# Patient Record
Sex: Male | Born: 1945
Health system: Southern US, Community
[De-identification: ages and names within clinical notes are randomized; demographics above are authoritative.]

## PROBLEM LIST (undated history)

## (undated) DIAGNOSIS — J84112 Idiopathic pulmonary fibrosis: Secondary | ICD-10-CM

## (undated) DIAGNOSIS — E785 Hyperlipidemia, unspecified: Secondary | ICD-10-CM

## (undated) DIAGNOSIS — Z8619 Personal history of other infectious and parasitic diseases: Secondary | ICD-10-CM

## (undated) DIAGNOSIS — I10 Essential (primary) hypertension: Secondary | ICD-10-CM

## (undated) DIAGNOSIS — M1990 Unspecified osteoarthritis, unspecified site: Secondary | ICD-10-CM

## (undated) DIAGNOSIS — M810 Age-related osteoporosis without current pathological fracture: Secondary | ICD-10-CM

## (undated) DIAGNOSIS — T7840XA Allergy, unspecified, initial encounter: Secondary | ICD-10-CM

## (undated) DIAGNOSIS — K219 Gastro-esophageal reflux disease without esophagitis: Secondary | ICD-10-CM

## (undated) DIAGNOSIS — E1165 Type 2 diabetes mellitus with hyperglycemia: Secondary | ICD-10-CM

## (undated) DIAGNOSIS — Z794 Long term (current) use of insulin: Principal | ICD-10-CM

## (undated) DIAGNOSIS — I219 Acute myocardial infarction, unspecified: Secondary | ICD-10-CM

## (undated) DIAGNOSIS — E079 Disorder of thyroid, unspecified: Secondary | ICD-10-CM

## (undated) HISTORY — DX: Type 2 diabetes mellitus with hyperglycemia: E11.65

## (undated) HISTORY — DX: Allergy, unspecified, initial encounter: T78.40XA

## (undated) HISTORY — DX: Personal history of other infectious and parasitic diseases: Z86.19

## (undated) HISTORY — DX: Unspecified osteoarthritis, unspecified site: M19.90

## (undated) HISTORY — DX: Essential (primary) hypertension: I10

## (undated) HISTORY — DX: Hyperlipidemia, unspecified: E78.5

## (undated) HISTORY — DX: Age-related osteoporosis without current pathological fracture: M81.0

## (undated) HISTORY — DX: Idiopathic pulmonary fibrosis: J84.112

## (undated) HISTORY — DX: Gastro-esophageal reflux disease without esophagitis: K21.9

## (undated) HISTORY — DX: Acute myocardial infarction, unspecified: I21.9

## (undated) HISTORY — DX: Long term (current) use of insulin: Z79.4

---

## 1988-08-09 HISTORY — PX: SHOULDER SURGERY: SHX246

## 1990-08-09 LAB — HM COLONOSCOPY: HM Colonoscopy: NORMAL

## 1996-08-09 HISTORY — PX: EYE SURGERY: SHX253

## 2003-12-03 ENCOUNTER — Encounter: Admission: RE | Admit: 2003-12-03 | Discharge: 2004-03-02 | Payer: Self-pay | Admitting: Endocrinology

## 2004-06-08 ENCOUNTER — Ambulatory Visit: Payer: Self-pay | Admitting: Endocrinology

## 2004-09-04 ENCOUNTER — Ambulatory Visit: Payer: Self-pay | Admitting: Endocrinology

## 2004-09-08 ENCOUNTER — Ambulatory Visit: Payer: Self-pay | Admitting: Endocrinology

## 2010-04-03 ENCOUNTER — Encounter: Payer: Self-pay | Admitting: Internal Medicine

## 2010-09-08 NOTE — Letter (Signed)
Summary: Colonoscopy Letter  [facility]  [address] [place], [place] [address]   Phone: [phone]  Fax: [phone]      April 03, 2010 MRN: [id number]   KAYEN GRABEL [address] [place], [place]  [address]   Dear Mr. SWAMY,   According to your medical record, it is time for you to schedule a Colonoscopy. The American Cancer Society recommends this procedure as a method to detect early colon cancer. Patients with a family history of colon cancer, or a personal history of colon polyps or inflammatory bowel disease are at increased risk.  This letter has been generated based on the recommendations made at the time of your procedure. If you feel that in your particular situation this may no longer apply, please contact our office.  Please call our office at [phone] to schedule this appointment or to update your records at your earliest convenience.  Thank you for cooperating with [place] to provide you with the very best care possible.   Sincerely,  Wilhemina Bonito. Marina Goodell, M.D.  [facility] [phone]

## 2011-01-27 ENCOUNTER — Emergency Department (HOSPITAL_BASED_OUTPATIENT_CLINIC_OR_DEPARTMENT_OTHER)
Admission: EM | Admit: 2011-01-27 | Discharge: 2011-01-27 | Disposition: A | Discharge status: Home / Self Care | Payer: Self-pay | Attending: Emergency Medicine | Admitting: Emergency Medicine

## 2011-01-27 DIAGNOSIS — Z79899 Other long term (current) drug therapy: Secondary | ICD-10-CM | POA: Insufficient documentation

## 2011-01-27 DIAGNOSIS — E7800 Pure hypercholesterolemia, unspecified: Secondary | ICD-10-CM | POA: Insufficient documentation

## 2011-01-27 DIAGNOSIS — I10 Essential (primary) hypertension: Secondary | ICD-10-CM | POA: Insufficient documentation

## 2011-01-27 DIAGNOSIS — E119 Type 2 diabetes mellitus without complications: Secondary | ICD-10-CM | POA: Insufficient documentation

## 2011-01-27 DIAGNOSIS — Z760 Encounter for issue of repeat prescription: Secondary | ICD-10-CM | POA: Insufficient documentation

## 2011-02-16 ENCOUNTER — Ambulatory Visit: Payer: Self-pay | Admitting: Family

## 2011-02-22 ENCOUNTER — Ambulatory Visit (INDEPENDENT_AMBULATORY_CARE_PROVIDER_SITE_OTHER): Payer: Self-pay | Admitting: Family

## 2011-02-22 ENCOUNTER — Encounter: Payer: Self-pay | Admitting: Family

## 2011-02-22 VITALS — BP 140/90 | HR 60 | Temp 98.1°F | Resp 16 | Ht 69.0 in | Wt 176.0 lb

## 2011-02-22 DIAGNOSIS — J309 Allergic rhinitis, unspecified: Secondary | ICD-10-CM | POA: Insufficient documentation

## 2011-02-22 DIAGNOSIS — E119 Type 2 diabetes mellitus without complications: Secondary | ICD-10-CM

## 2011-02-22 DIAGNOSIS — I10 Essential (primary) hypertension: Secondary | ICD-10-CM | POA: Insufficient documentation

## 2011-02-22 DIAGNOSIS — E785 Hyperlipidemia, unspecified: Secondary | ICD-10-CM | POA: Insufficient documentation

## 2011-02-22 DIAGNOSIS — K219 Gastro-esophageal reflux disease without esophagitis: Secondary | ICD-10-CM | POA: Insufficient documentation

## 2011-02-22 NOTE — Assessment & Plan Note (Signed)
Overall this has improved for pt.  I suspect that it is due to his weight loss.

## 2011-02-22 NOTE — Patient Instructions (Addendum)
Complete your lab work on the first floor.  Follow up in 3 months for your welcome to Medicare Physical. Come fasting to this appointment.

## 2011-02-22 NOTE — Progress Notes (Signed)
Subjective:    Patient ID: Ryan [name], male    DOB: 10-Aug-1945, 65 y.o.   MRN: [id number]  HPI  Ryan [name] is a 65 yr old male who presents today to establish care.    1. DM2- Was following at the [place]. Was diagnosed about 12 yrs ago.  Reports that he has a family history of DM.  He reports that he checks his sugars frequently.  Fasting sugars often in the 200's.  Often 80-120 during the day.  He reports that his A1C was in the 7's in December.  Lantus 12 units AM and 12 units PM, he counts carbs 1 unit for 15 carbs. He reports that he had an episode of symptomatic hypoglycemia-  While mowing the lawn.  Last eye exam was 9/11, reports that this was negative for retinopathy.  He reports that his pneumovax is up to date.  Gets flu shot. Exercises regularly  2. Dizziness- notes that this happens when he tries to stand up quickly.  3. GERD-Reports that he was on a med for a while.  Improved since he lost weight. Reports + hx of hiatal hernia. Asymptomic.  4.  Seasonal allergies- mild, not bothering him.  Notes that this is worse at night.  Worse in October.  5. Hyperlipidemia- on simvastatin- report that his LDL was not at goal.      Review of Systems  Constitutional: Negative for fever.  HENT: Positive for rhinorrhea.   Eyes: Negative for visual disturbance.  Respiratory: Negative for shortness of breath.   Cardiovascular: Negative for chest pain, palpitations and leg swelling.  Gastrointestinal: Negative for nausea, vomiting and diarrhea.  Genitourinary: Positive for frequency. Negative for dysuria.  Musculoskeletal: Negative for myalgias and arthralgias.       Reports + arthritis.  Skin: Negative for rash.  Neurological: Negative for weakness and numbness.  Hematological: Negative for adenopathy.  Psychiatric/Behavioral:       Denies hx of depression, anxiety   Past Medical History  Diagnosis Date  . Diabetes mellitus   . History of chicken pox   . GERD (gastroesophageal reflux  disease)   . Allergy   . Hyperlipidemia   . Hypertension     History   Social History  . Marital Status: Married    Spouse Name: N/A    Number of Children: 1  . Years of Education: N/A   Occupational History  . Not on file.   Social History Main Topics  . Smoking status: Never Smoker   . Smokeless tobacco: Never Used  . Alcohol Use: No  . Drug Use: No  . Sexually Active: Not on file   Other Topics Concern  . Not on file   Social History Narrative   Regular exercise:  5 x weeklyCaffeine Use: 1 cups coffee daily.Retired from Librarian, academic.Married- One son and grandson- both live with themWife has ovarian cancer    Past Surgical History  Procedure Date  . Shoulder surgery 1990    right shoulder, torn rotator cuff.  . Eye surgery 1998    bilateral    Family History  Problem Relation Age of Onset  . Cancer Mother     cancer  . Heart disease Mother   . Hyperlipidemia Mother   . Hypertension Mother   . Hyperlipidemia Father   . Stroke Father   . Hypertension Father   . Diabetes Paternal Grandfather     No Known Allergies  No current outpatient prescriptions on file prior to visit.  BP 140/90  Pulse 60  Temp(Src) 98.1 F (36.7 C) (Oral)  Resp 16  Ht 5' 9"$  (1.753 m)  Wt 176 lb (79.833 kg)  BMI 25.99 kg/m2       Objective:   Physical Exam  Constitutional: He appears well-developed and well-nourished.  HENT:  Head: Normocephalic and atraumatic.  Eyes: Pupils are equal, round, and reactive to light.  Neck: Normal range of motion. Neck supple. No thyromegaly present.  Cardiovascular: Normal rate and regular rhythm.   Pulmonary/Chest: Effort normal and breath sounds normal.  Abdominal: Soft. Bowel sounds are normal. He exhibits no distension and no mass. There is no tenderness. There is no rebound and no guarding.  Musculoskeletal: He exhibits no edema.  Skin: Skin is warm and dry.  Psychiatric: He has a normal mood and affect. His behavior is  normal. Judgment and thought content normal.          Assessment & Plan:

## 2011-02-22 NOTE — Assessment & Plan Note (Signed)
He reports that his symptoms are the worst in October.  Recommended to the patient that he start Claritin once daily in the end of September to see if this helps.

## 2011-02-22 NOTE — Assessment & Plan Note (Signed)
On ACE. Not at goal.  He reports that his BP at home is generally 120 or less systolic.  Will continue current meds and repeat next visit.

## 2011-02-22 NOTE — Assessment & Plan Note (Signed)
Seems well controlled at this point.  I have ordered a BMET and A1C and given slips to the patient. He will check cost and try to have these drawn soon. He is currently uninsured, but plans to be on Medicare in October.

## 2011-03-01 ENCOUNTER — Telehealth: Payer: Self-pay | Admitting: Family

## 2011-03-01 NOTE — Telephone Encounter (Signed)
Notified pt's wife and she states she will bring pt to the lab this week.

## 2011-03-01 NOTE — Telephone Encounter (Signed)
Pls call pt and remind him to complete lab work on the first floor at his earliest convenience.

## 2011-03-03 LAB — BASIC METABOLIC PANEL
BUN: 19 mg/dL (ref 6–23)
CO2: 25 mEq/L (ref 19–32)
Calcium: 9.7 mg/dL (ref 8.4–10.5)
Chloride: 101 mEq/L (ref 96–112)
Creat: 1.04 mg/dL (ref 0.50–1.35)
Glucose, Bld: 261 mg/dL — ABNORMAL HIGH (ref 70–99)
Potassium: 4.3 mEq/L (ref 3.5–5.3)
Sodium: 138 mEq/L (ref 135–145)

## 2011-03-03 LAB — HEMOGLOBIN A1C
Hgb A1c MFr Bld: 8.9 % — ABNORMAL HIGH (ref <5.7)
Mean Plasma Glucose: 209 mg/dL — ABNORMAL HIGH (ref <117)

## 2011-03-07 ENCOUNTER — Telehealth: Payer: Self-pay | Admitting: Family

## 2011-03-07 NOTE — Telephone Encounter (Signed)
Pls call pt.  Let him know that his A1C is high (8.9).  I would like him to increase his lantus to 15 units BID. Work hard on diabetic diet and exercise. Call if he develops sugars <80 or >300.

## 2011-03-08 NOTE — Telephone Encounter (Signed)
Pt notified and voices understanding. 

## 2011-03-10 ENCOUNTER — Telehealth: Payer: Self-pay | Admitting: *Deleted

## 2011-03-10 NOTE — Telephone Encounter (Signed)
Records received from the [place] in [place] and forwarded to Provider for review.

## 2011-05-10 ENCOUNTER — Other Ambulatory Visit: Payer: Self-pay | Admitting: *Deleted

## 2011-05-10 NOTE — Telephone Encounter (Signed)
Pt returned my call and states that he is currently using the [employer]tra [employer] glucometer and checks his glucose 6 times daily (before each meal and 2 hours after). He reports that he has always been told to check it this way since he was diagnosed. Form has been forwarded to Provider for completion.

## 2011-05-10 NOTE — Telephone Encounter (Signed)
Received fax from [facility] requesting order for diabetic testing supplies. Left message for pt to return my call. How many times a day is pt testing? Does pt currently have a glucometer?

## 2011-05-11 NOTE — Telephone Encounter (Signed)
Form has been completed.

## 2011-05-25 ENCOUNTER — Encounter: Payer: Self-pay | Admitting: Family

## 2011-05-25 ENCOUNTER — Ambulatory Visit (INDEPENDENT_AMBULATORY_CARE_PROVIDER_SITE_OTHER): Payer: Medicare Other | Admitting: Family

## 2011-05-25 ENCOUNTER — Telehealth: Payer: Self-pay | Admitting: Family

## 2011-05-25 DIAGNOSIS — Z0000 Encounter for general adult medical examination without abnormal findings: Secondary | ICD-10-CM

## 2011-05-25 DIAGNOSIS — Z1211 Encounter for screening for malignant neoplasm of colon: Secondary | ICD-10-CM

## 2011-05-25 DIAGNOSIS — E785 Hyperlipidemia, unspecified: Secondary | ICD-10-CM

## 2011-05-25 DIAGNOSIS — E119 Type 2 diabetes mellitus without complications: Secondary | ICD-10-CM

## 2011-05-25 DIAGNOSIS — Z13820 Encounter for screening for osteoporosis: Secondary | ICD-10-CM

## 2011-05-25 DIAGNOSIS — Z23 Encounter for immunization: Secondary | ICD-10-CM

## 2011-05-25 DIAGNOSIS — R351 Nocturia: Secondary | ICD-10-CM

## 2011-05-25 DIAGNOSIS — I10 Essential (primary) hypertension: Secondary | ICD-10-CM

## 2011-05-25 LAB — LIPID PANEL
Cholesterol: 140 mg/dL (ref 0–200)
HDL: 42 mg/dL (ref >39)
LDL Cholesterol: 86 mg/dL (ref 0–99)
Total CHOL/HDL Ratio: 3.3 Ratio
Triglycerides: 59 mg/dL (ref <150)
VLDL: 12 mg/dL (ref 0–40)

## 2011-05-25 LAB — BASIC METABOLIC PANEL
BUN: 14 mg/dL (ref 6–23)
CO2: 28 mEq/L (ref 19–32)
Calcium: 9.7 mg/dL (ref 8.4–10.5)
Chloride: 103 mEq/L (ref 96–112)
Creat: 0.99 mg/dL (ref 0.50–1.35)
Glucose, Bld: 50 mg/dL — ABNORMAL LOW (ref 70–99)
Potassium: 4.3 mEq/L (ref 3.5–5.3)
Sodium: 140 mEq/L (ref 135–145)

## 2011-05-25 LAB — HEPATIC FUNCTION PANEL
ALT: 17 U/L (ref 0–53)
AST: 22 U/L (ref 0–37)
Albumin: 4.3 g/dL (ref 3.5–5.2)
Alkaline Phosphatase: 61 U/L (ref 39–117)
Bilirubin, Direct: 0.3 mg/dL (ref 0.0–0.3)
Indirect Bilirubin: 1.3 mg/dL — ABNORMAL HIGH (ref 0.0–0.9)
Total Bilirubin: 1.6 mg/dL — ABNORMAL HIGH (ref 0.3–1.2)
Total Protein: 7 g/dL (ref 6.0–8.3)

## 2011-05-25 LAB — HEMOGLOBIN A1C
Hgb A1c MFr Bld: 8.2 % — ABNORMAL HIGH (ref <5.7)
Mean Plasma Glucose: 189 mg/dL — ABNORMAL HIGH (ref <117)

## 2011-05-25 LAB — PSA: PSA: 0.16 ng/mL (ref <=4.00)

## 2011-05-25 MED ORDER — FLUTICASONE PROPIONATE 50 MCG/ACT NA SUSP: 2.0000 | Freq: Every day | NASAL | Status: DC

## 2011-05-25 NOTE — Progress Notes (Signed)
Subjective:    Patient ID: Ryan [name], male    DOB: Jan 15, 1946, 65 y.o.   MRN: [id number]  HPI  Subjective:   Patient here for Medicare annual wellness visit and management of other chronic and acute problems.  DM2- Lantus/novolog- he increased the lantus from 12-15 units last visit.  He reports that he often sees his sugar around 200 in the AM.  He is using novolog- 1 unit/15grams of carbs.  Walking regularly. He had his eye exam yesterday.    HTN- he is on lisinopril, motoprolol.  Tolerating without difficulty.    Left Foot pain-  Had soreness/swelling- now resolved.   Allergic rhinitis-Notes that he has a lot of congestion in October/november.  Tried zyrtec- no improvement.   Hyperlipidemia-  Simvastatin- tolerating without problems.    Risk factors:  At risk for CAD  Roster of Physicians Providing Medical Care to Patient: none Activities of Daily Living  In your present state of health, do you have any difficulty performing the following activities? Preparing food and eating?: No  Bathing yourself: No  Getting dressed: No  Using the toilet:No  Moving around from place to place: No  In the past year have you fallen or had a near fall?: He had a fall about 6-7 months ago.   Home Safety: Has smoke detector and wears seat belts. No firearms. No excess sun exposure- wears sunscreen.  Diet and Exercise  Current exercise habits: Walks several times a day. Dietary issues discussed: healthy diet   Depression Screen  (Note: if answer to either of the following is "Yes", then a more complete depression screening is indicated)  Q1: Over the past two weeks, have you felt down, depressed or hopeless?no  Q2: Over the past two weeks, have you felt little interest or pleasure in doing things? no   The following portions of the patient's history were reviewed and updated as appropriate: allergies, current medications, past family history, past medical history, past social history,  past surgical history and problem list.    Objective:   Vision: declines had vision testing yesterday Hearing: Able to hear forced whisper at 6 feet. Body mass index:see nursing.  Cognitive Impairment Assessment: cognition, memory and judgment appear normal.   Assessment:    1.  Diabetes- check A1C, adjust lantus.  If not close to goal will plan to refer to Endo.  2.  Hyperlipidemia- check FLP, LFT today, continue statin.  3.  HTN- will increase his lisinopril to 20mg  to try to get his bp to goal <130/80.  4.  Allergic rhinitis- not relieved by zyrtec.  Will give a trial of fluticasone.    BP Readings from Last 3 Encounters:  05/25/11 142/88  02/22/11 140/90     Plan:   During the course of the visit the patient was educated and counseled about appropriate screening and preventive services including:        Fall prevention   Bone densitometry screening- Ordered todayhas never had.   Colonoscopy- about 10 yrs ago.  Will refer. Nutrition counseling   Vaccines / LABS Pnemonccoal Vaccine  Today, flu shot today.   PSA EKG today- personally reviewed and notes sinus bradycardia without ST changes.  Patient Instructions (the written plan) was given to the patient.   Review of Systems  Constitutional: Negative for fever and unexpected weight change.  HENT: Negative for hearing loss.   Eyes: Negative for visual disturbance.  Respiratory: Negative for shortness of breath.   Cardiovascular: Negative for  chest pain.  Gastrointestinal: Negative for nausea, vomiting, diarrhea and blood in stool.  Genitourinary: Negative for dysuria and hematuria.  Musculoskeletal: Positive for arthralgias. Negative for myalgias.  Skin: Negative for rash.  Neurological: Negative for weakness, numbness and headaches.  Hematological: Negative for adenopathy.  Psychiatric/Behavioral:       Denies depression, notes increased family stress       Objective:   Physical Exam  Constitutional: He is  oriented to person, place, and time. He appears well-developed and well-nourished.  HENT:  Head: Normocephalic and atraumatic.  Right Ear: Tympanic membrane and ear canal normal.  Left Ear: Tympanic membrane and ear canal normal.  Mouth/Throat: Uvula is midline, oropharynx is clear and moist and mucous membranes are normal.  Eyes: Conjunctivae are normal. Pupils are equal, round, and reactive to light. No scleral icterus.  Neck: Normal range of motion. Neck supple. No thyromegaly present.  Cardiovascular: Normal rate and regular rhythm.   No murmur heard. Abdominal: Soft. Bowel sounds are normal. He exhibits no distension and no mass. There is no tenderness. There is no rebound and no guarding.  Genitourinary: Guaiac negative stool.       Prostate is smooth and slightly enlarged without palpable nodules/irregularity  Musculoskeletal: Normal range of motion. He exhibits no edema.  Lymphadenopathy:    He has no cervical adenopathy.  Neurological: He is alert and oriented to person, place, and time. He displays normal reflexes. He exhibits normal muscle tone. Coordination normal.  Skin: Skin is warm and dry. No erythema.  Psychiatric: He has a normal mood and affect. His behavior is normal. Judgment and thought content normal.          Assessment & Plan:

## 2011-05-25 NOTE — Telephone Encounter (Signed)
Pls call patient and let him know that I have reviewed his medications and would like for him to increase his lisinopril from a 1/2 tablet once daily to a full tablet once daily.

## 2011-05-25 NOTE — Patient Instructions (Addendum)
Check with your insurance to see if they will cover the Zostavax (shingles vaccine) and call to schedule a nurse visit. Schedule your bone density at the front desk. You will be contacted about your referral for colonoscopy. Please complete your blood work on the first floor. Follow up in 3 months.

## 2011-05-26 ENCOUNTER — Telehealth: Payer: Self-pay | Admitting: Family

## 2011-05-26 LAB — MICROALBUMIN / CREATININE URINE RATIO
Creatinine, Urine: 283.8 mg/dL
Microalb Creat Ratio: 5.3 mg/g (ref 0.0–30.0)
Microalb, Ur: 1.49 mg/dL (ref 0.00–1.89)

## 2011-05-26 NOTE — Telephone Encounter (Signed)
Pt notified. Lab order entered for LFTs and forwarded to the lab for the week of 06/21/11. Pt will complete additional lab workup by Friday. Order has been forwarded to the lab.  Pt requests results of hgb a1c. Please advise.

## 2011-05-26 NOTE — Telephone Encounter (Signed)
Left message on machine to return my call. 

## 2011-05-26 NOTE — Telephone Encounter (Signed)
Pls call patient and let him know that one of his liver tests is elevated.  I would like for him to complete some additional lab work and an ultrasound of his liver.  Also, he should hold his simvastatin and plan to repeat his LFT's in 1 month.

## 2011-05-27 ENCOUNTER — Encounter: Payer: Self-pay | Admitting: Internal Medicine

## 2011-05-27 ENCOUNTER — Ambulatory Visit (INDEPENDENT_AMBULATORY_CARE_PROVIDER_SITE_OTHER)
Admission: RE | Admit: 2011-05-27 | Discharge: 2011-05-27 | Disposition: A | Discharge status: Home / Self Care | Payer: Medicare Other | Source: Ambulatory Visit

## 2011-05-27 DIAGNOSIS — Z13820 Encounter for screening for osteoporosis: Secondary | ICD-10-CM

## 2011-05-27 NOTE — Telephone Encounter (Signed)
Pt.notified

## 2011-05-28 ENCOUNTER — Other Ambulatory Visit (HOSPITAL_BASED_OUTPATIENT_CLINIC_OR_DEPARTMENT_OTHER): Payer: Medicare Other

## 2011-05-31 ENCOUNTER — Ambulatory Visit (HOSPITAL_BASED_OUTPATIENT_CLINIC_OR_DEPARTMENT_OTHER)
Admission: RE | Admit: 2011-05-31 | Discharge: 2011-05-31 | Disposition: A | Discharge status: Home / Self Care | Payer: Medicare Other | Source: Ambulatory Visit | Attending: Family | Admitting: Family

## 2011-05-31 DIAGNOSIS — E119 Type 2 diabetes mellitus without complications: Secondary | ICD-10-CM

## 2011-05-31 DIAGNOSIS — R17 Unspecified jaundice: Secondary | ICD-10-CM | POA: Insufficient documentation

## 2011-05-31 DIAGNOSIS — D739 Disease of spleen, unspecified: Secondary | ICD-10-CM

## 2011-05-31 LAB — IRON AND TIBC
%SAT: 22 % (ref 20–55)
Iron: 71 ug/dL (ref 42–165)
TIBC: 330 ug/dL (ref 215–435)
UIBC: 259 ug/dL (ref 125–400)

## 2011-05-31 LAB — HEPATITIS C ANTIBODY: HCV Ab: NEGATIVE

## 2011-05-31 LAB — HEPATITIS B SURFACE ANTIGEN: Hepatitis B Surface Ag: NEGATIVE

## 2011-05-31 LAB — HEPATITIS B SURFACE ANTIBODY,QUALITATIVE: Hep B S Ab: NEGATIVE

## 2011-05-31 LAB — FERRITIN: Ferritin: 166 ng/mL (ref 22–322)

## 2011-06-01 ENCOUNTER — Telehealth: Payer: Self-pay | Admitting: *Deleted

## 2011-06-01 LAB — CERULOPLASMIN: Ceruloplasmin: 26 mg/dL (ref 20–60)

## 2011-06-01 NOTE — Telephone Encounter (Signed)
Received message from Adena at [facility] to call re: hepatic function panel order for 05/26/11.  Called and spoke to Buell, she states test has been cancelled as test had resulted on 05/25/11. Advised her that the 05/26/11 order was a future requisition for 06/23/11 repeat hepatic function panel. She advised me order will need to be replaced. Order placed and forwarded to the lab.

## 2011-06-01 NOTE — Telephone Encounter (Signed)
Please advise re: hgb a1c result.

## 2011-06-02 ENCOUNTER — Encounter: Payer: Self-pay | Admitting: Internal Medicine

## 2011-06-02 ENCOUNTER — Ambulatory Visit (AMBULATORY_SURGERY_CENTER): Payer: Medicare Other | Admitting: *Deleted

## 2011-06-02 VITALS — Ht 70.0 in | Wt 180.4 lb

## 2011-06-02 DIAGNOSIS — Z1211 Encounter for screening for malignant neoplasm of colon: Secondary | ICD-10-CM

## 2011-06-02 MED ORDER — MOVIPREP 100 G PO SOLR: ORAL | Status: DC

## 2011-06-08 ENCOUNTER — Telehealth: Payer: Self-pay

## 2011-06-08 ENCOUNTER — Encounter: Payer: Self-pay | Admitting: Family

## 2011-06-08 ENCOUNTER — Telehealth: Payer: Self-pay | Admitting: Internal Medicine

## 2011-06-08 ENCOUNTER — Other Ambulatory Visit: Payer: Self-pay

## 2011-06-08 NOTE — Telephone Encounter (Signed)
Called Moviprep RX #1/ no refills into [facility] in [place] [phone]) per patients request. Pt notified. Ulis Rias RN

## 2011-06-08 NOTE — Telephone Encounter (Signed)
See phone note from J Breece to pt.  Pt needed prep resent to pharmacy and Ulis Rias sent moviprep to pharmacy.  Pt notified.

## 2011-06-08 NOTE — Telephone Encounter (Signed)
Called Moviprep Rx #1/ no refills, take as directed, into the [facility] in [place] [phone])  per patients request. Pt notified .Ulis Rias RN

## 2011-06-09 ENCOUNTER — Telehealth: Payer: Self-pay | Admitting: Family

## 2011-06-09 ENCOUNTER — Telehealth: Payer: Self-pay | Admitting: Internal Medicine

## 2011-06-09 DIAGNOSIS — M8580 Other specified disorders of bone density and structure, unspecified site: Secondary | ICD-10-CM | POA: Insufficient documentation

## 2011-06-09 NOTE — Telephone Encounter (Signed)
Would like results of recent sonogram

## 2011-06-09 NOTE — Telephone Encounter (Signed)
Called patient. Reviewed results of ultrasound and dexa.  Recommended that he add a calcium supplement bid and get regular weight bearing exercise.

## 2011-06-11 ENCOUNTER — Other Ambulatory Visit: Payer: Medicare Other | Admitting: Internal Medicine

## 2011-06-15 NOTE — Telephone Encounter (Signed)
done

## 2011-06-22 ENCOUNTER — Telehealth: Payer: Self-pay | Admitting: Family

## 2011-06-22 ENCOUNTER — Encounter: Payer: Self-pay | Admitting: Family

## 2011-06-22 MED ORDER — METOPROLOL TARTRATE 50 MG PO TABS: 25.0000 mg | ORAL_TABLET | Freq: Two times a day (BID) | ORAL | Status: DC

## 2011-06-22 MED ORDER — LISINOPRIL 10 MG PO TABS: 5.0000 mg | ORAL_TABLET | Freq: Every day | ORAL | Status: DC

## 2011-06-22 NOTE — Telephone Encounter (Signed)
Per last office note, we were going to increase pt's lisinopril to 20mg  daily but I do not see that Rx was sent to pharmacy with new directions. Pt is requesting refill on 10mg  ( take 1/2 tablet daily). Please advise what pt should be taking.

## 2011-06-23 ENCOUNTER — Telehealth: Payer: Self-pay | Admitting: Family

## 2011-06-23 DIAGNOSIS — R7989 Other specified abnormal findings of blood chemistry: Secondary | ICD-10-CM

## 2011-06-23 MED ORDER — LISINOPRIL 10 MG PO TABS: 10.0000 mg | ORAL_TABLET | Freq: Every day | ORAL | Status: DC

## 2011-06-23 NOTE — Telephone Encounter (Signed)
Spoke with pt, clarified that dose of lisinopril should be 10mg  once daily.  He continues to have his statin on hold. Trish could you pls send LFT order to lab (elevated LFT's).  He will come in the next 1-2 weeks to complete. Thanks.

## 2011-06-23 NOTE — Telephone Encounter (Signed)
Order was placed on 06/01/11 for blood draw this week (still open). Order is in the lab.

## 2011-06-23 NOTE — Telephone Encounter (Signed)
I reviewed his chart.  He was taking 5mg  of lisinopril once daily. He should be increased to 10mg  (a full tablet once daily).

## 2011-06-29 NOTE — Telephone Encounter (Signed)
Pt returned my call and states he will complete labs tomorrow.

## 2011-06-29 NOTE — Telephone Encounter (Signed)
Left message on machine to return my call. 

## 2011-07-01 LAB — HEPATIC FUNCTION PANEL
ALT: 15 U/L (ref 0–53)
AST: 18 U/L (ref 0–37)
Albumin: 4.3 g/dL (ref 3.5–5.2)
Alkaline Phosphatase: 57 U/L (ref 39–117)
Bilirubin, Direct: 0.2 mg/dL (ref 0.0–0.3)
Indirect Bilirubin: 0.9 mg/dL (ref 0.0–0.9)
Total Bilirubin: 1.1 mg/dL (ref 0.3–1.2)
Total Protein: 6.8 g/dL (ref 6.0–8.3)

## 2011-07-02 ENCOUNTER — Encounter: Payer: Self-pay | Admitting: Family

## 2011-07-05 ENCOUNTER — Telehealth: Payer: Self-pay | Admitting: Family

## 2011-07-05 DIAGNOSIS — R7989 Other specified abnormal findings of blood chemistry: Secondary | ICD-10-CM

## 2011-07-05 DIAGNOSIS — R7401 Elevation of levels of liver transaminase levels: Secondary | ICD-10-CM

## 2011-07-05 NOTE — Telephone Encounter (Signed)
Future lab order placed for the week of 07/26/11 and preliminary order forwarded to the lab.

## 2011-07-05 NOTE — Telephone Encounter (Signed)
Called pt.  Reviewed normal LFT's.  Recommended that he restart simvastatin 80mg  at 1/2 tab once daily and follow up in 1 month for LFT's in the lab.  Diagnosis elevated LFTs. Trish could you pls send order to lab? thanks

## 2011-07-13 ENCOUNTER — Encounter: Payer: Self-pay | Admitting: Family

## 2011-07-13 ENCOUNTER — Ambulatory Visit (HOSPITAL_BASED_OUTPATIENT_CLINIC_OR_DEPARTMENT_OTHER)
Admission: RE | Admit: 2011-07-13 | Discharge: 2011-07-13 | Disposition: A | Discharge status: Home / Self Care | Payer: Medicare Other | Source: Ambulatory Visit | Attending: Family | Admitting: Family

## 2011-07-13 ENCOUNTER — Telehealth: Payer: Self-pay | Admitting: Family

## 2011-07-13 ENCOUNTER — Ambulatory Visit (INDEPENDENT_AMBULATORY_CARE_PROVIDER_SITE_OTHER): Payer: Medicare Other | Admitting: Family

## 2011-07-13 VITALS — BP 120/70 | HR 78 | Temp 98.0°F | Ht 69.0 in | Wt 173.0 lb

## 2011-07-13 DIAGNOSIS — R059 Cough, unspecified: Secondary | ICD-10-CM

## 2011-07-13 DIAGNOSIS — E119 Type 2 diabetes mellitus without complications: Secondary | ICD-10-CM | POA: Insufficient documentation

## 2011-07-13 DIAGNOSIS — J209 Acute bronchitis, unspecified: Secondary | ICD-10-CM

## 2011-07-13 DIAGNOSIS — I10 Essential (primary) hypertension: Secondary | ICD-10-CM | POA: Insufficient documentation

## 2011-07-13 DIAGNOSIS — R05 Cough: Secondary | ICD-10-CM

## 2011-07-13 DIAGNOSIS — R6883 Chills (without fever): Secondary | ICD-10-CM

## 2011-07-13 DIAGNOSIS — R0989 Other specified symptoms and signs involving the circulatory and respiratory systems: Secondary | ICD-10-CM | POA: Insufficient documentation

## 2011-07-13 MED ORDER — ALBUTEROL SULFATE HFA 108 (90 BASE) MCG/ACT IN AERS: 2.0000 | INHALATION_SPRAY | Freq: Four times a day (QID) | RESPIRATORY_TRACT | Status: DC | PRN

## 2011-07-13 MED ORDER — FLUTICASONE PROPIONATE (INHAL) 250 MCG/BLIST IN AEPB: 1.0000 | INHALATION_SPRAY | Freq: Two times a day (BID) | RESPIRATORY_TRACT | Status: DC

## 2011-07-13 MED ORDER — AZITHROMYCIN 250 MG PO TABS: ORAL_TABLET | ORAL | Status: AC

## 2011-07-13 NOTE — Telephone Encounter (Signed)
Pls call pt and let him know that his chest x-ray is negative for pneumonia. I would like for him to start zithromax in addition to the flovent and albuterol. This has been sent down to the pharmacy.

## 2011-07-13 NOTE — Progress Notes (Signed)
Subjective:    Patient ID: Ryan [name], male    DOB: [birth year], 65 y.o.   MRN: [id number]  HPI  [name] is a 65 yr old male who presents today with chief complaint of cough.  Cough started on Thursday evening 11/29.  Has been in bed.  He denies associated fever but did not take his temperature. Also denies significant nasal congestion or shortness of breath.   Cough is decribes as dry and hacking.  Reports + couging fits. He reports associated poor energy and anorexia.   Overall symptoms are slightly better as he is "up" today.       Review of Systems    see HPI  Past Medical History  Diagnosis Date  . Diabetes mellitus   . History of chicken pox   . GERD (gastroesophageal reflux disease)   . Allergy   . Hyperlipidemia   . Hypertension     History   Social History  . Marital Status: Married    Spouse Name: N/A    Number of Children: 1  . Years of Education: N/A   Occupational History  . Not on file.   Social History Main Topics  . Smoking status: Former Smoker    Types: Cigarettes    Quit date: 09/01/1965  . Smokeless tobacco: Never Used  . Alcohol Use: Not on file  . Drug Use: No  . Sexually Active: Not on file   Other Topics Concern  . Not on file   Social History Narrative   Regular exercise:  5 x weeklyCaffeine Use: 1 cups coffee daily.Retired from Libr[employer], academic.Married- One son and grandson- both live with themWife has ovarian cancer    Past Surgical History  Procedure Date  . Shoulder surgery 1990    right shoulder, torn rotator cuff.  . Eye surgery 1998    bilateral    Family History  Problem Relation Age of Onset  . Cancer Mother     cancer  . Heart disease Mother   . Hyperlipidemia Mother   . Hypertension Mother   . Hyperlipidemia Father   . Stroke Father   . Hypertension Father   . Diabetes Paternal Grandfather     No Known Allergies  Current Outpatient Prescriptions on File Prior to Visit  Medication Sig Dispense Refill    . aspirin 81 MG tablet Take 81 mg by mouth daily.        . Calcium Carbonate-Vitamin D (CALTRATE 600+D) 600-400 MG-UNIT per tablet Take 1 tablet by mouth 2 (two) times daily.        . fluticasone (FLONASE) 50 MCG/ACT nasal spray Place 2 sprays into the nose daily.  16 g  2  . insulin aspart (NOVOLOG) 100 UNIT/ML injection Inject 8 Units into the skin 3 (three) times daily before meals. On a sliding scale.       . insulin glargine (LANTUS) 100 UNIT/ML injection Inject 18 Units into the skin 2 (two) times daily.       Marland Kitchen lisinopril (PRINIVIL,ZESTRIL) 10 MG tablet Take 1 tablet (10 mg total) by mouth daily.  90 tablet  0  . metoprolol (LOPRESSOR) 50 MG tablet Take 0.5 tablets (25 mg total) by mouth 2 (two) times daily. .  90 tablet  0  . simvastatin (ZOCOR) 80 MG tablet Take 80 mg by mouth daily.        Marland Kitchen MOVIPREP 100 G SOLR movi prep as directed   1 each  0    BP  120/70  Pulse 78  Temp(Src) 98 F (36.7 C) (Oral)  Ht 5' 9"$  (1.753 m)  Wt 173 lb 0.6 oz (78.49 kg)  BMI 25.55 kg/m2  SpO2 96%    Objective:   Physical Exam  Constitutional: He is oriented to person, place, and time. He appears well-developed and well-nourished.  HENT:  Head: Normocephalic and atraumatic.  Right Ear: Tympanic membrane and ear canal normal.  Left Ear: Tympanic membrane and ear canal normal.  Mouth/Throat: Posterior oropharyngeal erythema present. No oropharyngeal exudate, posterior oropharyngeal edema or tonsillar abscesses.  Eyes: Conjunctivae are normal. No scleral icterus.  Cardiovascular: Normal rate, regular rhythm, S1 normal and S2 normal.   Pulmonary/Chest: No respiratory distress. He has no decreased breath sounds. He has wheezes in the right upper field. He has no rhonchi. He has no rales.  Neurological: He is alert and oriented to person, place, and time.          Assessment & Plan:

## 2011-07-13 NOTE — Assessment & Plan Note (Signed)
CXR performed today is negative for pneumonia. + mild wheezing on exam today. Will add flovent and albuterol MDI, start zithromax, follow up in 1 week. See phone note.

## 2011-07-13 NOTE — Patient Instructions (Signed)
Complete your chest x-ray on the first floor today- we will call you with the results.  Follow up in 1 week, call sooner if symptoms worsen, if fever >101, or if you are not feeling better in 2-3 days.

## 2011-07-13 NOTE — Telephone Encounter (Signed)
Patient informed. 

## 2011-07-22 ENCOUNTER — Encounter: Payer: Self-pay | Admitting: Family

## 2011-07-22 MED ORDER — INSULIN ASPART 100 UNIT/ML ~~LOC~~ SOLN: 8.0000 [IU] | Freq: Three times a day (TID) | SUBCUTANEOUS | Status: DC

## 2011-07-22 MED ORDER — INSULIN GLARGINE 100 UNIT/ML ~~LOC~~ SOLN: 18.0000 [IU] | Freq: Two times a day (BID) | SUBCUTANEOUS | Status: DC

## 2011-07-26 ENCOUNTER — Telehealth: Payer: Self-pay | Admitting: Internal Medicine

## 2011-07-26 NOTE — Telephone Encounter (Signed)
No charge. 

## 2011-07-28 ENCOUNTER — Other Ambulatory Visit: Payer: Medicare Other | Admitting: Internal Medicine

## 2011-08-12 ENCOUNTER — Telehealth: Payer: Self-pay | Admitting: Family

## 2011-08-12 MED ORDER — INSULIN ASPART 100 UNIT/ML ~~LOC~~ SOLN: 8.0000 [IU] | Freq: Three times a day (TID) | SUBCUTANEOUS | Status: DC

## 2011-08-12 MED ORDER — INSULIN GLARGINE 100 UNIT/ML ~~LOC~~ SOLN: 18.0000 [IU] | Freq: Two times a day (BID) | SUBCUTANEOUS | Status: DC

## 2011-08-12 NOTE — Telephone Encounter (Signed)
Refills sent to pharmacy. #4 vials novolog and #3 vials lantus x no refills.

## 2011-08-12 NOTE — Telephone Encounter (Signed)
Refill- novolog 10ml vial. Qty 10 Inject 8 units into the skin 3 times daily before meals. On a sliding scale.  Pharmacy comments: patient previosly getting 4 vials. Is this okay?  Refill- lantus 100 units/ml vial. Qty 10 inject 18 units into the skin 2 times daily.   Pharmacy comments: patient previously getting 3 vials.-71month supply. Is this okay? Please advise.

## 2011-08-18 ENCOUNTER — Telehealth: Payer: Self-pay | Admitting: Internal Medicine

## 2011-08-20 ENCOUNTER — Encounter: Payer: Medicare Other | Admitting: Internal Medicine

## 2011-08-25 ENCOUNTER — Ambulatory Visit (INDEPENDENT_AMBULATORY_CARE_PROVIDER_SITE_OTHER): Payer: Medicare Other | Admitting: Family

## 2011-08-25 ENCOUNTER — Encounter: Payer: Self-pay | Admitting: Family

## 2011-08-25 DIAGNOSIS — E785 Hyperlipidemia, unspecified: Secondary | ICD-10-CM

## 2011-08-25 DIAGNOSIS — E119 Type 2 diabetes mellitus without complications: Secondary | ICD-10-CM

## 2011-08-25 DIAGNOSIS — Z23 Encounter for immunization: Secondary | ICD-10-CM

## 2011-08-25 DIAGNOSIS — Z2911 Encounter for prophylactic immunotherapy for respiratory syncytial virus (RSV): Secondary | ICD-10-CM

## 2011-08-25 DIAGNOSIS — J309 Allergic rhinitis, unspecified: Secondary | ICD-10-CM

## 2011-08-25 DIAGNOSIS — I10 Essential (primary) hypertension: Secondary | ICD-10-CM

## 2011-08-25 LAB — HEPATIC FUNCTION PANEL
ALT: 15 U/L (ref 0–53)
AST: 16 U/L (ref 0–37)
Albumin: 4.2 g/dL (ref 3.5–5.2)
Alkaline Phosphatase: 51 U/L (ref 39–117)
Bilirubin, Direct: 0.2 mg/dL (ref 0.0–0.3)
Indirect Bilirubin: 0.7 mg/dL (ref 0.0–0.9)
Total Bilirubin: 0.9 mg/dL (ref 0.3–1.2)
Total Protein: 7.1 g/dL (ref 6.0–8.3)

## 2011-08-25 LAB — BASIC METABOLIC PANEL WITH GFR
BUN: 14 mg/dL (ref 6–23)
CO2: 24 meq/L (ref 19–32)
Calcium: 9.6 mg/dL (ref 8.4–10.5)
Chloride: 104 meq/L (ref 96–112)
Creat: 0.86 mg/dL (ref 0.50–1.35)
Glucose, Bld: 220 mg/dL — ABNORMAL HIGH (ref 70–99)
Potassium: 5.2 meq/L (ref 3.5–5.3)
Sodium: 142 meq/L (ref 135–145)

## 2011-08-25 MED ORDER — MONTELUKAST SODIUM 10 MG PO TABS: 10.0000 mg | ORAL_TABLET | Freq: Every day | ORAL | Status: DC

## 2011-08-25 MED ORDER — INSULIN DETEMIR 100 UNIT/ML ~~LOC~~ SOLN: 18.0000 [IU] | Freq: Every day | SUBCUTANEOUS | Status: DC

## 2011-08-25 NOTE — Assessment & Plan Note (Signed)
LDL was at goal 86 in October 2012.  Continue statin, obtain LFT's.

## 2011-08-25 NOTE — Progress Notes (Signed)
Subjective:    Patient ID: Ryan [name], male    DOB: 08-28-1945, 66 y.o.   MRN: [id number]  HPI  Mr.  Ryan [name] is a 66 yr old male who presents today for follow up.  1) Diabetes-  Reports that his sugar was 160 this AM.  He reports that he had sugar 75 yesterday.  Denies feeling shakey.  He continues lantus 18 units bid.  He uses the novolog based on carbs- he is using 1 unit of insulin for 15 carbs.  Copay went up to 70$ a month on lantus, previously 30 dollars.    2) HTN- He continues lisinopril and Metoprolol.  Nothing has changed.  Denies chest pain, shortness of breath or swelling.   3) Cough- Only coughing at night when he lays down.  + post-nasal drip despite use of zyrtec and flonase.  Denies GERD symptoms.  He does report + Hiatal hernia- asymptomatic.   4) Hyperlipidemia- he is continuing on simvastatin 40 mg without any problems.       Review of Systems See HPI  Past Medical History  Diagnosis Date  . Diabetes mellitus   . History of chicken pox   . GERD (gastroesophageal reflux disease)   . Allergy   . Hyperlipidemia   . Hypertension     History   Social History  . Marital Status: Married    Spouse Name: Ryan [name]    Number of Children: 1  . Years of Education: Ryan [name]   Occupational History  . Not on file.   Social History Main Topics  . Smoking status: Former Smoker    Types: Cigarettes    Quit date: 09/01/1965  . Smokeless tobacco: Never Used  . Alcohol Use: Not on file  . Drug Use: No  . Sexually Active: Not on file   Other Topics Concern  . Not on file   Social History Narrative   Regular exercise:  5 x weeklyCaffeine Use: 1 cups coffee daily.Retired from Building control surveyor.Married- One son and grandson- both live with themWife has ovarian cancer    Past Surgical History  Procedure Date  . Shoulder surgery 1990    right shoulder, torn rotator cuff.  . Eye surgery 1998    bilateral    Family History  Problem Relation Age of Onset  . Cancer  Mother     cancer  . Heart disease Mother   . Hyperlipidemia Mother   . Hypertension Mother   . Hyperlipidemia Father   . Stroke Father   . Hypertension Father   . Diabetes Paternal Grandfather     No Known Allergies  Current Outpatient Prescriptions on File Prior to Visit  Medication Sig Dispense Refill  . albuterol (PROVENTIL HFA;VENTOLIN HFA) 108 (90 BASE) MCG/ACT inhaler Inhale 2 puffs into the lungs every 6 (six) hours as needed for wheezing.  3.7 g  0  . aspirin 81 MG tablet Take 81 mg by mouth daily.        . Calcium Carbonate-Vitamin D (CALTRATE 600+D) 600-400 MG-UNIT per tablet Take 1 tablet by mouth 2 (two) times daily.        . fluticasone (FLONASE) 50 MCG/ACT nasal spray Place 2 sprays into the nose daily.  16 g  2  . Fluticasone Propionate, Inhal, (FLOVENT DISKUS) 250 MCG/BLIST AEPB Inhale 1 puff into the lungs 2 (two) times daily.  28 each  0  . insulin aspart (NOVOLOG) 100 UNIT/ML injection Inject 8 Units into the skin 3 (three) times daily before  meals. On a sliding scale.  4 vial  0  . insulin glargine (LANTUS) 100 UNIT/ML injection Inject 18 Units into the skin 2 (two) times daily.  30 mL  0  . lisinopril (PRINIVIL,ZESTRIL) 10 MG tablet Take 1 tablet (10 mg total) by mouth daily.  90 tablet  0  . metoprolol (LOPRESSOR) 50 MG tablet Take 0.5 tablets (25 mg total) by mouth 2 (two) times daily. .  90 tablet  0  . MOVIPREP 100 G SOLR movi prep as directed   1 each  0  . simvastatin (ZOCOR) 80 MG tablet Take 80 mg by mouth daily.          BP 130/80  Pulse 56  Temp(Src) 98.2 F (36.8 C) (Oral)  Resp 16  Ht 5\' 9"  (1.753 m)  Wt 178 lb 1.3 oz (80.777 kg)  BMI 26.30 kg/m2       Objective:   Physical Exam  Constitutional: He is oriented to person, place, and time. He appears well-developed and well-nourished. No distress.  Eyes: No scleral icterus.  Cardiovascular: Normal rate and regular rhythm.   No murmur heard. Pulmonary/Chest: Effort normal and breath sounds  normal. No respiratory distress. He has no wheezes. He has no rales. He exhibits no tenderness.  Musculoskeletal: He exhibits no edema.  Neurological: He is alert and oriented to person, place, and time.  Skin: Skin is warm and dry.  Psychiatric: He has a normal mood and affect. His behavior is normal. Judgment and thought content normal.          Assessment & Plan:

## 2011-08-25 NOTE — Patient Instructions (Signed)
Please complete your blood work prior to leaving.  Call if your cough worsens, or if you do not see improvement in the next few weeks with singulair.  Follow up in 3 months.

## 2011-08-25 NOTE — Assessment & Plan Note (Signed)
Likely cause of pt's persistent cough when laying flat.  Trial of singulair.

## 2011-08-25 NOTE — Assessment & Plan Note (Signed)
Obtain A1C. Up to date of flu shot and microalbumin. Continue ACE and for renal protection.

## 2011-08-25 NOTE — Assessment & Plan Note (Signed)
BP stable on lisinopril and Metoprolol. Continue same.

## 2011-08-26 LAB — HEMOGLOBIN A1C
Hgb A1c MFr Bld: 8.1 % — ABNORMAL HIGH (ref <5.7)
Mean Plasma Glucose: 186 mg/dL — ABNORMAL HIGH (ref <117)

## 2011-08-27 ENCOUNTER — Telehealth: Payer: Self-pay | Admitting: Family

## 2011-08-27 NOTE — Telephone Encounter (Signed)
Left message for pt to return our call.  When he calls back please let him know that A1C is still elevated. 8.1.  He should increase his levemir (long acting insulin) by 2 units every three days until his fasting AM sugar is <120, then continue at that dose.  So for example his levemir dose tonight will increase from 18 bid to 19 units bid x 3 days, then 20 units bid x 3 days etc.  Call if sugars <80.

## 2011-08-30 NOTE — Telephone Encounter (Signed)
Left message on cell voicemail for pt to return my call. 

## 2011-08-30 NOTE — Telephone Encounter (Signed)
Notified pt and he voices understanding. 

## 2011-09-16 ENCOUNTER — Telehealth: Payer: Self-pay | Admitting: *Deleted

## 2011-09-16 MED ORDER — INSULIN DETEMIR 100 UNIT/ML ~~LOC~~ SOLN: 18.0000 [IU] | Freq: Two times a day (BID) | SUBCUTANEOUS | Status: DC

## 2011-09-16 NOTE — Telephone Encounter (Signed)
Rx has been changed and sent to pharmacy.  

## 2011-09-16 NOTE — Telephone Encounter (Signed)
Received call from pt stating he picked up his Levemir Rx and directions are for 18 units at bedtime. Pt states he takes 18 units twice a day. Pt requests corrected directions be sent to pharmacy as current Rx and quantity is incorrect. Please advise.

## 2011-09-17 NOTE — Telephone Encounter (Signed)
Left detailed message on pt's cell that rx was corrected and to call if any questions.

## 2011-09-21 ENCOUNTER — Telehealth: Payer: Self-pay | Admitting: *Deleted

## 2011-09-21 NOTE — Telephone Encounter (Signed)
Pt states his BS is not as well controlled on the Levemir.  Fasting blood sugars are in the 300s.  This a.m. FBS was 320,  BS at 1pm was 190.  2:30pm today is 270.  Pt states he is taking 18 units twice a day but seems like it is not keeping his blood sugar down. Reports med compliance and no dietary changes.  Please advise.

## 2011-09-21 NOTE — Telephone Encounter (Signed)
Spoke with pt.  Discussed returning to lantus which is a higher copay for him vs. titrating up levemir.  He wants to titrate up levemir. Recommended that he increase dose to 20 units bid and call us in 1 week with his blood sugar readings.

## 2011-09-29 ENCOUNTER — Telehealth: Payer: Self-pay | Admitting: Family

## 2011-09-29 MED ORDER — METOPROLOL TARTRATE 50 MG PO TABS: 25.0000 mg | ORAL_TABLET | Freq: Two times a day (BID) | ORAL | Status: DC

## 2011-09-29 MED ORDER — LISINOPRIL 10 MG PO TABS: 10.0000 mg | ORAL_TABLET | Freq: Every day | ORAL | Status: DC

## 2011-09-29 NOTE — Telephone Encounter (Signed)
Lisinopril 71m qty 90, metoprolol 50 mg qty 90

## 2011-09-29 NOTE — Telephone Encounter (Signed)
Rx refill sent to pharmacy. 

## 2011-10-05 ENCOUNTER — Other Ambulatory Visit: Payer: Self-pay | Admitting: *Deleted

## 2011-10-05 NOTE — Telephone Encounter (Signed)
Received message from pt's wife requesting Novolog refill for pt. Spoke with pt, he states he still had refills remaining at the pharmacy as he has only been picking up 1 vial at a time. No refills needed at present.

## 2011-10-26 ENCOUNTER — Other Ambulatory Visit: Payer: Self-pay | Admitting: *Deleted

## 2011-10-26 MED ORDER — INSULIN ASPART 100 UNIT/ML ~~LOC~~ SOLN: 8.0000 [IU] | Freq: Three times a day (TID) | SUBCUTANEOUS | Status: DC

## 2011-10-26 NOTE — Telephone Encounter (Signed)
Received message from pt that he needed a refill on Novolog as he was running a little low. Spoke to Martell at [facility] and called #2 vials Novolog x 1 refill; 8 units three times daily before meals on a sliding scale. Notified pt.

## 2011-11-22 ENCOUNTER — Encounter: Payer: Self-pay | Admitting: Family

## 2011-11-22 ENCOUNTER — Telehealth: Payer: Self-pay | Admitting: Family

## 2011-11-22 ENCOUNTER — Ambulatory Visit (INDEPENDENT_AMBULATORY_CARE_PROVIDER_SITE_OTHER): Payer: Medicare Other | Admitting: Family

## 2011-11-22 VITALS — BP 142/78 | HR 60 | Temp 97.8°F | Resp 16 | Ht 69.0 in | Wt 177.0 lb

## 2011-11-22 DIAGNOSIS — E119 Type 2 diabetes mellitus without complications: Secondary | ICD-10-CM

## 2011-11-22 DIAGNOSIS — I10 Essential (primary) hypertension: Secondary | ICD-10-CM

## 2011-11-22 DIAGNOSIS — E785 Hyperlipidemia, unspecified: Secondary | ICD-10-CM

## 2011-11-22 DIAGNOSIS — J309 Allergic rhinitis, unspecified: Secondary | ICD-10-CM

## 2011-11-22 MED ORDER — INSULIN DETEMIR 100 UNIT/ML ~~LOC~~ SOLN: SUBCUTANEOUS | Status: DC

## 2011-11-22 MED ORDER — INSULIN ASPART 100 UNIT/ML ~~LOC~~ SOLN: 8.0000 [IU] | Freq: Three times a day (TID) | SUBCUTANEOUS | Status: DC

## 2011-11-22 MED ORDER — METOPROLOL TARTRATE 50 MG PO TABS: 50.0000 mg | ORAL_TABLET | Freq: Two times a day (BID) | ORAL | Status: DC

## 2011-11-22 MED ORDER — AMLODIPINE BESYLATE 2.5 MG PO TABS: 2.5000 mg | ORAL_TABLET | Freq: Every day | ORAL | Status: DC

## 2011-11-22 MED ORDER — PRAVASTATIN SODIUM 80 MG PO TABS: 80.0000 mg | ORAL_TABLET | Freq: Every day | ORAL | Status: DC

## 2011-11-22 NOTE — Assessment & Plan Note (Signed)
Fasting sugars above goal.  Increase HS Levemir from 20 to 25 units.  Continue 20 units in the AM along with novolog sliding scale AC meals.  Obtain A1C. Continue ace.

## 2011-11-22 NOTE — Assessment & Plan Note (Signed)
Improved on zyrtec. Monitor.

## 2011-11-22 NOTE — Telephone Encounter (Signed)
Left message on machine to return my call. 

## 2011-11-22 NOTE — Patient Instructions (Signed)
Please follow up in 1 month.  

## 2011-11-22 NOTE — Telephone Encounter (Signed)
Pt notified and voices understanding. 

## 2011-11-22 NOTE — Assessment & Plan Note (Signed)
BP remains above goal.  HR will not tolerate increase in beta blocker.  Therefore, will add low dose amlodipine to his regimen and have him follow up in 1 month.

## 2011-11-22 NOTE — Progress Notes (Signed)
Subjective:    Patient ID: Ryan [name], male    DOB: [birth year], 66 y.o.   MRN: [id number]  HPI  Ryan [name] is a 66 yr old male who presents today for follow up.  DM2-  Reports that his fasting sugars remain high-165-300.  He continues 20 units bid of levemir.  He continues novolog sliding scale 8-10 units tid AC meals.  Does not eat after 8 PM at night. He is active outside.  He reports that later in the day he runs 80-120.  Reports 2 episodes of lows 65 in the afternoon while working in the yard.    AR- reports that he is on zyrtec, improved.  Did not start singulair.Rare cough.    HTN- BP Readings from Last 3 Encounters:  11/22/11 142/78  08/25/11 130/80  07/13/11 120/70   Reports compliance with medications.   Review of Systems See HPI  Past Medical History  Diagnosis Date  . Diabetes mellitus   . History of chicken pox   . GERD (gastroesophageal reflux disease)   . Allergy   . Hyperlipidemia   . Hypertension     History   Social History  . Marital Status: Married    Spouse Name: N/A    Number of Children: 1  . Years of Education: N/A   Occupational History  . Not on file.   Social History Main Topics  . Smoking status: Former Smoker    Types: Cigarettes    Quit date: 09/01/1965  . Smokeless tobacco: Never Used  . Alcohol Use: Not on file  . Drug Use: No  . Sexually Active: Not on file   Other Topics Concern  . Not on file   Social History Narrative   Regular exercise:  5 x weeklyCaffeine Use: 1 cups coffee daily.Retired from Building control surveyor.Married- One son and grandson- both live with themWife has ovarian cancer    Past Surgical History  Procedure Date  . Shoulder surgery 1990    right shoulder, torn rotator cuff.  . Eye surgery 1998    bilateral    Family History  Problem Relation Age of Onset  . Cancer Mother     cancer  . Heart disease Mother   . Hyperlipidemia Mother   . Hypertension Mother   . Hyperlipidemia Father   . Stroke  Father   . Hypertension Father   . Diabetes Paternal Grandfather     No Known Allergies  Current Outpatient Prescriptions on File Prior to Visit  Medication Sig Dispense Refill  . albuterol (PROVENTIL HFA;VENTOLIN HFA) 108 (90 BASE) MCG/ACT inhaler Inhale 2 puffs into the lungs every 6 (six) hours as needed for wheezing.  3.7 g  0  . aspirin 81 MG tablet Take 81 mg by mouth daily.        . Calcium Carbonate-Vitamin D (CALTRATE 600+D) 600-400 MG-UNIT per tablet Take 1 tablet by mouth 2 (two) times daily.        . fluticasone (FLONASE) 50 MCG/ACT nasal spray Place 2 sprays into the nose daily.  16 g  2  . Fluticasone Propionate, Inhal, (FLOVENT DISKUS) 250 MCG/BLIST AEPB Inhale 1 puff into the lungs 2 (two) times daily.  28 each  0  . lisinopril (PRINIVIL,ZESTRIL) 10 MG tablet Take 1 tablet (10 mg total) by mouth daily.  90 tablet  0  . MOVIPREP 100 G SOLR movi prep as directed   1 each  0  . simvastatin (ZOCOR) 80 MG tablet Take 80 mg  by mouth daily.        Marland Kitchen DISCONTD: insulin aspart (NOVOLOG) 100 UNIT/ML injection Inject 8 Units into the skin 3 (three) times daily before meals. On a sliding scale.  2 vial  1  . DISCONTD: insulin detemir (LEVEMIR) 100 UNIT/ML injection Inject 20 Units into the skin 2 (two) times daily.      Marland Kitchen DISCONTD: metoprolol (LOPRESSOR) 50 MG tablet Take 0.5 tablets (25 mg total) by mouth 2 (two) times daily. .  90 tablet  0  . amLODipine (NORVASC) 2.5 MG tablet Take 1 tablet (2.5 mg total) by mouth daily.  30 tablet  0    BP 142/78  Pulse 60  Temp(Src) 97.8 F (36.6 C) (Oral)  Resp 16  Ht 5\' 9"  (1.753 m)  Wt 177 lb 0.6 oz (80.305 kg)  BMI 26.14 kg/m2  SpO2 98%       Objective:   Physical Exam  Constitutional: He appears well-developed and well-nourished. No distress.  Cardiovascular: Normal rate and regular rhythm.   No murmur heard. Pulmonary/Chest: Effort normal and breath sounds normal. No respiratory distress. He has no wheezes. He has no rales. He  exhibits no tenderness.  Musculoskeletal: He exhibits no edema.  Psychiatric: He has a normal mood and affect. His behavior is normal. Judgment and thought content normal.          Assessment & Plan:

## 2011-11-22 NOTE — Telephone Encounter (Signed)
Pls call pt and let him know that in place of simvastatin, I would like for him to start pravastatin 80- a full tab once daily.  This is a safer medication with the amlodipine that I have added for his BP. He should come fasting to his 1 month follow up visit please.

## 2011-11-22 NOTE — Assessment & Plan Note (Signed)
Change simvastatin to pravastatin due to addition of amlodipine to his regimen.

## 2011-11-23 LAB — HEMOGLOBIN A1C
Hgb A1c MFr Bld: 8.1 % — ABNORMAL HIGH (ref <5.7)
Mean Plasma Glucose: 186 mg/dL — ABNORMAL HIGH (ref <117)

## 2011-12-15 ENCOUNTER — Telehealth: Payer: Self-pay | Admitting: Family

## 2011-12-15 MED ORDER — INSULIN DETEMIR 100 UNIT/ML ~~LOC~~ SOLN: SUBCUTANEOUS | Status: DC

## 2011-12-15 NOTE — Telephone Encounter (Signed)
Patient states that his pharmacy([facility]) has the old directions for Levemir. Patient needs refill and would like [place] to send refill with new directions to pharmacy.

## 2011-12-15 NOTE — Telephone Encounter (Signed)
Verified with pt that he is taking 20 units in the morning and 25 units in the evening. Refill sent to pharmacy.

## 2011-12-20 ENCOUNTER — Ambulatory Visit: Payer: Medicare Other | Admitting: Family

## 2011-12-22 ENCOUNTER — Other Ambulatory Visit: Payer: Self-pay | Admitting: Family

## 2011-12-22 ENCOUNTER — Ambulatory Visit (INDEPENDENT_AMBULATORY_CARE_PROVIDER_SITE_OTHER): Payer: Medicare Other | Admitting: Family

## 2011-12-22 ENCOUNTER — Encounter: Payer: Self-pay | Admitting: Family

## 2011-12-22 VITALS — BP 130/90 | HR 50 | Temp 97.5°F | Resp 16 | Ht 69.0 in | Wt 180.1 lb

## 2011-12-22 DIAGNOSIS — E785 Hyperlipidemia, unspecified: Secondary | ICD-10-CM

## 2011-12-22 DIAGNOSIS — E119 Type 2 diabetes mellitus without complications: Secondary | ICD-10-CM

## 2011-12-22 DIAGNOSIS — I10 Essential (primary) hypertension: Secondary | ICD-10-CM

## 2011-12-22 LAB — LIPID PANEL
Cholesterol: 172 mg/dL (ref 0–200)
HDL: 47 mg/dL (ref >39)
LDL Cholesterol: 109 mg/dL — ABNORMAL HIGH (ref 0–99)
Total CHOL/HDL Ratio: 3.7 Ratio
Triglycerides: 79 mg/dL (ref <150)
VLDL: 16 mg/dL (ref 0–40)

## 2011-12-22 LAB — HEPATIC FUNCTION PANEL
ALT: 17 U/L (ref 0–53)
AST: 19 U/L (ref 0–37)
Albumin: 4.6 g/dL (ref 3.5–5.2)
Alkaline Phosphatase: 48 U/L (ref 39–117)
Bilirubin, Direct: 0.2 mg/dL (ref 0.0–0.3)
Indirect Bilirubin: 0.8 mg/dL (ref 0.0–0.9)
Total Bilirubin: 1 mg/dL (ref 0.3–1.2)
Total Protein: 7.2 g/dL (ref 6.0–8.3)

## 2011-12-22 MED ORDER — AMLODIPINE BESYLATE 2.5 MG PO TABS: 2.5000 mg | ORAL_TABLET | Freq: Every day | ORAL | Status: DC

## 2011-12-22 NOTE — Progress Notes (Signed)
Subjective:    Patient ID: Ryan [name], male    DOB: Jan 03, 1946, 66 y.o.   MRN: [id number]  HPI  [name] is a 66 yr old male who presents today for follow up.  1) HTN- last visit amlodipine was added to his regimen.  BP Readings from Last 3 Encounters:  12/22/11 130/90  11/22/11 142/78  08/25/11 130/80   Denies LE swelling, chest pain, SOB.   2) DM2-  Last visit his levemir was increased from 20 units AM and 25 units HS.  He continues novolog AC meals- he is counting carbs. Fasting sugars have been improving.    3) Hyperlipidemia-  Last visit his statin was changed to pravastatin.  Denies myalgia.    Review of Systems See HPI  Past Medical History  Diagnosis Date  . Diabetes mellitus   . History of chicken pox   . GERD (gastroesophageal reflux disease)   . Allergy   . Hyperlipidemia   . Hypertension     History   Social History  . Marital Status: Married    Spouse Name: N/A    Number of Children: 1  . Years of Education: N/A   Occupational History  . Not on file.   Social History Main Topics  . Smoking status: Former Smoker    Types: Cigarettes    Quit date: 09/01/1965  . Smokeless tobacco: Never Used  . Alcohol Use: Not on file  . Drug Use: No  . Sexually Active: Not on file   Other Topics Concern  . Not on file   Social History Narrative   Regular exercise:  5 x weeklyCaffeine Use: 1 cups coffee daily.Retired from Building control surveyor.Married- One son and grandson- both live with themWife has ovarian cancer    Past Surgical History  Procedure Date  . Shoulder surgery 1990    right shoulder, torn rotator cuff.  . Eye surgery 1998    bilateral    Family History  Problem Relation Age of Onset  . Cancer Mother     cancer  . Heart disease Mother   . Hyperlipidemia Mother   . Hypertension Mother   . Hyperlipidemia Father   . Stroke Father   . Hypertension Father   . Diabetes Paternal Grandfather     No Known Allergies  Current  Outpatient Prescriptions on File Prior to Visit  Medication Sig Dispense Refill  . albuterol (PROVENTIL HFA;VENTOLIN HFA) 108 (90 BASE) MCG/ACT inhaler Inhale 2 puffs into the lungs every 6 (six) hours as needed for wheezing.  3.7 g  0  . amLODipine (NORVASC) 2.5 MG tablet Take 1 tablet (2.5 mg total) by mouth daily.  30 tablet  0  . aspirin 81 MG tablet Take 81 mg by mouth daily.        . Calcium Carbonate-Vitamin D (CALTRATE 600+D) 600-400 MG-UNIT per tablet Take 1 tablet by mouth 2 (two) times daily.        . fluticasone (FLONASE) 50 MCG/ACT nasal spray Place 2 sprays into the nose daily.  16 g  2  . Fluticasone Propionate, Inhal, (FLOVENT DISKUS) 250 MCG/BLIST AEPB Inhale 1 puff into the lungs 2 (two) times daily.  28 each  0  . insulin aspart (NOVOLOG) 100 UNIT/ML injection Inject 8 Units into the skin 3 (three) times daily before meals. On a sliding scale.  2 vial  1  . insulin detemir (LEVEMIR) 100 UNIT/ML injection 20 units SQ in the AM 25 units SQ in the PM  20 mL  2  . lisinopril (PRINIVIL,ZESTRIL) 10 MG tablet Take 1 tablet (10 mg total) by mouth daily.  90 tablet  0  . metoprolol (LOPRESSOR) 50 MG tablet Take 25 mg by mouth 2 (two) times daily.      Marland Kitchen MOVIPREP 100 G SOLR movi prep as directed   1 each  0  . pravastatin (PRAVACHOL) 80 MG tablet Take 1 tablet (80 mg total) by mouth daily.  30 tablet  2    BP 130/90  Pulse 50  Temp(Src) 97.5 F (36.4 C) (Oral)  Resp 16  Ht 5\' 9"  (1.753 m)  Wt 180 lb 1.4 oz (81.688 kg)  BMI 26.59 kg/m2  SpO2 99%       Objective:   Physical Exam  Constitutional: He appears well-developed and well-nourished. No distress.  HENT:  Head: Normocephalic and atraumatic.  Right Ear: Tympanic membrane and ear canal normal.  Left Ear: Tympanic membrane and ear canal normal.  Mouth/Throat: No oropharyngeal exudate, posterior oropharyngeal edema or posterior oropharyngeal erythema.  Cardiovascular: Normal rate and regular rhythm.   No murmur  heard. Pulmonary/Chest: Effort normal and breath sounds normal. No respiratory distress. He has no wheezes. He has no rales. He exhibits no tenderness.  Skin: Skin is warm and dry.  Psychiatric: He has a normal mood and affect. His behavior is normal. Judgment and thought content normal.          Assessment & Plan:

## 2011-12-22 NOTE — Assessment & Plan Note (Signed)
BP control is fair,  Continue current meds for now.

## 2011-12-22 NOTE — Assessment & Plan Note (Signed)
Blood sugars are improving on current regimen.  Continue same, plan to repeat A1C next visit.

## 2011-12-22 NOTE — Patient Instructions (Signed)
Please complete lab work prior to leaving.   Please schedule a follow up appointment in 3 months.  

## 2011-12-22 NOTE — Assessment & Plan Note (Signed)
Tolerating pravastatin.  Check FLP and LFT.

## 2011-12-23 ENCOUNTER — Encounter: Payer: Self-pay | Admitting: Family

## 2011-12-23 LAB — BASIC METABOLIC PANEL WITH GFR
BUN: 16 mg/dL (ref 6–23)
CO2: 26 mEq/L (ref 19–32)
Calcium: 9.6 mg/dL (ref 8.4–10.5)
Chloride: 102 mEq/L (ref 96–112)
Creat: 0.97 mg/dL (ref 0.50–1.35)
GFR, Est African American: >89 mL/min
GFR, Est Non African American: 82 mL/min
Glucose, Bld: 195 mg/dL — ABNORMAL HIGH (ref 70–99)
Potassium: 4.8 mEq/L (ref 3.5–5.3)
Sodium: 139 mEq/L (ref 135–145)

## 2012-01-05 ENCOUNTER — Other Ambulatory Visit: Payer: Self-pay | Admitting: Family

## 2012-02-21 ENCOUNTER — Telehealth: Payer: Self-pay | Admitting: Family

## 2012-02-21 MED ORDER — "INSULIN SYRINGE-NEEDLE U-100 30G X 3/8"" 1 ML MISC": Status: DC

## 2012-02-21 NOTE — Telephone Encounter (Signed)
Rx sent to pharmacy   

## 2012-02-21 NOTE — Telephone Encounter (Signed)
Message from pharmacy:  Patient needs a new Rx for insulin syringes(30g 1/2cc). We have never filled these for him.

## 2012-02-22 ENCOUNTER — Other Ambulatory Visit: Payer: Self-pay | Admitting: Family

## 2012-02-23 ENCOUNTER — Other Ambulatory Visit: Payer: Self-pay | Admitting: Family

## 2012-03-20 ENCOUNTER — Other Ambulatory Visit: Payer: Self-pay | Admitting: Family

## 2012-03-22 ENCOUNTER — Encounter: Payer: Self-pay | Admitting: Family

## 2012-03-22 ENCOUNTER — Ambulatory Visit (INDEPENDENT_AMBULATORY_CARE_PROVIDER_SITE_OTHER): Payer: Medicare Other | Admitting: Family

## 2012-03-22 VITALS — BP 120/88 | HR 50 | Temp 97.6°F | Resp 16 | Ht 69.0 in | Wt 178.0 lb

## 2012-03-22 DIAGNOSIS — E119 Type 2 diabetes mellitus without complications: Secondary | ICD-10-CM

## 2012-03-22 DIAGNOSIS — L98499 Non-pressure chronic ulcer of skin of other sites with unspecified severity: Secondary | ICD-10-CM

## 2012-03-22 DIAGNOSIS — I10 Essential (primary) hypertension: Secondary | ICD-10-CM

## 2012-03-22 LAB — HEMOGLOBIN A1C
Hgb A1c MFr Bld: 7.5 % — ABNORMAL HIGH (ref <5.7)
Mean Plasma Glucose: 169 mg/dL — ABNORMAL HIGH (ref <117)

## 2012-03-22 MED ORDER — INSULIN ASPART 100 UNIT/ML ~~LOC~~ SOLN: SUBCUTANEOUS | Status: DC

## 2012-03-22 MED ORDER — CEPHALEXIN 500 MG PO CAPS: 500.0000 mg | ORAL_CAPSULE | Freq: Four times a day (QID) | ORAL | Status: AC

## 2012-03-22 NOTE — Progress Notes (Signed)
Subjective:    Patient ID: Ryan [name], male    DOB: 06/13/1946, 66 y.o.   MRN: [id number]  HPI  [name] is a 66 yr old male who presents today for follow up.  DM2-He brings with him today his blood sugar log.  He has had some fasting sugars that hover around 20, but his post prandial sugars have generally stayed <140.    Buttock ulcer right-  Reports that he has had a sore on his right buttock for 2-3 weeks which will not get better.  Thinks that it started with a bug bit.  HTN- denies cp/sob/swelling.  Review of Systems See HPI  Past Medical History  Diagnosis Date  . Diabetes mellitus   . History of chicken pox   . GERD (gastroesophageal reflux disease)   . Allergy   . Hyperlipidemia   . Hypertension     History   Social History  . Marital Status: Married    Spouse Name: N/A    Number of Children: 1  . Years of Education: N/A   Occupational History  . Not on file.   Social History Main Topics  . Smoking status: Former Smoker    Types: Cigarettes    Quit date: 09/01/1965  . Smokeless tobacco: Never Used  . Alcohol Use: Not on file  . Drug Use: No  . Sexually Active: Not on file   Other Topics Concern  . Not on file   Social History Narrative   Regular exercise:  5 x weeklyCaffeine Use: 1 cups coffee daily.Retired from Librarian, academic.Married- One son and grandson- both live with themWife has ovarian cancer    Past Surgical History  Procedure Date  . Shoulder surgery 1990    right shoulder, torn rotator cuff.  . Eye surgery 1998    bilateral    Family History  Problem Relation Age of Onset  . Cancer Mother     cancer  . Heart disease Mother   . Hyperlipidemia Mother   . Hypertension Mother   . Hyperlipidemia Father   . Stroke Father   . Hypertension Father   . Diabetes Paternal Grandfather     No Known Allergies  Current Outpatient Prescriptions on File Prior to Visit  Medication Sig Dispense Refill  . albuterol (PROVENTIL  HFA;VENTOLIN HFA) 108 (90 BASE) MCG/ACT inhaler Inhale 2 puffs into the lungs every 6 (six) hours as needed for wheezing.  3.7 g  0  . amLODipine (NORVASC) 2.5 MG tablet Take 1 tablet (2.5 mg total) by mouth daily.  30 tablet  5  . aspirin 81 MG tablet Take 81 mg by mouth daily.        . Calcium Carbonate-Vitamin D (CALTRATE 600+D) 600-400 MG-UNIT per tablet Take 1 tablet by mouth 2 (two) times daily.        . fluticasone (FLONASE) 50 MCG/ACT nasal spray Place 2 sprays into the nose daily.  16 g  2  . Fluticasone Propionate, Inhal, (FLOVENT DISKUS) 250 MCG/BLIST AEPB Inhale 1 puff into the lungs 2 (two) times daily.  28 each  0  . Insulin Syringe-Needle U-100 30G X 3/8" 1 ML MISC Use with insulin injections daily.  100 each  2  . LEVEMIR 100 UNIT/ML injection INJECT 20 UNITS INTO THE SKIN IN THE MORNING AND 25 UNITS INTO THE SKIN IN THE EVENING  20 mL  2  . lisinopril (PRINIVIL,ZESTRIL) 10 MG tablet TAKE 1 TABLET (10 MG TOTAL) BY MOUTH DAILY.  90 tablet  1  . metoprolol (LOPRESSOR) 50 MG tablet Take 25 mg by mouth 2 (two) times daily.      . metoprolol (LOPRESSOR) 50 MG tablet TAKE 1/2 TABLET BY MOUTH 2 TIMES DAILY.  30 tablet  2  . MOVIPREP 100 G SOLR movi prep as directed   1 each  0  . pravastatin (PRAVACHOL) 80 MG tablet TAKE 1 TABLET (80 MG TOTAL) BY MOUTH DAILY.  30 tablet  2  . DISCONTD: NOVOLOG 100 UNIT/ML injection INJECT 8 UNITS 3 TIMES A DAY BEFORE MEALS ON A SLIDING SCALE  20 mL  2    BP 120/88  Pulse 50  Temp 97.6 F (36.4 C) (Oral)  Resp 16  Ht 5' 9"$  (1.753 m)  Wt 178 lb (80.74 kg)  BMI 26.29 kg/m2  SpO2 98%       Objective:   Physical Exam  Constitutional: He is oriented to person, place, and time. He appears well-developed and well-nourished. No distress.  Cardiovascular: Normal rate and regular rhythm.   No murmur heard. Pulmonary/Chest: Effort normal and breath sounds normal. No respiratory distress. He has no wheezes. He has no rales. He exhibits no tenderness.    Neurological: He is alert and oriented to person, place, and time.  Skin: Skin is warm and dry.       Shallow small ulcerated lesion on the right buttock.  Minimal surrounding erythema.   Psychiatric: He has a normal mood and affect. His behavior is normal. Judgment and thought content normal.          Assessment & Plan:

## 2012-03-22 NOTE — Patient Instructions (Addendum)
Complete lab work prior to leaving. Please schedule a follow up appointment in 3 months.  

## 2012-03-22 NOTE — Assessment & Plan Note (Signed)
BP stable. Continue current meds.   

## 2012-03-22 NOTE — Assessment & Plan Note (Signed)
Small, will treat with keflex.

## 2012-03-22 NOTE — Assessment & Plan Note (Signed)
Overall control is good.  He has had a few lows in the 60's.  Will check A1C. A sample bottle of novolog was provided today.

## 2012-03-23 ENCOUNTER — Encounter: Payer: Self-pay | Admitting: Family

## 2012-03-28 ENCOUNTER — Telehealth: Payer: Self-pay | Admitting: Family

## 2012-03-28 NOTE — Telephone Encounter (Signed)
Patient is requesting last lab results

## 2012-03-28 NOTE — Telephone Encounter (Signed)
See lab letter 8/15.

## 2012-03-29 NOTE — Telephone Encounter (Signed)
Notified pt with info on lab letter. Did inform pt should be receiving lab letter soon... 03/29/12@9 :19am/LMB

## 2012-04-20 ENCOUNTER — Ambulatory Visit (INDEPENDENT_AMBULATORY_CARE_PROVIDER_SITE_OTHER): Payer: Medicare Other | Admitting: *Deleted

## 2012-04-20 DIAGNOSIS — Z23 Encounter for immunization: Secondary | ICD-10-CM

## 2012-05-02 ENCOUNTER — Encounter: Payer: Self-pay | Admitting: Internal Medicine

## 2012-05-29 ENCOUNTER — Other Ambulatory Visit: Payer: Self-pay | Admitting: Family

## 2012-06-14 ENCOUNTER — Telehealth: Payer: Self-pay | Admitting: *Deleted

## 2012-06-14 MED ORDER — INSULIN ASPART 100 UNIT/ML ~~LOC~~ SOLN: SUBCUTANEOUS | Status: DC

## 2012-06-14 NOTE — Telephone Encounter (Signed)
Pt called requesting sample of Novolog u100 vial as he is "running low". Has f/u with [place] on 06/21/12. Wants to discuss Novolog alternative as [employer] will no longer cover novolog as of 08/09/12. Pt will pick up sample tomorrow.

## 2012-06-21 ENCOUNTER — Other Ambulatory Visit: Payer: Self-pay | Admitting: Family

## 2012-06-21 ENCOUNTER — Ambulatory Visit (INDEPENDENT_AMBULATORY_CARE_PROVIDER_SITE_OTHER): Payer: Medicare Other | Admitting: Family

## 2012-06-21 ENCOUNTER — Encounter: Payer: Self-pay | Admitting: Family

## 2012-06-21 VITALS — BP 134/86 | HR 53 | Temp 97.9°F | Resp 16 | Ht 69.0 in | Wt 182.0 lb

## 2012-06-21 DIAGNOSIS — E119 Type 2 diabetes mellitus without complications: Secondary | ICD-10-CM

## 2012-06-21 DIAGNOSIS — E785 Hyperlipidemia, unspecified: Secondary | ICD-10-CM

## 2012-06-21 DIAGNOSIS — L98499 Non-pressure chronic ulcer of skin of other sites with unspecified severity: Secondary | ICD-10-CM

## 2012-06-21 DIAGNOSIS — I10 Essential (primary) hypertension: Secondary | ICD-10-CM

## 2012-06-21 LAB — BASIC METABOLIC PANEL WITH GFR
BUN: 14 mg/dL (ref 6–23)
CO2: 25 mEq/L (ref 19–32)
Calcium: 9.8 mg/dL (ref 8.4–10.5)
Chloride: 101 mEq/L (ref 96–112)
Creat: 0.93 mg/dL (ref 0.50–1.35)
GFR, Est African American: >89 mL/min
GFR, Est Non African American: 85 mL/min
Glucose, Bld: 220 mg/dL — ABNORMAL HIGH (ref 70–99)
Potassium: 4.5 mEq/L (ref 3.5–5.3)
Sodium: 137 mEq/L (ref 135–145)

## 2012-06-21 LAB — HEMOGLOBIN A1C
Hgb A1c MFr Bld: 7.6 % — ABNORMAL HIGH (ref <5.7)
Mean Plasma Glucose: 171 mg/dL — ABNORMAL HIGH (ref <117)

## 2012-06-21 LAB — HEPATIC FUNCTION PANEL
ALT: 20 U/L (ref 0–53)
AST: 18 U/L (ref 0–37)
Albumin: 4.5 g/dL (ref 3.5–5.2)
Alkaline Phosphatase: 52 U/L (ref 39–117)
Bilirubin, Direct: 0.3 mg/dL (ref 0.0–0.3)
Indirect Bilirubin: 1.2 mg/dL — ABNORMAL HIGH (ref 0.0–0.9)
Total Bilirubin: 1.5 mg/dL — ABNORMAL HIGH (ref 0.3–1.2)
Total Protein: 6.9 g/dL (ref 6.0–8.3)

## 2012-06-21 LAB — LIPID PANEL
Cholesterol: 165 mg/dL (ref 0–200)
HDL: 38 mg/dL — ABNORMAL LOW (ref >39)
LDL Cholesterol: 107 mg/dL — ABNORMAL HIGH (ref 0–99)
Total CHOL/HDL Ratio: 4.3 Ratio
Triglycerides: 98 mg/dL (ref <150)
VLDL: 20 mg/dL (ref 0–40)

## 2012-06-21 MED ORDER — INSULIN LISPRO 100 UNIT/ML ~~LOC~~ SOLN: SUBCUTANEOUS | Status: DC

## 2012-06-21 MED ORDER — AMLODIPINE BESYLATE 2.5 MG PO TABS: 2.5000 mg | ORAL_TABLET | Freq: Every day | ORAL | Status: DC

## 2012-06-21 NOTE — Assessment & Plan Note (Addendum)
Pt is fasting. Check FLP. Tolerating statin.

## 2012-06-21 NOTE — Patient Instructions (Addendum)
Please complete your blood work prior to leaving. Please schedule a follow up appointment in 3 months.  

## 2012-06-21 NOTE — Assessment & Plan Note (Signed)
BP is stable.  Continue ACE, lopressor.

## 2012-06-21 NOTE — Progress Notes (Signed)
Subjective:    Patient ID: Ryan [name], male    DOB: October 18, 1945, 66 y.o.   MRN: [id number]  HPI  Mr. Lips is a 66 yr old male who presents today for follow up.  1) HTN- He denies CP/SOB or swelling.    2) DM2- he brings a very detailed blood sugar log with him  Today.  Sugars are generally at goal with the exception of some fasting blood sugars hovering 150-200- though this is not consistent.  He has had a few post prandial lows- 60 two hours after lunch.  Insurance will only be covering humalog after 1/14.  He is using levemir 20 units in AM and 20 units pm.  Uses novolog SS coverage <12 units AC meals tid.   3) Hyperlipidemia-  He is fasting today.    4) skin ulcer- reports that the area on his buttock is "still there."  Abx did not help.    Review of Systems    see HPI  Past Medical History  Diagnosis Date  . Diabetes mellitus   . History of chicken pox   . GERD (gastroesophageal reflux disease)   . Allergy   . Hyperlipidemia   . Hypertension     History   Social History  . Marital Status: Married    Spouse Name: N/A    Number of Children: 1  . Years of Education: N/A   Occupational History  . Not on file.   Social History Main Topics  . Smoking status: Former Smoker    Types: Cigarettes    Quit date: 09/01/1965  . Smokeless tobacco: Never Used  . Alcohol Use: Not on file  . Drug Use: No  . Sexually Active: Not on file   Other Topics Concern  . Not on file   Social History Narrative   Regular exercise:  5 x weeklyCaffeine Use: 1 cups coffee daily.Retired from Building control surveyor.Married- One son and grandson- both live with themWife has ovarian cancer    Past Surgical History  Procedure Date  . Shoulder surgery 1990    right shoulder, torn rotator cuff.  . Eye surgery 1998    bilateral    Family History  Problem Relation Age of Onset  . Cancer Mother     cancer  . Heart disease Mother   . Hyperlipidemia Mother   . Hypertension Mother   .  Hyperlipidemia Father   . Stroke Father   . Hypertension Father   . Diabetes Paternal Grandfather     No Known Allergies  Current Outpatient Prescriptions on File Prior to Visit  Medication Sig Dispense Refill  . albuterol (PROVENTIL HFA;VENTOLIN HFA) 108 (90 BASE) MCG/ACT inhaler Inhale 2 puffs into the lungs every 6 (six) hours as needed for wheezing.  3.7 g  0  . amLODipine (NORVASC) 2.5 MG tablet Take 1 tablet (2.5 mg total) by mouth daily.  30 tablet  5  . aspirin 81 MG tablet Take 81 mg by mouth daily.        . Calcium Carbonate-Vitamin D (CALTRATE 600+D) 600-400 MG-UNIT per tablet Take 1 tablet by mouth 2 (two) times daily.        . Fluticasone Propionate, Inhal, (FLOVENT DISKUS) 250 MCG/BLIST AEPB Inhale 1 puff into the lungs 2 (two) times daily.  28 each  0  . insulin aspart (NOVOLOG) 100 UNIT/ML injection Inject 12 units (sliding scale) three times daily before each meal  10 mL  0  . Insulin Syringe-Needle U-100 30G X  3/8" 1 ML MISC Use with insulin injections daily.  100 each  2  . LEVEMIR 100 UNIT/ML injection INJECT 20 UNITS INTO THE SKIN IN THE MORNING AND 25 UNITS INTO THE SKIN IN THE EVENING  20 mL  2  . lisinopril (PRINIVIL,ZESTRIL) 10 MG tablet TAKE 1 TABLET (10 MG TOTAL) BY MOUTH DAILY.  90 tablet  1  . metoprolol (LOPRESSOR) 50 MG tablet Take 25 mg by mouth 2 (two) times daily.      . metoprolol (LOPRESSOR) 50 MG tablet TAKE 1/2 TABLET BY MOUTH 2 TIMES DAILY.  30 tablet  2  . pravastatin (PRAVACHOL) 80 MG tablet TAKE 1 TABLET (80 MG TOTAL) BY MOUTH DAILY.  30 tablet  2  . [DISCONTINUED] fluticasone (FLONASE) 50 MCG/ACT nasal spray Place 2 sprays into the nose daily.  16 g  2  . MOVIPREP 100 G SOLR movi prep as directed   1 each  0    BP 134/86  Pulse 53  Temp 97.9 F (36.6 C) (Oral)  Resp 16  Ht 5\' 9"  (1.753 m)  Wt 182 lb (82.555 kg)  BMI 26.88 kg/m2  SpO2 98%    Objective:   Physical Exam  Constitutional: He appears well-developed and well-nourished. No  distress.  Cardiovascular: Normal rate and regular rhythm.   No murmur heard. Pulmonary/Chest: Effort normal and breath sounds normal. No respiratory distress. He has no wheezes. He has no rales. He exhibits no tenderness.  Abdominal: Soft.  Musculoskeletal: He exhibits no edema.  Skin: Skin is warm and dry.  Psychiatric: He has a normal mood and affect. His behavior is normal. Judgment and thought content normal.          Assessment & Plan:

## 2012-06-21 NOTE — Assessment & Plan Note (Signed)
Pt has firm induration on right buttock. ? Healing cyst.  No fluctuance no surrounding erythema.  Did not improve with abx.  Offered referral to surgeon for evaluation.  He declines. He wishes to monitor for now.  I have asked him to let me know if the area becomes larger, red, painful or if drainage occurs.  He verbalizes understanding.

## 2012-06-21 NOTE — Assessment & Plan Note (Signed)
Appears generally well controlled. Will switch novolog to humalog due to formulary change with insurance.  I have asked him to decrease his lunch time sliding scale coverage by 2 units to see if we can avoid the afternoon hypoglycemia that was noted on his blood sugar log.

## 2012-06-22 LAB — MICROALBUMIN / CREATININE URINE RATIO
Creatinine, Urine: 209.1 mg/dL
Microalb Creat Ratio: 5.4 mg/g (ref 0.0–30.0)
Microalb, Ur: 1.12 mg/dL (ref 0.00–1.89)

## 2012-07-05 ENCOUNTER — Other Ambulatory Visit: Payer: Self-pay | Admitting: Family

## 2012-07-05 NOTE — Telephone Encounter (Signed)
Rx to pharmacy/SLS 

## 2012-07-18 ENCOUNTER — Other Ambulatory Visit: Payer: Self-pay | Admitting: Family

## 2012-07-18 NOTE — Telephone Encounter (Signed)
levimir refill

## 2012-07-31 ENCOUNTER — Telehealth: Payer: Self-pay | Admitting: *Deleted

## 2012-07-31 NOTE — Telephone Encounter (Signed)
Received call from pt stating humalog doesn't seem to be working as well as the novolog did. Pt states FBS now 150-160. Today FBS was 92. States novolog seemed to bring BS down quicker. Pt states he has been having to take 3-5 units more than indicated by his sliding scale. Recently received injections from the foot doctor and BS ran up between 300-400 x 4 days. Pt has notified foot doctor and states they will not use that medication on him in the future. States he will now be out before the end of this month. He is currently in the donut hole and medications will cost him more. We do have humalog sample on hand.  Please advise.

## 2012-07-31 NOTE — Telephone Encounter (Signed)
OK to provide samples of humalog.  Humalog and Novolog are similar but not exactly the same so we may need to adjust.  Keep a log and fax to [place] in 1 week or drop by the office along with sliding scale and I will review.

## 2012-08-01 MED ORDER — INSULIN LISPRO 100 UNIT/ML ~~LOC~~ SOLN: SUBCUTANEOUS | Status: DC

## 2012-08-01 NOTE — Telephone Encounter (Signed)
Notified pt and he voices understanding. States he has enough insulin to get him through Christmas. Sample left in refrigerator for pt to pick up.

## 2012-09-05 ENCOUNTER — Telehealth: Payer: Self-pay | Admitting: *Deleted

## 2012-09-05 MED ORDER — INSULIN LISPRO 100 UNIT/ML ~~LOC~~ SOLN: SUBCUTANEOUS | Status: DC

## 2012-09-05 NOTE — Telephone Encounter (Signed)
Received call from Kim at [facility] that insurance did accept a new Rx for humalog with directions of 10-15 units on a sliding scale, #2 vials x 4 refills. She reports that insurance will not let her run a claim for more than 1 vial of levemir but insurance should let pt refill Rx every 23 days for his usual copay. Notified pt and he states insurance is telling him 1 vial should last him for 30 days. Spoke with Romeo Apple in the pharmacy and he states that they ran Levemir refill through today and it is covered for usual copay and has only been 22 days since last refill. Notified pt.

## 2012-09-05 NOTE — Telephone Encounter (Signed)
Received call from pt stating he continues to run 7-12 days short on his insulins every month (especially humalog). Pt states humalog is 10-15 units three times daily on a sliding scale and levemir is 20 units in the morning and 25 units in the evening. Spoke with Selena Batten at [employer] and she states this is an Community education officer issue as they will only pay for a 30 day supply and no more. Current supply of humalog is about 22 days supply and levemir is a 23 day supply but insurance has not been covering a 2nd vial of either as it would put pt over a 30 day supply. Selena Batten is going to run some test claims and call me back with outcome.

## 2012-09-06 ENCOUNTER — Other Ambulatory Visit: Payer: Self-pay | Admitting: Family

## 2012-09-19 ENCOUNTER — Encounter: Payer: Self-pay | Admitting: Family

## 2012-09-19 ENCOUNTER — Ambulatory Visit (INDEPENDENT_AMBULATORY_CARE_PROVIDER_SITE_OTHER): Payer: Medicare Other | Admitting: Family

## 2012-09-19 ENCOUNTER — Telehealth: Payer: Self-pay | Admitting: Family

## 2012-09-19 VITALS — BP 134/80 | HR 47 | Temp 97.7°F | Resp 16 | Ht 69.0 in | Wt 179.0 lb

## 2012-09-19 DIAGNOSIS — E118 Type 2 diabetes mellitus with unspecified complications: Secondary | ICD-10-CM

## 2012-09-19 DIAGNOSIS — E119 Type 2 diabetes mellitus without complications: Secondary | ICD-10-CM

## 2012-09-19 DIAGNOSIS — I10 Essential (primary) hypertension: Secondary | ICD-10-CM

## 2012-09-19 DIAGNOSIS — IMO0002 Reserved for concepts with insufficient information to code with codable children: Secondary | ICD-10-CM

## 2012-09-19 DIAGNOSIS — E1165 Type 2 diabetes mellitus with hyperglycemia: Secondary | ICD-10-CM

## 2012-09-19 DIAGNOSIS — E785 Hyperlipidemia, unspecified: Secondary | ICD-10-CM

## 2012-09-19 MED ORDER — METOPROLOL SUCCINATE ER 25 MG PO TB24: 25.0000 mg | ORAL_TABLET | Freq: Every day | ORAL | Status: DC

## 2012-09-19 MED ORDER — AMLODIPINE BESYLATE 5 MG PO TABS: 5.0000 mg | ORAL_TABLET | Freq: Every day | ORAL | Status: DC

## 2012-09-19 NOTE — Assessment & Plan Note (Signed)
Improving, but fasting sugars above goal.  Will titrate levemir as noted in AVS.  He will reschedule his eye exam.

## 2012-09-19 NOTE — Assessment & Plan Note (Signed)
BP Readings from Last 3 Encounters:  09/19/12 134/80  06/21/12 134/86  03/22/12 120/88   BP looks good by HR is low.  Plan decrease metoprolol from 25mg  bid to toprol xl 25mg  daily, increase amlodipine from 2.5 to 5mg - see phone note.

## 2012-09-19 NOTE — Patient Instructions (Addendum)
Increase evening dose levemir by 2 units every 3 days until fasting blood sugar is <110, then continue at that dose. Please return on Friday for lab work. Follow up in 3 months.

## 2012-09-19 NOTE — Progress Notes (Signed)
Subjective:    Patient ID: Ryan [name], male    DOB: 1945/09/15, 67 y.o.   MRN: [id number]  HPI  Ryan [name] is a 67 yr old male who presents today for follow up.  1) HTN-  He is maintained on lisinopril, metoprolol, and amlodipine. Denies CP/SOB or swelling.    2) DM2-  Last visit novolog was changed to humalog.  He was having some afternoon hypoglycemia and we decreased his lunch time novolog by 2 units.  He continues levemir 20 units in the morning and 25 in the evening.   He brings with him today his blood sugar log.  Fasting sugars over the last week have ranged from 98 to 280.  In general fasting sugars are hovering around 200.  Post prandial breakfast is generally <200.  He has started exercising more.    3) Hyperlipidemia- he is maintained on pravastatin. Denies myalgia.    Review of Systems    see HPI  Past Medical History  Diagnosis Date  . Diabetes mellitus   . History of chicken pox   . GERD (gastroesophageal reflux disease)   . Allergy   . Hyperlipidemia   . Hypertension     History   Social History  . Marital Status: Married    Spouse Name: N/A    Number of Children: 1  . Years of Education: N/A   Occupational History  . Not on file.   Social History Main Topics  . Smoking status: Former Smoker    Types: Cigarettes    Quit date: 09/01/1965  . Smokeless tobacco: Never Used  . Alcohol Use: Not on file  . Drug Use: No  . Sexually Active: Not on file   Other Topics Concern  . Not on file   Social History Narrative   Regular exercise:  5 x weekly   Caffeine Use: 1 cups coffee daily.   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   Married- One son and grandson- both live with them   Wife has ovarian cancer             Past Surgical History  Procedure Laterality Date  . Shoulder surgery  1990    right shoulder, torn rotator cuff.  . Eye surgery  1998    bilateral    Family History  Problem Relation Age of Onset  . Cancer Mother     cancer  . Heart  disease Mother   . Hyperlipidemia Mother   . Hypertension Mother   . Hyperlipidemia Father   . Stroke Father   . Hypertension Father   . Diabetes Paternal Grandfather     No Known Allergies  Current Outpatient Prescriptions on File Prior to Visit  Medication Sig Dispense Refill  . amLODipine (NORVASC) 2.5 MG tablet Take 1 tablet (2.5 mg total) by mouth daily.  90 tablet  1  . aspirin 81 MG tablet Take 81 mg by mouth daily.        . Calcium Carbonate-Vitamin D (CALTRATE 600+D) 600-400 MG-UNIT per tablet Take 1 tablet by mouth 2 (two) times daily.        . fluticasone (FLONASE) 50 MCG/ACT nasal spray Place 2 sprays into the nose daily.      . Fluticasone Propionate, Inhal, (FLOVENT DISKUS) 250 MCG/BLIST AEPB Inhale 1 puff into the lungs 2 (two) times daily.  28 each  0  . insulin lispro (HUMALOG) 100 UNIT/ML injection Use 10-15 units 3 times daily with sliding scale  20 mL  4  .  Insulin Syringe-Needle U-100 30G X 3/8" 1 ML MISC Use with insulin injections daily.  100 each  2  . LEVEMIR 100 UNIT/ML injection INJECT 20 UNITS INTO THE SKIN IN THE MORNING AND 25 UNITS INTO THE SKIN IN THE EVENING  20 mL  1  . lisinopril (PRINIVIL,ZESTRIL) 10 MG tablet TAKE 1 TABLET (10 MG TOTAL) BY MOUTH DAILY.  90 tablet  1  . metoprolol (LOPRESSOR) 50 MG tablet TAKE 1/2 TABLET BY MOUTH 2 TIMES DAILY.  30 tablet  2  . pravastatin (PRAVACHOL) 80 MG tablet TAKE 1 TABLET (80 MG TOTAL) BY MOUTH DAILY.  30 tablet  2  . albuterol (PROVENTIL HFA;VENTOLIN HFA) 108 (90 BASE) MCG/ACT inhaler Inhale 2 puffs into the lungs every 6 (six) hours as needed for wheezing.  3.7 g  0   No current facility-administered medications on file prior to visit.    BP 134/80  Pulse 47  Temp(Src) 97.7 F (36.5 C) (Oral)  Resp 16  Ht 5\' 9"  (1.753 m)  Wt 179 lb (81.194 kg)  BMI 26.42 kg/m2  SpO2 97%    Objective:   Physical Exam  Constitutional: He is oriented to person, place, and time. He appears well-developed and  well-nourished. No distress.  Cardiovascular: Normal rate and regular rhythm.   No murmur heard. Pulmonary/Chest: Effort normal and breath sounds normal. No respiratory distress. He has no wheezes. He has no rales. He exhibits no tenderness.  Musculoskeletal: He exhibits no edema.  Neurological: He is alert and oriented to person, place, and time.  Psychiatric: He has a normal mood and affect. His behavior is normal. Judgment and thought content normal.          Assessment & Plan:

## 2012-09-19 NOTE — Assessment & Plan Note (Signed)
Tolerating statin, LDL nearly at goal.  Continue low cholesterol diet.

## 2012-09-19 NOTE — Telephone Encounter (Signed)
NEW DIRECTIONS ON LONG ACTING INSULIN

## 2012-09-19 NOTE — Telephone Encounter (Signed)
Please call pt and let him know that I noted his heart rate to be low.  I would like him to change his metoprolol 50mg  1/2 tab bid to Toprol xl 25mg  once daily. This should help his heart rate.  Because we are lowering bp med, I would also like him to increase amlodipine from 2.5 mg to 5mg .  He should have nurse visit on Friday for bp and heart rate check when he comes for blood work. Instructions on levemir are to increase HS dose by 2 units every 3 days until fasting sugar is <110, then stop at that dose.  Keep AM dose the same.

## 2012-09-25 LAB — HEMOGLOBIN A1C
Hgb A1c MFr Bld: 7.8 % — ABNORMAL HIGH (ref <5.7)
Mean Plasma Glucose: 177 mg/dL — ABNORMAL HIGH (ref <117)

## 2012-09-27 ENCOUNTER — Telehealth: Payer: Self-pay | Admitting: Family

## 2012-09-27 NOTE — Telephone Encounter (Signed)
Pls call pt and let him know that his A1C is above goal.  I would like him to increase his AM levemir to 25 units and continue the 25 units at bedtime.

## 2012-09-29 NOTE — Telephone Encounter (Signed)
Notified pt and he voices understanding. He will contact us when next refill of Levemir is needed so we can reflect his current dose.

## 2012-09-29 NOTE — Telephone Encounter (Signed)
Notified pt and he voices understanding. Pt will come in at the first of next week for BP/pulse check.

## 2012-10-02 ENCOUNTER — Ambulatory Visit (INDEPENDENT_AMBULATORY_CARE_PROVIDER_SITE_OTHER): Payer: Medicare Other | Admitting: Family

## 2012-10-02 VITALS — BP 124/82 | HR 57 | Resp 16

## 2012-10-02 DIAGNOSIS — I10 Essential (primary) hypertension: Secondary | ICD-10-CM

## 2012-10-02 NOTE — Assessment & Plan Note (Addendum)
BP Readings from Last 3 Encounters:  10/02/12 124/82  09/19/12 134/80  06/21/12 134/86   BP looks good.  HR <60, could benefit from decrease in beta blocker as planned.

## 2012-10-02 NOTE — Progress Notes (Signed)
  Subjective:    Patient ID: Ryan [name], male    DOB: 22-Jan-1946, 67 y.o.   MRN: [id number]  HPI    Review of Systems     Objective:   Physical Exam        Assessment & Plan:

## 2012-10-19 ENCOUNTER — Telehealth: Payer: Self-pay | Admitting: *Deleted

## 2012-10-19 MED ORDER — INSULIN DETEMIR 100 UNIT/ML ~~LOC~~ SOLN: SUBCUTANEOUS | Status: DC

## 2012-10-19 NOTE — Telephone Encounter (Signed)
Pt called stating he needs refill on Levemir. He is currently at 25 units in the morning and 27 units in the evening and titrating up. Refill sent.

## 2012-10-20 NOTE — Telephone Encounter (Signed)
Notified Pam at [facility] pharmacy to change directions to indicate titrating evening dose until fasting blood sugar is less than 110 instead of (<130) as written on original rx.  She requests that once pt gets to maintenance dose that we change directions to specific dosage instead titrating directions. Noted.

## 2012-10-20 NOTE — Addendum Note (Signed)
Addended by: Mervin Kung A on: 10/20/2012 09:57 AM   Modules accepted: Orders

## 2012-10-30 ENCOUNTER — Other Ambulatory Visit: Payer: Self-pay | Admitting: Family

## 2012-11-07 NOTE — Telephone Encounter (Signed)
Pt NOT Billed Colon Cx Fee

## 2012-11-29 ENCOUNTER — Other Ambulatory Visit: Payer: Self-pay | Admitting: Family

## 2012-12-19 ENCOUNTER — Ambulatory Visit: Payer: Medicare Other | Admitting: Family

## 2012-12-28 ENCOUNTER — Other Ambulatory Visit: Payer: Self-pay | Admitting: Family

## 2013-01-09 ENCOUNTER — Ambulatory Visit (INDEPENDENT_AMBULATORY_CARE_PROVIDER_SITE_OTHER): Payer: Medicare Other | Admitting: Family

## 2013-01-09 ENCOUNTER — Encounter: Payer: Self-pay | Admitting: Family

## 2013-01-09 VITALS — BP 136/84 | HR 56 | Temp 98.2°F | Resp 16 | Ht 69.0 in | Wt 182.1 lb

## 2013-01-09 DIAGNOSIS — IMO0001 Reserved for inherently not codable concepts without codable children: Secondary | ICD-10-CM

## 2013-01-09 DIAGNOSIS — E119 Type 2 diabetes mellitus without complications: Secondary | ICD-10-CM

## 2013-01-09 DIAGNOSIS — E785 Hyperlipidemia, unspecified: Secondary | ICD-10-CM

## 2013-01-09 DIAGNOSIS — I10 Essential (primary) hypertension: Secondary | ICD-10-CM

## 2013-01-09 LAB — BASIC METABOLIC PANEL WITH GFR
BUN: 14 mg/dL (ref 6–23)
CO2: 26 mEq/L (ref 19–32)
Calcium: 9.6 mg/dL (ref 8.4–10.5)
Chloride: 103 mEq/L (ref 96–112)
Creat: 0.97 mg/dL (ref 0.50–1.35)
GFR, Est African American: >89 mL/min
GFR, Est Non African American: 81 mL/min
Glucose, Bld: 106 mg/dL — ABNORMAL HIGH (ref 70–99)
Potassium: 4 mEq/L (ref 3.5–5.3)
Sodium: 137 mEq/L (ref 135–145)

## 2013-01-09 LAB — HEPATIC FUNCTION PANEL
ALT: 15 U/L (ref 0–53)
AST: 18 U/L (ref 0–37)
Albumin: 4.6 g/dL (ref 3.5–5.2)
Alkaline Phosphatase: 49 U/L (ref 39–117)
Bilirubin, Direct: 0.2 mg/dL (ref 0.0–0.3)
Indirect Bilirubin: 1 mg/dL — ABNORMAL HIGH (ref 0.0–0.9)
Total Bilirubin: 1.2 mg/dL (ref 0.3–1.2)
Total Protein: 7 g/dL (ref 6.0–8.3)

## 2013-01-09 LAB — LIPID PANEL
Cholesterol: 152 mg/dL (ref 0–200)
HDL: 40 mg/dL (ref >39)
LDL Cholesterol: 95 mg/dL (ref 0–99)
Total CHOL/HDL Ratio: 3.8 Ratio
Triglycerides: 84 mg/dL (ref <150)
VLDL: 17 mg/dL (ref 0–40)

## 2013-01-09 LAB — HEMOGLOBIN A1C
Hgb A1c MFr Bld: 6.9 % — ABNORMAL HIGH (ref <5.7)
Mean Plasma Glucose: 151 mg/dL — ABNORMAL HIGH (ref <117)

## 2013-01-09 NOTE — Progress Notes (Signed)
Subjective:    Patient ID: Ryan [name], male    DOB: 12-Mar-1946, 67 y.o.   MRN: [id number]  HPI  [name] is a 67 yr old male who presents today for follow up of multiple medical problems:  1) HTN-  Last visit metoprolol was changed from 25mg  bid to toprol xl 25mg  daily due to bradycardia and amlodipine was increased from 2.5 to 5mg . Denies CP/SOB or swelling.  2) Hyperlipidemia- last LDL 11/13 was slightly above goal at 107.  He is maintained on pravastatin. Denies myalgia.    3) DM2-  He brings with him today his blood sugar log. Fasting sugars remain above goal- 87- 184 over the last month with the majority of his sugars around 150.  Post prandial sugars are generally <150 and he has had no significant hypoglycemia.   Review of Systems See HPI  Past Medical History  Diagnosis Date  . Diabetes mellitus   . History of chicken pox   . GERD (gastroesophageal reflux disease)   . Allergy   . Hyperlipidemia   . Hypertension     History   Social History  . Marital Status: Married    Spouse Name: N/A    Number of Children: 1  . Years of Education: N/A   Occupational History  . Not on file.   Social History Main Topics  . Smoking status: Former Smoker    Types: Cigarettes    Quit date: 09/01/1965  . Smokeless tobacco: Never Used  . Alcohol Use: Not on file  . Drug Use: No  . Sexually Active: Not on file   Other Topics Concern  . Not on file   Social History Narrative   Regular exercise:  5 x weekly   Caffeine Use: 1 cups coffee daily.   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   Married- One son and grandson- both live with them   Wife has ovarian cancer             Past Surgical History  Procedure Laterality Date  . Shoulder surgery  1990    right shoulder, torn rotator cuff.  . Eye surgery  1998    bilateral    Family History  Problem Relation Age of Onset  . Cancer Mother     cancer  . Heart disease Mother   . Hyperlipidemia Mother   . Hypertension  Mother   . Hyperlipidemia Father   . Stroke Father   . Hypertension Father   . Diabetes Paternal Grandfather     No Known Allergies  Current Outpatient Prescriptions on File Prior to Visit  Medication Sig Dispense Refill  . albuterol (PROVENTIL HFA;VENTOLIN HFA) 108 (90 BASE) MCG/ACT inhaler Inhale 2 puffs into the lungs every 6 (six) hours as needed for wheezing.  3.7 g  0  . amLODipine (NORVASC) 5 MG tablet TAKE 1 TABLET (5 MG TOTAL) BY MOUTH DAILY.  30 tablet  3  . aspirin 81 MG tablet Take 81 mg by mouth daily.        . Calcium Carbonate-Vitamin D (CALTRATE 600+D) 600-400 MG-UNIT per tablet Take 1 tablet by mouth 2 (two) times daily.        . fluticasone (FLONASE) 50 MCG/ACT nasal spray Place 2 sprays into the nose daily.      . Fluticasone Propionate, Inhal, (FLOVENT DISKUS) 250 MCG/BLIST AEPB Inhale 1 puff into the lungs 2 (two) times daily.  28 each  0  . insulin detemir (LEVEMIR) 100 UNIT/ML injection Take 25  units in the morning and 27 units in the evening and titrate up 2 units every 3 days until fasting blood sugar is less than 110 then stay at that dose.      . insulin lispro (HUMALOG) 100 UNIT/ML injection Use 10-15 units 3 times daily with sliding scale  20 mL  4  . Insulin Syringe-Needle U-100 30G X 3/8" 1 ML MISC Use with insulin injections daily.  100 each  2  . lisinopril (PRINIVIL,ZESTRIL) 10 MG tablet TAKE 1 TABLET (10 MG TOTAL) BY MOUTH DAILY.  90 tablet  1  . metoprolol succinate (TOPROL-XL) 25 MG 24 hr tablet TAKE 1 TABLET (25 MG TOTAL) BY MOUTH DAILY.  30 tablet  3  . pravastatin (PRAVACHOL) 80 MG tablet TAKE 1 TABLET (80 MG TOTAL) BY MOUTH DAILY.  30 tablet  2   No current facility-administered medications on file prior to visit.    BP 136/84  Pulse 56  Temp(Src) 98.2 F (36.8 C) (Oral)  Resp 16  Ht 5\' 9"  (1.753 m)  Wt 182 lb 1.3 oz (82.591 kg)  BMI 26.88 kg/m2  SpO2 99%       Objective:   Physical Exam  Constitutional: He is oriented to person,  place, and time. He appears well-developed and well-nourished.  Cardiovascular: Normal rate and regular rhythm.   No murmur heard. Pulmonary/Chest: Effort normal and breath sounds normal. No respiratory distress. He has no wheezes. He has no rales. He exhibits no tenderness.  Neurological: He is alert and oriented to person, place, and time.  Psychiatric: He has a normal mood and affect. His behavior is normal. Judgment and thought content normal.          Assessment & Plan:

## 2013-01-09 NOTE — Patient Instructions (Addendum)
Please complete your lab work prior to leaving. Please schedule a follow up appointment in 3 months.  

## 2013-01-09 NOTE — Assessment & Plan Note (Signed)
Stable with med changes, heart rate improved.  Continue amlodipine, lisinopril and toprol xl.

## 2013-01-09 NOTE — Assessment & Plan Note (Signed)
Improving, but AM fasting sugars appear above goal.  Will obtain A1C.  Continue levemir and humalog.

## 2013-01-09 NOTE — Assessment & Plan Note (Signed)
Tolerating statin without myalgia.  Continue same, obtain Lipids/lft.

## 2013-01-11 ENCOUNTER — Encounter: Payer: Self-pay | Admitting: Family

## 2013-01-24 ENCOUNTER — Other Ambulatory Visit: Payer: Self-pay | Admitting: Family

## 2013-02-02 ENCOUNTER — Other Ambulatory Visit: Payer: Self-pay | Admitting: Family

## 2013-02-02 NOTE — Telephone Encounter (Signed)
humalog   Needs refill today as he will be out before Monday

## 2013-02-02 NOTE — Telephone Encounter (Signed)
Refill sent, 20mL x 4 refills. Notified Kiana in the pharmacy.

## 2013-02-15 ENCOUNTER — Other Ambulatory Visit: Payer: Self-pay

## 2013-03-02 ENCOUNTER — Other Ambulatory Visit: Payer: Self-pay | Admitting: Family

## 2013-03-02 NOTE — Telephone Encounter (Signed)
Rx request to pharmacy/SLS  

## 2013-04-10 ENCOUNTER — Ambulatory Visit: Payer: Medicare Other | Admitting: Family

## 2013-04-20 ENCOUNTER — Ambulatory Visit: Payer: Self-pay | Admitting: Family

## 2013-04-30 ENCOUNTER — Other Ambulatory Visit: Payer: Self-pay | Admitting: Family

## 2013-05-07 ENCOUNTER — Encounter: Payer: Self-pay | Admitting: Family

## 2013-05-07 ENCOUNTER — Ambulatory Visit (HOSPITAL_BASED_OUTPATIENT_CLINIC_OR_DEPARTMENT_OTHER)
Admission: RE | Admit: 2013-05-07 | Discharge: 2013-05-07 | Disposition: A | Discharge status: Home / Self Care | Payer: Medicare Other | Source: Ambulatory Visit | Attending: Family | Admitting: Family

## 2013-05-07 ENCOUNTER — Ambulatory Visit (INDEPENDENT_AMBULATORY_CARE_PROVIDER_SITE_OTHER): Payer: Medicare Other | Admitting: Family

## 2013-05-07 VITALS — BP 120/82 | HR 58 | Temp 97.8°F | Resp 18 | Ht 69.0 in | Wt 183.0 lb

## 2013-05-07 DIAGNOSIS — R059 Cough, unspecified: Secondary | ICD-10-CM | POA: Insufficient documentation

## 2013-05-07 DIAGNOSIS — R05 Cough: Secondary | ICD-10-CM

## 2013-05-07 DIAGNOSIS — I10 Essential (primary) hypertension: Secondary | ICD-10-CM

## 2013-05-07 DIAGNOSIS — E785 Hyperlipidemia, unspecified: Secondary | ICD-10-CM

## 2013-05-07 DIAGNOSIS — Z23 Encounter for immunization: Secondary | ICD-10-CM

## 2013-05-07 DIAGNOSIS — E119 Type 2 diabetes mellitus without complications: Secondary | ICD-10-CM

## 2013-05-07 DIAGNOSIS — J8410 Pulmonary fibrosis, unspecified: Secondary | ICD-10-CM | POA: Insufficient documentation

## 2013-05-07 LAB — BASIC METABOLIC PANEL
BUN: 14 mg/dL (ref 6–23)
CO2: 32 mEq/L (ref 19–32)
Calcium: 10.1 mg/dL (ref 8.4–10.5)
Chloride: 101 mEq/L (ref 96–112)
Creat: 0.96 mg/dL (ref 0.50–1.35)
Glucose, Bld: 59 mg/dL — ABNORMAL LOW (ref 70–99)
Potassium: 4.7 mEq/L (ref 3.5–5.3)
Sodium: 138 mEq/L (ref 135–145)

## 2013-05-07 LAB — HEMOGLOBIN A1C
Hgb A1c MFr Bld: 7 % — ABNORMAL HIGH (ref <5.7)
Mean Plasma Glucose: 154 mg/dL — ABNORMAL HIGH (ref <117)

## 2013-05-07 MED ORDER — AZITHROMYCIN 250 MG PO TABS: ORAL_TABLET | ORAL | Status: DC

## 2013-05-07 NOTE — Assessment & Plan Note (Signed)
BP stable on current meds. Continue same.  

## 2013-05-07 NOTE — Progress Notes (Signed)
Subjective:    Patient ID: Ryan [name], male    DOB: 1946/01/05, 67 y.o.   MRN: [id number]  HPI  Ryan [name] is a 67 yr old male who presents today for follow up. He also wishes to discuss cough.  1)  DM2- last A1C 6.9.  Maintained on levimir and humalog. Last eye exam was >1 year ago.  He will schedule appointment. He brings with him today his sugar log.  AM fasting sugars generally running in the low 100's. A few have been under 100.   2)  HTN- currently on ACE, amlodipine, beta blocker. He denies CP/SOB.    3) Hyperlipidemia- Maintained on pravastatin.   LDL in June was at goal at 95. Denies myalgia.    4) Cough- Has been present x 3 weeks.  Associated with sinus drainage.  Not improved by use of mucinex. Has some anterior chest soreness which he attributes to cough.  Nasal drainage is clear. Cough is productive of clear sputum.  Denies fever.  Energy is poor.    Review of Systems    see HPI  Past Medical History  Diagnosis Date  . Diabetes mellitus   . History of chicken pox   . GERD (gastroesophageal reflux disease)   . Allergy   . Hyperlipidemia   . Hypertension     History   Social History  . Marital Status: Married    Spouse Name: N/A    Number of Children: 1  . Years of Education: N/A   Occupational History  . Not on file.   Social History Main Topics  . Smoking status: Former Smoker    Types: Cigarettes    Quit date: 09/01/1965  . Smokeless tobacco: Never Used  . Alcohol Use: Not on file  . Drug Use: No  . Sexual Activity: Not on file   Other Topics Concern  . Not on file   Social History Narrative   Regular exercise:  5 x weekly   Caffeine Use: 1 cups coffee daily.   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   Married- One son and grandson- both live with them   Wife has ovarian cancer             Past Surgical History  Procedure Laterality Date  . Shoulder surgery  1990    right shoulder, torn rotator cuff.  . Eye surgery  1998    bilateral     Family History  Problem Relation Age of Onset  . Cancer Mother     cancer  . Heart disease Mother   . Hyperlipidemia Mother   . Hypertension Mother   . Hyperlipidemia Father   . Stroke Father   . Hypertension Father   . Diabetes Paternal Grandfather     No Known Allergies  Current Outpatient Prescriptions on File Prior to Visit  Medication Sig Dispense Refill  . albuterol (PROVENTIL HFA;VENTOLIN HFA) 108 (90 BASE) MCG/ACT inhaler Inhale 2 puffs into the lungs every 6 (six) hours as needed for wheezing.  3.7 g  0  . amLODipine (NORVASC) 5 MG tablet TAKE 1 TABLET (5 MG TOTAL) BY MOUTH DAILY.  30 tablet  3  . aspirin 81 MG tablet Take 81 mg by mouth daily.        . Calcium Carbonate-Vitamin D (CALTRATE 600+D) 600-400 MG-UNIT per tablet Take 1 tablet by mouth 2 (two) times daily.        . fluticasone (FLONASE) 50 MCG/ACT nasal spray Place 2 sprays into the nose  daily.      . Fluticasone Propionate, Inhal, (FLOVENT DISKUS) 250 MCG/BLIST AEPB Inhale 1 puff into the lungs 2 (two) times daily.  28 each  0  . insulin lispro (HUMALOG) 100 UNIT/ML injection Use 10-15 units three times a day with sliding scale  20 mL  4  . Insulin Syringe-Needle U-100 30G X 3/8" 1 ML MISC Use with insulin injections daily.  100 each  2  . LEVEMIR 100 UNIT/ML injection INJECT 25 UNITS IN THE AM & 27 UNITS IN THE PM & TITRATE UP 2 UNITS EVERY 3 DAYS UNTIL FASTING BLOOD SUGAR IS LESS THAN 110 THEN STAY AT THA  20 mL  2  . lisinopril (PRINIVIL,ZESTRIL) 10 MG tablet TAKE 1 TABLET (10 MG TOTAL) BY MOUTH DAILY.  90 tablet  1  . metoprolol succinate (TOPROL-XL) 25 MG 24 hr tablet TAKE 1 TABLET (25 MG TOTAL) BY MOUTH DAILY.  30 tablet  3  . pravastatin (PRAVACHOL) 80 MG tablet TAKE 1 TABLET BY MOUTH DAILY.  30 tablet  2   No current facility-administered medications on file prior to visit.    BP 120/82  Pulse 58  Temp(Src) 97.8 F (36.6 C) (Oral)  Resp 18  Ht 5' 9"$  (1.753 m)  Wt 183 lb (83.008 kg)  BMI 27.01  kg/m2  SpO2 97%    Objective:   Physical Exam  Constitutional: He is oriented to person, place, and time. He appears well-developed and well-nourished. No distress.  HENT:  Head: Normocephalic and atraumatic.  Right Ear: Tympanic membrane and ear canal normal.  Left Ear: Tympanic membrane and ear canal normal.  Mouth/Throat: No oropharyngeal exudate or posterior oropharyngeal edema.  Cardiovascular: Normal rate and regular rhythm.   No murmur heard. Pulmonary/Chest: No respiratory distress. He has no decreased breath sounds. He has no wheezes. He has no rhonchi. He has rales in the left lower field.  Musculoskeletal: He exhibits no edema.  Neurological: He is alert and oriented to person, place, and time.  Psychiatric: He has a normal mood and affect. His behavior is normal. Judgment and thought content normal.          Assessment & Plan:

## 2013-05-07 NOTE — Assessment & Plan Note (Signed)
Exam suspicious for PNA, obtain stat cxr and plan abx pending results.

## 2013-05-07 NOTE — Assessment & Plan Note (Signed)
At goal. Continue statin.

## 2013-05-07 NOTE — Addendum Note (Signed)
Addended by: Sandford Craze on: 05/07/2013 04:43 PM   Modules accepted: Orders

## 2013-05-07 NOTE — Patient Instructions (Addendum)
Please complete your chest x ray on the first floor. Call if cough worsens or if not improved in 1 week.  Schedule skin biopsy at the front desk at your earliest convenience. Schedule routine follow up in 3 months.

## 2013-05-07 NOTE — Assessment & Plan Note (Signed)
Sugars look good.  Obtain A1C, pt will schedule eye exam.   Check bmet, a1c, flu shot today.

## 2013-05-11 ENCOUNTER — Encounter: Payer: Self-pay | Admitting: Family

## 2013-05-11 ENCOUNTER — Ambulatory Visit (INDEPENDENT_AMBULATORY_CARE_PROVIDER_SITE_OTHER): Payer: Medicare Other | Admitting: Family

## 2013-05-11 VITALS — BP 116/84 | HR 57 | Temp 97.4°F | Resp 16 | Ht 69.0 in | Wt 185.0 lb

## 2013-05-11 DIAGNOSIS — L989 Disorder of the skin and subcutaneous tissue, unspecified: Secondary | ICD-10-CM | POA: Insufficient documentation

## 2013-05-11 DIAGNOSIS — Z23 Encounter for immunization: Secondary | ICD-10-CM

## 2013-05-11 NOTE — Patient Instructions (Addendum)
Please keep area clean and dry for 24 hours and contact us if he develops redness, drainage or swelling at the site.  You use tylenol as needed for discomfort today.  We will contact you with biopsy results.

## 2013-05-11 NOTE — Progress Notes (Signed)
  Subjective:    Patient ID: Ryan [name], male    DOB: February 13, 1946, 67 y.o.   MRN: [id number]  HPI  Ryan [name] is a 67 yr old male who presents today for biopsy of skin lesion from the left forearm. Lesion has been present x 3-4 months.  Review of Systems     Objective:   Physical Exam  Constitutional: He appears well-developed and well-nourished. No distress.  Skin:  + raised, dry hypopigmented lesion noted on the left forearm.           Assessment & Plan:

## 2013-05-11 NOTE — Addendum Note (Signed)
Addended by: Mervin Kung A on: 05/11/2013 03:39 PM   Modules accepted: Orders

## 2013-05-11 NOTE — Assessment & Plan Note (Signed)
Will plan to remove skin lesion on the left forearm.  Procedure including risks/benefits explained to patient.  Questions were answered. After informed consent was obtained and a time out completed, site was cleansed with betadine and then alcohol. 1% Lidocaine with epinephrine was injected under lesion and then shave biopsy was performed. Area was cauterized to obtain hemostasis.  Pt tolerated procedure well.  Specimen sent for pathology review.  Pt instructed to keep the area dry for 24 hours and to contact us if he develops redness, drainage or swelling at the site.  Pt may use tylenol as needed for discomfort today.

## 2013-05-14 ENCOUNTER — Ambulatory Visit: Payer: Medicare Other | Admitting: Family

## 2013-05-14 ENCOUNTER — Telehealth: Payer: Self-pay | Admitting: Family

## 2013-05-14 NOTE — Telephone Encounter (Signed)
Noted  

## 2013-05-14 NOTE — Telephone Encounter (Signed)
According to meaningful use letter this patient is due for a1c on 12-3 and urine microalbumin on 11-13.  Also he needs a foot exam and ophthalmology exam noted in his chart.  He is coming in on 12-22

## 2013-05-16 ENCOUNTER — Other Ambulatory Visit: Payer: Self-pay | Admitting: Family

## 2013-06-01 ENCOUNTER — Other Ambulatory Visit: Payer: Self-pay | Admitting: Family

## 2013-06-28 ENCOUNTER — Other Ambulatory Visit: Payer: Self-pay | Admitting: Family

## 2013-07-30 ENCOUNTER — Ambulatory Visit: Payer: Medicare Other | Admitting: Family

## 2013-08-22 ENCOUNTER — Other Ambulatory Visit: Payer: Self-pay | Admitting: Family

## 2013-08-23 NOTE — Telephone Encounter (Signed)
Rx request to pharmacy/SLS  

## 2013-10-03 ENCOUNTER — Ambulatory Visit: Payer: Medicare Other | Admitting: Family

## 2013-10-05 ENCOUNTER — Ambulatory Visit: Payer: Medicare Other | Admitting: Family

## 2013-10-09 ENCOUNTER — Ambulatory Visit (INDEPENDENT_AMBULATORY_CARE_PROVIDER_SITE_OTHER): Payer: Commercial Managed Care - HMO | Admitting: Family

## 2013-10-09 ENCOUNTER — Encounter: Payer: Self-pay | Admitting: Family

## 2013-10-09 VITALS — BP 118/76 | HR 63 | Temp 97.6°F | Resp 18 | Ht 69.0 in | Wt 182.8 lb

## 2013-10-09 DIAGNOSIS — E785 Hyperlipidemia, unspecified: Secondary | ICD-10-CM

## 2013-10-09 DIAGNOSIS — E119 Type 2 diabetes mellitus without complications: Secondary | ICD-10-CM

## 2013-10-09 DIAGNOSIS — I10 Essential (primary) hypertension: Secondary | ICD-10-CM

## 2013-10-09 LAB — HEMOGLOBIN A1C
Hgb A1c MFr Bld: 7.3 % — ABNORMAL HIGH (ref <5.7)
Mean Plasma Glucose: 163 mg/dL — ABNORMAL HIGH (ref <117)

## 2013-10-09 NOTE — Assessment & Plan Note (Signed)
Stable, obtain A1C, urine microalbumin.  Given sample of humalog kwick pen.

## 2013-10-09 NOTE — Assessment & Plan Note (Signed)
Tolerating statin- continue same.  obtain flp/lft.

## 2013-10-09 NOTE — Progress Notes (Signed)
Pre visit review using our clinic review tool, if applicable. No additional management support is needed unless otherwise documented below in the visit note. 

## 2013-10-09 NOTE — Patient Instructions (Signed)
Please complete lab work prior to leaving. Follow up in 3 months.  

## 2013-10-09 NOTE — Progress Notes (Signed)
Subjective:    Patient ID: Ryan [name], male    DOB: 08/26/1945, 68 y.o.   MRN: [id number]  HPI  [name] is a 68 yr old male who presents today for follow up.  1) DM2- last A1C was 7.  Maintained on humalog and levemir.  Brings log from home AM sugars ranging 78-200, post prandial 74- 175. Eye exam is up to date.  Due for urine microalbumin, Due for diabetic foot exam  2) HTN- Current BP meds include lisinopril, amlodipine and metoprolol. Denies SOB/chest pain, edema BP Readings from Last 3 Encounters:  10/09/13 118/76  05/11/13 116/84  05/07/13 120/82   3) Hyperlipidemia-  Currently on pravastatin-  Last LDL was 95.  Denies myalgia.     Review of Systems See HPI  Past Medical History  Diagnosis Date  . Diabetes mellitus   . History of chicken pox   . GERD (gastroesophageal reflux disease)   . Allergy   . Hyperlipidemia   . Hypertension     History   Social History  . Marital Status: Married    Spouse Name: N/A    Number of Children: 1  . Years of Education: N/A   Occupational History  . Not on file.   Social History Main Topics  . Smoking status: Former Smoker    Types: Cigarettes    Quit date: 09/01/1965  . Smokeless tobacco: Never Used  . Alcohol Use: Not on file  . Drug Use: No  . Sexual Activity: Not on file   Other Topics Concern  . Not on file   Social History Narrative   Regular exercise:  5 x weekly   Caffeine Use: 1 cups coffee daily.   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   Married- One son and grandson- both live with them   Wife has ovarian cancer             Past Surgical History  Procedure Laterality Date  . Shoulder surgery  1990    right shoulder, torn rotator cuff.  . Eye surgery  1998    bilateral    Family History  Problem Relation Age of Onset  . Cancer Mother     cancer  . Heart disease Mother   . Hyperlipidemia Mother   . Hypertension Mother   . Hyperlipidemia Father   . Stroke Father   . Hypertension Father     . Diabetes Paternal Grandfather     No Known Allergies  Current Outpatient Prescriptions on File Prior to Visit  Medication Sig Dispense Refill  . albuterol (PROVENTIL HFA;VENTOLIN HFA) 108 (90 BASE) MCG/ACT inhaler Inhale 2 puffs into the lungs every 6 (six) hours as needed for wheezing.  3.7 g  0  . amLODipine (NORVASC) 5 MG tablet TAKE 1 TABLET BY MOUTH DAILY.  30 tablet  3  . aspirin 81 MG tablet Take 81 mg by mouth daily.        Marland Kitchen. azithromycin (ZITHROMAX) 250 MG tablet 2 tabs by mouth today, then one tablet by mouth daily for 4 more days.  6 tablet  0  . Calcium Carbonate-Vitamin D (CALTRATE 600+D) 600-400 MG-UNIT per tablet Take 1 tablet by mouth 2 (two) times daily.        . fluticasone (FLONASE) 50 MCG/ACT nasal spray Place 2 sprays into the nose daily.      . Fluticasone Propionate, Inhal, (FLOVENT DISKUS) 250 MCG/BLIST AEPB Inhale 1 puff into the lungs 2 (two) times daily.  28 each  0  . HUMALOG 100 UNIT/ML injection USE 10-15 UNITS 3 TIMES A DAY WITH A SLIDING SCALE  20 mL  3  . Insulin Syringe-Needle U-100 30G X 3/8" 1 ML MISC Use with insulin injections daily.  100 each  2  . LEVEMIR 100 UNIT/ML injection INJECT 25 UNITS IN THE AM & 27 UNITS IN THE PM & TITRATE UP 2 UNITS EVERY 3 DAYS UNTIL FASTING BLOOD SUGAR IS LESS THAN 110 THEN STAY AT THA  20 mL  2  . lisinopril (PRINIVIL,ZESTRIL) 10 MG tablet TAKE 1 TABLET (10 MG TOTAL) BY MOUTH DAILY.  90 tablet  1  . metoprolol succinate (TOPROL-XL) 25 MG 24 hr tablet TAKE 1 TABLET (25 MG TOTAL) BY MOUTH DAILY.  30 tablet  3  . pravastatin (PRAVACHOL) 80 MG tablet TAKE 1 TABLET BY MOUTH DAILY.  30 tablet  3   No current facility-administered medications on file prior to visit.    BP 118/76  Pulse 63  Temp(Src) 97.6 F (36.4 C) (Oral)  Resp 18  Ht 5\' 9"  (1.753 m)  Wt 182 lb 12.8 oz (82.918 kg)  BMI 26.98 kg/m2  SpO2 97%       Objective:   Physical Exam  Constitutional: He is oriented to person, place, and time. He appears  well-developed and well-nourished. No distress.  HENT:  Head: Normocephalic and atraumatic.  Cardiovascular: Normal rate and regular rhythm.   No murmur heard. Pulmonary/Chest: Effort normal and breath sounds normal. No respiratory distress. He has no wheezes. He has no rales. He exhibits no tenderness.  Musculoskeletal: He exhibits no edema.  Neurological: He is alert and oriented to person, place, and time.  Psychiatric: He has a normal mood and affect. His behavior is normal. Judgment and thought content normal.          Assessment & Plan:

## 2013-10-09 NOTE — Assessment & Plan Note (Signed)
BP stable on current meds.   

## 2013-10-10 ENCOUNTER — Telehealth: Payer: Self-pay | Admitting: Family

## 2013-10-10 ENCOUNTER — Telehealth: Payer: Self-pay

## 2013-10-10 LAB — HEPATIC FUNCTION PANEL
ALT: 21 U/L (ref 0–53)
AST: 24 U/L (ref 0–37)
Albumin: 4.2 g/dL (ref 3.5–5.2)
Alkaline Phosphatase: 54 U/L (ref 39–117)
Bilirubin, Direct: 0.2 mg/dL (ref 0.0–0.3)
Indirect Bilirubin: 0.8 mg/dL (ref 0.2–1.2)
Total Bilirubin: 1 mg/dL (ref 0.2–1.2)
Total Protein: 7.1 g/dL (ref 6.0–8.3)

## 2013-10-10 LAB — BASIC METABOLIC PANEL WITH GFR
BUN: 15 mg/dL (ref 6–23)
CO2: 28 mEq/L (ref 19–32)
Calcium: 9.6 mg/dL (ref 8.4–10.5)
Chloride: 106 mEq/L (ref 96–112)
Creat: 0.87 mg/dL (ref 0.50–1.35)
GFR, Est African American: >89 mL/min
GFR, Est Non African American: 89 mL/min
Glucose, Bld: 43 mg/dL — CL (ref 70–99)
Potassium: 4.1 mEq/L (ref 3.5–5.3)
Sodium: 138 mEq/L (ref 135–145)

## 2013-10-10 LAB — LIPID PANEL
Cholesterol: 147 mg/dL (ref 0–200)
HDL: 37 mg/dL — ABNORMAL LOW (ref >39)
LDL Cholesterol: 94 mg/dL (ref 0–99)
Total CHOL/HDL Ratio: 4 Ratio
Triglycerides: 78 mg/dL (ref <150)
VLDL: 16 mg/dL (ref 0–40)

## 2013-10-10 LAB — MICROALBUMIN / CREATININE URINE RATIO
Creatinine, Urine: 223.5 mg/dL
Microalb Creat Ratio: 7.1 mg/g (ref 0.0–30.0)
Microalb, Ur: 1.59 mg/dL (ref 0.00–1.89)

## 2013-10-10 NOTE — Telephone Encounter (Signed)
Relevant patient education assigned to patient using Emmi. ° °

## 2013-10-10 NOTE — Telephone Encounter (Signed)
Notified pt. He states BS was 117 prior to coming in to his appt yesterday and was 99 this morning. He will continue to monitor BS and will let us know if he has any further low blood sugar readings.

## 2013-10-10 NOTE — Telephone Encounter (Addendum)
Please contact pt this AM and let him know that the lab work showed that his sugar was 43 at the time it was checked.  Please ask him to check sugar currently.  Contact us if he has any further lows at home.   A1C 7.3- slightly above goal. Given low sugar in office, I will not increase his insulin dose at this time.

## 2013-10-19 ENCOUNTER — Encounter: Payer: Self-pay | Admitting: Family

## 2013-10-23 ENCOUNTER — Other Ambulatory Visit: Payer: Self-pay | Admitting: Family

## 2013-10-23 NOTE — Telephone Encounter (Signed)
Requesting humalog refill sent downstairs today

## 2013-11-10 IMAGING — CR DG CHEST 2V
2 series · 2 of 2 positions shown · non-contrast
Comparison: None.
COMPARISON: None.

***ADDENDUM*** CREATED: 07/13/2011 [DATE]
CLINICAL DATA: Cough, chills, congestion

CHEST - 2 VIEW
CLINICAL DATA: Cough, congestion is, chills, hypertension diabetes

[w chest pa]
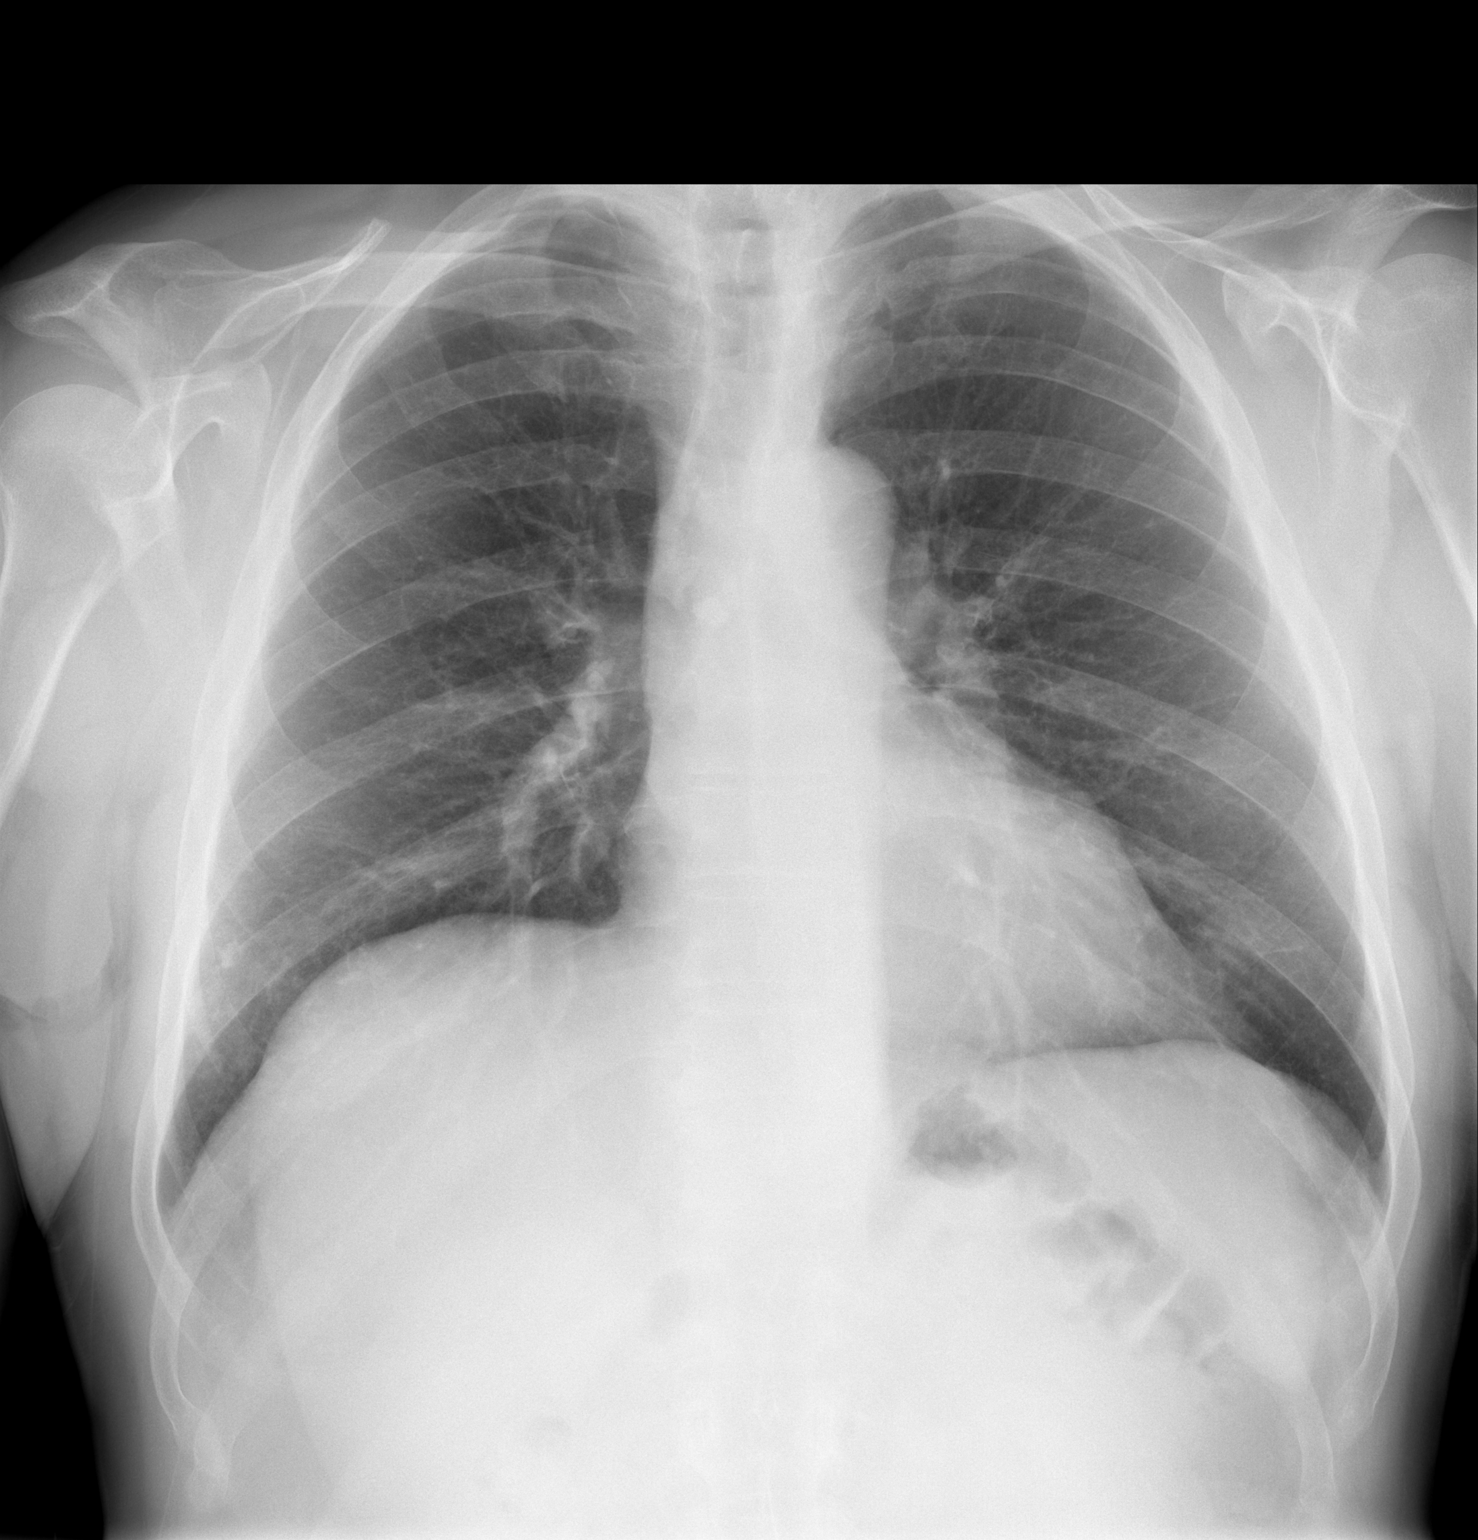

[w chest lat]
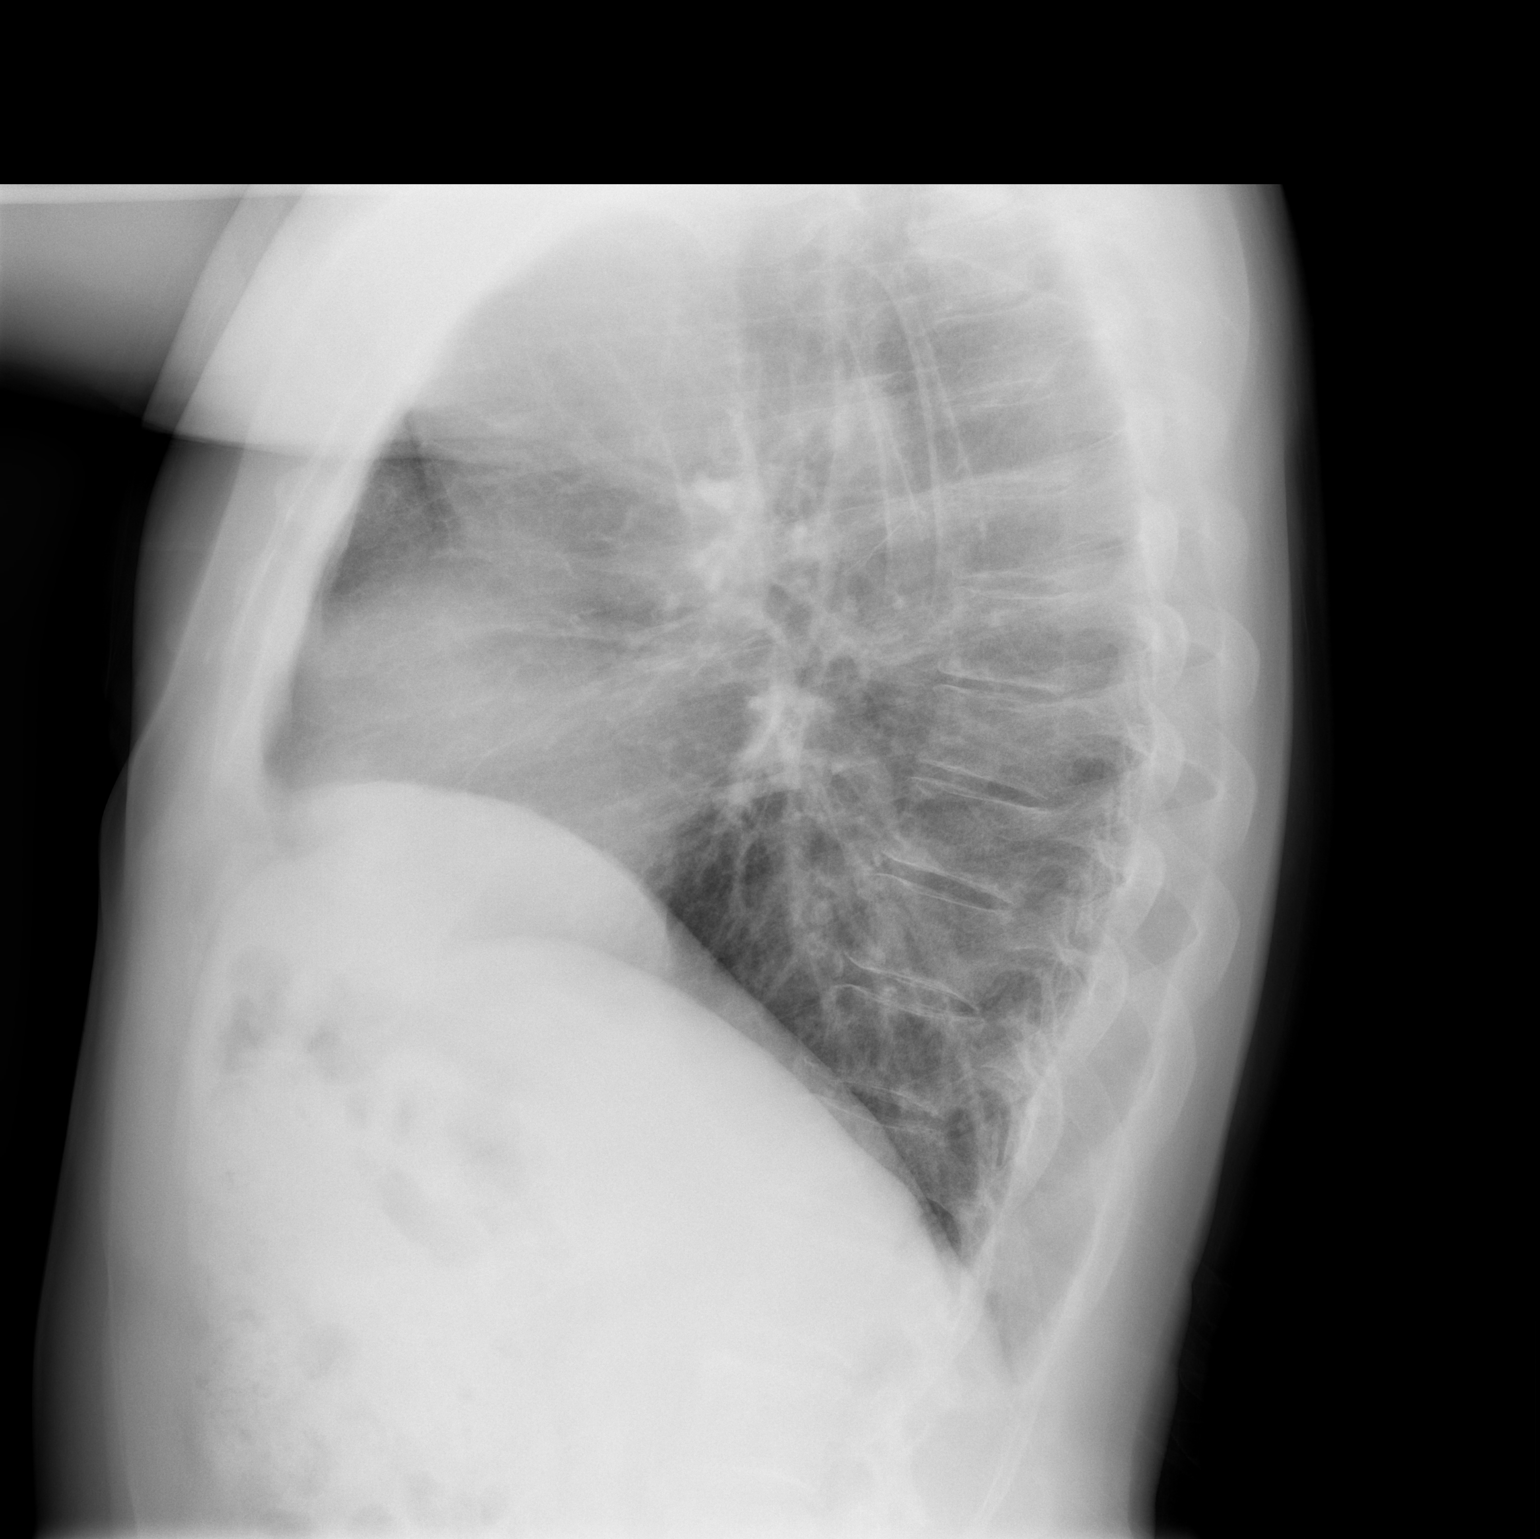

[2 of 2 positions shown; findings below may reference images not displayed]

FINDINGS: The heart size and mediastinal contours are within
normal limits.  Both lungs are clear.  The visualized skeletal
structures are unremarkable.
IMPRESSION: No active cardiopulmonary disease.

***END ADDENDUM*** SIGNED BY: Andjus Panajot, M.D.
FINDINGS: The cardiac silhouette, mediastinum, pulmonary
vasculature are within normal limits.  Both lungs are clear.
There is no acute bony abnormality.
IMPRESSION: There is no evidence of acute cardiac or pulmonary process.

## 2013-11-23 ENCOUNTER — Other Ambulatory Visit: Payer: Self-pay | Admitting: Family

## 2013-12-21 ENCOUNTER — Other Ambulatory Visit: Payer: Self-pay | Admitting: Family

## 2014-01-15 ENCOUNTER — Ambulatory Visit (INDEPENDENT_AMBULATORY_CARE_PROVIDER_SITE_OTHER): Payer: Commercial Managed Care - HMO | Admitting: Family

## 2014-01-15 ENCOUNTER — Encounter: Payer: Self-pay | Admitting: Family

## 2014-01-15 VITALS — BP 120/86 | HR 57 | Temp 97.8°F | Resp 16 | Ht 69.0 in | Wt 174.0 lb

## 2014-01-15 DIAGNOSIS — E785 Hyperlipidemia, unspecified: Secondary | ICD-10-CM

## 2014-01-15 DIAGNOSIS — H8110 Benign paroxysmal vertigo, unspecified ear: Secondary | ICD-10-CM

## 2014-01-15 DIAGNOSIS — E119 Type 2 diabetes mellitus without complications: Secondary | ICD-10-CM

## 2014-01-15 DIAGNOSIS — I10 Essential (primary) hypertension: Secondary | ICD-10-CM

## 2014-01-15 LAB — BASIC METABOLIC PANEL WITH GFR
BUN: 17 mg/dL (ref 6–23)
CO2: 27 mEq/L (ref 19–32)
Calcium: 9.2 mg/dL (ref 8.4–10.5)
Chloride: 104 mEq/L (ref 96–112)
Creat: 0.93 mg/dL (ref 0.50–1.35)
GFR, Est African American: >89 mL/min
GFR, Est Non African American: 85 mL/min
Glucose, Bld: 150 mg/dL — ABNORMAL HIGH (ref 70–99)
Potassium: 4.9 mEq/L (ref 3.5–5.3)
Sodium: 137 mEq/L (ref 135–145)

## 2014-01-15 LAB — HEMOGLOBIN A1C
Hgb A1c MFr Bld: 7.3 % — ABNORMAL HIGH (ref <5.7)
Mean Plasma Glucose: 163 mg/dL — ABNORMAL HIGH (ref <117)

## 2014-01-15 MED ORDER — MECLIZINE HCL 25 MG PO TABS: 25.0000 mg | ORAL_TABLET | Freq: Three times a day (TID) | ORAL | Status: DC | PRN

## 2014-01-15 MED ORDER — INSULIN LISPRO 100 UNIT/ML ~~LOC~~ SOLN: SUBCUTANEOUS | Status: DC

## 2014-01-15 NOTE — Assessment & Plan Note (Signed)
Reminded pt not to skip meals and to keep snacks on hand to avoid hypoglycemia.  Overall blood sugars look good. Continue same, obtain A1C.

## 2014-01-15 NOTE — Patient Instructions (Signed)
You may use meclizine as needed for dizziness.  Call if symptoms worsen or if symptoms do not improve. Complete lab work prior to leaving.  Follow up in 3 months.

## 2014-01-15 NOTE — Assessment & Plan Note (Signed)
He has mild bradycardia due to beta blocker.  This is not new and I doubt related to his dizziness. Recommended trial of prn meclizine. Advised pt it may cause drowsiness and to take first dose at home.

## 2014-01-15 NOTE — Progress Notes (Signed)
Pre visit review using our clinic review tool, if applicable. No additional management support is needed unless otherwise documented below in the visit note. 

## 2014-01-15 NOTE — Assessment & Plan Note (Signed)
BP stable on current meds. Continue same.  

## 2014-01-15 NOTE — Assessment & Plan Note (Signed)
Stable on pravastatin.  Obtain flp.

## 2014-01-15 NOTE — Addendum Note (Signed)
Addended by: Mervin Kung A on: 01/15/2014 10:18 AM   Modules accepted: Orders

## 2014-01-15 NOTE — Progress Notes (Signed)
Subjective:    Patient ID: Ryan [name], male    DOB: 10/22/1945, 68 y.o.   MRN: [id number]  HPI  [name] is a 68 yr old male who presents today for follow up of multiple medical problems.  1) DM2- maintained on humalog and levemir.  Sugar log is reviewed.Fasting sugars last 2 weeks 92-169, 2 hours after dinner 75-180.  Reports that he had a sugar drop to 33 on one time in the morning.  Had not eaten breakfast.  Now keeps snacks in the car.   Wt Readings from Last 3 Encounters:  01/15/14 174 lb (78.926 kg)  10/09/13 182 lb 12.8 oz (82.918 kg)  05/11/13 185 lb 0.6 oz (83.934 kg)    Lab Results  Component Value Date   HGBA1C 7.3* 10/09/2013   2) HTN- on lisinopril, amlodipine and metoprolol.  Denies CP/sob or swelling.  BP Readings from Last 3 Encounters:  01/15/14 120/86  10/09/13 118/76  05/11/13 116/84    3) Hyperlipidemia- on pravastatin-  Denies myalgia Lab Results  Component Value Date   CHOL 147 10/09/2013   HDL 37* 10/09/2013   LDLCALC 94 10/09/2013   TRIG 78 10/09/2013   CHOLHDL 4.0 10/09/2013   4) Dizziness-worse with laying flat.  If he gets up slowly his fine.  Reprts that these symptoms started about 1 month ago.  He does reports some allergy symptoms.  He takes zyrtec once daily.    Orthostatics today:   Laying  132/93  HR 56 Sitting   132/83  HR 55 Stand    125/81  HR 59  Denies current wheezing.   Review of Systems See HPI  Past Medical History  Diagnosis Date  . Diabetes mellitus   . History of chicken pox   . GERD (gastroesophageal reflux disease)   . Allergy   . Hyperlipidemia   . Hypertension     History   Social History  . Marital Status: Married    Spouse Name: N/A    Number of Children: 1  . Years of Education: N/A   Occupational History  . Not on file.   Social History Main Topics  . Smoking status: Former Smoker    Types: Cigarettes    Quit date: 09/01/1965  . Smokeless tobacco: Never Used  . Alcohol Use: Not on file  . Drug  Use: No  . Sexual Activity: Not on file   Other Topics Concern  . Not on file   Social History Narrative   Regular exercise:  5 x weekly   Caffeine Use: 1 cups coffee daily.   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   Married- One son and grandson- both live with them   Wife has ovarian cancer             Past Surgical History  Procedure Laterality Date  . Shoulder surgery  1990    right shoulder, torn rotator cuff.  . Eye surgery  1998    bilateral    Family History  Problem Relation Age of Onset  . Cancer Mother     cancer  . Heart disease Mother   . Hyperlipidemia Mother   . Hypertension Mother   . Hyperlipidemia Father   . Stroke Father   . Hypertension Father   . Diabetes Paternal Grandfather     No Known Allergies  Current Outpatient Prescriptions on File Prior to Visit  Medication Sig Dispense Refill  . albuterol (PROVENTIL HFA;VENTOLIN HFA) 108 (90 BASE) MCG/ACT inhaler Inhale  2 puffs into the lungs every 6 (six) hours as needed for wheezing.  3.7 g  0  . amLODipine (NORVASC) 5 MG tablet TAKE 1 TABLET BY MOUTH DAILY.  30 tablet  3  . aspirin 81 MG tablet Take 81 mg by mouth daily.        . Calcium Carbonate-Vitamin D (CALTRATE 600+D) 600-400 MG-UNIT per tablet Take 1 tablet by mouth 2 (two) times daily.        . fluticasone (FLONASE) 50 MCG/ACT nasal spray Place 2 sprays into the nose daily.      . Fluticasone Propionate, Inhal, (FLOVENT DISKUS) 250 MCG/BLIST AEPB Inhale 1 puff into the lungs 2 (two) times daily.  28 each  0  . HUMALOG 100 UNIT/ML injection INJECT 10-15 UNITS 3 TIMES A DAY WITH A SLIDING SCALE  20 mL  3  . Insulin Syringe-Needle U-100 30G X 3/8" 1 ML MISC Use with insulin injections daily.  100 each  2  . LEVEMIR 100 UNIT/ML injection INJECT 25 UNITS IN THE AM & 27 UNITS IN THE PM & TITRATE UP 2 UNITS EVERY 3 DAYS UNTIL FASTING BLOOD SUGAR IS LESS THAN 110 THEN STAY AT THA  20 mL  2  . lisinopril (PRINIVIL,ZESTRIL) 10 MG tablet TAKE 1 TABLET (10 MG  TOTAL) BY MOUTH DAILY.  90 tablet  1  . metoprolol succinate (TOPROL-XL) 25 MG 24 hr tablet TAKE 1 TABLET (25 MG TOTAL) BY MOUTH DAILY.  30 tablet  3  . pravastatin (PRAVACHOL) 80 MG tablet TAKE 1 TABLET BY MOUTH DAILY.  30 tablet  3   No current facility-administered medications on file prior to visit.    BP 120/86  Pulse 57  Temp(Src) 97.8 F (36.6 C) (Oral)  Resp 16  Ht 5\' 9"  (1.753 m)  Wt 174 lb (78.926 kg)  BMI 25.68 kg/m2  SpO2 98%       Objective:   Physical Exam  Constitutional: He is oriented to person, place, and time. He appears well-developed and well-nourished. No distress.  HENT:  Head: Normocephalic and atraumatic.  Cardiovascular: Normal rate and regular rhythm.   No murmur heard. Pulmonary/Chest: Effort normal and breath sounds normal. No respiratory distress. He has no wheezes. He has no rales. He exhibits no tenderness.  Musculoskeletal: He exhibits no edema.  Neurological: He is alert and oriented to person, place, and time.  Skin: Skin is warm and dry.  Psychiatric: He has a normal mood and affect. His behavior is normal. Judgment and thought content normal.          Assessment & Plan:

## 2014-01-16 ENCOUNTER — Encounter: Payer: Self-pay | Admitting: Family

## 2014-02-04 ENCOUNTER — Ambulatory Visit: Payer: Commercial Managed Care - HMO | Admitting: Family

## 2014-02-26 ENCOUNTER — Other Ambulatory Visit: Payer: Self-pay | Admitting: Family

## 2014-03-04 ENCOUNTER — Telehealth: Payer: Self-pay | Admitting: *Deleted

## 2014-03-04 DIAGNOSIS — E119 Type 2 diabetes mellitus without complications: Secondary | ICD-10-CM

## 2014-03-04 NOTE — Telephone Encounter (Signed)
Notified pt. He voices understanding and is agreeable to proceed with referral. 

## 2014-03-04 NOTE — Telephone Encounter (Signed)
Received call from pt stating he had a hypoglycemic event on Friday. Reports going to the store and BS was 162 before leaving home. Remembers getting in the store and then woke up in an ambulance. States his BS had dropped to 33. Reports that it was only at 15 min drive to the store. Pt states he has had other sporadic low blood sugar readings in the 60s. Low readings generally occur in the evenings "when he has been busy".  Reports that he is taking Levemir 25 units in the morning and 27 units in the evening. Takes 10-15 units of humalog at meals.  Please advise.

## 2014-03-04 NOTE — Telephone Encounter (Signed)
Decrease evening levimir to 22 units.  Will refer to endocrinology. Call if any further episodes of hypoglycemia, make sure not to skip any meals even if busy.

## 2014-03-18 ENCOUNTER — Ambulatory Visit: Payer: Commercial Managed Care - HMO | Admitting: Internal Medicine

## 2014-03-29 ENCOUNTER — Other Ambulatory Visit: Payer: Self-pay | Admitting: Family

## 2014-03-29 NOTE — Telephone Encounter (Signed)
rx sent to pharmacy. LDM

## 2014-04-04 ENCOUNTER — Encounter: Payer: Self-pay | Admitting: Internal Medicine

## 2014-04-04 ENCOUNTER — Ambulatory Visit (INDEPENDENT_AMBULATORY_CARE_PROVIDER_SITE_OTHER): Payer: Commercial Managed Care - HMO | Admitting: Internal Medicine

## 2014-04-04 VITALS — BP 132/78 | HR 69 | Temp 97.8°F | Resp 12 | Ht 68.0 in | Wt 174.0 lb

## 2014-04-04 DIAGNOSIS — E119 Type 2 diabetes mellitus without complications: Secondary | ICD-10-CM

## 2014-04-04 NOTE — Patient Instructions (Signed)
- Change the Levemir to 20 units in am and 25 units at bedtime - Continue Humalog mealtime with an ICR 1:15 units 3x a day 15 min before a meal - Change the Humalog sliding scale as follows:  - 150-175: + 1 unit  - 176-200: + 2 units  - 201-225: + 3 units  - 226-250: + 4 units  - >250: + 5 units Please return in 1 month with your sugar log.   PATIENT INSTRUCTIONS FOR TYPE 2 DIABETES:  **Please join MyChart!** - see attached instructions about how to join if you have not done so already.  DIET AND EXERCISE Diet and exercise is an important part of diabetic treatment.  We recommended aerobic exercise in the form of brisk walking (working between 40-60% of maximal aerobic capacity, similar to brisk walking) for 150 minutes per week (such as 30 minutes five days per week) along with 3 times per week performing 'resistance' training (using various gauge rubber tubes with handles) 5-10 exercises involving the major muscle groups (upper body, lower body and core) performing 10-15 repetitions (or near fatigue) each exercise. Start at half the above goal but build slowly to reach the above goals. If limited by weight, joint pain, or disability, we recommend daily walking in a swimming pool with water up to waist to reduce pressure from joints while allow for adequate exercise.    BLOOD GLUCOSES Monitoring your blood glucoses is important for continued management of your diabetes. Please check your blood glucoses 2-4 times a day: fasting, before meals and at bedtime (you can rotate these measurements - e.g. one day check before the 3 meals, the next day check before 2 of the meals and before bedtime, etc.).   HYPOGLYCEMIA (low blood sugar) Hypoglycemia is usually a reaction to not eating, exercising, or taking too much insulin/ other diabetes drugs.  Symptoms include tremors, sweating, hunger, confusion, headache, etc. Treat IMMEDIATELY with 15 grams of Carbs:   4 glucose tablets    cup regular  juice/soda   2 tablespoons raisins   4 teaspoons sugar   1 tablespoon honey Recheck blood glucose in 15 mins and repeat above if still symptomatic/blood glucose <100.  RECOMMENDATIONS TO REDUCE YOUR RISK OF DIABETIC COMPLICATIONS: * Take your prescribed MEDICATION(S) * Follow a DIABETIC diet: Complex carbs, fiber rich foods, (monounsaturated and polyunsaturated) fats * AVOID saturated/trans fats, high fat foods, >2,300 mg salt per day. * EXERCISE at least 5 times a week for 30 minutes or preferably daily.  * DO NOT SMOKE OR DRINK more than 1 drink a day. * Check your FEET every day. Do not wear tightfitting shoes. Contact us if you develop an ulcer * See your EYE doctor once a year or more if needed * Get a FLU shot once a year * Get a PNEUMONIA vaccine once before and once after age 70 years  GOALS:  * Your Hemoglobin A1c of <7%  * fasting sugars need to be <130 * after meals sugars need to be <180 (2h after you start eating) * Your Systolic BP should be [id number] or lower  * Your Diastolic BP should be 80 or lower  * Your HDL (Good Cholesterol) should be 40 or higher  * Your LDL (Bad Cholesterol) should be 100 or lower. * Your Triglycerides should be 150 or lower  * Your Urine microalbumin (kidney function) should be <30 * Your Body Mass Index should be 25 or lower   We will be glad to help  you achieve these goals. Our telephone number is: [phone].

## 2014-04-04 NOTE — Progress Notes (Signed)
Patient ID: Ryan [name], male   DOB: 08-12-1945, 68 y.o.   MRN: [id number]  HPI: Ryan [name] is a 68 y.o.-year-old male, referred by his PCP, Ryan [name], for management of DM2 (?1), insulin-dependent, uncontrolled, without complications.  Patient has been diagnosed with diabetes in 1999; he started on insulin in 2005, he was on the insulin pump then. He was taken off the pump ~2005 by the [place].   Last hemoglobin A1c was: Lab Results  Component Value Date   HGBA1C 7.3* 01/15/2014   HGBA1C 7.3* 10/09/2013   HGBA1C 7.0* 05/07/2013   Pt is on a regimen of: - Levemir 25 in am and 27 >> 22 at bedtime - Humalog ICR 1:15 (5-10 units) units tid ac - Humalog SSI: target 150 - add 3 units (for am)  Pt checks his sugars >8 a day and they are: - am: 126-200 - 2h after b'fast: 77-150 - before lunch: 70-143 - 2h after lunch: 43 x1, 70-129 - before dinner: 94-150 - 2h after dinner: 87-145 - bedtime: - nighttime: + pm lows. Lowest sugar was 33 >> skipped b'fast; had 3 lows in last 3 mo, mostly in pm. he has hypoglycemia awareness at 60.  Highest sugar was 200.  Pt's meals are: - Breakfast: egg beaters + toast - Lunch: cold cuts or salads - Dinner: meat + veggies - Snacks: fruit Walks 45 miles a week.  - no CKD, last BUN/creatinine:  Lab Results  Component Value Date   BUN 17 01/15/2014   CREATININE 0.93 01/15/2014  On Lisinopril. - last set of lipids: Lab Results  Component Value Date   CHOL 147 10/09/2013   HDL 37* 10/09/2013   LDLCALC 94 10/09/2013   TRIG 78 10/09/2013   CHOLHDL 4.0 10/09/2013  On Pravastatin. - last eye exam was in 09/14/2013. No DR.  - no numbness and tingling in his feet.  Pt has FH of DM in PGF, cousins.  ROS: Constitutional: no weight gain/loss, + fatigue, no subjective hyperthermia/hypothermia, + excessive urination Eyes: no blurry vision, no xerophthalmia ENT: no sore throat, no nodules palpated in throat, no dysphagia/odynophagia, no  hoarseness Cardiovascular: no CP/SOB/palpitations/leg swelling Respiratory: no cough/SOB Gastrointestinal: no N/V/D/C/+ heartburn Musculoskeletal: no muscle/joint aches Skin: no rashes Neurological: no tremors/numbness/tingling/dizziness Psychiatric: no depression/anxiety + low libido  Past Medical History  Diagnosis Date  . Diabetes mellitus   . History of chicken pox   . GERD (gastroesophageal reflux disease)   . Allergy   . Hyperlipidemia   . Hypertension    Past Surgical History  Procedure Laterality Date  . Shoulder surgery  1990    right shoulder, torn rotator cuff.  . Eye surgery  1998    bilateral   History   Social History  . Marital Status: Married    Spouse Name: N/A    Number of Children: 1   Social History Main Topics  . Smoking status: Former Smoker    Types: Cigarettes    Quit date: 09/01/1965  . Smokeless tobacco: Never Used  . Alcohol Use: Not on file  . Drug Use: No   Social History Narrative   Regular exercise:  5 x weekly   Caffeine Use: 1 cups coffee daily.   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   Married- One son and grandson- both live with them, both disabled   Wife is a survivor of ovarian cancer - disabled   Current Outpatient Prescriptions on File Prior to Visit  Medication Sig Dispense Refill  .  amLODipine (NORVASC) 5 MG tablet TAKE 1 TABLET BY MOUTH DAILY.  30 tablet  3  . aspirin 81 MG tablet Take 81 mg by mouth daily.        . Calcium Carbonate-Vitamin D (CALTRATE 600+D) 600-400 MG-UNIT per tablet Take 1 tablet by mouth 2 (two) times daily.        . fluticasone (FLONASE) 50 MCG/ACT nasal spray Place 2 sprays into the nose daily.      Marland Kitchen HUMALOG 100 UNIT/ML injection INJECT 10-15 UNITS 3 TIMES A DAY WITH A SLIDING SCALE  20 mL  3  . Insulin Syringe-Needle U-100 30G X 3/8" 1 ML MISC Use with insulin injections daily.  100 each  2  . LEVEMIR 100 UNIT/ML injection INJECT 25 UNITS IN THE AM & 22   20 mL  2  . lisinopril (PRINIVIL,ZESTRIL)  10 MG tablet TAKE 1 TABLET (10 MG TOTAL) BY MOUTH DAILY.  90 tablet  1  . meclizine (ANTIVERT) 25 MG tablet Take 1 tablet (25 mg total) by mouth 3 (three) times daily as needed for dizziness.  30 tablet  0  . metoprolol succinate (TOPROL-XL) 25 MG 24 hr tablet TAKE 1 TABLET (25 MG TOTAL) BY MOUTH DAILY.  30 tablet  3  . pravastatin (PRAVACHOL) 80 MG tablet TAKE 1 TABLET BY MOUTH DAILY.  30 tablet  3   No current facility-administered medications on file prior to visit.   No Known Allergies Family History  Problem Relation Age of Onset  . Cancer Mother     cancer  . Heart disease Mother   . Hyperlipidemia Mother   . Hypertension Mother   . Hyperlipidemia Father   . Stroke Father   . Hypertension Father   . Diabetes Paternal Grandfather    PE: BP 132/78  Pulse 69  Temp(Src) 97.8 F (36.6 C) (Oral)  Resp 12  Ht  (1.727 m)  Wt 174 lb (78.926 kg)  BMI 26.46 kg/m2  SpO2 97% Wt Readings from Last 3 Encounters:  04/04/14 174 lb (78.926 kg)  01/15/14 174 lb (78.926 kg)  10/09/13 182 lb 12.8 oz (82.918 kg)   Constitutional: normal weight, in NAD Eyes: PERRLA, EOMI, no exophthalmos ENT: moist mucous membranes, no thyromegaly, no cervical lymphadenopathy Cardiovascular: RRR, No MRG Respiratory: CTA B Gastrointestinal: abdomen soft, NT, ND, BS+ Musculoskeletal: no deformities, strength intact in all 4 Skin: moist, warm, no rashes Neurological: no tremor with outstretched hands, DTR normal in all 4  ASSESSMENT: 1. DM2 or 1, insulin-dependent, uncontrolled, without complications  PLAN:  1. Patient with long-standing, fairly well controlled diabetes, on basal-bolus insulin regimen, with several lows in pm. Sugars high in am after decreasing Levemir at bedtime. - We discussed about options for treatment, and I suggested to:  Patient Instructions  - Change the Levemir to 20 units in am and 27 units at bedtime - Continue Humalog mealtime with an ICR 1:15 units 3x a day 15 min  before a meal - Change the Humalog sliding scale as follows:  - 150-175: + 1 unit  - 176-200: + 2 units  - 201-225: + 3 units  - 226-250: + 4 units  - >250: + 5 units Please return in 1 month with your sugar log.  - he has an exemplary sugar log, checking sugars 8 or more times a day! - given more sugar logs - bring it at next appt  - given foot care handout and explained the principles  - given instructions for  hypoglycemia management "15-15 rule"  - advised for yearly eye exams - he is up to date - Return to clinic in 1 mo with sugar log - will check A1c then

## 2014-04-23 ENCOUNTER — Ambulatory Visit: Payer: Commercial Managed Care - HMO | Admitting: Family

## 2014-04-25 ENCOUNTER — Other Ambulatory Visit: Payer: Self-pay | Admitting: Family

## 2014-04-26 NOTE — Telephone Encounter (Signed)
Refill sent for amlodipine. Should still have refill remaining for lisinopril. Pt is due for follow up. Please call pt to arrange appt.

## 2014-05-17 ENCOUNTER — Ambulatory Visit: Payer: Commercial Managed Care - HMO | Admitting: Internal Medicine

## 2014-05-24 ENCOUNTER — Other Ambulatory Visit: Payer: Self-pay

## 2014-05-27 ENCOUNTER — Encounter: Payer: Self-pay | Admitting: Internal Medicine

## 2014-05-27 ENCOUNTER — Ambulatory Visit (INDEPENDENT_AMBULATORY_CARE_PROVIDER_SITE_OTHER): Payer: Commercial Managed Care - HMO | Admitting: Internal Medicine

## 2014-05-27 ENCOUNTER — Other Ambulatory Visit: Payer: Self-pay | Admitting: *Deleted

## 2014-05-27 VITALS — BP 122/72 | HR 64 | Temp 97.7°F | Resp 12 | Wt 180.0 lb

## 2014-05-27 DIAGNOSIS — Z23 Encounter for immunization: Secondary | ICD-10-CM

## 2014-05-27 DIAGNOSIS — E119 Type 2 diabetes mellitus without complications: Secondary | ICD-10-CM

## 2014-05-27 LAB — HEMOGLOBIN A1C: Hgb A1c MFr Bld: 7 % — ABNORMAL HIGH (ref 4.6–6.5)

## 2014-05-27 MED ORDER — INSULIN DETEMIR 100 UNIT/ML ~~LOC~~ SOLN: SUBCUTANEOUS | Status: DC

## 2014-05-27 NOTE — Patient Instructions (Signed)
-   Please decrease Levemir to 15 units in am and 27 units at bedtime. In 1 week, if the sugars are still around 70s, then decrease the dose to 10 units in am and keep the dose at night at 27. - Continue Humalog mealtime with an ICR 1:15 units 3x a day 15 min before a meal - Continue Humalog sliding scale as follows:  - 150-175: + 1 unit  - 176-200: + 2 units  - 201-225: + 3 units  - 226-250: + 4 units  - >250: + 5 units Please stop at the lab. Please come back for a follow-up appointment in 3 months with your sugar log.

## 2014-05-27 NOTE — Progress Notes (Signed)
Patient ID: Ryan [name], male   DOB: 31-Dec-1945, 68 y.o.   MRN: [id number]  HPI: Ryan [name] is a 68 y.o.-year-old male, returning for f/u for DM2 (?1), dx 1999, insulin-dependent since 2005, uncontrolled, without complications. Last visit 1.5 mo ago.  He was on the insulin pump after dx. He was taken off the pump ~2005 by the [place].   Last hemoglobin A1c was: Lab Results  Component Value Date   HGBA1C 7.3* 01/15/2014   HGBA1C 7.3* 10/09/2013   HGBA1C 7.0* 05/07/2013   Pt was on a regimen of: - Levemir 25 in am and 22 at bedtime - Humalog ICR 1:15 (5-10 units) units tid ac - Humalog SSI: target 150 - add 3 units (for am)  He is now on: - Levemir 20 units in am and 27 units at bedtime - Humalog mealtime with an ICR 1:15 units 3x a day 15 min before a meal - Humalog sliding scale as follows:  - 150-175: + 1 unit  - 176-200: + 2 units  - 201-225: + 3 units  - 226-250: + 4 units  - >250: + 5 units  Pt checks his sugars >8 a day and they are: - am: 126-200 >> 50, 80-140, 190 - 2h after b'fast: 77-150 >> 70, 74-140 - before lunch: 70-143 >> 70-130 - 2h after lunch: 43 x1, 70-129 >> 70-131 - before dinner: 94-150 >> 72-120 - 2h after dinner: 87-145 >> 75-140, 180 - bedtime: n/c - nighttime: n/c + pm lows. Lowest sugar was 49 at night after being busy all day. he has hypoglycemia awareness at 60.  Highest sugar was 180.  Pt's meals are: - Breakfast: egg beaters + toast - Lunch: cold cuts or salads - Dinner: meat + veggies - Snacks: fruit Walks 45 miles a week.  - no CKD, last BUN/creatinine:  Lab Results  Component Value Date   BUN 17 01/15/2014   CREATININE 0.93 01/15/2014  On Lisinopril. - last set of lipids: Lab Results  Component Value Date   CHOL 147 10/09/2013   HDL 37* 10/09/2013   LDLCALC 94 10/09/2013   TRIG 78 10/09/2013   CHOLHDL 4.0 10/09/2013  On Pravastatin. - last eye exam was in 09/14/2013. No DR.  - no numbness and tingling in his feet.  I reviewed pt's  medications, allergies, PMH, social hx, family hx and no changes required, except as mentioned above.  ROS: Constitutional: no weight gain/loss, + fatigue (does not sleep well - takes care of 2 disabled people: wife, son, grandson), no subjective hyperthermia/hypothermia, no excessive urination Eyes: no blurry vision, no xerophthalmia ENT: no sore throat, no nodules palpated in throat, no dysphagia/odynophagia, no hoarseness Cardiovascular: no CP/SOB/palpitations/leg swelling Respiratory: no cough/SOB Gastrointestinal: no N/V/D/C/heartburn Musculoskeletal: no muscle/joint aches Skin: no rashes Neurological: no tremors/numbness/tingling/dizziness  PE: BP 122/72  Pulse 64  Temp(Src) 97.7 F (36.5 C) (Oral)  Resp 12  Wt 180 lb (81.647 kg)  SpO2 97% Wt Readings from Last 3 Encounters:  05/27/14 180 lb (81.647 kg)  04/04/14 174 lb (78.926 kg)  01/15/14 174 lb (78.926 kg)   Constitutional: normal weight, in NAD Eyes: PERRLA, EOMI, no exophthalmos ENT: moist mucous membranes, no thyromegaly, no cervical lymphadenopathy Cardiovascular: RRR, No MRG Respiratory: CTA B Gastrointestinal: abdomen soft, NT, ND, BS+ Musculoskeletal: no deformities, strength intact in all 4 Skin: moist, warm, no rashes Neurological: no tremor with outstretched hands, DTR normal in all 4  ASSESSMENT: 1. DM2 or 1, insulin-dependent, uncontrolled, without complications  PLAN:  1. Patient with long-standing, now well controlled diabetes, on basal-bolus insulin regimen, with much improved control. - We discussed about options for treatment, and I suggested to:  - Please decrease Levemir to 15 units in am and 27 units at bedtime. In 1 week, if the sugars are still around 70s, then decrease the dose to 10 units in am and keep the dose at night at 27. - Continue Humalog mealtime with an ICR 1:15 units 3x a day 15 min before a meal - Continue Humalog sliding scale as follows:  - 150-175: + 1 unit  - 176-200: + 2  units  - 201-225: + 3 units  - 226-250: + 4 units  - >250: + 5 units Please stop at the lab. Please come back for a follow-up appointment in 3 months with your sugar log. - he has an exemplary sugar log, checking sugars 8 or more times a day! - advised for yearly eye exams - he is up to date - given flu vaccine today; he is UTD with PNA and zoster vaccine. - Return to clinic in 3 mo with sugar log   Office Visit on 05/27/2014  Component Date Value Ref Range Status  . Hemoglobin A1C 05/27/2014 7.0* 4.6 - 6.5 % Final   Glycemic Control Guidelines for People with Diabetes:Non Diabetic:  <6%Goal of Therapy: <7%Additional Action Suggested:  >8%    The HbA1c is great!

## 2014-06-19 ENCOUNTER — Other Ambulatory Visit: Payer: Self-pay | Admitting: Family

## 2014-06-20 NOTE — Telephone Encounter (Signed)
Metoprolol, amlodipine and pravastatin refills sent to pharmacy. Denial sent for humalog and levemir as pt is being treated by Dr Elvera Lennox for his diabetes.

## 2014-06-21 ENCOUNTER — Other Ambulatory Visit: Payer: Self-pay | Admitting: Family

## 2014-06-21 ENCOUNTER — Other Ambulatory Visit: Payer: Self-pay | Admitting: *Deleted

## 2014-06-21 MED ORDER — INSULIN DETEMIR 100 UNIT/ML ~~LOC~~ SOLN: SUBCUTANEOUS | Status: DC

## 2014-06-21 MED ORDER — INSULIN LISPRO 100 UNIT/ML ~~LOC~~ SOLN: 15.0000 [IU] | Freq: Three times a day (TID) | SUBCUTANEOUS | Status: DC

## 2014-06-21 NOTE — Telephone Encounter (Signed)
Informed patient of medication refill and he scheduled appointment for 11/17

## 2014-06-21 NOTE — Telephone Encounter (Signed)
Lisinopril refill sent to pharmacy. Pt last seen by PCP 01/2014 and is past due for follow up. Please call pt to arrange f/u in the next 30 days.

## 2014-06-25 ENCOUNTER — Ambulatory Visit (INDEPENDENT_AMBULATORY_CARE_PROVIDER_SITE_OTHER): Payer: Commercial Managed Care - HMO | Admitting: Family

## 2014-06-25 ENCOUNTER — Encounter: Payer: Self-pay | Admitting: Family

## 2014-06-25 VITALS — BP 110/80 | HR 49 | Temp 97.9°F | Resp 16 | Ht 69.0 in | Wt 179.0 lb

## 2014-06-25 DIAGNOSIS — I10 Essential (primary) hypertension: Secondary | ICD-10-CM

## 2014-06-25 DIAGNOSIS — E119 Type 2 diabetes mellitus without complications: Secondary | ICD-10-CM

## 2014-06-25 DIAGNOSIS — E785 Hyperlipidemia, unspecified: Secondary | ICD-10-CM

## 2014-06-25 LAB — BASIC METABOLIC PANEL
BUN: 13 mg/dL (ref 6–23)
CO2: 26 mEq/L (ref 19–32)
Calcium: 9.4 mg/dL (ref 8.4–10.5)
Chloride: 106 mEq/L (ref 96–112)
Creatinine, Ser: 1 mg/dL (ref 0.4–1.5)
GFR: 83.76 mL/min (ref >60.00)
Glucose, Bld: 82 mg/dL (ref 70–99)
Potassium: 4.1 mEq/L (ref 3.5–5.1)
Sodium: 137 mEq/L (ref 135–145)

## 2014-06-25 NOTE — Patient Instructions (Addendum)
Please complete your lab work prior to leaving. Follow up in 6 months.  

## 2014-06-25 NOTE — Progress Notes (Signed)
Subjective:    Patient ID: Ryan [name], male    DOB: [birth year], 68 y.o.   MRN: [id number]  HPI  Ryan [name] is a 68 yr old male who presents today for follow up.  1) DM2-  He is now established by Dr. Elvera Lennox. Reports sugars have been well controlled.   2) HTN- current BP meds include amlodipine, lisinopril, metoprolol.  BP Readings from Last 3 Encounters:  06/25/14 110/80  05/27/14 122/72  04/04/14 132/78   3) Hyperlipidemia-  Has been exercising.  Reports + compliance with pravastatin.  Denies myalgia.   Lab Results  Component Value Date   CHOL 147 10/09/2013   HDL 37* 10/09/2013   LDLCALC 94 10/09/2013   TRIG 78 10/09/2013   CHOLHDL 4.0 10/09/2013      Review of Systems See HPI Past Medical History  Diagnosis Date  . Diabetes mellitus   . History of chicken pox   . GERD (gastroesophageal reflux disease)   . Allergy   . Hyperlipidemia   . Hypertension     History   Social History  . Marital Status: Married    Spouse Name: N/A    Number of Children: 1  . Years of Education: N/A   Occupational History  . Not on file.   Social History Main Topics  . Smoking status: Former Smoker    Types: Cigarettes    Quit date: 09/01/1965  . Smokeless tobacco: Never Used  . Alcohol Use: Not on file  . Drug Use: No  . Sexual Activity: Not on file   Other Topics Concern  . Not on file   Social History Narrative   Regular exercise:  5 x weekly   Caffeine Use: 1 cups coffee daily.   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   Married- One son and grandson- both live with them   Wife has ovarian cancer             Past Surgical History  Procedure Laterality Date  . Shoulder surgery  1990    right shoulder, torn rotator cuff.  . Eye surgery  1998    bilateral    Family History  Problem Relation Age of Onset  . Cancer Mother     cancer  . Heart disease Mother   . Hyperlipidemia Mother   . Hypertension Mother   . Hyperlipidemia Father   . Stroke Father   .  Hypertension Father   . Diabetes Paternal Grandfather     No Known Allergies  Current Outpatient Prescriptions on File Prior to Visit  Medication Sig Dispense Refill  . ACCU-CHEK AVIVA PLUS test strip     . ACCU-CHEK SOFTCLIX LANCETS lancets     . amLODipine (NORVASC) 5 MG tablet TAKE 1 TABLET BY MOUTH DAILY. 30 tablet 3  . aspirin 81 MG tablet Take 81 mg by mouth daily.      . Calcium Carbonate-Vitamin D (CALTRATE 600+D) 600-400 MG-UNIT per tablet Take 1 tablet by mouth 2 (two) times daily.      . fluticasone (FLONASE) 50 MCG/ACT nasal spray Place 2 sprays into the nose daily.    . insulin detemir (LEVEMIR) 100 UNIT/ML injection INJECT 15 UNITS IN THE AM & 27 UNITS IN THE PM 20 mL 2  . insulin lispro (HUMALOG) 100 UNIT/ML injection Inject 0.15 mLs (15 Units total) into the skin 3 (three) times daily with meals. With a sliding scale. 20 mL 2  . Insulin Syringe-Needle U-100 30G X 3/8" 1 ML MISC  Use with insulin injections daily. 100 each 2  . lisinopril (PRINIVIL,ZESTRIL) 10 MG tablet TAKE 1 TABLET (10 MG) BY MOUTH DAILY. 90 tablet 0  . meclizine (ANTIVERT) 25 MG tablet Take 1 tablet (25 mg total) by mouth 3 (three) times daily as needed for dizziness. 30 tablet 0  . metoprolol succinate (TOPROL-XL) 25 MG 24 hr tablet TAKE 1 TABLET (25 MG TOTAL) BY MOUTH DAILY. 30 tablet 3  . pravastatin (PRAVACHOL) 80 MG tablet TAKE 1 TABLET BY MOUTH DAILY. 30 tablet 3   No current facility-administered medications on file prior to visit.    BP 110/80 mmHg  Pulse 49  Temp(Src) 97.9 F (36.6 C) (Oral)  Resp 16  Ht 5\' 9"  (1.753 m)  Wt 179 lb (81.194 kg)  BMI 26.42 kg/m2  SpO2 97%       Objective:   Physical Exam  Constitutional: He is oriented to person, place, and time. He appears well-developed and well-nourished. No distress.  HENT:  Head: Normocephalic and atraumatic.  Cardiovascular: Normal rate and regular rhythm.   No murmur heard. Pulmonary/Chest: Effort normal and breath sounds  normal. No respiratory distress. He has no wheezes. He has no rales. He exhibits no tenderness.  Musculoskeletal: He exhibits no edema.  Neurological: He is alert and oriented to person, place, and time.  Psychiatric: He has a normal mood and affect. His behavior is normal. Judgment and thought content normal.          Assessment & Plan:

## 2014-06-25 NOTE — Assessment & Plan Note (Signed)
BP stable on current medications, obtain follow up bmet.

## 2014-06-25 NOTE — Assessment & Plan Note (Signed)
LDL at goal, tolerating statin, continue same.  

## 2014-06-25 NOTE — Assessment & Plan Note (Signed)
Lab Results  Component Value Date   HGBA1C 7.0* 05/27/2014   Stable, defer management to endo.

## 2014-06-26 ENCOUNTER — Encounter: Payer: Self-pay | Admitting: Family

## 2014-08-26 ENCOUNTER — Ambulatory Visit: Payer: Commercial Managed Care - HMO | Admitting: Internal Medicine

## 2014-09-09 ENCOUNTER — Ambulatory Visit (INDEPENDENT_AMBULATORY_CARE_PROVIDER_SITE_OTHER): Payer: Commercial Managed Care - HMO | Admitting: Internal Medicine

## 2014-09-09 ENCOUNTER — Encounter: Payer: Self-pay | Admitting: Internal Medicine

## 2014-09-09 VITALS — BP 122/72 | HR 64 | Temp 97.3°F | Resp 12 | Wt 178.0 lb

## 2014-09-09 DIAGNOSIS — E119 Type 2 diabetes mellitus without complications: Secondary | ICD-10-CM

## 2014-09-09 LAB — HEMOGLOBIN A1C: Hgb A1c MFr Bld: 8.4 % — ABNORMAL HIGH (ref 4.6–6.5)

## 2014-09-09 MED ORDER — INSULIN DETEMIR 100 UNIT/ML ~~LOC~~ SOLN
SUBCUTANEOUS | Status: DC
Start: 2014-09-09 — End: 2014-11-12

## 2014-09-09 NOTE — Patient Instructions (Signed)
Please stop at the lab.  Please change Levemir to 37 units at bedtime.  Continue: - Humalog mealtime with an ICR 1:15 units 3x a day 15 min before a meal - Humalog sliding scale as follows:  - 150-175: + 1 unit  - 176-200: + 2 units  - 201-225: + 3 units  - 226-250: + 4 units  - >250: + 5 units  Please stop at the lab.  Please return in 3 months with your sugar log.

## 2014-09-09 NOTE — Progress Notes (Signed)
Patient ID: Ryan [name], male   DOB: Jul 29, 1946, 69 y.o.   MRN: [id number]  HPI: Ryan [name] is a 69 y.o.-year-old male, returning for f/u for DM2 (?1), dx 1999, insulin-dependent since 2005, uncontrolled, without complications. Last visit 3.5 mo ago.  He was on the insulin pump after dx. He was taken off the pump ~2005 by the [place].   Last hemoglobin A1c was: Lab Results  Component Value Date   HGBA1C 7.0* 05/27/2014   HGBA1C 7.3* 01/15/2014   HGBA1C 7.3* 10/09/2013   He is now on: - Levemir 20 >> 15 units in am and 27 units at bedtime - Humalog mealtime with an ICR 1:15 units 3x a day 15 min before a meal - Humalog sliding scale as follows:  - 150-175: + 1 unit  - 176-200: + 2 units  - 201-225: + 3 units  - 226-250: + 4 units  - >250: + 5 units  Pt checks his sugars >8 a day and they are: - am: 126-200 >> 50, 80-140, 190 >> 84-220 - 2h after b'fast: 77-150 >> 70, 74-140 >> 75, 80-136 - before lunch: 70-143 >> 70-130 >> 75, 80-140, 180 - 2h after lunch: 43 x1, 70-129 >> 70-131 >> 70-120 - before dinner: 94-150 >> 72-120 >> 101-150, 170 - 2h after dinner: 87-145 >> 75-140, 180 >> 82-164 - bedtime: n/c - nighttime: n/c + pm lows. Lowest sugar was 49 at night after being busy all day. he has hypoglycemia awareness at 60.  Highest sugar was 180.  Pt's meals are: - Breakfast: egg beaters + toast - Lunch: cold cuts or salads - Dinner: meat + veggies - Snacks: fruit Walks 45 miles a week.  - no CKD, last BUN/creatinine:  Lab Results  Component Value Date   BUN 13 06/25/2014   CREATININE 1.0 06/25/2014  On Lisinopril. - last set of lipids: Lab Results  Component Value Date   CHOL 147 10/09/2013   HDL 37* 10/09/2013   LDLCALC 94 10/09/2013   TRIG 78 10/09/2013   CHOLHDL 4.0 10/09/2013  On Pravastatin. - last eye exam was in 09/14/2013. No DR.  - no numbness and tingling in his feet.  I reviewed pt's medications, allergies, PMH, social hx, family hx, and changes  were documented in the history of present illness. Otherwise, unchanged from my initial visit note.  ROS: Constitutional: no weight gain/loss, + fatigue (does not sleep well), no subjective hyperthermia/hypothermia, + excessive urination Eyes: no blurry vision, no xerophthalmia ENT: no sore throat, no nodules palpated in throat, no dysphagia/odynophagia, no hoarseness Cardiovascular: no CP/SOB/palpitations/leg swelling Respiratory: no cough/SOB Gastrointestinal: no N/V/D/C/heartburn Musculoskeletal: no muscle/+ joint aches Skin: no rashes, + hair loss Neurological: no tremors/numbness/tingling/dizziness + low libido  PE: BP 122/72 mmHg  Pulse 64  Temp(Src) 97.3 F (36.3 C) (Oral)  Resp 12  Wt 178 lb (80.74 kg)  SpO2 97% Body mass index is 26.27 kg/(m^2).   Wt Readings from Last 3 Encounters:  09/09/14 178 lb (80.74 kg)  06/25/14 179 lb (81.194 kg)  05/27/14 180 lb (81.647 kg)   Constitutional: normal weight, in NAD Eyes: PERRLA, EOMI, no exophthalmos ENT: moist mucous membranes, no thyromegaly, no cervical lymphadenopathy Cardiovascular: RRR, No MRG Respiratory: CTA B Gastrointestinal: abdomen soft, NT, ND, BS+ Musculoskeletal: no deformities, strength intact in all 4 Skin: moist, warm, no rashes Neurological: no tremor with outstretched hands, DTR normal in all 4  ASSESSMENT: 1. DM2 or 1, insulin-dependent, uncontrolled, without complications  PLAN:  1.  Patient with long-standing, now fairly controlled diabetes, on basal-bolus insulin regimen, with higher sugars in am lately. He continues to have sugars in the 70s later in the days. Will decrease Levemir dose and move it all at bedtime. I advised him to check more sugars after this change and let me know if he has lows in am. - We discussed about options for treatment, and I suggested to:  Patient Instructions  Please stop at the lab.  Please change Levemir to 37 units at bedtime.  Continue: - Humalog mealtime with  an ICR 1:15 units 3x a day 15 min before a meal - Humalog sliding scale as follows:  - 150-175: + 1 unit  - 176-200: + 2 units  - 201-225: + 3 units  - 226-250: + 4 units  - >250: + 5 units  Please stop at the lab.  Please return in 3 months with your sugar log.   - he has an exemplary sugar log, checking sugars 8 or more times a day! - advised for yearly eye exams - he is up to date - given flu vaccine at last visit; he is UTD with PNA and zoster vaccine. - check HbA1c today - Return to clinic in 3 mo with sugar log   Office Visit on 09/09/2014  Component Date Value Ref Range Status  . Hgb A1c MFr Bld 09/09/2014 8.4* 4.6 - 6.5 % Final   Glycemic Control Guidelines for People with Diabetes:Non Diabetic:  <6%Goal of Therapy: <7%Additional Action Suggested:  >8%    HbA1c higher. I hope that normalizing the sugars in the morning will help. Please see plan above.

## 2014-09-17 ENCOUNTER — Other Ambulatory Visit: Payer: Self-pay | Admitting: Family

## 2014-09-17 ENCOUNTER — Telehealth: Payer: Self-pay | Admitting: Internal Medicine

## 2014-09-17 ENCOUNTER — Other Ambulatory Visit: Payer: Self-pay | Admitting: *Deleted

## 2014-09-17 MED ORDER — INSULIN LISPRO 100 UNIT/ML ~~LOC~~ SOLN: 15.0000 [IU] | Freq: Three times a day (TID) | SUBCUTANEOUS | Status: DC

## 2014-09-17 NOTE — Telephone Encounter (Signed)
Rx sent to pharmacy   

## 2014-09-17 NOTE — Telephone Encounter (Signed)
Patient need refill of Humalog. °

## 2014-09-17 NOTE — Telephone Encounter (Signed)
OK to refill

## 2014-10-15 ENCOUNTER — Other Ambulatory Visit: Payer: Self-pay | Admitting: Family

## 2014-11-12 ENCOUNTER — Other Ambulatory Visit: Payer: Self-pay | Admitting: Internal Medicine

## 2014-11-12 ENCOUNTER — Other Ambulatory Visit: Payer: Self-pay | Admitting: Family

## 2014-11-12 NOTE — Telephone Encounter (Signed)
Medication Detail      Disp Refills Start End     metoprolol succinate (TOPROL-XL) 25 MG 24 hr tablet 30 tablet 3 06/20/2014     Sig: TAKE 1 TABLET (25 MG TOTAL) BY MOUTH DAILY.    E-Prescribing Status: Receipt confirmed by pharmacy (06/20/2014 5:01 PM EST)      Medication Detail      Disp Refills Start End     pravastatin (PRAVACHOL) 80 MG tablet 30 tablet 3 06/20/2014     Sig: TAKE 1 TABLET BY MOUTH DAILY.    E-Prescribing Status: Receipt confirmed by pharmacy (06/20/2014 5:01 PM EST)      ROV: due May 2016 Refill sent per [facility] refill protocol/SLS

## 2014-12-09 ENCOUNTER — Ambulatory Visit: Payer: Commercial Managed Care - HMO | Admitting: Internal Medicine

## 2014-12-12 ENCOUNTER — Other Ambulatory Visit: Payer: Self-pay | Admitting: Internal Medicine

## 2014-12-30 ENCOUNTER — Other Ambulatory Visit: Payer: Self-pay | Admitting: *Deleted

## 2014-12-30 DIAGNOSIS — E119 Type 2 diabetes mellitus without complications: Secondary | ICD-10-CM

## 2015-01-07 ENCOUNTER — Other Ambulatory Visit (INDEPENDENT_AMBULATORY_CARE_PROVIDER_SITE_OTHER): Payer: Commercial Managed Care - HMO

## 2015-01-07 ENCOUNTER — Telehealth: Payer: Self-pay | Admitting: *Deleted

## 2015-01-07 DIAGNOSIS — E119 Type 2 diabetes mellitus without complications: Secondary | ICD-10-CM | POA: Diagnosis not present

## 2015-01-07 LAB — HEMOGLOBIN A1C: Hgb A1c MFr Bld: 7.1 % — ABNORMAL HIGH (ref 4.6–6.5)

## 2015-01-07 MED ORDER — INSULIN LISPRO 100 UNIT/ML ~~LOC~~ SOLN: SUBCUTANEOUS | Status: DC

## 2015-01-07 NOTE — Telephone Encounter (Signed)
Pt walked in to the office requesting sample of Humalog u-100 vial.  1 vial given to pt. He has f/u with PCP on 01/15/15.  Pt also states that his wife recently passed away.

## 2015-01-15 ENCOUNTER — Telehealth: Payer: Self-pay | Admitting: Family

## 2015-01-15 ENCOUNTER — Ambulatory Visit: Payer: Commercial Managed Care - HMO | Admitting: Family

## 2015-01-15 NOTE — Telephone Encounter (Signed)
Pre Visit letter sent  °

## 2015-02-03 ENCOUNTER — Telehealth: Payer: Self-pay | Admitting: Behavioral Health

## 2015-02-03 ENCOUNTER — Encounter: Payer: Self-pay | Admitting: Behavioral Health

## 2015-02-03 ENCOUNTER — Other Ambulatory Visit: Payer: Self-pay

## 2015-02-03 NOTE — Telephone Encounter (Signed)
Pre-Visit Call completed with patient and chart updated.   Pre-Visit Info documented in Specialty Comments under [employer].    

## 2015-02-04 ENCOUNTER — Ambulatory Visit (INDEPENDENT_AMBULATORY_CARE_PROVIDER_SITE_OTHER): Payer: Commercial Managed Care - HMO | Admitting: Family

## 2015-02-04 ENCOUNTER — Encounter: Payer: Self-pay | Admitting: Family

## 2015-02-04 VITALS — BP 128/84 | HR 51 | Temp 97.9°F | Ht 69.5 in | Wt 180.1 lb

## 2015-02-04 DIAGNOSIS — E1310 Other specified diabetes mellitus with ketoacidosis without coma: Secondary | ICD-10-CM | POA: Diagnosis not present

## 2015-02-04 DIAGNOSIS — Z0000 Encounter for general adult medical examination without abnormal findings: Secondary | ICD-10-CM | POA: Diagnosis not present

## 2015-02-04 DIAGNOSIS — M8580 Other specified disorders of bone density and structure, unspecified site: Secondary | ICD-10-CM

## 2015-02-04 DIAGNOSIS — E785 Hyperlipidemia, unspecified: Secondary | ICD-10-CM

## 2015-02-04 DIAGNOSIS — E1110 Type 2 diabetes mellitus with ketoacidosis without coma: Secondary | ICD-10-CM

## 2015-02-04 DIAGNOSIS — Z125 Encounter for screening for malignant neoplasm of prostate: Secondary | ICD-10-CM

## 2015-02-04 LAB — BASIC METABOLIC PANEL
BUN: 9 mg/dL (ref 6–23)
CO2: 26 mEq/L (ref 19–32)
Calcium: 9.6 mg/dL (ref 8.4–10.5)
Chloride: 102 mEq/L (ref 96–112)
Creatinine, Ser: 0.94 mg/dL (ref 0.40–1.50)
GFR: 84.64 mL/min (ref >60.00)
Glucose, Bld: 169 mg/dL — ABNORMAL HIGH (ref 70–99)
Potassium: 4.3 mEq/L (ref 3.5–5.1)
Sodium: 137 mEq/L (ref 135–145)

## 2015-02-04 LAB — LIPID PANEL
Cholesterol: 162 mg/dL (ref 0–200)
HDL: 42.9 mg/dL (ref >39.00)
LDL Cholesterol: 104 mg/dL — ABNORMAL HIGH (ref 0–99)
NonHDL: 119.1
Total CHOL/HDL Ratio: 4
Triglycerides: 77 mg/dL (ref 0.0–149.0)
VLDL: 15.4 mg/dL (ref 0.0–40.0)

## 2015-02-04 LAB — MICROALBUMIN / CREATININE URINE RATIO
Creatinine,U: 116.1 mg/dL
Microalb Creat Ratio: 0.6 mg/g (ref 0.0–30.0)
Microalb, Ur: <0.7 mg/dL (ref 0.0–1.9)

## 2015-02-04 LAB — PSA, MEDICARE: PSA: 0.1 ng/ml (ref 0.10–4.00)

## 2015-02-04 NOTE — Patient Instructions (Addendum)
Please complete lab work prior to leaving.  Follow up in 6 months.  

## 2015-02-04 NOTE — Progress Notes (Signed)
Subjective:    Ryan [name] is a 69 y.o. male who presents for Medicare Annual/Subsequent preventive examination.   Preventive Screening-Counseling & Management  Tobacco History  Smoking status  . Former Smoker  . Types: Cigarettes  . Quit date: 09/01/1965  Smokeless tobacco  . Never Used    Problems Prior to Visit 1. HTN-  Patient is currently maintained on the following medications for blood pressure: amlodipine Patient reports good compliance with blood pressure medications. Patient denies chest pain, shortness of breath or swelling. Last 3 blood pressure readings in our office are as follows:   BP Readings from Last 3 Encounters:  02/04/15 128/84  09/09/14 122/72  06/25/14 110/80     2.  DM2-  Pt is currently maintained on the following medications for diabetes:levemir, humalog Last A1C was:  Lab Results  Component Value Date   HGBA1C 7.1* 01/07/2015  Last diabetic eye exam was:  2/15, scheduled in July Denies polyuria/polydipsia. Reports rare hypoglycemia She is scheduled to see Endo in July.   3.  GERD-  Stable off of meds.    4.  Hyperlipidemia-  Pravastatin- denies myalgia.  Lab Results  Component Value Date   CHOL 147 10/09/2013   HDL 37* 10/09/2013   LDLCALC 94 10/09/2013   TRIG 78 10/09/2013   CHOLHDL 4.0 10/09/2013   5.  Preventative:   Allergies verified: NKA  Immunization Status: up to date Flu vaccine-- 05/27/14 Tdap-- 08/25/11 PNA-- 05/11/13 Shingles-- 08/25/11 Colo- declines. He is agreeable to cologuard.  Current Problems (verified) Patient Active Problem List   Diagnosis Date Noted  . Benign paroxysmal positional vertigo 01/15/2014  . Skin lesion of left arm 05/11/2013  . Osteopenia 06/09/2011  . Hyperbilirubinemia 05/26/2011  . DM2 (diabetes mellitus, type 2) 02/22/2011  . HTN (hypertension) 02/22/2011  . Hyperlipidemia 02/22/2011  . GERD (gastroesophageal reflux disease) 02/22/2011  . Allergic rhinitis 02/22/2011     Medications Prior to Visit Current Outpatient Prescriptions on File Prior to Visit  Medication Sig Dispense Refill  . ACCU-CHEK AVIVA PLUS test strip     . ACCU-CHEK SOFTCLIX LANCETS lancets     . amLODipine (NORVASC) 5 MG tablet TAKE 1 TABLET BY MOUTH DAILY. 30 tablet 3  . aspirin 81 MG tablet Take 81 mg by mouth daily.      . Calcium Carbonate-Vitamin D (CALTRATE 600+D) 600-400 MG-UNIT per tablet Take 1 tablet by mouth 2 (two) times daily.      . insulin detemir (LEVEMIR) 100 UNIT/ML injection Inject 0.37 mLs (37 Units total) into the skin at bedtime. 20 mL 2  . insulin lispro (HUMALOG) 100 UNIT/ML injection INJECT 0.15 MLS (15 UNITS TOTAL) INTO THE SKIN 3 (THREE) TIMES DAILY WITH MEALS. WITH A SLIDING SCALE. 10 mL 0  . Insulin Syringe-Needle U-100 30G X 3/8" 1 ML MISC Use with insulin injections daily. 100 each 2  . lisinopril (PRINIVIL,ZESTRIL) 10 MG tablet TAKE 1 TABLET (10 MG) BY MOUTH DAILY. 90 tablet 1  . metoprolol succinate (TOPROL-XL) 25 MG 24 hr tablet TAKE 1 TABLET (25 MG TOTAL) BY MOUTH DAILY. 30 tablet 3  . pravastatin (PRAVACHOL) 80 MG tablet TAKE 1 TABLET BY MOUTH DAILY. 30 tablet 3   No current facility-administered medications on file prior to visit.    Current Medications (verified) Current Outpatient Prescriptions  Medication Sig Dispense Refill  . ACCU-CHEK AVIVA PLUS test strip     . ACCU-CHEK SOFTCLIX LANCETS lancets     . amLODipine (NORVASC) 5 MG tablet TAKE  1 TABLET BY MOUTH DAILY. 30 tablet 3  . aspirin 81 MG tablet Take 81 mg by mouth daily.      . Calcium Carbonate-Vitamin D (CALTRATE 600+D) 600-400 MG-UNIT per tablet Take 1 tablet by mouth 2 (two) times daily.      . insulin detemir (LEVEMIR) 100 UNIT/ML injection Inject 0.37 mLs (37 Units total) into the skin at bedtime. 20 mL 2  . insulin lispro (HUMALOG) 100 UNIT/ML injection INJECT 0.15 MLS (15 UNITS TOTAL) INTO THE SKIN 3 (THREE) TIMES DAILY WITH MEALS. WITH A SLIDING SCALE. 10 mL 0  . Insulin  Syringe-Needle U-100 30G X 3/8" 1 ML MISC Use with insulin injections daily. 100 each 2  . lisinopril (PRINIVIL,ZESTRIL) 10 MG tablet TAKE 1 TABLET (10 MG) BY MOUTH DAILY. 90 tablet 1  . metoprolol succinate (TOPROL-XL) 25 MG 24 hr tablet TAKE 1 TABLET (25 MG TOTAL) BY MOUTH DAILY. 30 tablet 3  . pravastatin (PRAVACHOL) 80 MG tablet TAKE 1 TABLET BY MOUTH DAILY. 30 tablet 3   No current facility-administered medications for this visit.     Allergies (verified) Review of patient's allergies indicates no known allergies.   PAST HISTORY  Family History Family History  Problem Relation Age of Onset  . Cancer Mother     cancer  . Heart disease Mother   . Hyperlipidemia Mother   . Hypertension Mother   . Hyperlipidemia Father   . Stroke Father   . Hypertension Father   . Diabetes Paternal Grandfather     Social History History  Substance Use Topics  . Smoking status: Former Smoker    Types: Cigarettes    Quit date: 09/01/1965  . Smokeless tobacco: Never Used  . Alcohol Use: Not on file    Are there smokers in your home (other than you)?  No  Risk Factors Current exercise habits: walks regularly  Dietary issues discussed: healthy diet- diabetic diet   Cardiac risk factors: advanced age (older than 92 for men, 34 for women), diabetes mellitus, dyslipidemia, hypertension and male gender.  Depression Screen (Note: if answer to either of the following is "Yes", a more complete depression screening is indicated)   Q1: Over the past two weeks, have you felt down, depressed or hopeless? No  Q2: Over the past two weeks, have you felt little interest or pleasure in doing things? No  Have you lost interest or pleasure in daily life? No  Do you often feel hopeless? No  Do you cry easily over simple problems? No  Activities of Daily Living In your present state of health, do you have any difficulty performing the following activities?:  Driving? No Managing money?  No Feeding  yourself? No Getting from bed to chair? No Climbing a flight of stairs? No Preparing food and eating?: No Bathing or showering? No Getting dressed: No Getting to the toilet? No Using the toilet:No Moving around from place to place: No In the past year have you fallen or had a near fall?:No   Are you sexually active?  No  Do you have more than one partner?  No  Hearing Difficulties: No Do you often ask people to speak up or repeat themselves? No Do you experience ringing or noises in your ears? No Do you have difficulty understanding soft or whispered voices? No   Do you feel that you have a problem with memory? No  Do you often misplace items? No  Do you feel safe at home?  Yes  Cognitive  Testing  Alert? Yes  Normal Appearance?Yes  Oriented to person? Yes  Place? Yes   Time? Yes  Recall of three objects?  Yes  Can perform simple calculations? Yes  Displays appropriate judgment?Yes  Can read the correct time from a watch face?Yes   Advanced Directives have been discussed with the patient? Yes   List the Names of Other Physician/Practitioners you currently use: 1.    Indicate any recent Medical Services you may have received from other than [facility] providers in the past year (date may be approximate).  Immunization History  Administered Date(s) Administered  . H1N1 11/19/2008  . Influenza Split 05/25/2011, 04/20/2012  . Influenza Whole 06/06/2009  . Influenza,inj,Quad PF,36+ Mos 05/07/2013, 05/27/2014  . Pneumococcal Conjugate-13 10/19/2004, 05/11/2013  . Pneumococcal Polysaccharide-23 05/25/2011  . Td 08/09/2001, 08/25/2011  . Zoster 08/25/2011    Screening Tests Health Maintenance  Topic Date Due  . COLONOSCOPY  04/13/2010  . FOOT EXAM  09/19/2013  . OPHTHALMOLOGY EXAM  09/14/2014  . URINE MICROALBUMIN  10/10/2014  . INFLUENZA VACCINE  03/10/2015  . HEMOGLOBIN A1C  07/09/2015  . TETANUS/TDAP  08/24/2021  . ZOSTAVAX  Completed  . PNA vac Low Risk Adult   Completed    All answers were reviewed with the patient and necessary referrals were made:  [name],Robertha Staples S., NP   02/04/2015   History reviewed: allergies, current medications, past family history, past medical history, past social history, past surgical history and problem list  Review of Systems . Review of Systems  Constitutional: Negative for weight loss.  HENT: Negative for hearing loss.   Eyes: Negative for blurred vision.  Respiratory: Negative for shortness of breath.   Cardiovascular: Negative for chest pain and leg swelling.  Gastrointestinal: Negative for nausea and vomiting.  Genitourinary: Negative for dysuria and frequency.  Musculoskeletal: Negative for myalgias, joint pain and neck pain.  Skin: Negative for rash.  Neurological:       Denies HA  Psychiatric/Behavioral: Negative for depression.      Objective:      Blood pressure 128/84, pulse 51, temperature 97.9 F (36.6 C), temperature source Oral, height 5' 9.5" (1.765 m), weight 180 lb 2 oz (81.704 kg), SpO2 97 %. Body mass index is 26.23 kg/(m^2).    Physical Exam  Constitutional: He is oriented to person, place, and time. He appears well-developed and well-nourished. No distress.  HENT:  Head: Normocephalic and atraumatic.  Right Ear: Tympanic membrane and ear canal normal.  Left Ear: Tympanic membrane and ear canal normal.  Mouth/Throat: Oropharynx is clear and moist.  Eyes: Pupils are equal, round, and reactive to light. No scleral icterus. right lateral nystagmus Neck: Normal range of motion. No thyromegaly present.  Cardiovascular: Normal rate and regular rhythm.   No murmur heard. Pulmonary/Chest: Effort normal and breath sounds normal. No respiratory distress. He has no wheezes. He has no rales. He exhibits no tenderness.  Abdominal: Soft. Bowel sounds are normal. He exhibits no distension and no mass. There is no tenderness. There is no rebound and no guarding.  Musculoskeletal: He  exhibits no edema.  Lymphadenopathy:    He has no cervical adenopathy.  Neurological: He is alert and oriented to person, place, and time. He has normal reflexes. He exhibits normal muscle tone. Coordination normal.  Skin: Skin is warm and dry.  Psychiatric: He has a normal mood and affect. His behavior is normal. Judgment and thought content normal.          Assessment & Plan:  Assessment:        Plan:     During the course of the visit the patient was educated and counseled about appropriate screening and preventive services including:    Prostate cancer screening  Advanced directives: forms will be provided.   Diet review for nutrition referral? Yes ____  Not Indicated _x___   Patient Instructions (the written plan) was given to the patient.  Medicare Attestation I have personally reviewed: The patient's medical and social history Their use of alcohol, tobacco or illicit drugs Their current medications and supplements The patient's functional ability including ADLs,fall risks, home safety risks, cognitive, and hearing and visual impairment Diet and physical activities Evidence for depression or mood disorders  The patient's weight, height, BMI, and visual acuity have been recorded in the chart.  I have made referrals, counseling, and provided education to the patient based on review of the above and I have provided the patient with a written personalized care plan for preventive services.     [name],Ariell Gunnels S., NP   02/04/2015

## 2015-02-04 NOTE — Progress Notes (Signed)
Pre visit review using our clinic review tool, if applicable. No additional management support is needed unless otherwise documented below in the visit note. 

## 2015-02-05 ENCOUNTER — Telehealth: Payer: Self-pay | Admitting: Family

## 2015-02-05 DIAGNOSIS — Z0000 Encounter for general adult medical examination without abnormal findings: Secondary | ICD-10-CM | POA: Insufficient documentation

## 2015-02-05 NOTE — Telephone Encounter (Signed)
Could you please mail patient advanced directive paperwork and initiate cologuard?  thanks

## 2015-02-05 NOTE — Assessment & Plan Note (Signed)
Immunizations reviewed and up to date.  Continue healthy diet, exercise.  Obtain routine labs.  Obtain bone density, PSA, cologuard.

## 2015-02-06 NOTE — Telephone Encounter (Signed)
Order Via the cologard portal.     KP

## 2015-02-06 NOTE — Telephone Encounter (Signed)
Please see below.

## 2015-02-07 NOTE — Telephone Encounter (Signed)
Advanced directive paperwork mailed and patient notified.

## 2015-02-11 ENCOUNTER — Other Ambulatory Visit: Payer: Self-pay | Admitting: Internal Medicine

## 2015-02-11 ENCOUNTER — Encounter: Payer: Self-pay | Admitting: Family

## 2015-02-11 ENCOUNTER — Other Ambulatory Visit: Payer: Self-pay | Admitting: Family

## 2015-02-24 LAB — HM DIABETES EYE EXAM

## 2015-02-27 LAB — COLOGUARD: Cologuard: NEGATIVE

## 2015-02-28 ENCOUNTER — Ambulatory Visit: Payer: Commercial Managed Care - HMO | Admitting: Internal Medicine

## 2015-03-07 LAB — COLOGUARD

## 2015-03-10 ENCOUNTER — Other Ambulatory Visit: Payer: Self-pay | Admitting: Family

## 2015-03-20 ENCOUNTER — Encounter: Payer: Self-pay | Admitting: Family

## 2015-03-20 ENCOUNTER — Telehealth: Payer: Self-pay | Admitting: Family

## 2015-03-20 NOTE — Telephone Encounter (Signed)
Pt called to check on cologard screening and I informed him it was just scanned this morning. Please call once reviewed.

## 2015-03-24 NOTE — Telephone Encounter (Signed)
Notified pt and he states he received copy of result over the weekend that I mailed to him.

## 2015-03-25 ENCOUNTER — Telehealth: Payer: Self-pay | Admitting: Internal Medicine

## 2015-03-25 MED ORDER — "INSULIN SYRINGE-NEEDLE U-100 30G X 3/8"" 1 ML MISC": Status: DC

## 2015-03-25 NOTE — Telephone Encounter (Signed)
Patient called and would like a refill on his Rx  Rx: needles and syringes   Pharmacy: [facility]    Thank you

## 2015-03-25 NOTE — Telephone Encounter (Signed)
Patient would like a refill on his Rx  Rx: Syringes

## 2015-03-31 ENCOUNTER — Encounter: Payer: Self-pay | Admitting: Family

## 2015-04-01 ENCOUNTER — Encounter: Payer: Self-pay | Admitting: Internal Medicine

## 2015-04-01 ENCOUNTER — Ambulatory Visit (INDEPENDENT_AMBULATORY_CARE_PROVIDER_SITE_OTHER): Payer: Commercial Managed Care - HMO | Admitting: Internal Medicine

## 2015-04-01 VITALS — BP 124/68 | HR 66 | Temp 97.8°F | Resp 12 | Wt 180.0 lb

## 2015-04-01 DIAGNOSIS — E1165 Type 2 diabetes mellitus with hyperglycemia: Secondary | ICD-10-CM

## 2015-04-01 NOTE — Progress Notes (Signed)
Patient ID: Ryan [name], male   DOB: [birth year], 69 y.o.   MRN: [id number]  HPI: Ryan [name] is a 69 y.o.-year-old male, returning for f/u for DM2 (?1), dx 1999, insulin-dependent since 2005, uncontrolled, without complications. Last visit 3.5 mo ago.  Since last visit, he had 3 deaths in the family within 4 months: His mother, his father, and his wife (ovarian cancer). He is taking care of his 69 year old grandson.  He was on the insulin pump after dx. He was taken off the pump ~2005 by the [place] as this was not covered anymore.   Last hemoglobin A1c was: Lab Results  Component Value Date   HGBA1C 7.1* 01/07/2015   HGBA1C 8.4* 09/09/2014   HGBA1C 7.0* 05/27/2014   He is now on: - Levemir 37 units at bedtime - Humalog mealtime with an ICR 1:15 units 3x a day 15 min before a meal - Humalog sliding scale as follows:  - 150-175: + 1 unit  - 176-200: + 2 units  - 201-225: + 3 units  - 226-250: + 4 units  - >250: + 5 units  Pt checks his sugars 6-8 a day and they are: - am: 126-200 >> 50, 80-140, 190 >> 84-220 >> 75-143, 160 (stopped eating after 8 am) - 2h after b'fast: 77-150 >> 70, 74-140 >> 75, 80-136 >> 72-154 - before lunch: 70-143 >> 70-130 >> 75, 80-140, 180 >> 86-120 - 2h after lunch: 43 x1, 70-129 >> 70-131 >> 70-120 >> 78-130 - before dinner: 94-150 >> 72-120 >> 101-150, 170 >> 78-148 - 2h after dinner: 87-145 >> 75-140, 180 >> 82-164 >> 78-140, 158 - bedtime: n/c >> 78-179 - nighttime: n/c + pm lows. Lowest sugar was 49 >> 78. he has hypoglycemia awareness at 60.  Highest sugar was 180 >> 177  Pt's meals are: - Breakfast: egg beaters + toast - Lunch: cold cuts or salads - Dinner: meat + veggies - Snacks: fruit Walks 45 miles a week.  - no CKD, last BUN/creatinine:  Lab Results  Component Value Date   BUN 9 02/04/2015   CREATININE 0.94 02/04/2015  On Lisinopril. - last set of lipids: Lab Results  Component Value Date   CHOL 162 02/04/2015   HDL 42.90  02/04/2015   LDLCALC 104* 02/04/2015   TRIG 77.0 02/04/2015   CHOLHDL 4 02/04/2015  On Pravastatin. - last eye exam was in:  Component Date Value  . HM Diabetic Eye Exam 02/24/2015 No Retinopathy    - no numbness and tingling in his feet. Foot exam normal in 03/2015.  I reviewed pt's medications, allergies, PMH, social hx, family hx, and changes were documented in the history of present illness. Otherwise, unchanged from my initial visit note.  ROS: Constitutional: no weight gain/loss, no subjective hyperthermia/hypothermia, + excessive urination Eyes: no blurry vision, no xerophthalmia ENT: no sore throat, no nodules palpated in throat, no dysphagia/odynophagia, no hoarseness Cardiovascular: no CP/SOB/palpitations/leg swelling Respiratory: no cough/SOB Gastrointestinal: no N/V/D/C/heartburn Musculoskeletal: no muscle/+ joint aches Skin: no rashes Neurological: no tremors/numbness/tingling/dizziness  PE: BP 124/68 mmHg  Pulse 66  Temp(Src) 97.8 F (36.6 C) (Oral)  Resp 12  Wt 180 lb (81.647 kg)  SpO2 97% Body mass index is 26.21 kg/(m^2).   Wt Readings from Last 3 Encounters:  04/01/15 180 lb (81.647 kg)  02/04/15 180 lb 2 oz (81.704 kg)  09/09/14 178 lb (80.74 kg)   Constitutional: normal weight, in NAD Eyes: PERRLA, EOMI, no exophthalmos ENT: moist mucous membranes, no  thyromegaly, no cervical lymphadenopathy Cardiovascular: RRR, No MRG Respiratory: CTA B Gastrointestinal: abdomen soft, NT, ND, BS+ Musculoskeletal: no deformities, strength intact in all 4 Skin: moist, warm, no rashes Neurological: no tremor with outstretched hands, DTR normal in all 4  ASSESSMENT: 1. DM2 or 1, insulin-dependent, uncontrolled, without complications  PLAN:  1. Patient with long-standing, now fairly controlled diabetes, on basal-bolus insulin regimen, with improved am sugars lately. He continues to have sugars in the 70s later in the day, but overall sugars are well controlled.  Will continue the same regimen. We discussed about maybe changing to regular insulin and NPH since he is having problems affording the insulin while in the doughnut hole. He would want to change, but he will let me know whether we absolutely need to do this. - I suggested to:  Patient Instructions  Please continue: - Levemir to 37 units at bedtime. - Humalog mealtime with an ICR 1:15 units 3x a day 15 min before a meal - Humalog sliding scale as follows:  - 150-175: + 1 unit  - 176-200: + 2 units  - 201-225: + 3 units  - 226-250: + 4 units  - >250: + 5 units  Please return in 3 months with your sugar log (after 07/11/2015).  Please schedule a lab appt for HbA1c on 04/10/2015.  - he has an exemplary sugar log, despite the dramatic events that happened in his life lately - advised for yearly eye exams - he is up to date - check HbA1c in 8 days - Return to clinic in 3 mo with sugar log

## 2015-04-01 NOTE — Patient Instructions (Signed)
Please continue: - Levemir to 37 units at bedtime. - Humalog mealtime with an ICR 1:15 units 3x a day 15 min before a meal - Humalog sliding scale as follows:  - 150-175: + 1 unit  - 176-200: + 2 units  - 201-225: + 3 units  - 226-250: + 4 units  - >250: + 5 units  Please return in 3 months with your sugar log (after 04/11/2015).  Please schedule a lab appt for HbA1c on 04/10/2015.

## 2015-04-11 ENCOUNTER — Other Ambulatory Visit: Payer: Self-pay | Admitting: *Deleted

## 2015-04-11 ENCOUNTER — Other Ambulatory Visit (INDEPENDENT_AMBULATORY_CARE_PROVIDER_SITE_OTHER): Payer: Commercial Managed Care - HMO

## 2015-04-11 DIAGNOSIS — E119 Type 2 diabetes mellitus without complications: Secondary | ICD-10-CM

## 2015-04-11 LAB — HEMOGLOBIN A1C: Hgb A1c MFr Bld: 7.1 % — ABNORMAL HIGH (ref 4.6–6.5)

## 2015-04-17 ENCOUNTER — Other Ambulatory Visit: Payer: Self-pay | Admitting: Internal Medicine

## 2015-04-17 ENCOUNTER — Other Ambulatory Visit: Payer: Self-pay | Admitting: Family

## 2015-04-17 NOTE — Telephone Encounter (Signed)
Last filled: 09/18/14 Amt: 90, 1 Last OV:  02/04/15 Next appt: 07/23/15  90 supply sent to pharmacy.

## 2015-05-13 ENCOUNTER — Ambulatory Visit (INDEPENDENT_AMBULATORY_CARE_PROVIDER_SITE_OTHER): Payer: Commercial Managed Care - HMO

## 2015-05-13 DIAGNOSIS — Z23 Encounter for immunization: Secondary | ICD-10-CM

## 2015-06-12 ENCOUNTER — Other Ambulatory Visit: Payer: Self-pay | Admitting: Internal Medicine

## 2015-06-12 ENCOUNTER — Encounter: Payer: Self-pay | Admitting: Internal Medicine

## 2015-06-12 ENCOUNTER — Telehealth: Payer: Self-pay | Admitting: *Deleted

## 2015-06-12 NOTE — Telephone Encounter (Signed)
Would you be willing to do the letter so Medicare will know that you made the adjustment?

## 2015-06-12 NOTE — Telephone Encounter (Signed)
done

## 2015-06-12 NOTE — Telephone Encounter (Signed)
Romeo Apple, from the [facility] called stating that they are being audited by Medicare. The reason they contacted [place] concerning this pt is, pt is using approx 70+ units a month and is getting a refill of his Humalog every 28 days. Medicare is disputing this b/c the rx is written for approx 60+ units (depending on his needs and using the sliding scale). Pt has not contacted [place] to advise [place] that he is needing more insulin due to the increases in his blood sugar. Medicare does not want to cover the cost of the pt's insulin needs due to the rx from Dr Elvera Lennox being different. They would like to know if Dr Elvera Lennox would be willing to do a letter stating pt is taking the 70+ units monthly, so Medicare will be willing to cover the cost of the insulin for the pt? Please advise.

## 2015-06-12 NOTE — Telephone Encounter (Signed)
Yes, no pb [name], please re-send the Rx for 70+ units of insulin a day.

## 2015-06-20 ENCOUNTER — Telehealth: Payer: Self-pay | Admitting: Family

## 2015-06-20 NOTE — Telephone Encounter (Signed)
[employer] audited pharmacy and need notes with maximum dose. Needs to be on [place] letterhead and signed.  Ryan [name] was notified about a week ago and he has to have it today. I am getting copy of info. Will Dr. Abner Greenspan sign?

## 2015-06-20 NOTE — Telephone Encounter (Signed)
I am willing to sign if someone writes this up.

## 2015-06-23 ENCOUNTER — Encounter: Payer: Self-pay | Admitting: *Deleted

## 2015-06-23 NOTE — Telephone Encounter (Signed)
Ryan [name] will take care of.

## 2015-06-24 NOTE — Telephone Encounter (Signed)
Spoke with pharmacy and they are saying that letter we completed needs a couple of minor adjustments. Must include Rx # and dates prescribed. New letter printed with additional information and forwarded to PCP to sign.

## 2015-06-24 NOTE — Telephone Encounter (Signed)
Letter signed and given to pharmacy.

## 2015-07-15 ENCOUNTER — Ambulatory Visit: Payer: Commercial Managed Care - HMO | Admitting: Internal Medicine

## 2015-07-23 ENCOUNTER — Ambulatory Visit: Payer: Commercial Managed Care - HMO | Admitting: Family

## 2015-07-25 ENCOUNTER — Other Ambulatory Visit: Payer: Self-pay | Admitting: Family

## 2015-08-10 DIAGNOSIS — I219 Acute myocardial infarction, unspecified: Secondary | ICD-10-CM

## 2015-08-10 HISTORY — DX: Acute myocardial infarction, unspecified: I21.9

## 2015-08-12 ENCOUNTER — Ambulatory Visit: Payer: Commercial Managed Care - HMO | Admitting: Family

## 2015-08-12 ENCOUNTER — Telehealth: Payer: Self-pay | Admitting: Internal Medicine

## 2015-08-12 MED ORDER — INSULIN ASPART 100 UNIT/ML ~~LOC~~ SOLN
22.0000 [IU] | Freq: Three times a day (TID) | SUBCUTANEOUS | Status: DC
Start: 2015-08-12 — End: 2015-11-05

## 2015-08-12 MED FILL — NovoLOG 100 UNIT/ML SOLN: 100 | 30 days supply | Qty: 20 | Fill #0

## 2015-08-12 NOTE — Telephone Encounter (Signed)
Pt does not have any allergies to Novolog. Switched to [employer].

## 2015-08-12 NOTE — Telephone Encounter (Signed)
FYI: Insurance has decided it [employer] practitioner and is now going back to [employer]

## 2015-08-18 ENCOUNTER — Ambulatory Visit: Payer: Commercial Managed Care - HMO | Admitting: Internal Medicine

## 2015-08-28 ENCOUNTER — Telehealth: Payer: Self-pay | Admitting: Family

## 2015-08-28 MED FILL — METOPROLOL SUCC ER 25 MG TA: 25 | 30 days supply | Qty: 30 | Fill #1

## 2015-08-28 MED FILL — PRAVASTATIN NA 80 MG TAB: 80 | 30 days supply | Qty: 30 | Fill #1

## 2015-08-29 MED FILL — AMLODIPINE BESYLATE 5 MG TA: 5 | 30 days supply | Qty: 30 | Fill #0

## 2015-08-29 NOTE — Telephone Encounter (Signed)
30 day supply of amlodipine sent to pharmacy. Pt is due for follow up with PCP now and has not r/s appt that was for 08/12/15.  Please call pt to arrange appt with [name].  Thanks!

## 2015-09-01 NOTE — Telephone Encounter (Signed)
Called pt,pt has been scheduled

## 2015-09-08 ENCOUNTER — Encounter: Payer: Self-pay | Admitting: Internal Medicine

## 2015-09-08 ENCOUNTER — Other Ambulatory Visit (INDEPENDENT_AMBULATORY_CARE_PROVIDER_SITE_OTHER): Payer: Commercial Managed Care - HMO | Admitting: *Deleted

## 2015-09-08 ENCOUNTER — Other Ambulatory Visit: Payer: Self-pay | Admitting: *Deleted

## 2015-09-08 ENCOUNTER — Ambulatory Visit (INDEPENDENT_AMBULATORY_CARE_PROVIDER_SITE_OTHER): Payer: Commercial Managed Care - HMO | Admitting: Internal Medicine

## 2015-09-08 VITALS — BP 118/66 | HR 65 | Temp 97.7°F | Resp 12 | Wt 175.0 lb

## 2015-09-08 DIAGNOSIS — Z794 Long term (current) use of insulin: Secondary | ICD-10-CM

## 2015-09-08 DIAGNOSIS — E1165 Type 2 diabetes mellitus with hyperglycemia: Secondary | ICD-10-CM

## 2015-09-08 DIAGNOSIS — E1159 Type 2 diabetes mellitus with other circulatory complications: Secondary | ICD-10-CM | POA: Insufficient documentation

## 2015-09-08 DIAGNOSIS — E118 Type 2 diabetes mellitus with unspecified complications: Secondary | ICD-10-CM | POA: Insufficient documentation

## 2015-09-08 HISTORY — DX: Type 2 diabetes mellitus with hyperglycemia: E11.65

## 2015-09-08 HISTORY — DX: Long term (current) use of insulin: Z79.4

## 2015-09-08 LAB — POCT GLYCOSYLATED HEMOGLOBIN (HGB A1C): Hemoglobin A1C: 7.4

## 2015-09-08 MED ORDER — METFORMIN HCL 500 MG PO TABS: 1000.0000 mg | ORAL_TABLET | Freq: Every day | ORAL | Status: DC

## 2015-09-08 MED FILL — metFORMIN HCL 500 MG TABS: 500 | 60 days supply | Qty: 120 | Fill #0

## 2015-09-08 NOTE — Progress Notes (Signed)
Patient ID: Ryan [name], male   DOB: 10-14-1945, 70 y.o.   MRN: [id number]  HPI: Ryan [name] is a 70 y.o.-year-old male, returning for f/u for DM2 (?1), dx 1999, insulin-dependent since 2005, uncontrolled, without complications. Last visit 5 mo ago.  Sugars were higher over the Holidays.  He was on the insulin pump after dx. He was taken off the pump ~2005 by the [place] as this was not covered anymore.   Last hemoglobin A1c was: Lab Results  Component Value Date   HGBA1C 7.1* 04/11/2015   HGBA1C 7.1* 01/07/2015   HGBA1C 8.4* 09/09/2014   He is now on: - Levemir 37 units at bedtime - Humalog mealtime with an ICR 1:15 units 3x a day 15 min before a meal - Humalog sliding scale as follows:  - 150-175: + 1 unit  - 176-200: + 2 units  - 201-225: + 3 units  - 226-250: + 4 units  - >250: + 5 units  Pt checks his sugars 6-8 a day and they are: - am: 126-200 >> 50, 80-140, 190 >> 84-220 >> 75-143, 160 (stopped eating after 8 am) >> 77, 100-180, 220 - 2h after b'fast: 77-150 >> 70, 74-140 >> 75, 80-136 >> 72-154 >> 70-160 - before lunch: 70-143 >> 70-130 >> 75, 80-140, 180 >> 86-120 >> 80-141 - 2h after lunch: 43 x1, 70-129 >> 70-131 >> 70-120 >> 78-130 >> 70-163 - before dinner: 94-150 >> 72-120 >> 101-150, 170 >> 78-148 >> 78-166 - 2h after dinner: 87-145 >> 75-140, 180 >> 82-164 >> 78-140, 158 >> 81-173, 214 - bedtime: n/c >> 78-179 >> 100-164 - nighttime: n/c >> 130-200 + pm lows. Lowest sugar was 49 >> 78 >> 70. he has hypoglycemia awareness at 60.  Highest sugar was 180 >> 177 >> 220.  Pt's meals are: - Breakfast: egg beaters + toast - Lunch: cold cuts or salads - Dinner: meat + veggies - Snacks: fruit Walks 45 miles a week.  - no CKD, last BUN/creatinine:  Lab Results  Component Value Date   BUN 9 02/04/2015   CREATININE 0.94 02/04/2015  On Lisinopril. - last set of lipids: Lab Results  Component Value Date   CHOL 162 02/04/2015   HDL 42.90 02/04/2015   LDLCALC 104*  02/04/2015   TRIG 77.0 02/04/2015   CHOLHDL 4 02/04/2015  On Pravastatin. - last eye exam was: Component Date Value  . HM Diabetic Eye Exam 02/24/2015 No Retinopathy    - no numbness and tingling in his feet. Foot exam normal in 03/2015.  Last year, e had 3 deaths in the family within 4 months: His mother, his father, and his wife (ovarian cancer). He is taking care of his 28 year old grandson.  I reviewed pt's medications, allergies, PMH, social hx, family hx, and changes were documented in the history of present illness. Otherwise, unchanged from my initial visit note.  ROS: Constitutional: + weight loss, no subjective hyperthermia/hypothermia, + excessive urination Eyes: no blurry vision, no xerophthalmia ENT: no sore throat, no nodules palpated in throat, no dysphagia/odynophagia, no hoarseness Cardiovascular: no CP/SOB/palpitations/leg swelling Respiratory: no cough/SOB Gastrointestinal: no N/V/D/C/+ heartburn Musculoskeletal: no muscle/+ joint aches Skin: no rashes Neurological: no tremors/numbness/tingling/dizziness + diff with erections  PE: BP 118/66 mmHg  Pulse 65  Temp(Src) 97.7 F (36.5 C) (Oral)  Resp 12  Wt 175 lb (79.379 kg)  SpO2 96% Body mass index is 25.48 kg/(m^2).   Wt Readings from Last 3 Encounters:  09/08/15 175 lb (79.379  kg)  04/01/15 180 lb (81.647 kg)  02/04/15 180 lb 2 oz (81.704 kg)   Constitutional: normal weight, in NAD Eyes: PERRLA, EOMI, no exophthalmos ENT: moist mucous membranes, no thyromegaly, no cervical lymphadenopathy Cardiovascular: RRR, No MRG Respiratory: CTA B Gastrointestinal: abdomen soft, NT, ND, BS+ Musculoskeletal: no deformities, strength intact in all 4 Skin: moist, warm, no rashes Neurological: no tremor with outstretched hands, DTR normal in all 4  ASSESSMENT: 1. DM2 or 1, insulin-dependent, uncontrolled, without complications  PLAN:  1. Patient with long-standing, now fairly controlled diabetes, on basal-bolus  insulin regimen, with higher am sugars lately. We will add Metformin at dinnertime to hopefully reduce the am sugars.  - I suggested to:  Patient Instructions  Please continue: - Levemir 37 units at bedtime - Humalog mealtime with an ICR 1:15 units 3x a day 15 min before a meal - Humalog sliding scale as follows:  - 150-175: + 1 unit  - 176-200: + 2 units  - 201-225: + 3 units  - 226-250: + 4 units  - >250: + 5 units  Please add Metformin 500 mg with dinner x 3 days, then increase to 1000 mg with dinner if sugars in am are still >150.  Please return in 3 months with your sugar log.   - he has an exemplary sugar log - advised for yearly eye exams - he is up to date - UTD with flu shot - check HbA1c today >> 7.4% (slightly higher) - Return to clinic in 3 mo with sugar log

## 2015-09-08 NOTE — Patient Instructions (Signed)
Please continue: - Levemir 37 units at bedtime - Humalog mealtime with an ICR 1:15 units 3x a day 15 min before a meal - Humalog sliding scale as follows:  - 150-175: + 1 unit  - 176-200: + 2 units  - 201-225: + 3 units  - 226-250: + 4 units  - >250: + 5 units  Please add Metformin 500 mg with dinner x 3 days, then increase to 1000 mg with dinner if sugars in am are still >150.  Please return in 3 months with your sugar log.

## 2015-09-09 ENCOUNTER — Telehealth: Payer: Self-pay | Admitting: Family

## 2015-09-09 ENCOUNTER — Telehealth: Payer: Self-pay | Admitting: *Deleted

## 2015-09-09 ENCOUNTER — Encounter: Payer: Self-pay | Admitting: Family

## 2015-09-09 ENCOUNTER — Ambulatory Visit (INDEPENDENT_AMBULATORY_CARE_PROVIDER_SITE_OTHER): Payer: Commercial Managed Care - HMO | Admitting: Family

## 2015-09-09 VITALS — BP 134/78 | HR 60 | Temp 97.6°F | Resp 16 | Ht 69.5 in | Wt 175.4 lb

## 2015-09-09 DIAGNOSIS — Z794 Long term (current) use of insulin: Secondary | ICD-10-CM | POA: Diagnosis not present

## 2015-09-09 DIAGNOSIS — E785 Hyperlipidemia, unspecified: Secondary | ICD-10-CM

## 2015-09-09 DIAGNOSIS — E876 Hypokalemia: Secondary | ICD-10-CM

## 2015-09-09 DIAGNOSIS — I10 Essential (primary) hypertension: Secondary | ICD-10-CM | POA: Diagnosis not present

## 2015-09-09 DIAGNOSIS — E1165 Type 2 diabetes mellitus with hyperglycemia: Secondary | ICD-10-CM | POA: Diagnosis not present

## 2015-09-09 LAB — LIPID PANEL
Cholesterol: 181 mg/dL (ref 0–200)
HDL: 46.6 mg/dL (ref >39.00)
LDL Cholesterol: 110 mg/dL — ABNORMAL HIGH (ref 0–99)
NonHDL: 134.84
Total CHOL/HDL Ratio: 4
Triglycerides: 123 mg/dL (ref 0.0–149.0)
VLDL: 24.6 mg/dL (ref 0.0–40.0)

## 2015-09-09 LAB — BASIC METABOLIC PANEL
BUN: 15 mg/dL (ref 6–23)
CO2: 27 mEq/L (ref 19–32)
Calcium: 10.4 mg/dL (ref 8.4–10.5)
Chloride: 100 mEq/L (ref 96–112)
Creatinine, Ser: 0.99 mg/dL (ref 0.40–1.50)
GFR: 79.59 mL/min (ref >60.00)
Glucose, Bld: 42 mg/dL — CL (ref 70–99)
Potassium: 3.3 mEq/L — ABNORMAL LOW (ref 3.5–5.1)
Sodium: 138 mEq/L (ref 135–145)

## 2015-09-09 MED ORDER — AMLODIPINE BESYLATE 5 MG PO TABS: 5.0000 mg | ORAL_TABLET | Freq: Every day | ORAL | Status: DC

## 2015-09-09 MED ORDER — ATORVASTATIN CALCIUM 40 MG PO TABS: 40.0000 mg | ORAL_TABLET | Freq: Every day | ORAL | Status: DC

## 2015-09-09 NOTE — Telephone Encounter (Signed)
Potassium is low.  Focus on high potassium foods, repeat bmet in 1 week, dx hypokalemia.   High potassium content foods  Highest content (>25 meq/100 g) High content (>6.2 meq/100 g)   Vegetables   Spinach   Tomatoes   Broccoli   Winter squash   Beets   Carrots   Cauliflower   Potatoes   Fruits   Bananas  Dried figs Cantaloupe  Molasses Kiwis  Seaweed Oranges  Very high content (>12.5 meq/100 g) Mangos  Dried fruits (dates, prunes) Meats  Nuts Ground beef  Avocados Steak  Bran cereals Pork  Wheat germ Veal  Lima beans Randa Lynn

## 2015-09-09 NOTE — Telephone Encounter (Signed)
Also, LDL above goal. D/c pravastatin, start atorvastatin which is stronger. Repeat FLP in 3 months. Dx hyperlipidemia

## 2015-09-09 NOTE — Telephone Encounter (Signed)
Reports that sugar was 96 a few minutes ago.  Had apple juice after leaving lab. Feeling better. Advised pt to call Dr. Elvera [name] if any further hypoglycemia.  Pt verbalizes understanding. Only had a small amount of toast this AM.

## 2015-09-09 NOTE — Telephone Encounter (Signed)
[facility] reporting critical pts glucose @ 42

## 2015-09-09 NOTE — Assessment & Plan Note (Signed)
LDL slightly above goal.  D/c pravastatin, start atorvastatin.  See phone note 09/09/15

## 2015-09-09 NOTE — Assessment & Plan Note (Signed)
BP stable on current meds. Continue same.  

## 2015-09-09 NOTE — Patient Instructions (Signed)
Please complete lab work prior to leaving.   

## 2015-09-09 NOTE — Progress Notes (Signed)
Pre visit review using our clinic review tool, if applicable. No additional management support is needed unless otherwise documented below in the visit note. 

## 2015-09-09 NOTE — Assessment & Plan Note (Signed)
Lab Results  Component Value Date   HGBA1C 7.4 09/08/2015   A1C above goal, but he did have hypoglycemic event (see phone note for details).  Defer management to Endo.

## 2015-09-09 NOTE — Progress Notes (Signed)
Subjective:    Patient ID: Ryan [name], male    DOB: May 15, 1946, 70 y.o.   MRN: [id number]  HPI  [name] is a 70 yr old male who presents today for follow up.  1) HTN- mainatined on amlodipine, lisinopril, toprol xl.   BP Readings from Last 3 Encounters:  09/09/15 134/78  09/08/15 118/66  04/01/15 124/68   2) Hyperlipidemia- non-fasting today. Maintained on pravastatin. Lab Results  Component Value Date   CHOL 162 02/04/2015   HDL 42.90 02/04/2015   LDLCALC 104* 02/04/2015   TRIG 77.0 02/04/2015   CHOLHDL 4 02/04/2015   3) DM2- He is now following with Dr. Elvera Lennox.  Lab Results  Component Value Date   HGBA1C 7.4 09/08/2015   HGBA1C 7.1* 04/11/2015   HGBA1C 7.1* 01/07/2015   Lab Results  Component Value Date   MICROALBUR <0.7 02/04/2015   LDLCALC 104* 02/04/2015   CREATININE 0.94 02/04/2015     Review of Systems  Respiratory: Negative for shortness of breath.   Cardiovascular: Negative for chest pain and leg swelling.   Past Medical History  Diagnosis Date  . Diabetes mellitus   . History of chicken pox   . GERD (gastroesophageal reflux disease)   . Allergy   . Hyperlipidemia   . Hypertension     Social History   Social History  . Marital Status: Married    Spouse Name: N/A  . Number of Children: 1  . Years of Education: N/A   Occupational History  . Not on file.   Social History Main Topics  . Smoking status: Former Smoker    Types: Cigarettes    Quit date: 09/01/1965  . Smokeless tobacco: Never Used  . Alcohol Use: Not on file  . Drug Use: No  . Sexual Activity: Not on file   Other Topics Concern  . Not on file   Social History Narrative   Regular exercise:  5 x weekly   Caffeine Use: 1 cups coffee daily.   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   Married- One son and grandson- both live with them   Wife has ovarian cancer             Past Surgical History  Procedure Laterality Date  . Shoulder surgery  1990    right shoulder,  torn rotator cuff.  . Eye surgery  1998    bilateral    Family History  Problem Relation Age of Onset  . Cancer Mother     cancer  . Heart disease Mother   . Hyperlipidemia Mother   . Hypertension Mother   . Hyperlipidemia Father   . Stroke Father   . Hypertension Father   . Diabetes Paternal Grandfather     No Known Allergies  Current Outpatient Prescriptions on File Prior to Visit  Medication Sig Dispense Refill  . ACCU-CHEK AVIVA PLUS test strip     . ACCU-CHEK SOFTCLIX LANCETS lancets     . aspirin 81 MG tablet Take 81 mg by mouth daily.      . Calcium Carbonate-Vitamin D (CALTRATE 600+D) 600-400 MG-UNIT per tablet Take 1 tablet by mouth 2 (two) times daily.      . insulin aspart (NOVOLOG) 100 UNIT/ML injection Inject 22 Units into the skin 3 (three) times daily with meals. With sliding scale. **PT NEEDS FOLLOW UP APPT** 30 mL 1  . Insulin Syringe-Needle U-100 30G X 3/8" 1 ML MISC Use to inject insulin 4 times daily. 200 each 2  .  LEVEMIR 100 UNIT/ML injection INJECT 0.37 ML (37 UNITS) INTO THE SKIN AT BEDTIME 20 mL 2  . lisinopril (PRINIVIL,ZESTRIL) 10 MG tablet TAKE 1 TABLET (10 MG) BY MOUTH DAILY. 90 tablet 1  . metFORMIN (GLUCOPHAGE) 500 MG tablet Take 2 tablets (1,000 mg total) by mouth daily with supper. 120 tablet 1  . metoprolol succinate (TOPROL-XL) 25 MG 24 hr tablet TAKE 1 TABLET (25 MG) BY MOUTH DAILY. 30 tablet 5  . pravastatin (PRAVACHOL) 80 MG tablet TAKE 1 TABLET BY MOUTH DAILY. 30 tablet 5   No current facility-administered medications on file prior to visit.    BP 134/78 mmHg  Pulse 60  Temp(Src) 97.6 F (36.4 C) (Oral)  Resp 16  Ht 5' 9.5" (1.765 m)  Wt 175 lb 6.4 oz (79.561 kg)  BMI 25.54 kg/m2  SpO2 99%       Objective:   Physical Exam  Constitutional: He is oriented to person, place, and time. He appears well-developed and well-nourished. No distress.  HENT:  Head: Normocephalic and atraumatic.  Cardiovascular: Normal rate and regular  rhythm.   No murmur heard. Pulmonary/Chest: Effort normal and breath sounds normal. No respiratory distress. He has no wheezes. He has no rales.  Musculoskeletal: He exhibits no edema.  Lymphadenopathy:    He has no cervical adenopathy.  Neurological: He is alert and oriented to person, place, and time.  Skin: Skin is warm and dry.  Psychiatric: He has a normal mood and affect. His behavior is normal. Thought content normal.          Assessment & Plan:

## 2015-09-10 MED FILL — ATORVASTATIN 40 MG TABLET: 40 | 30 days supply | Qty: 30 | Fill #0

## 2015-09-10 MED FILL — NovoLOG 100 UNIT/ML SOLN: 100 | 30 days supply | Qty: 20 | Fill #1

## 2015-09-10 NOTE — Telephone Encounter (Signed)
Notified pt and he voices understanding. Scheduled repeat bmet for 09/15/15 at 10am and repeat lipids on 12/10/15 at 9am. Future orders entered.

## 2015-09-17 ENCOUNTER — Other Ambulatory Visit (INDEPENDENT_AMBULATORY_CARE_PROVIDER_SITE_OTHER): Payer: Commercial Managed Care - HMO

## 2015-09-17 ENCOUNTER — Encounter: Payer: Self-pay | Admitting: Family

## 2015-09-17 DIAGNOSIS — E785 Hyperlipidemia, unspecified: Secondary | ICD-10-CM | POA: Diagnosis not present

## 2015-09-17 DIAGNOSIS — E876 Hypokalemia: Secondary | ICD-10-CM | POA: Diagnosis not present

## 2015-09-17 LAB — LIPID PANEL
Cholesterol: 147 mg/dL (ref 0–200)
HDL: 46.5 mg/dL (ref >39.00)
LDL Cholesterol: 81 mg/dL (ref 0–99)
NonHDL: 100.3
Total CHOL/HDL Ratio: 3
Triglycerides: 96 mg/dL (ref 0.0–149.0)
VLDL: 19.2 mg/dL (ref 0.0–40.0)

## 2015-09-17 LAB — BASIC METABOLIC PANEL
BUN: 13 mg/dL (ref 6–23)
CO2: 29 mEq/L (ref 19–32)
Calcium: 10.2 mg/dL (ref 8.4–10.5)
Chloride: 102 mEq/L (ref 96–112)
Creatinine, Ser: 1 mg/dL (ref 0.40–1.50)
GFR: 78.66 mL/min (ref >60.00)
Glucose, Bld: 132 mg/dL — ABNORMAL HIGH (ref 70–99)
Potassium: 4 mEq/L (ref 3.5–5.1)
Sodium: 139 mEq/L (ref 135–145)

## 2015-10-02 ENCOUNTER — Other Ambulatory Visit: Payer: Self-pay | Admitting: Internal Medicine

## 2015-10-02 MED FILL — LEVEMIR 100 UNITS/ML VIAL: 100 | 55 days supply | Qty: 20 | Fill #0

## 2015-10-02 MED FILL — AMLODIPINE BESYLATE 5 MG TA: 5 | 30 days supply | Qty: 30 | Fill #0

## 2015-10-02 MED FILL — METOPROLOL SUCC ER 25 MG TA: 25 | 30 days supply | Qty: 30 | Fill #2

## 2015-10-07 MED FILL — ATORVASTATIN 40 MG TABLET: 40 | 30 days supply | Qty: 30 | Fill #1

## 2015-10-07 MED FILL — NovoLOG 100 UNIT/ML SOLN: 100 | 30 days supply | Qty: 20 | Fill #2

## 2015-10-30 ENCOUNTER — Telehealth: Payer: Self-pay | Admitting: Family

## 2015-10-30 NOTE — Telephone Encounter (Signed)
Cologuard completed on 02/27/15.  The same updated in pt's chart.  Pt notified and made aware.  No further questions or concerns voiced at this time.

## 2015-10-30 NOTE — Telephone Encounter (Signed)
Relation to KI:SNGX Call back number:[phone]   Reason for call:  Patent received a reminder thru an automated call reminding patient he's due for his Colonoscopy. Patient states he had one done last year thru a kit. Please advise.

## 2015-11-03 ENCOUNTER — Telehealth: Payer: Self-pay | Admitting: Internal Medicine

## 2015-11-05 ENCOUNTER — Other Ambulatory Visit: Payer: Self-pay | Admitting: Internal Medicine

## 2015-11-05 MED FILL — AMLODIPINE BESYLATE 5 MG TA: 5 | 30 days supply | Qty: 30 | Fill #1

## 2015-11-05 MED FILL — ATORVASTATIN 40 MG TABLET: 40 | 30 days supply | Qty: 30 | Fill #2

## 2015-11-05 MED FILL — METOPROLOL SUCC ER 25 MG TA: 25 | 30 days supply | Qty: 30 | Fill #3

## 2015-11-05 MED FILL — NovoLOG 100 UNIT/ML SOLN: 100 | 45 days supply | Qty: 30 | Fill #0

## 2015-11-05 MED FILL — LISINOPRIL 10 MG TABLET: 10 | 90 days supply | Qty: 90 | Fill #1

## 2015-11-05 MED FILL — metFORMIN HCL 500 MG TABS: 500 | 60 days supply | Qty: 120 | Fill #1

## 2015-11-11 NOTE — Telephone Encounter (Signed)
error 

## 2015-12-02 MED FILL — LEVEMIR 100 UNITS/ML VIAL: 100 | 55 days supply | Qty: 20 | Fill #1

## 2015-12-08 ENCOUNTER — Encounter: Payer: Self-pay | Admitting: Internal Medicine

## 2015-12-08 ENCOUNTER — Other Ambulatory Visit: Payer: Self-pay | Admitting: Family

## 2015-12-08 ENCOUNTER — Other Ambulatory Visit (INDEPENDENT_AMBULATORY_CARE_PROVIDER_SITE_OTHER): Payer: Commercial Managed Care - HMO | Admitting: *Deleted

## 2015-12-08 ENCOUNTER — Ambulatory Visit (INDEPENDENT_AMBULATORY_CARE_PROVIDER_SITE_OTHER): Payer: Commercial Managed Care - HMO | Admitting: Internal Medicine

## 2015-12-08 VITALS — BP 110/68 | HR 70 | Wt 174.8 lb

## 2015-12-08 DIAGNOSIS — E1165 Type 2 diabetes mellitus with hyperglycemia: Secondary | ICD-10-CM

## 2015-12-08 DIAGNOSIS — Z794 Long term (current) use of insulin: Secondary | ICD-10-CM

## 2015-12-08 LAB — POCT GLYCOSYLATED HEMOGLOBIN (HGB A1C): Hemoglobin A1C: 7.6

## 2015-12-08 MED FILL — AMLODIPINE BESYLATE 5 MG TA: 5 | 30 days supply | Qty: 30 | Fill #2

## 2015-12-08 MED FILL — METOPROLOL SUCC ER 25 MG TA: 25 | 30 days supply | Qty: 30 | Fill #4

## 2015-12-08 NOTE — Progress Notes (Addendum)
Patient ID: Ryan [name], male   DOB: Dec 31, 1945, 70 y.o.   MRN: [id number]  HPI: Ryan [name] is a 70 y.o.-year-old male, returning for f/u for DM2 (?1), dx 1999, insulin-dependent since 2005, uncontrolled, without complications. Last visit 3 mo ago.  He was on the insulin pump after dx. He was taken off the pump ~2005 by the [place] as this was not covered anymore.   Last hemoglobin A1c was: Lab Results  Component Value Date   HGBA1C 7.4 09/08/2015   HGBA1C 7.1* 04/11/2015   HGBA1C 7.1* 01/07/2015   He is now on: - Levemir 37 units at bedtime - Humalog mealtime with an ICR 1:15 units 3x a day 15 min before a meal - Humalog sliding scale as follows:  - 150-175: + 1 unit  - 176-200: + 2 units  - 201-225: + 3 units  - 226-250: + 4 units  - >250: + 5 units - Metformin 1000 mg with dinner (added 08/2015)  Pt checks his sugars 6-8 a day and they are same (higher in am  - 200s - during a URI) - per exemplary log (!):  - am: 126-200 >> 50, 80-140, 190 >> 84-220 >> 75-143, 160 (stopped eating after 8 am) >> 77, 100-180, 220 >> 70-200 - 2h after b'fast: 77-150 >> 70, 74-140 >> 75, 80-136 >> 72-154 >> 70-160 >> 70-158 - before lunch: 70-143 >> 70-130 >> 75, 80-140, 180 >> 86-120 >> 80-141 >> 70-125, 166 - 2h after lunch: 43 x1, 70-129 >> 70-131 >> 70-120 >> 78-130 >> 70-163 >> 75-135, 161 - before dinner: 94-150 >> 72-120 >> 101-150, 170 >> 78-148 >> 78-166 >> 70-145, 171 - 2h after dinner: 87-145 >> 75-140, 180 >> 82-164 >> 78-140, 158 >> 81-173, 214 >> 75-157 - bedtime: n/c >> 78-179 >> 100-164 >> 91-180 - nighttime: n/c >> 130-200 >> 108-195, 200 Lowest sugar was 49 >> 78 >> 70 >> 70. he has hypoglycemia awareness at 60.  Highest sugar was 180 >> 177 >> 220 >> 200.  Pt's meals are: - Breakfast: egg beaters + toast - Lunch: cold cuts or salads - Dinner: meat + veggies - Snacks: fruit Walks 45 miles a week.  - no CKD, last BUN/creatinine:  Lab Results  Component Value Date   BUN 13  09/17/2015   CREATININE 1.00 09/17/2015  On Lisinopril. - last set of lipids: Lab Results  Component Value Date   CHOL 147 09/17/2015   HDL 46.50 09/17/2015   LDLCALC 81 09/17/2015   TRIG 96.0 09/17/2015   CHOLHDL 3 09/17/2015  On Pravastatin. - last eye exam was: Component Date Value  . HM Diabetic Eye Exam 02/24/2015 No Retinopathy    - no numbness and tingling in his feet. Foot exam normal in 03/2015.  In 2016, he had 3 deaths in the family within 4 months: His mother, his father, and his wife (ovarian cancer). He is taking care of his 67 year old grandson.  I reviewed pt's medications, allergies, PMH, social hx, family hx, and changes were documented in the history of present illness. Otherwise, unchanged from my initial visit note.  ROS: Constitutional: no weight loss or gain, no subjective hyperthermia/hypothermia Eyes: no blurry vision, no xerophthalmia ENT: no sore throat, no nodules palpated in throat, no dysphagia/odynophagia, no hoarseness Cardiovascular: no CP/SOB/palpitations/leg swelling Respiratory: no cough/SOB Gastrointestinal: no N/V/D/C/heartburn Musculoskeletal: no muscle/joint aches Skin: no rashes Neurological: no tremors/numbness/tingling/dizziness  PE: BP 110/68 mmHg  Pulse 70  Wt 174 lb 12.8 oz (  79.289 kg)  SpO2 97% Body mass index is 25.45 kg/(m^2).    Wt Readings from Last 3 Encounters:  12/08/15 174 lb 12.8 oz (79.289 kg)  09/09/15 175 lb 6.4 oz (79.561 kg)  09/08/15 175 lb (79.379 kg)   Constitutional: normal weight, in NAD Eyes: PERRLA, EOMI, no exophthalmos ENT: moist mucous membranes, no thyromegaly, no cervical lymphadenopathy Cardiovascular: RRR, No MRG Respiratory: CTA B Gastrointestinal: abdomen soft, NT, ND, BS+ Musculoskeletal: no deformities, strength intact in all 4 Skin: moist, warm, no rashes Neurological: no tremor with outstretched hands, DTR normal in all 4  ASSESSMENT: 1. DM2 or 1, insulin-dependent, uncontrolled,  without complications  PLAN:  1. Patient with long-standing, fairly controlled diabetes, on basal-bolus insulin regimen, with still higher am sugars, despite adding Metformin at dinnertime at last visit.   - I suggested to:  Patient Instructions  Please continue: - Levemir 37 units at bedtime - Humalog mealtime with an ICR 1:15 units 3x a day 15 min before a meal - Humalog sliding scale as follows:  - 150-175: + 1 unit  - 176-200: + 2 units  - 201-225: + 3 units  - 226-250: + 4 units  - >250: + 5 units - Metformin 1000 mg with dinner daily  Please return in 3 months with your sugar log.   Please have a fructosamine level drawn at next lab draw.  - he has an exemplary sugar log - advised for yearly eye exams - he is up to date - UTD with flu shot - check HbA1c today >> 7.6% (higher), however, sugars not clearly explaining the high hbA1`c >> will check a fructosamine at next lab draw in 2 days - Return to clinic in 3 mo with sugar log   Fructosamine 282 >>  the calculated HbA1c is 6.4%, which is excellent!

## 2015-12-08 NOTE — Patient Instructions (Signed)
Please continue: - Levemir 37 units at bedtime - Humalog mealtime with an ICR 1:15 units 3x a day 15 min before a meal - Humalog sliding scale as follows:  - 150-175: + 1 unit  - 176-200: + 2 units  - 201-225: + 3 units  - 226-250: + 4 units  - >250: + 5 units - Metformin 1000 mg with dinner daily  Please return in 3 months with your sugar log.   Please have a fructosamine level drawn at next lab draw.

## 2015-12-09 MED FILL — ATORVASTATIN 40 MG TABLET: 40 | 30 days supply | Qty: 30 | Fill #0

## 2015-12-10 ENCOUNTER — Other Ambulatory Visit (INDEPENDENT_AMBULATORY_CARE_PROVIDER_SITE_OTHER): Payer: Commercial Managed Care - HMO

## 2015-12-10 DIAGNOSIS — E1165 Type 2 diabetes mellitus with hyperglycemia: Secondary | ICD-10-CM

## 2015-12-10 DIAGNOSIS — Z794 Long term (current) use of insulin: Secondary | ICD-10-CM | POA: Diagnosis not present

## 2015-12-16 MED FILL — NovoLOG 100 UNIT/ML SOLN: 100 | 45 days supply | Qty: 30 | Fill #1

## 2015-12-19 ENCOUNTER — Encounter: Payer: Self-pay | Admitting: Family

## 2015-12-19 ENCOUNTER — Telehealth: Payer: Self-pay | Admitting: Family

## 2015-12-19 NOTE — Telephone Encounter (Signed)
Called patient back.  Lab results from 12/10/15 are still pending.  Pt made aware.  He stated understanding.  Would like a call back when results are available.

## 2015-12-19 NOTE — Telephone Encounter (Addendum)
error:[id number] ° °

## 2015-12-19 NOTE — Telephone Encounter (Signed)
Relation to GY:BWLS Call back [phone]   Reason for call:  Patient inquiring about lab results taken 12/10/15

## 2015-12-22 NOTE — Telephone Encounter (Signed)
Please let pt know that I will forward this to Dr. Elvera Lennox since she ordered the test.  Dr. Elvera Lennox, I will send to scan, but fructosamine level as 283.  Thanks.

## 2015-12-22 NOTE — Telephone Encounter (Signed)
Received lab result via fax from [facility] concerning Fructosamine. Forwarded result to [facility]. JG//CMA

## 2015-12-23 NOTE — Telephone Encounter (Signed)
Called pt and advised him per Dr Charlean Sanfilippo message. Pt was pleased.

## 2015-12-23 NOTE — Telephone Encounter (Signed)
Thank you Ryan [name], I am not sure what happened. Will let him know that the calculated HbA1c is 6.4%, which is excellent! [name], can you please call him with the result?

## 2016-01-09 ENCOUNTER — Other Ambulatory Visit: Payer: Self-pay | Admitting: Internal Medicine

## 2016-01-09 MED FILL — ATORVASTATIN 40 MG TABLET: 40 | 30 days supply | Qty: 30 | Fill #1

## 2016-01-09 MED FILL — metFORMIN HCL 500 MG TABS: 500 | 60 days supply | Qty: 120 | Fill #0

## 2016-01-09 MED FILL — AMLODIPINE BESYLATE 5 MG TA: 5 | 30 days supply | Qty: 30 | Fill #3

## 2016-01-09 MED FILL — METOPROLOL SUCC ER 25 MG TA: 25 | 30 days supply | Qty: 30 | Fill #5

## 2016-01-26 ENCOUNTER — Telehealth: Payer: Self-pay | Admitting: Family

## 2016-01-26 MED FILL — NovoLOG 100 UNIT/ML SOLN: 100 | 45 days supply | Qty: 30 | Fill #0

## 2016-01-26 MED FILL — LEVEMIR 100 UNITS/ML VIAL: 100 | 55 days supply | Qty: 20 | Fill #0

## 2016-01-26 NOTE — Telephone Encounter (Signed)
Notified pt's son and he scheduled pt for OV tomorrow with PA, Daphine Deutscher.

## 2016-01-26 NOTE — Telephone Encounter (Signed)
Needs OV please.

## 2016-01-26 NOTE — Telephone Encounter (Signed)
Caller name: Francesca Jewett Relationship to patient: Daughter In-Law Can be reached: [phone]   Reason for call: Daughter In-Law states that for the past week patient has been having stomach issues. States he has not been eating. This morning his throat is irritated and he is having problems swallowing. Request call back for advice on what they can do because he is a diabetic.

## 2016-01-27 ENCOUNTER — Ambulatory Visit (INDEPENDENT_AMBULATORY_CARE_PROVIDER_SITE_OTHER): Payer: Commercial Managed Care - HMO | Admitting: Physician Assistant

## 2016-01-27 ENCOUNTER — Inpatient Hospital Stay (HOSPITAL_BASED_OUTPATIENT_CLINIC_OR_DEPARTMENT_OTHER)
Admission: EM | Admit: 2016-01-27 | Discharge: 2016-01-30 | DRG: 282 | Disposition: A | Discharge status: Home / Self Care | Payer: Commercial Managed Care - HMO | Attending: Cardiology | Admitting: Cardiology

## 2016-01-27 ENCOUNTER — Other Ambulatory Visit: Payer: Self-pay

## 2016-01-27 ENCOUNTER — Encounter: Payer: Self-pay | Admitting: Physician Assistant

## 2016-01-27 ENCOUNTER — Emergency Department (HOSPITAL_BASED_OUTPATIENT_CLINIC_OR_DEPARTMENT_OTHER): Payer: Commercial Managed Care - HMO

## 2016-01-27 ENCOUNTER — Encounter (HOSPITAL_BASED_OUTPATIENT_CLINIC_OR_DEPARTMENT_OTHER): Payer: Self-pay | Admitting: Emergency Medicine

## 2016-01-27 VITALS — BP 82/66 | HR 80 | Temp 97.5°F | Resp 16 | Ht 69.5 in | Wt 159.1 lb

## 2016-01-27 DIAGNOSIS — R7989 Other specified abnormal findings of blood chemistry: Secondary | ICD-10-CM

## 2016-01-27 DIAGNOSIS — I4892 Unspecified atrial flutter: Secondary | ICD-10-CM | POA: Diagnosis present

## 2016-01-27 DIAGNOSIS — I214 Non-ST elevation (NSTEMI) myocardial infarction: Secondary | ICD-10-CM | POA: Diagnosis present

## 2016-01-27 DIAGNOSIS — E119 Type 2 diabetes mellitus without complications: Secondary | ICD-10-CM | POA: Diagnosis present

## 2016-01-27 DIAGNOSIS — Z8249 Family history of ischemic heart disease and other diseases of the circulatory system: Secondary | ICD-10-CM

## 2016-01-27 DIAGNOSIS — Z79899 Other long term (current) drug therapy: Secondary | ICD-10-CM | POA: Diagnosis not present

## 2016-01-27 DIAGNOSIS — Z794 Long term (current) use of insulin: Secondary | ICD-10-CM

## 2016-01-27 DIAGNOSIS — I10 Essential (primary) hypertension: Secondary | ICD-10-CM | POA: Diagnosis present

## 2016-01-27 DIAGNOSIS — E785 Hyperlipidemia, unspecified: Secondary | ICD-10-CM | POA: Diagnosis present

## 2016-01-27 DIAGNOSIS — Z7982 Long term (current) use of aspirin: Secondary | ICD-10-CM | POA: Diagnosis not present

## 2016-01-27 DIAGNOSIS — Z87891 Personal history of nicotine dependence: Secondary | ICD-10-CM

## 2016-01-27 DIAGNOSIS — K219 Gastro-esophageal reflux disease without esophagitis: Secondary | ICD-10-CM | POA: Diagnosis present

## 2016-01-27 DIAGNOSIS — Z833 Family history of diabetes mellitus: Secondary | ICD-10-CM | POA: Diagnosis not present

## 2016-01-27 DIAGNOSIS — I499 Cardiac arrhythmia, unspecified: Secondary | ICD-10-CM | POA: Diagnosis not present

## 2016-01-27 DIAGNOSIS — J029 Acute pharyngitis, unspecified: Secondary | ICD-10-CM

## 2016-01-27 DIAGNOSIS — J069 Acute upper respiratory infection, unspecified: Secondary | ICD-10-CM | POA: Diagnosis present

## 2016-01-27 DIAGNOSIS — I4891 Unspecified atrial fibrillation: Secondary | ICD-10-CM

## 2016-01-27 DIAGNOSIS — E860 Dehydration: Secondary | ICD-10-CM

## 2016-01-27 DIAGNOSIS — R778 Other specified abnormalities of plasma proteins: Secondary | ICD-10-CM | POA: Diagnosis present

## 2016-01-27 LAB — COMPREHENSIVE METABOLIC PANEL
ALT: 54 U/L (ref 17–63)
ALT: 39 U/L (ref 17–63)
AST: 39 U/L (ref 15–41)
AST: 23 U/L (ref 15–41)
Albumin: 4 g/dL (ref 3.5–5.0)
Albumin: 2.6 g/dL — ABNORMAL LOW (ref 3.5–5.0)
Alkaline Phosphatase: 91 U/L (ref 38–126)
Alkaline Phosphatase: 61 U/L (ref 38–126)
Anion gap: 11 (ref 5–15)
Anion gap: 9 (ref 5–15)
BUN: 31 mg/dL — ABNORMAL HIGH (ref 6–20)
BUN: 21 mg/dL — ABNORMAL HIGH (ref 6–20)
CO2: 25 mmol/L (ref 22–32)
CO2: 19 mmol/L — ABNORMAL LOW (ref 22–32)
Calcium: 9.1 mg/dL (ref 8.9–10.3)
Calcium: 7.4 mg/dL — ABNORMAL LOW (ref 8.9–10.3)
Chloride: 95 mmol/L — ABNORMAL LOW (ref 101–111)
Chloride: 103 mmol/L (ref 101–111)
Creatinine, Ser: 1.2 mg/dL (ref 0.61–1.24)
Creatinine, Ser: 1.01 mg/dL (ref 0.61–1.24)
GFR calc Af Amer: >60 mL/min (ref >60)
GFR calc Af Amer: >60 mL/min (ref >60)
GFR calc non Af Amer: 60 mL/min — ABNORMAL LOW (ref >60)
GFR calc non Af Amer: >60 mL/min (ref >60)
Glucose, Bld: 139 mg/dL — ABNORMAL HIGH (ref 65–99)
Glucose, Bld: 242 mg/dL — ABNORMAL HIGH (ref 65–99)
Potassium: 3.9 mmol/L (ref 3.5–5.1)
Potassium: 4.4 mmol/L (ref 3.5–5.1)
Sodium: 131 mmol/L — ABNORMAL LOW (ref 135–145)
Sodium: 131 mmol/L — ABNORMAL LOW (ref 135–145)
Total Bilirubin: 4 mg/dL — ABNORMAL HIGH (ref 0.3–1.2)
Total Bilirubin: 2.8 mg/dL — ABNORMAL HIGH (ref 0.3–1.2)
Total Protein: 7.7 g/dL (ref 6.5–8.1)
Total Protein: 5 g/dL — ABNORMAL LOW (ref 6.5–8.1)

## 2016-01-27 LAB — DIFFERENTIAL
Basophils Absolute: 0 10*3/uL (ref 0.0–0.1)
Basophils Relative: 0 %
Eosinophils Absolute: 0.1 10*3/uL (ref 0.0–0.7)
Eosinophils Relative: 1 %
Lymphocytes Relative: 13 %
Lymphs Abs: 1.9 10*3/uL (ref 0.7–4.0)
Monocytes Absolute: 1.9 10*3/uL — ABNORMAL HIGH (ref 0.1–1.0)
Monocytes Relative: 13 %
Neutro Abs: 10.5 10*3/uL — ABNORMAL HIGH (ref 1.7–7.7)
Neutrophils Relative %: 73 %

## 2016-01-27 LAB — TSH: TSH: 4.11 u[IU]/mL (ref 0.350–4.500)

## 2016-01-27 LAB — TROPONIN I
Troponin I: 1.66 ng/mL (ref <0.031)
Troponin I: 0.92 ng/mL (ref <0.031)
Troponin I: 0.79 ng/mL (ref <0.031)

## 2016-01-27 LAB — CBC
HCT: 47.2 % (ref 39.0–52.0)
Hemoglobin: 16.4 g/dL (ref 13.0–17.0)
MCH: 30.3 pg (ref 26.0–34.0)
MCHC: 34.7 g/dL (ref 30.0–36.0)
MCV: 87.1 fL (ref 78.0–100.0)
Platelets: 198 10*3/uL (ref 150–400)
RBC: 5.42 MIL/uL (ref 4.22–5.81)
RDW: 13.2 % (ref 11.5–15.5)
WBC: 14.3 10*3/uL — ABNORMAL HIGH (ref 4.0–10.5)

## 2016-01-27 LAB — MRSA PCR SCREENING: MRSA by PCR: NEGATIVE

## 2016-01-27 LAB — MAGNESIUM: Magnesium: 2.5 mg/dL — ABNORMAL HIGH (ref 1.7–2.4)

## 2016-01-27 LAB — GLUCOSE, CAPILLARY
Glucose-Capillary: 219 mg/dL — ABNORMAL HIGH (ref 65–99)
Glucose-Capillary: 266 mg/dL — ABNORMAL HIGH (ref 65–99)

## 2016-01-27 LAB — PROTIME-INR
INR: 1.06 (ref 0.00–1.49)
Prothrombin Time: 14 seconds (ref 11.6–15.2)

## 2016-01-27 LAB — HEPARIN LEVEL (UNFRACTIONATED): Heparin Unfractionated: 0.22 [IU]/mL — ABNORMAL LOW (ref 0.30–0.70)

## 2016-01-27 LAB — I-STAT CG4 LACTIC ACID, ED: Lactic Acid, Venous: 2.49 mmol/L (ref 0.5–2.0)

## 2016-01-27 LAB — APTT: aPTT: 25 seconds (ref 24–37)

## 2016-01-27 MED ORDER — ENSURE ENLIVE PO LIQD: 237.0000 mL | Freq: Two times a day (BID) | ORAL | Status: DC

## 2016-01-27 MED ORDER — DILTIAZEM HCL 100 MG IV SOLR
5.0000 mg/h | INTRAVENOUS | Status: DC
  Administered 2016-01-28 – 2016-01-29 (×2): 5 mg/h via INTRAVENOUS
  Filled 2016-01-27 (×2): qty 100

## 2016-01-27 MED ORDER — NITROGLYCERIN 0.4 MG SL SUBL: 0.4000 mg | SUBLINGUAL_TABLET | SUBLINGUAL | Status: DC | PRN

## 2016-01-27 MED ORDER — ACETAMINOPHEN 325 MG PO TABS: 650.0000 mg | ORAL_TABLET | ORAL | Status: DC | PRN

## 2016-01-27 MED ORDER — ASPIRIN 81 MG PO CHEW: 324.0000 mg | CHEWABLE_TABLET | ORAL | Status: AC

## 2016-01-27 MED ORDER — ONDANSETRON HCL 4 MG/2ML IJ SOLN: 4.0000 mg | Freq: Four times a day (QID) | INTRAMUSCULAR | Status: DC | PRN

## 2016-01-27 MED ORDER — MAGIC MOUTHWASH
10.0000 mL | Freq: Once | ORAL | Status: AC
  Administered 2016-01-27: 10 mL via ORAL
  Filled 2016-01-27: qty 10

## 2016-01-27 MED ORDER — INSULIN DETEMIR 100 UNIT/ML ~~LOC~~ SOLN
20.0000 [IU] | Freq: Every day | SUBCUTANEOUS | Status: DC
  Administered 2016-01-27 – 2016-01-29 (×3): 20 [IU] via SUBCUTANEOUS
  Filled 2016-01-27 (×4): qty 0.2

## 2016-01-27 MED ORDER — ASPIRIN 81 MG PO CHEW
324.0000 mg | CHEWABLE_TABLET | Freq: Once | ORAL | Status: AC
  Administered 2016-01-27: 324 mg via ORAL
  Filled 2016-01-27: qty 4

## 2016-01-27 MED ORDER — SODIUM CHLORIDE 0.9 % IV BOLUS (SEPSIS)
1000.0000 mL | Freq: Once | INTRAVENOUS | Status: AC
  Administered 2016-01-27: 1000 mL via INTRAVENOUS

## 2016-01-27 MED ORDER — DILTIAZEM LOAD VIA INFUSION
10.0000 mg | Freq: Once | INTRAVENOUS | Status: DC
  Filled 2016-01-27: qty 10

## 2016-01-27 MED ORDER — DILTIAZEM HCL 100 MG IV SOLR
5.0000 mg/h | Freq: Once | INTRAVENOUS | Status: AC
  Administered 2016-01-27: 5 mg/h via INTRAVENOUS
  Filled 2016-01-27: qty 100

## 2016-01-27 MED ORDER — DEXTROSE 5 % IV SOLN
1.0000 g | INTRAVENOUS | Status: DC
  Administered 2016-01-27 – 2016-01-30 (×3): 1 g via INTRAVENOUS
  Filled 2016-01-27 (×3): qty 10

## 2016-01-27 MED ORDER — DILTIAZEM HCL 25 MG/5ML IV SOLN
10.0000 mg | Freq: Once | INTRAVENOUS | Status: AC
Start: 2016-01-27 — End: 2016-01-27
  Administered 2016-01-27: 10 mg via INTRAVENOUS
  Filled 2016-01-27: qty 5

## 2016-01-27 MED ORDER — HEPARIN BOLUS VIA INFUSION
4000.0000 [IU] | Freq: Once | INTRAVENOUS | Status: AC
  Administered 2016-01-27: 4000 [IU] via INTRAVENOUS

## 2016-01-27 MED ORDER — ENSURE ENLIVE PO LIQD
237.0000 mL | Freq: Two times a day (BID) | ORAL | Status: DC
  Administered 2016-01-27: 237 mL via ORAL

## 2016-01-27 MED ORDER — MAGIC MOUTHWASH: 5.0000 mL | Freq: Once | ORAL | Status: DC

## 2016-01-27 MED ORDER — OFF THE BEAT BOOK
Freq: Once | Status: AC
  Administered 2016-01-27: 15:00:00
  Filled 2016-01-27: qty 1

## 2016-01-27 MED ORDER — ATORVASTATIN CALCIUM 40 MG PO TABS
40.0000 mg | ORAL_TABLET | Freq: Every day | ORAL | Status: DC
  Administered 2016-01-27 – 2016-01-29 (×3): 40 mg via ORAL
  Filled 2016-01-27 (×3): qty 1

## 2016-01-27 MED ORDER — METOPROLOL TARTRATE 25 MG PO TABS
25.0000 mg | ORAL_TABLET | Freq: Two times a day (BID) | ORAL | Status: DC
  Administered 2016-01-27 – 2016-01-30 (×7): 25 mg via ORAL
  Filled 2016-01-27 (×7): qty 1

## 2016-01-27 MED ORDER — ASPIRIN 300 MG RE SUPP: 300.0000 mg | RECTAL | Status: AC

## 2016-01-27 MED ORDER — INSULIN ASPART 100 UNIT/ML ~~LOC~~ SOLN
0.0000 [IU] | Freq: Three times a day (TID) | SUBCUTANEOUS | Status: DC
  Administered 2016-01-27: 3 [IU] via SUBCUTANEOUS
  Administered 2016-01-28: 1 [IU] via SUBCUTANEOUS
  Administered 2016-01-28: 2 [IU] via SUBCUTANEOUS
  Administered 2016-01-29: 1 [IU] via SUBCUTANEOUS
  Administered 2016-01-29: 7 [IU] via SUBCUTANEOUS
  Administered 2016-01-29: 2 [IU] via SUBCUTANEOUS
  Administered 2016-01-30: 1 [IU] via SUBCUTANEOUS

## 2016-01-27 MED ORDER — HEPARIN (PORCINE) IN NACL 100-0.45 UNIT/ML-% IJ SOLN
1150.0000 [IU]/h | INTRAMUSCULAR | Status: DC
  Administered 2016-01-27 (×2): 900 [IU]/h via INTRAVENOUS
  Filled 2016-01-27 (×3): qty 250

## 2016-01-27 MED ORDER — ASPIRIN EC 81 MG PO TBEC
81.0000 mg | DELAYED_RELEASE_TABLET | Freq: Every day | ORAL | Status: DC
  Administered 2016-01-28 – 2016-01-30 (×3): 81 mg via ORAL
  Filled 2016-01-27 (×3): qty 1

## 2016-01-27 MED ORDER — DILTIAZEM HCL 100 MG IV SOLR
5.0000 mg/h | Freq: Once | INTRAVENOUS | Status: AC
  Administered 2016-01-27: 10 mg/h via INTRAVENOUS
  Filled 2016-01-27: qty 100

## 2016-01-27 NOTE — Progress Notes (Signed)
Patient presents to clinic today c/o decreased appetite and nausea over the past week. Endorses nausea and vomiting starting last Tuesday and lasted until Thursday. Denies bloody emesis. Noted abdominal cramping but denies change in bowels. Sore throat developed during that time and has persisted. Denies fever, chills, aches. Denies recent travel or sick contact. Has lost > 10 pounds over the past week but states he has not eaten much because he has no appetite.  Is tolerating PO fluids but not solid foods due to odynophagia.  Of note, BP at 82/66 today in this patient with hypertension. Is prescribed lisinopril 10 mg, Toprol 25 mg and amlodipine 5 mg daily but states he has not taken in several days. Endorses some episodes of lightheadedness and dizziness. Denies chest pain, palpitations, shortness of breath.  Past Medical History  Diagnosis Date  . Diabetes mellitus   . History of chicken pox   . GERD (gastroesophageal reflux disease)   . Allergy   . Hyperlipidemia   . Hypertension   . Type 2 diabetes mellitus with hyperglycemia, with long-term current use of insulin (HCC) 09/08/2015    Current Outpatient Prescriptions on File Prior to Visit  Medication Sig Dispense Refill  . ACCU-CHEK AVIVA PLUS test strip     . ACCU-CHEK SOFTCLIX LANCETS lancets     . amLODipine (NORVASC) 5 MG tablet Take 1 tablet (5 mg total) by mouth daily. 30 tablet 5  . aspirin 81 MG tablet Take 81 mg by mouth daily.      Marland Kitchen atorvastatin (LIPITOR) 40 MG tablet TAKE 1 TABLET (40 MG TOTAL) BY MOUTH DAILY. 30 tablet 5  . Insulin Syringe-Needle U-100 30G X 3/8" 1 ML MISC Use to inject insulin 4 times daily. 200 each 2  . LEVEMIR 100 UNIT/ML injection INJECT 0.37 ML (37 UNITS) INTO THE SKIN AT BEDTIME 20 mL 1  . lisinopril (PRINIVIL,ZESTRIL) 10 MG tablet TAKE 1 TABLET (10 MG) BY MOUTH DAILY. 90 tablet 1  . metoprolol succinate (TOPROL-XL) 25 MG 24 hr tablet TAKE 1 TABLET (25 MG) BY MOUTH DAILY. 30 tablet 5  . NOVOLOG  100 UNIT/ML injection INJECT 22 UNITS INTO THE SKIN 3 (THREE) TIMES DAILY WITH MEALS. WITH SLIDING SCALE. **PT NEEDS FOLLOW UP APPT** 30 mL 1  . Calcium Carbonate-Vitamin D (CALTRATE 600+D) 600-400 MG-UNIT per tablet Take 1 tablet by mouth 2 (two) times daily. Reported on 01/27/2016    . metFORMIN (GLUCOPHAGE) 500 MG tablet TAKE 2 TABLETS (1,000 MG TOTAL) BY MOUTH DAILY WITH SUPPER. (Patient not taking: Reported on 01/27/2016) 120 tablet 1   No current facility-administered medications on file prior to visit.    No Known Allergies  Family History  Problem Relation Age of Onset  . Cancer Mother     cancer  . Heart disease Mother   . Hyperlipidemia Mother   . Hypertension Mother   . Hyperlipidemia Father   . Stroke Father   . Hypertension Father   . Diabetes Paternal Grandfather     Social History   Social History  . Marital Status: Married    Spouse Name: N/A  . Number of Children: 1  . Years of Education: N/A   Social History Main Topics  . Smoking status: Former Smoker    Types: Cigarettes    Quit date: 09/01/1965  . Smokeless tobacco: Never Used  . Alcohol Use: None  . Drug Use: No  . Sexual Activity: Not Asked   Other Topics Concern  . None  Social History Narrative   Regular exercise:  5 x weekly   Caffeine Use: 1 cups coffee daily.   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   Married- One son and grandson- both live with them   Wife has ovarian cancer            Review of Systems - See HPI.  All other ROS are negative.  BP 82/66 mmHg  Pulse 80  Temp(Src) 97.5 F (36.4 C) (Oral)  Resp 16  Ht 5' 9.5" (1.765 m)  Wt 159 lb 2 oz (72.179 kg)  BMI 23.17 kg/m2  SpO2 98%  Physical Exam  Constitutional: He is oriented to person, place, and time and well-developed, well-nourished, and in no distress.  HENT:  Head: Normocephalic and atraumatic.  Right Ear: External ear normal.  Left Ear: External ear normal.  Mouth/Throat: Uvula is midline. Mucous membranes are  dry.  Cardiovascular: An irregularly irregular rhythm present. Frequent extrasystoles are present. Tachycardia present.   Pulmonary/Chest: Effort normal and breath sounds normal. No respiratory distress. He has no wheezes. He has no rales. He exhibits no tenderness.  Abdominal: Soft. Normal appearance. There is no tenderness.  Neurological: He is alert and oriented to person, place, and time.  Skin: Skin is warm and dry. No rash noted.  Psychiatric: Affect normal.    Recent Results (from the past 2160 hour(s))  POCT HgB A1C     Status: Abnormal   Collection Time: 12/08/15  3:31 PM  Result Value Ref Range   Hemoglobin A1C 7.6     Assessment/Plan: 1. Irregular heart beat HR initially at 60. Irregularly irregular rhythm noted on examination. Episodic lightheadedness endorsed by patient. EKG reveals a fib with rate of 157 bpm. Some ST depression noted. ER MD consulted and patient taken directly downstairs in wheelchair with RN for emergent assessment. - EKG 12-Lead  2. Dehydration Secondary to odynophagia and recent gastroenteritis. BP hypotensive. Needs IV fluid and ER assessment giving new onset a. Fib. Patient sent to ER.  3. Acute pharyngitis, unspecified etiology Strep negative. Potentially irritative due to significant retching. Patient with diabetes. Fasting sugar this am at 106.   Piedad Climes, PA-C

## 2016-01-27 NOTE — ED Notes (Signed)
PT TO XRAY, MONITORED BY THIS RN.

## 2016-01-27 NOTE — Progress Notes (Signed)
Pt reports sore throat, pt stated they have given me magic mouthwash, it helped for a little while, but it began hurting again per pt. MD made aware. Rocephin IV 1g every 24 hours ordered per MD. Will continue to monitor pt. George Hugh RN

## 2016-01-27 NOTE — ED Notes (Signed)
Attempted to call report x 1  

## 2016-01-27 NOTE — ED Notes (Addendum)
Pt sent from [place] office upstairs ref new onset of AFib and hypotension. Pt c/o gen weakness and sore throat, denies chest pain/palpitations, SOB, dizziness. Pt reports multiple episodes of vomiting last Tuesday through Wednesday, denies diarrhea.

## 2016-01-27 NOTE — Progress Notes (Signed)
Pre visit review using our clinic review tool, if applicable. No additional management support is needed unless otherwise documented below in the visit note/SLS  

## 2016-01-27 NOTE — ED Notes (Signed)
MD at bedside. 

## 2016-01-27 NOTE — ED Provider Notes (Signed)
CSN: [id number]     Arrival date & time 01/27/16  3810 History   First MD Initiated Contact with Patient 01/27/16 [phone]     Chief Complaint  Patient presents with  . Irregular Heart Beat     (Consider location/radiation/quality/duration/timing/severity/associated sxs/prior Treatment) HPI 70 year old male who presents with irregular heartbeat. He has a history of diabetes, hypertension, and hyperlipidemia. States that 1 week ago (Tue through [place]) he had intractable nausea with nonbilious, nonbloody emesis. He has had significantly decreased by mouth intake with this. Had minimal diarrhea with intermittent abdominal cramping. Symptoms resolved now, but not eating or drinking much. Had 15-20 lb weight loss during this period of time. Reports intermittent lightheadedness.  Developed a sore throat from vomiting, and was seeing his primary care doctor today for follow-up. In the office he was noted to have a irregular heart rate at 150\-160 bpm with EKG c/f atrial fibrillation with RVR. He was also noted to be hypotensive with systolic blood pressures in the 80s to 90s. He was referred to the ED for ongoing management. He denies any fevers, recurrent vomiting or diarrhea, current abdominal pain, chest pain, difficulty breathing, syncope, lower extremity edema. Reports mild nonproductive cough, sore throat, and feeling weak.   Past Medical History  Diagnosis Date  . Diabetes mellitus   . History of chicken pox   . GERD (gastroesophageal reflux disease)   . Allergy   . Hyperlipidemia   . Hypertension   . Type 2 diabetes mellitus with hyperglycemia, with long-term current use of insulin (HCC) 09/08/2015   Past Surgical History  Procedure Laterality Date  . Shoulder surgery  1990    right shoulder, torn rotator cuff.  . Eye surgery  1998    bilateral   Family History  Problem Relation Age of Onset  . Cancer Mother     cancer  . Heart disease Mother   . Hyperlipidemia Mother   .  Hypertension Mother   . Hyperlipidemia Father   . Stroke Father   . Hypertension Father   . Diabetes Paternal Grandfather    Social History  Substance Use Topics  . Smoking status: Former Smoker    Types: Cigarettes    Quit date: 09/01/1965  . Smokeless tobacco: Never Used  . Alcohol Use: None    Review of Systems 10/14 systems reviewed and are negative other than those stated in the HPI    Allergies  Review of patient's allergies indicates no known allergies.  Home Medications   Prior to Admission medications   Medication Sig Start Date End Date Taking? Authorizing Provider  ACCU-CHEK AVIVA PLUS test strip  02/19/14   Historical Provider, MD  ACCU-CHEK SOFTCLIX LANCETS lancets  02/19/14   Historical Provider, MD  amLODipine (NORVASC) 5 MG tablet Take 1 tablet (5 mg total) by mouth daily. 09/09/15   Sandford Craze, NP  aspirin 81 MG tablet Take 81 mg by mouth daily.      Historical Provider, MD  atorvastatin (LIPITOR) 40 MG tablet TAKE 1 TABLET (40 MG TOTAL) BY MOUTH DAILY. 12/08/15   Sandford Craze, NP  Calcium Carbonate-Vitamin D (CALTRATE 600+D) 600-400 MG-UNIT per tablet Take 1 tablet by mouth 2 (two) times daily. Reported on 01/27/2016    Historical Provider, MD  Insulin Syringe-Needle U-100 30G X 3/8" 1 ML MISC Use to inject insulin 4 times daily. 03/25/15   Carlus Pavlov, MD  LEVEMIR 100 UNIT/ML injection INJECT 0.37 ML (37 UNITS) INTO THE SKIN AT BEDTIME 01/09/16   Silvestre Mesi  [name], MD  lisinopril (PRINIVIL,ZESTRIL) 10 MG tablet TAKE 1 TABLET (10 MG) BY MOUTH DAILY. 07/25/15   Sandford Craze, NP  metFORMIN (GLUCOPHAGE) 500 MG tablet TAKE 2 TABLETS (1,000 MG TOTAL) BY MOUTH DAILY WITH SUPPER. Patient not taking: Reported on 01/27/2016 01/09/16   Carlus Pavlov, MD  metoprolol succinate (TOPROL-XL) 25 MG 24 hr tablet TAKE 1 TABLET (25 MG) BY MOUTH DAILY. 07/25/15   Sandford Craze, NP  NOVOLOG 100 UNIT/ML injection INJECT 22 UNITS INTO THE SKIN 3 (THREE) TIMES  DAILY WITH MEALS. WITH SLIDING SCALE. **PT NEEDS FOLLOW UP APPT** 01/09/16   Carlus Pavlov, MD   BP 109/72 mmHg  Pulse 144  Temp(Src) 97.5 F (36.4 C) (Oral)  Resp 20  SpO2 100% Physical Exam Physical Exam  Nursing note and vitals reviewed. Constitutional: Pale, thin, non-toxic, and in no acute distress Head: Normocephalic and atraumatic.  Mouth/Throat: Erythematous posterior oropharynx. Mucous membranes appear dry. Neck: Normal range of motion. Neck supple.  Cardiovascular: Tachycardic rate and regular rhythm.   no lower extremity edema Pulmonary/Chest: Effort normal and breath sounds normal.  Abdominal: Soft. There is no tenderness. There is no rebound and no guarding.  Musculoskeletal: No deformities.  Neurological: Alert, no facial droop, fluent speech, moves all extremities symmetrically Skin: Skin is warm and dry.  Psychiatric: Cooperative  ED Course  Procedures (including critical care time) Labs Review Labs Reviewed  CBC - Abnormal; Notable for the following:    WBC 14.3 (*)    All other components within normal limits  MAGNESIUM - Abnormal; Notable for the following:    Magnesium 2.5 (*)    All other components within normal limits  TROPONIN I - Abnormal; Notable for the following:    Troponin I 1.66 (*)    All other components within normal limits  DIFFERENTIAL - Abnormal; Notable for the following:    Neutro Abs 10.5 (*)    Monocytes Absolute 1.9 (*)    All other components within normal limits  COMPREHENSIVE METABOLIC PANEL - Abnormal; Notable for the following:    Sodium 131 (*)    Chloride 95 (*)    Glucose, Bld 139 (*)    BUN 31 (*)    Total Bilirubin 4.0 (*)    GFR calc non Af Amer 60 (*)    All other components within normal limits  I-STAT CG4 LACTIC ACID, ED - Abnormal; Notable for the following:    Lactic Acid, Venous 2.49 (*)    All other components within normal limits  APTT  PROTIME-INR  TSH  LACTIC ACID, PLASMA  LACTIC ACID, PLASMA     Imaging Review Dg Chest 2 View  01/27/2016  CLINICAL DATA:  Tachycardia and irregular heart rate today. EXAM: CHEST  2 VIEW COMPARISON:  PA and lateral chest 05/07/2013 and 07/13/2011. FINDINGS: The lungs are clear. Heart size is normal. There is no pneumothorax or pleural effusion. No focal bony abnormality. IMPRESSION: No acute disease. Electronically Signed   By: Drusilla Kanner M.D.   On: 01/27/2016 09:03   I have personally reviewed and evaluated these images and lab results as part of my medical decision-making.   EKG Interpretation   Date/Time:  Tuesday January 27 2016 08:26:13 EDT Ventricular Rate:  156 PR Interval:    QRS Duration: 112 QT Interval:  347 QTC Calculation: 560 R Axis:   -92 Text Interpretation:  Atrial fibrillation with rapid V-rate LAD, consider  left anterior fascicular block Repolarization abnormality, prob rate  related No history of atrial  fibrillation. No prior EKG for comparison   Confirmed by Kendell Sagraves MD, Rakeya Glab [phone]) on 01/27/2016 8:36:04 AM     This patients CHA2DS2-VASc Score and unadjusted Ischemic Stroke Rate (% per year) is equal to 3.2 % stroke rate/year from a score of 3  Above score calculated as 1 point each if present [CHF, HTN, DM, Vascular=MI/PAD/Aortic Plaque, Age if 65-74, or Male] Above score calculated as 2 points each if present [Age > 75, or Stroke/TIA/TE]   CRITICAL CARE Performed by: Lavera Guise   Total critical care time: 35 minutes  Critical care time was exclusive of separately billable procedures and treating other patients.  Critical care was necessary to treat or prevent imminent or life-threatening deterioration.  Critical care was time spent personally by me on the following activities: development of treatment plan with patient and/or surrogate as well as nursing, discussions with consultants, evaluation of patient's response to treatment, examination of patient, obtaining history from patient or surrogate, ordering  and performing treatments and interventions, ordering and review of laboratory studies, ordering and review of radiographic studies, pulse oximetry and re-evaluation of patient's condition.   MDM   Final diagnoses:  Atrial fibrillation with RVR (HCC)  Elevated troponin   Presenting with new onset atrial fibrillation with RVR after 1 week intractable nausea and vomiting. Unclear of how long patient has been in this rhythm. Suspect that severe dehydration and electrolyte derangements may be underlying etiology of this rhythm. Looks dry on exam. EKG with atrial fibrillation with RVR and ST depressions inferolaterally that seems rate related. No major electrolyte or metabolic derangements. Mildly elevated BUN but creatinine near baseline. He does have a troponin elevation of 1.66.  he remains normotensive here in the ED. given 2 L of IV fluids. With 10 mg of IV diltiazem his heart rate does improve to the 130s to 140s. Started on a diltiazem drip. Started on heparin drip given troponin elevation and elevated chads2vasc score. Given ASA full dose given troponin elevation. No chest pain or symptoms currently. EKG not suggestive of ST elevation.  Discussed with Dr. Sharyn Lull, who will admit to cardiology for ongoing management.  Lavera Guise, MD 01/27/16 1023

## 2016-01-27 NOTE — Progress Notes (Signed)
ANTICOAGULATION CONSULT NOTE - Initial Consult  Pharmacy Consult for Heparin Indication: chest pain/ACS  No Known Allergies  Patient Measurements: TBW 72.2 kg Heparin Dosing Weight: 72.2 kg  Vital Signs: Temp: 98 F (36.7 C) (06/20 1639) Temp Source: Oral (06/20 1639) BP: 93/71 mmHg (06/20 1735) Pulse Rate: 86 (06/20 1639)  Labs:  Recent Labs  01/27/16 0820 01/27/16 1516 01/27/16 2050  HGB 16.4  --   --   HCT 47.2  --   --   PLT 198  --   --   APTT 25  --   --   LABPROT 14.0  --   --   INR 1.06  --   --   HEPARINUNFRC  --   --  0.22*  CREATININE 1.20 1.01  --   TROPONINI 1.66* 0.92*  --     Estimated Creatinine Clearance: 69 mL/min (by C-G formula based on Cr of 1.01).   Medical History: Past Medical History  Diagnosis Date  . Diabetes mellitus   . History of chicken pox   . GERD (gastroesophageal reflux disease)   . Allergy   . Hyperlipidemia   . Hypertension   . Type 2 diabetes mellitus with hyperglycemia, with long-term current use of insulin (HCC) 09/08/2015    Assessment: 70 yo M presents on 6/20 with new onset Afib and hypotension. Also complains of N/V recently. Pharmacy consulted to dose heparin. No anticoag PTA.  CBC stable.  Heparin level = 0.22, subtherapeutic on 900 units/hr, no issues with infusion per RN  Goal of Therapy:  Heparin level 0.3-0.7 units/ml Monitor platelets by anticoagulation protocol: Yes   Plan:  Increase heparin rate to 1050 units/hr F/u AM labs  Bayard Hugger, PharmD, BCPS  Clinical Pharmacist  Pager: [phone]   01/27/2016 9:24 PM

## 2016-01-27 NOTE — H&P (Signed)
Ryan [name] is an 70 y.o. male.   Chief Complaint: Nausea vomiting or appetite and irregular heartbeat HPI: Patient is 70 year old male with past medical history significant for hypertension, diabetes mellitus, hyperlipidemia, GERD, was transferred from [facility] med center for further evaluation of A. fib flutter with RVR and was noted to have elevated troponin I. Patient went to [place] office today because of nausea and vomiting for last few days associated with poor appetite and weight loss of approximately 10 pounds in last 1 week. Patient was noted to be in A. fib flutter with RVR and was referred to [facility] for further evaluation. Patient denies any chest pain. Denies any palpitation lightheadedness or syncope. Denies PND orthopnea leg swelling. Denies history of thyroid problems no history of rheumatic fever in the past. EKG done at [facility] showed atrial flutter with 2 to one block received IV Cardizem bolus and was started on drip with control of heart rate in 90s to 120s. Patient also was noted to have mildly elevated troponin I of 1.66.  Past Medical History  Diagnosis Date  . Diabetes mellitus   . History of chicken pox   . GERD (gastroesophageal reflux disease)   . Allergy   . Hyperlipidemia   . Hypertension   . Type 2 diabetes mellitus with hyperglycemia, with long-term current use of insulin ([place]) 09/08/2015    Past Surgical History  Procedure Laterality Date  . Shoulder surgery  1990    right shoulder, torn rotator cuff.  . Eye surgery  1998    bilateral    Family History  Problem Relation Age of Onset  . Cancer Mother     cancer  . Heart disease Mother   . Hyperlipidemia Mother   . Hypertension Mother   . Hyperlipidemia Father   . Stroke Father   . Hypertension Father   . Diabetes Paternal Grandfather    Social History:  reports that he quit smoking about 50 years ago. His smoking use included Cigarettes. He has never used  smokeless tobacco. He reports that he does not use illicit drugs. His alcohol history is not on file.  Allergies: No Known Allergies  Medications Prior to Admission  Medication Sig Dispense Refill  . ACCU-CHEK AVIVA PLUS test strip     . ACCU-CHEK SOFTCLIX LANCETS lancets     . amLODipine (NORVASC) 5 MG tablet Take 1 tablet (5 mg total) by mouth daily. 30 tablet 5  . aspirin 81 MG tablet Take 81 mg by mouth daily.      Marland Kitchen atorvastatin (LIPITOR) 40 MG tablet TAKE 1 TABLET (40 MG TOTAL) BY MOUTH DAILY. 30 tablet 5  . Calcium Carbonate-Vitamin D (CALTRATE 600+D) 600-400 MG-UNIT per tablet Take 1 tablet by mouth 2 (two) times daily. Reported on 01/27/2016    . Insulin Syringe-Needle U-100 30G X 3/8" 1 ML MISC Use to inject insulin 4 times daily. 200 each 2  . LEVEMIR 100 UNIT/ML injection INJECT 0.37 ML (37 UNITS) INTO THE SKIN AT BEDTIME 20 mL 1  . lisinopril (PRINIVIL,ZESTRIL) 10 MG tablet TAKE 1 TABLET (10 MG) BY MOUTH DAILY. 90 tablet 1  . metFORMIN (GLUCOPHAGE) 500 MG tablet TAKE 2 TABLETS (1,000 MG TOTAL) BY MOUTH DAILY WITH SUPPER. (Patient not taking: Reported on 01/27/2016) 120 tablet 1  . metoprolol succinate (TOPROL-XL) 25 MG 24 hr tablet TAKE 1 TABLET (25 MG) BY MOUTH DAILY. 30 tablet 5  . NOVOLOG 100 UNIT/ML injection INJECT 22 UNITS  INTO THE SKIN 3 (THREE) TIMES DAILY WITH MEALS. WITH SLIDING SCALE. **PT NEEDS FOLLOW UP APPT** 30 mL 1    Results for orders placed or performed during the hospital encounter of 01/27/16 (from the past 48 hour(s))  CBC     Status: Abnormal   Collection Time: 01/27/16  8:20 AM  Result Value Ref Range   WBC 14.3 (H) 4.0 - 10.5 K/uL   RBC 5.42 4.22 - 5.81 MIL/uL   Hemoglobin 16.4 13.0 - 17.0 g/dL   HCT 47.2 39.0 - 52.0 %   MCV 87.1 78.0 - 100.0 fL   MCH 30.3 26.0 - 34.0 pg   MCHC 34.7 30.0 - 36.0 g/dL   RDW 13.2 11.5 - 15.5 %   Platelets 198 150 - 400 K/uL  Magnesium     Status: Abnormal   Collection Time: 01/27/16  8:20 AM  Result Value Ref Range    Magnesium 2.5 (H) 1.7 - 2.4 mg/dL  Troponin I     Status: Abnormal   Collection Time: 01/27/16  8:20 AM  Result Value Ref Range   Troponin I 1.66 (HH) <0.031 ng/mL    Comment:        POSSIBLE MYOCARDIAL ISCHEMIA. SERIAL TESTING RECOMMENDED. REPEATED TO VERIFY CRITICAL RESULT CALLED TO, READ BACK BY AND VERIFIED WITH: CALLED TO M.SIMMS RN AT [phone] ON [id number] BY S.ROY   Differential     Status: Abnormal   Collection Time: 01/27/16  8:20 AM  Result Value Ref Range   Neutrophils Relative % 73 %   Neutro Abs 10.5 (H) 1.7 - 7.7 K/uL   Lymphocytes Relative 13 %   Lymphs Abs 1.9 0.7 - 4.0 K/uL   Monocytes Relative 13 %   Monocytes Absolute 1.9 (H) 0.1 - 1.0 K/uL   Eosinophils Relative 1 %   Eosinophils Absolute 0.1 0.0 - 0.7 K/uL   Basophils Relative 0 %   Basophils Absolute 0.0 0.0 - 0.1 K/uL  Comprehensive metabolic panel     Status: Abnormal   Collection Time: 01/27/16  8:20 AM  Result Value Ref Range   Sodium 131 (L) 135 - 145 mmol/L   Potassium 3.9 3.5 - 5.1 mmol/L   Chloride 95 (L) 101 - 111 mmol/L   CO2 25 22 - 32 mmol/L   Glucose, Bld 139 (H) 65 - 99 mg/dL   BUN 31 (H) 6 - 20 mg/dL   Creatinine, Ser 1.20 0.61 - 1.24 mg/dL   Calcium 9.1 8.9 - 10.3 mg/dL   Total Protein 7.7 6.5 - 8.1 g/dL   Albumin 4.0 3.5 - 5.0 g/dL   AST 39 15 - 41 U/L   ALT 54 17 - 63 U/L   Alkaline Phosphatase 91 38 - 126 U/L   Total Bilirubin 4.0 (H) 0.3 - 1.2 mg/dL   GFR calc non Af Amer 60 (L) >60 mL/min   GFR calc Af Amer >60 >60 mL/min    Comment: (NOTE) The eGFR has been calculated using the CKD EPI equation. This calculation has not been validated in all clinical situations. eGFR's persistently <60 mL/min signify possible Chronic Kidney Disease.    Anion gap 11 5 - 15  APTT     Status: None   Collection Time: 01/27/16  8:20 AM  Result Value Ref Range   aPTT 25 24 - 37 seconds  Protime-INR     Status: None   Collection Time: 01/27/16  8:20 AM  Result Value Ref Range   Prothrombin  Time 14.0 11.6 -  15.2 seconds   INR 1.06 0.00 - 1.49  I-Stat CG4 Lactic Acid, ED     Status: Abnormal   Collection Time: 01/27/16  8:41 AM  Result Value Ref Range   Lactic Acid, Venous 2.49 (HH) 0.5 - 2.0 mmol/L   Comment NOTIFIED PHYSICIAN    Dg Chest 2 View  01/27/2016  CLINICAL DATA:  Tachycardia and irregular heart rate today. EXAM: CHEST  2 VIEW COMPARISON:  PA and lateral chest 05/07/2013 and 07/13/2011. FINDINGS: The lungs are clear. Heart size is normal. There is no pneumothorax or pleural effusion. No focal bony abnormality. IMPRESSION: No acute disease. Electronically Signed   By: Inge Rise M.D.   On: 01/27/2016 09:03    Review of Systems  Constitutional: Positive for weight loss. Negative for fever and chills.  Eyes: Negative for double vision and photophobia.  Respiratory: Negative for cough, sputum production and shortness of breath.   Cardiovascular: Negative for chest pain, palpitations and leg swelling.  Gastrointestinal: Positive for nausea and vomiting. Negative for abdominal pain.  Genitourinary: Negative for dysuria.  Neurological: Negative for dizziness and headaches.    Blood pressure 102/73, pulse 111, temperature 97.6 F (36.4 C), temperature source Oral, resp. rate 21, height '5\' 9"'$  (1.753 m), weight 73.846 kg (162 lb 12.8 oz), SpO2 99 %. Physical Exam  Constitutional: He is oriented to person, place, and time.  HENT:  Head: Normocephalic and atraumatic.  Eyes: Conjunctivae are normal. Pupils are equal, round, and reactive to light. Left eye exhibits no discharge. No scleral icterus.  Neck: Normal range of motion. Neck supple. No JVD present. No tracheal deviation present. No thyromegaly present.  Cardiovascular:  Tachycardiac regularly irregular  S1 and S2 soft there is soft systolic murmur no S3 gallop  Respiratory: Effort normal and breath sounds normal. No respiratory distress. He has no wheezes. He has no rales.  GI: Soft. Bowel sounds are  normal. He exhibits no distension. There is no tenderness. There is no rebound.  Musculoskeletal: He exhibits no edema or tenderness.  Neurological: He is alert and oriented to person, place, and time.     Assessment/Plan A flutter with RVR Elevated troponin I rule out ACS Hypertension Diabetes mellitus GERD Hyperlipidemia Elevated bilirubin  Status post possible gastroenteritis Plan As per orders Check serial enzymes and EKG Discussed with patient briefly regarding TEE cardioversion if cardiac enzymes remains borderline elevated versus cardiac catheterization if further elevation of cardiac enzymes its risk and benefits. Charolette Forward, MD 01/27/2016, 1:35 PM

## 2016-01-27 NOTE — Progress Notes (Signed)
ANTICOAGULATION CONSULT NOTE - Initial Consult  Pharmacy Consult for Heparin Indication: chest pain/ACS  No Known Allergies  Patient Measurements: TBW 72.2 kg Heparin Dosing Weight: 72.2 kg  Vital Signs: Temp: 97.5 F (36.4 C) (06/20 0824) Temp Source: Oral (06/20 0824) BP: 114/93 mmHg (06/20 0930) Pulse Rate: 111 (06/20 0930)  Labs:  Recent Labs  01/27/16 0820  HGB 16.4  HCT 47.2  PLT 198  CREATININE 1.20  TROPONINI 1.66*    Estimated Creatinine Clearance: 59.1 mL/min (by C-G formula based on Cr of 1.2).   Medical History: Past Medical History  Diagnosis Date  . Diabetes mellitus   . History of chicken pox   . GERD (gastroesophageal reflux disease)   . Allergy   . Hyperlipidemia   . Hypertension   . Type 2 diabetes mellitus with hyperglycemia, with long-term current use of insulin (HCC) 09/08/2015    Assessment: 70 yo M presents on 6/20 with new onset Afib and hypotension. Also complains of N/V recently. Pharmacy consulted to dose heparin. No anticoag PTA.  CBC stable.  Goal of Therapy:  Heparin level 0.3-0.7 units/ml Monitor platelets by anticoagulation protocol: Yes   Plan:  Give heparin 4,000 unit heparin bolus Start heparin gtt 900 units/hr Check 6 hr HL Monitor daily HL, CBC, s/s of bleed  Enzo Bi, PharmD, BCPS Clinical Pharmacist Pager [phone] 01/27/2016 10:13 AM

## 2016-01-28 ENCOUNTER — Inpatient Hospital Stay (HOSPITAL_COMMUNITY): Payer: Commercial Managed Care - HMO

## 2016-01-28 LAB — CBC
HCT: 38 % — ABNORMAL LOW (ref 39.0–52.0)
Hemoglobin: 12.7 g/dL — ABNORMAL LOW (ref 13.0–17.0)
MCH: 29.2 pg (ref 26.0–34.0)
MCHC: 33.4 g/dL (ref 30.0–36.0)
MCV: 87.4 fL (ref 78.0–100.0)
Platelets: 153 10*3/uL (ref 150–400)
RBC: 4.35 MIL/uL (ref 4.22–5.81)
RDW: 13.2 % (ref 11.5–15.5)
WBC: 11.2 10*3/uL — ABNORMAL HIGH (ref 4.0–10.5)

## 2016-01-28 LAB — HEPARIN LEVEL (UNFRACTIONATED)
Heparin Unfractionated: 0.22 IU/mL — ABNORMAL LOW (ref 0.30–0.70)
Heparin Unfractionated: 0.27 IU/mL — ABNORMAL LOW (ref 0.30–0.70)
Heparin Unfractionated: 0.76 IU/mL — ABNORMAL HIGH (ref 0.30–0.70)

## 2016-01-28 LAB — HEMOGLOBIN A1C
Hgb A1c MFr Bld: 7.9 % — ABNORMAL HIGH (ref 4.8–5.6)
Mean Plasma Glucose: 180 mg/dL

## 2016-01-28 LAB — GLUCOSE, CAPILLARY
Glucose-Capillary: 130 mg/dL — ABNORMAL HIGH (ref 65–99)
Glucose-Capillary: 68 mg/dL (ref 65–99)
Glucose-Capillary: 188 mg/dL — ABNORMAL HIGH (ref 65–99)
Glucose-Capillary: 270 mg/dL — ABNORMAL HIGH (ref 65–99)

## 2016-01-28 LAB — TROPONIN I: Troponin I: 0.8 ng/mL (ref <0.031)

## 2016-01-28 MED ORDER — PANTOPRAZOLE SODIUM 40 MG PO TBEC: 40.0000 mg | DELAYED_RELEASE_TABLET | Freq: Every day | ORAL | Status: DC

## 2016-01-28 MED ORDER — PHENOL 1.4 % MT LIQD
1.0000 | OROMUCOSAL | Status: DC | PRN
  Filled 2016-01-28: qty 177

## 2016-01-28 MED ORDER — PANTOPRAZOLE SODIUM 40 MG PO TBEC
40.0000 mg | DELAYED_RELEASE_TABLET | Freq: Every day | ORAL | Status: DC
  Administered 2016-01-28 – 2016-01-30 (×3): 40 mg via ORAL
  Filled 2016-01-28 (×3): qty 1

## 2016-01-28 MED ORDER — GLUCERNA SHAKE PO LIQD
237.0000 mL | Freq: Two times a day (BID) | ORAL | Status: DC
  Administered 2016-01-28 – 2016-01-30 (×2): 237 mL via ORAL

## 2016-01-28 NOTE — Consult Note (Signed)
Ref: [name]., NP   Subjective:  Awaiting TEE with cardioversion.   Objective:  Vital Signs in the last 24 hours: Temp:  [97.6 F (36.4 C)-98.3 F (36.8 C)] 97.6 F (36.4 C) (06/21 0445) Pulse Rate:  [44-149] 104 (06/20 2155) Cardiac Rhythm:  [-] Atrial fibrillation (06/21 0811) Resp:  [18-25] 19 (06/21 0740) BP: (89-110)/(66-87) 101/67 mmHg (06/21 0740) SpO2:  [96 %-100 %] 99 % (06/20 1639) Weight:  [73.846 kg (162 lb 12.8 oz)-73.936 kg (163 lb)] 73.936 kg (163 lb) (06/21 0445)  Physical Exam: BP Readings from Last 1 Encounters:  01/28/16 101/67    Wt Readings from Last 1 Encounters:  01/28/16 73.936 kg (163 lb)    Weight change:   HEENT: [place]/AT, Eyes-PERL, EOMI, Conjunctiva-Pink, Sclera-Non-icteric Neck: No JVD, No bruit, Trachea midline. Lungs:  Clear, Bilateral. Cardiac:  Regular rhythm, atrial flutter on monitor, normal S1 and S2, no S3. II/VI systolic murmur. Abdomen:  Soft, non-tender. Extremities:  No edema present. No cyanosis. No clubbing. CNS: AxOx3, Cranial nerves grossly intact, moves all 4 extremities. Right handed. Skin: Warm and dry.   Intake/Output from previous day: 06/20 0701 - 06/21 0700 In: 3720 [P.O.:720; I.V.:3000] Out: 1450 [Urine:1450]    Lab Results: BMET    Component Value Date/Time   NA 131* 01/27/2016 1516   NA 131* 01/27/2016 0820   NA 139 09/17/2015 0933   K 4.4 01/27/2016 1516   K 3.9 01/27/2016 0820   K 4.0 09/17/2015 0933   CL 103 01/27/2016 1516   CL 95* 01/27/2016 0820   CL 102 09/17/2015 0933   CO2 19* 01/27/2016 1516   CO2 25 01/27/2016 0820   CO2 29 09/17/2015 0933   GLUCOSE 242* 01/27/2016 1516   GLUCOSE 139* 01/27/2016 0820   GLUCOSE 132* 09/17/2015 0933   BUN 21* 01/27/2016 1516   BUN 31* 01/27/2016 0820   BUN 13 09/17/2015 0933   CREATININE 1.01 01/27/2016 1516   CREATININE 1.20 01/27/2016 0820   CREATININE 1.00 09/17/2015 0933   CREATININE 0.93 01/15/2014 0843   CREATININE 0.87 10/09/2013 0849    CREATININE 0.96 05/07/2013 1007   CALCIUM 7.4* 01/27/2016 1516   CALCIUM 9.1 01/27/2016 0820   CALCIUM 10.2 09/17/2015 0933   GFRNONAA >60 01/27/2016 1516   GFRNONAA 60* 01/27/2016 0820   GFRNONAA 85 01/15/2014 0843   GFRNONAA 89 10/09/2013 0849   GFRNONAA 81 01/09/2013 0932   GFRAA >60 01/27/2016 1516   GFRAA >60 01/27/2016 0820   GFRAA >89 01/15/2014 0843   GFRAA >89 10/09/2013 0849   GFRAA >89 01/09/2013 0932   CBC    Component Value Date/Time   WBC 11.2* 01/28/2016 0310   RBC 4.35 01/28/2016 0310   HGB 12.7* 01/28/2016 0310   HCT 38.0* 01/28/2016 0310   PLT 153 01/28/2016 0310   MCV 87.4 01/28/2016 0310   MCH 29.2 01/28/2016 0310   MCHC 33.4 01/28/2016 0310   RDW 13.2 01/28/2016 0310   LYMPHSABS 1.9 01/27/2016 0820   MONOABS 1.9* 01/27/2016 0820   EOSABS 0.1 01/27/2016 0820   BASOSABS 0.0 01/27/2016 0820   HEPATIC Function Panel  Recent Labs  01/27/16 0820 01/27/16 1516  PROT 7.7 5.0*   HEMOGLOBIN A1C No components found for: HGA1C,  MPG CARDIAC ENZYMES Lab Results  Component Value Date   TROPONINI 0.80* 01/28/2016   TROPONINI 0.79* 01/27/2016   TROPONINI 0.92* 01/27/2016   BNP No results for input(s): PROBNP in the last 8760 hours. TSH  Recent Labs  01/27/16 0905  TSH 4.110   CHOLESTEROL  Recent Labs  02/04/15 0801 09/09/15 1122 09/17/15 0933  CHOL 162 181 147    Scheduled Meds: . aspirin EC  81 mg Oral Daily  . atorvastatin  40 mg Oral q1800  . cefTRIAXone (ROCEPHIN)  IV  1 g Intravenous Q24H  . diltiazem  10 mg Intravenous Once  . feeding supplement (ENSURE ENLIVE)  237 mL Oral BID BM  . insulin aspart  0-9 Units Subcutaneous TID WC  . insulin detemir  20 Units Subcutaneous QHS  . metoprolol tartrate  25 mg Oral BID  . pantoprazole  40 mg Oral Daily   Continuous Infusions: . diltiazem (CARDIZEM) infusion 5 mg/hr (01/28/16 0809)  . heparin 1,200 Units/hr (01/28/16 0337)   PRN Meds:.acetaminophen, nitroGLYCERIN, ondansetron  (ZOFRAN) IV  Assessment/Plan: Atrial flutter with controlled ventricular response now. Probable small non-Q-wave myocardial infarction versus demand ischemia secondary to above Hypertension Diabetes mellitus GERD Hyperlipidemia Status post gastroenteritis URI  TEE in AM due to scheduling difficulty today.     LOS: 1 day    Orpah Cobb  MD  01/28/2016, 9:51 AM

## 2016-01-28 NOTE — Progress Notes (Signed)
  Echocardiogram 2D Echocardiogram has been performed.  Arvil Chaco 01/28/2016, 4:33 PM

## 2016-01-28 NOTE — Progress Notes (Signed)
Initial Nutrition Assessment  DOCUMENTATION CODES:   Not applicable  INTERVENTION:    Glucerna Shake PO BID, each supplement provides 220 kcal and 10 grams of protein  NUTRITION DIAGNOSIS:   Inadequate oral intake related to acute illness, other (see comment) (sore throat) as evidenced by per patient/family report.  GOAL:   Patient will meet greater than or equal to 90% of their needs  MONITOR:   PO intake, Supplement acceptance, Weight trends, Labs  REASON FOR ASSESSMENT:   Malnutrition Screening Tool    ASSESSMENT:   70 year old male with past medical history significant for hypertension, diabetes mellitus, hyperlipidemia, GERD, was transferred from [facility] med center for further evaluation of A. fib flutter with RVR and was noted to have elevated troponin I. Patient went to [place] office today because of nausea and vomiting for last few days associated with poor appetite and weight loss of approximately 10 pounds in last 1 week.  Labs reviewed: sodium low CBG's: 266-130-68 Patient reports that he has been eating poorly for the past 4-5 days due to nausea and vomiting. Suspect most of weight loss related to dehydration on admission.  Patient now with sore throat and having trouble swallowing some foods. He agreed to try Glucerna Shakes between meals to maximize oral intake.  Diet Order:  Diet NPO time specified Diet heart healthy/carb modified Room service appropriate?: Yes; Fluid consistency:: Thin  Skin:  Reviewed, no issues  Last BM:  6/19  Height:   Ht Readings from Last 1 Encounters:  01/27/16 '5\' 9"'$  (1.753 m)    Weight:   Wt Readings from Last 1 Encounters:  01/28/16 163 lb (73.936 kg)    Ideal Body Weight:  72.7 kg  BMI:  Body mass index is 24.06 kg/(m^2).  Estimated Nutritional Needs:   Kcal:  1800-2000  Protein:  90-100 gm  Fluid:  1.8-2 L  EDUCATION NEEDS:   No education needs identified at this time  Molli Barrows, [place], [place],  [place] Pager [phone] After Hours Pager [phone]

## 2016-01-28 NOTE — Progress Notes (Signed)
ANTICOAGULATION CONSULT NOTE - Follow Up Consult  Pharmacy Consult for heparin Indication: chest pain/ACS and atrial fibrillation  Labs:  Recent Labs  01/27/16 0820 01/27/16 1516 01/27/16 2050 01/28/16 0310  HGB 16.4  --   --  12.7*  HCT 47.2  --   --  38.0*  PLT 198  --   --  153  APTT 25  --   --   --   LABPROT 14.0  --   --   --   INR 1.06  --   --   --   HEPARINUNFRC  --   --  0.22* 0.22*  CREATININE 1.20 1.01  --   --   TROPONINI 1.66* 0.92* 0.79*  --     Assessment: 70yo male remains subtherapeutic on heparin with no change of level after rate increase, no gtt issues overnight per RN.  Goal of Therapy:  Heparin level 0.3-0.7 units/ml   Plan:  Will increase heparin gtt by 2 units/kg/hr and check level in 6hr.  Vernard Gambles, PharmD, BCPS  01/28/2016,3:33 AM

## 2016-01-28 NOTE — Progress Notes (Signed)
ANTICOAGULATION CONSULT NOTE  Pharmacy Consult for Heparin Indication: chest pain/ACS  No Known Allergies  Patient Measurements: TBW 72.2 kg Heparin Dosing Weight: 72.2 kg  Vital Signs: Temp: 97.6 F (36.4 C) (06/21 0445) Temp Source: Oral (06/21 0445) BP: 101/67 mmHg (06/21 0740)  Labs:  Recent Labs  01/27/16 0820 01/27/16 1516 01/27/16 2050 01/28/16 0310 01/28/16 1006  HGB 16.4  --   --  12.7*  --   HCT 47.2  --   --  38.0*  --   PLT 198  --   --  153  --   APTT 25  --   --   --   --   LABPROT 14.0  --   --   --   --   INR 1.06  --   --   --   --   HEPARINUNFRC  --   --  0.22* 0.22* 0.27*  CREATININE 1.20 1.01  --   --   --   TROPONINI 1.66* 0.92* 0.79* 0.80*  --     Estimated Creatinine Clearance: 69 mL/min (by C-G formula based on Cr of 1.01).   Medical History: Past Medical History  Diagnosis Date  . Diabetes mellitus   . History of chicken pox   . GERD (gastroesophageal reflux disease)   . Allergy   . Hyperlipidemia   . Hypertension   . Type 2 diabetes mellitus with hyperglycemia, with long-term current use of insulin ([facility]) 09/08/2015    Assessment: 70 yo M presents on 6/20 with new onset Afib and hypotension. Also complains of N/V recently. Pharmacy consulted to dose heparin. No anticoag PTA. No bleeding noted. Awaiting TEE with cardioversion, plan for tomorrow.   Heparin level = 0.27, subtherapeutic on 1200 units/hr, no issues with infusion per RN  Goal of Therapy:  Heparin level 0.3-0.7 units/ml Monitor platelets by anticoagulation protocol: Yes   Plan:  Increase heparin rate to 1350 units/hr Check anti-Xa level in 6 hours and daily while on heparin Continue to monitor H&H and platelets   Thank you for allowing [place] to participate in this patients care. Signe Colt, PharmD Pager: [phone] 01/28/2016 11:52 AM

## 2016-01-28 NOTE — Progress Notes (Signed)
Pt reports indigestion. MD made aware, protonix 40mg  po now and daily ordered per MD. Will continue to monitor pt. George Hugh RN

## 2016-01-28 NOTE — Progress Notes (Signed)
ANTICOAGULATION CONSULT NOTE  Pharmacy Consult for Heparin Indication: chest pain/ACS  No Known Allergies  Patient Measurements: TBW 72.2 kg Heparin Dosing Weight: 72.2 kg  Vital Signs: Temp: 98 F (36.7 C) (06/21 1353) Temp Source: Oral (06/21 1353) BP: 127/97 mmHg (06/21 1645) Pulse Rate: 102 (06/21 1353)  Labs:  Recent Labs  01/27/16 0820 01/27/16 1516  01/27/16 2050 01/28/16 0310 01/28/16 1006 01/28/16 1801  HGB 16.4  --   --   --  12.7*  --   --   HCT 47.2  --   --   --  38.0*  --   --   PLT 198  --   --   --  153  --   --   APTT 25  --   --   --   --   --   --   LABPROT 14.0  --   --   --   --   --   --   INR 1.06  --   --   --   --   --   --   HEPARINUNFRC  --   --   < > 0.22* 0.22* 0.27* 0.76*  CREATININE 1.20 1.01  --   --   --   --   --   TROPONINI 1.66* 0.92*  --  0.79* 0.80*  --   --   < > = values in this interval not displayed.  Estimated Creatinine Clearance: 69 mL/min (by C-G formula based on Cr of 1.01).   Medical History: Past Medical History  Diagnosis Date  . Diabetes mellitus   . History of chicken pox   . GERD (gastroesophageal reflux disease)   . Allergy   . Hyperlipidemia   . Hypertension   . Type 2 diabetes mellitus with hyperglycemia, with long-term current use of insulin (HCC) 09/08/2015    Assessment: 70 yo M presents on 6/20 with new onset Afib and hypotension. Also complains of N/V recently. Pharmacy consulted to dose heparin. No anticoag PTA. No bleeding noted. Awaiting TEE with cardioversion, plan for tomorrow.   PM Heparin level = 0.76  Goal of Therapy:  Heparin level 0.3-0.7 units/ml Monitor platelets by anticoagulation protocol: Yes   Plan:  Decrease heparin to 1300 units / hr Follow up AM labs  Thank you Okey Regal, PharmD [phone] I6/21/2017 7:29 PM

## 2016-01-28 NOTE — Progress Notes (Signed)
Subjective:  Denies any chest pain or shortness of breath. States sore throat slightly  better after starting Rocephin. Remains in atrial flutter with controlled ventricular response troponin I trending down  Objective:  Vital Signs in the last 24 hours: Temp:  [97.5 F (36.4 C)-98.3 F (36.8 C)] 97.6 F (36.4 C) (06/21 0445) Pulse Rate:  [44-158] 104 (06/20 2155) Resp:  [18-25] 22 (06/20 1735) BP: (89-116)/(66-94) 98/66 mmHg (06/20 2155) SpO2:  [96 %-100 %] 99 % (06/20 1639) Weight:  [73.846 kg (162 lb 12.8 oz)-73.936 kg (163 lb)] 73.936 kg (163 lb) (06/21 0445)  Intake/Output from previous day: 06/20 0701 - 06/21 0700 In: 3720 [P.O.:720; I.V.:3000] Out: 1450 [Urine:1450] Intake/Output from this shift: Total I/O In: -  Out: 500 [Urine:500]  Physical Exam: Neck: no adenopathy, no carotid bruit, no JVD and supple, symmetrical, trachea midline Lungs: clear to auscultation bilaterally Heart: regularly irregular rhythm, S1, S2 normal and Soft systolic murmur noted Abdomen: soft, non-tender; bowel sounds normal; no masses,  no organomegaly Extremities: extremities normal, atraumatic, no cyanosis or edema  Lab Results:  Recent Labs  01/27/16 0820 01/28/16 0310  WBC 14.3* 11.2*  HGB 16.4 12.7*  PLT 198 153    Recent Labs  01/27/16 0820 01/27/16 1516  NA 131* 131*  K 3.9 4.4  CL 95* 103  CO2 25 19*  GLUCOSE 139* 242*  BUN 31* 21*  CREATININE 1.20 1.01    Recent Labs  01/27/16 2050 01/28/16 0310  TROPONINI 0.79* 0.80*   Hepatic Function Panel  Recent Labs  01/27/16 1516  PROT 5.0*  ALBUMIN 2.6*  AST 23  ALT 39  ALKPHOS 61  BILITOT 2.8*   No results for input(s): CHOL in the last 72 hours. No results for input(s): PROTIME in the last 72 hours.  Imaging: Imaging results have been reviewed and Dg Chest 2 View  01/27/2016  CLINICAL DATA:  Tachycardia and irregular heart rate today. EXAM: CHEST  2 VIEW COMPARISON:  PA and lateral chest 05/07/2013 and  07/13/2011. FINDINGS: The lungs are clear. Heart size is normal. There is no pneumothorax or pleural effusion. No focal bony abnormality. IMPRESSION: No acute disease. Electronically Signed   By: Drusilla Kanner M.D.   On: 01/27/2016 09:03    Cardiac Studies:  Assessment/Plan:  Atrial flutter with controlled ventricular response now. Probable small non-Q-wave myocardial infarction versus demand ischemia secondary to above Hypertension Diabetes mellitus GERD Hyperlipidemia Status post gastroenteritis URI Plan Schedule for TEE today Discussed with patient regarding cardiac catheterization its risk and benefits versus medical management, once to try medical management for now  LOS: 1 day    Rinaldo Cloud 01/28/2016, 7:59 AM

## 2016-01-28 NOTE — Care Management Important Message (Signed)
Important Message  Patient Details  Name: DARRYL ARNHOLD MRN: [id number] Date of Birth: 1945/08/18   Medicare Important Message Given:  Yes    Bernadette Hoit 01/28/2016, 1:47 PM

## 2016-01-28 NOTE — Progress Notes (Signed)
RN encouraged pt to increase activity today. Pt was able to bathe. Pt states throat is still sore, chloraseptic spray ordered. Cecille Rubin, RN

## 2016-01-29 ENCOUNTER — Encounter (HOSPITAL_COMMUNITY)
Admission: EM | Disposition: A | Discharge status: Home / Self Care | Payer: Self-pay | Source: Home / Self Care | Attending: Cardiology

## 2016-01-29 ENCOUNTER — Other Ambulatory Visit (HOSPITAL_COMMUNITY): Payer: Commercial Managed Care - HMO

## 2016-01-29 LAB — CBC
HCT: 36.9 % — ABNORMAL LOW (ref 39.0–52.0)
Hemoglobin: 12.1 g/dL — ABNORMAL LOW (ref 13.0–17.0)
MCH: 28.7 pg (ref 26.0–34.0)
MCHC: 32.8 g/dL (ref 30.0–36.0)
MCV: 87.4 fL (ref 78.0–100.0)
Platelets: 160 10*3/uL (ref 150–400)
RBC: 4.22 MIL/uL (ref 4.22–5.81)
RDW: 13.2 % (ref 11.5–15.5)
WBC: 9.5 10*3/uL (ref 4.0–10.5)

## 2016-01-29 LAB — HEPARIN LEVEL (UNFRACTIONATED): Heparin Unfractionated: 1 IU/mL — ABNORMAL HIGH (ref 0.30–0.70)

## 2016-01-29 LAB — GLUCOSE, CAPILLARY
Glucose-Capillary: 138 mg/dL — ABNORMAL HIGH (ref 65–99)
Glucose-Capillary: 174 mg/dL — ABNORMAL HIGH (ref 65–99)
Glucose-Capillary: 307 mg/dL — ABNORMAL HIGH (ref 65–99)
Glucose-Capillary: 194 mg/dL — ABNORMAL HIGH (ref 65–99)

## 2016-01-29 SURGERY — ECHOCARDIOGRAM, TRANSESOPHAGEAL
Anesthesia: Monitor Anesthesia Care

## 2016-01-29 MED ORDER — AMIODARONE HCL 200 MG PO TABS
400.0000 mg | ORAL_TABLET | Freq: Two times a day (BID) | ORAL | Status: DC
  Administered 2016-01-29 – 2016-01-30 (×3): 400 mg via ORAL
  Filled 2016-01-29 (×3): qty 2

## 2016-01-29 MED ORDER — APIXABAN 5 MG PO TABS
5.0000 mg | ORAL_TABLET | Freq: Two times a day (BID) | ORAL | Status: DC
  Administered 2016-01-29 – 2016-01-30 (×3): 5 mg via ORAL
  Filled 2016-01-29 (×3): qty 1

## 2016-01-29 NOTE — Progress Notes (Signed)
ANTICOAGULATION CONSULT NOTE - Follow Up Consult  Pharmacy Consult for Heparin  Indication: chest pain/ACS, Afib  No Known Allergies  Patient Measurements: Height: 5\' 9"  (175.3 cm) Weight: 163 lb (73.936 kg) IBW/kg (Calculated) : 70.7  Vital Signs: Temp: 97.8 F (36.6 C) (06/21 2108) Temp Source: Oral (06/21 2108)  Labs:  Recent Labs  01/27/16 0820 01/27/16 1516  01/27/16 2050 01/28/16 0310 01/28/16 1006 01/28/16 1801 01/29/16 0355  HGB 16.4  --   --   --  12.7*  --   --  12.1*  HCT 47.2  --   --   --  38.0*  --   --  36.9*  PLT 198  --   --   --  153  --   --  160  APTT 25  --   --   --   --   --   --   --   LABPROT 14.0  --   --   --   --   --   --   --   INR 1.06  --   --   --   --   --   --   --   HEPARINUNFRC  --   --   < > 0.22* 0.22* 0.27* 0.76* 1.00*  CREATININE 1.20 1.01  --   --   --   --   --   --   TROPONINI 1.66* 0.92*  --  0.79* 0.80*  --   --   --   < > = values in this interval not displayed.  Estimated Creatinine Clearance: 69 mL/min (by C-G formula based on Cr of 1.01).  Assessment: On heparin for afib, plans for TEE/cardioversion, HL is high this AM, no issues per RN.  Goal of Therapy:  Heparin level 0.3-0.7 units/ml Monitor platelets by anticoagulation protocol: Yes   Plan:  -Decrease heparin to 1150 units/hr -1400 HL  Abran Duke 01/29/2016,5:24 AM

## 2016-01-29 NOTE — Progress Notes (Signed)
Subjective:  Doing well denies any chest pain or shortness of breath converted spontaneously to sinus rhythm. Discussed with patient at length regarding various options of treatment i.e. flutter ablation and also cardiac catheterization wanted to be treated medically. States sore throat has gradually improved.  Objective:  Vital Signs in the last 24 hours: Temp:  [97.8 F (36.6 C)-98 F (36.7 C)] 97.9 F (36.6 C) (06/22 0620) Pulse Rate:  [102] 102 (06/21 1353) Resp:  [14-25] 21 (06/22 0820) BP: (108-127)/(65-97) 110/65 mmHg (06/22 0820) SpO2:  [99 %] 99 % (06/21 1353) Weight:  [73.074 kg (161 lb 1.6 oz)] 73.074 kg (161 lb 1.6 oz) (06/22 0620)  Intake/Output from previous day: 06/21 0701 - 06/22 0700 In: 960 [P.O.:960] Out: 900 [Urine:900] Intake/Output from this shift:    Physical Exam: Neck: no adenopathy, no carotid bruit, no JVD and supple, symmetrical, trachea midline Lungs: clear to auscultation bilaterally Heart: regular rate and rhythm, S1, S2 normal and Soft systolic murmur noted Abdomen: soft, non-tender; bowel sounds normal; no masses,  no organomegaly Extremities: extremities normal, atraumatic, no cyanosis or edema  Lab Results:  Recent Labs  01/28/16 0310 01/29/16 0355  WBC 11.2* 9.5  HGB 12.7* 12.1*  PLT 153 160    Recent Labs  01/27/16 0820 01/27/16 1516  NA 131* 131*  K 3.9 4.4  CL 95* 103  CO2 25 19*  GLUCOSE 139* 242*  BUN 31* 21*  CREATININE 1.20 1.01    Recent Labs  01/27/16 2050 01/28/16 0310  TROPONINI 0.79* 0.80*   Hepatic Function Panel  Recent Labs  01/27/16 1516  PROT 5.0*  ALBUMIN 2.6*  AST 23  ALT 39  ALKPHOS 61  BILITOT 2.8*   No results for input(s): CHOL in the last 72 hours. No results for input(s): PROTIME in the last 72 hours.  Imaging: Imaging results have been reviewed and No results found.  Cardiac Studies:  Assessment/Plan:  Atrial flutter with controlled ventricular response now. Probable small  non-Q-wave myocardial infarction versus demand ischemia secondary to above Hypertension Diabetes mellitus GERD Hyperlipidemia Status post gastroenteritis URI Plan DC IV Cardizem and heparin Start amiodarone 400 mg twice daily Start Eliquis 5 mg twice daily Check labs in a.m. Possible discharge tomorrow if remains in sinus rhythm and stable  LOS: 2 days    Charolette Forward 01/29/2016, 9:57 AM

## 2016-01-29 NOTE — Progress Notes (Signed)
ANTICOAGULATION CONSULT NOTE - Initial Consult  Pharmacy Consult for Eliquis Indication: atrial fibrillation  No Known Allergies  Patient Measurements: Height: 5\' 9"  (175.3 cm) Weight: 161 lb 1.6 oz (73.074 kg) IBW/kg (Calculated) : 70.7  Vital Signs: Temp: 97.9 F (36.6 C) (06/22 0620) Temp Source: Oral (06/22 0620) BP: 110/65 mmHg (06/22 0820)  Labs:  Recent Labs  01/27/16 0820 01/27/16 1516  01/27/16 2050 01/28/16 0310 01/28/16 1006 01/28/16 1801 01/29/16 0355  HGB 16.4  --   --   --  12.7*  --   --  12.1*  HCT 47.2  --   --   --  38.0*  --   --  36.9*  PLT 198  --   --   --  153  --   --  160  APTT 25  --   --   --   --   --   --   --   LABPROT 14.0  --   --   --   --   --   --   --   INR 1.06  --   --   --   --   --   --   --   HEPARINUNFRC  --   --   < > 0.22* 0.22* 0.27* 0.76* 1.00*  CREATININE 1.20 1.01  --   --   --   --   --   --   TROPONINI 1.66* 0.92*  --  0.79* 0.80*  --   --   --   < > = values in this interval not displayed.  Estimated Creatinine Clearance: 69 mL/min (by C-G formula based on Cr of 1.01).   Medical History: Past Medical History  Diagnosis Date  . Diabetes mellitus   . History of chicken pox   . GERD (gastroesophageal reflux disease)   . Allergy   . Hyperlipidemia   . Hypertension   . Type 2 diabetes mellitus with hyperglycemia, with long-term current use of insulin (HCC) 09/08/2015    Assessment: AC: heparin for AFib. No anticoag PTA. Troponin elevated Hep Level 1 elevated, CBC stable today, Start Eliquis (69 y/o, 73 kg, CrCl 69, Scr 1.01)   Goal of Therapy:  Therapeutic oral anticoagulation Monitor platelets by anticoagulation protocol: Yes   Plan:  D/c IV heparin Eliquis 5mg  BID   Gurtha Picker S. Merilynn Finland, PharmD, BCPS Clinical Staff Pharmacist Pager [phone]  Misty Stanley Stillinger 01/29/2016,10:15 AM

## 2016-01-29 NOTE — Progress Notes (Signed)
Pt converted from AFIB to SR with PAC's around 0420 am per telemetry technician. MD paged, awaiting return call. EKG completed, SR with PAC'S per EKG. Pt stated I feel just fine, no complaints at this time. Will continue to monitor pt. George Hugh RN

## 2016-01-29 NOTE — Care Management Note (Addendum)
Case Management Note  Patient Details  Name: Ryan [name] MRN: [id number] Date of Birth: [birth year]  Subjective/Objective: Pt in for A Fib RVR- Plan for home on Eliquis once stable.                  Action/Plan: Benefits check in process and will make pt aware of cost once completed. 30 day free card will be given prior to d/c. No further needs from CM @ this time.    Expected Discharge Date:                  Expected Discharge Plan:  Home/Self Care  In-House Referral:  NA  Discharge planning Services  CM Consult, Medication Assistance  Post Acute Care Choice:  NA Choice offered to:  NA  DME Arranged:  N/A DME Agency:  NA  HH Arranged:  NA HH Agency:  NA  Status of Service:  Completed, signed off  If discussed at Long Length of Stay Meetings, dates discussed:    Additional Comments: 1522 01-29-16 Tomi Bamberger, RN,BSN [id number] S/W LINDA @ [employer] [facility] # [phone]   ELIQUIS 2.5 MG AND 5 MG BID ( 30 )   COVER- YES         SAME  CO-PAY- $155.27      SAME  TIER- 3 DRUG        SAME  PRIOR APPROVAL- NO- NO  PHARMACY: [facility] AND [facility]   [employer] M/O-90 DY SUPPLY $ 131.00-   Pt is aware of 30 and 90 day cost. CM did make him aware he can switch Part D plans in October if that would help. Per pt he is paying over $500.00 for insulin. Pt states he will be able to get medication, however it will be hard. No further needs from CM at this time.   Gala Lewandowsky, RN 01/29/2016, 10:49 AM

## 2016-01-30 ENCOUNTER — Telehealth: Payer: Self-pay | Admitting: Family

## 2016-01-30 DIAGNOSIS — I214 Non-ST elevation (NSTEMI) myocardial infarction: Secondary | ICD-10-CM

## 2016-01-30 LAB — COMPREHENSIVE METABOLIC PANEL
ALT: 25 U/L (ref 17–63)
AST: 18 U/L (ref 15–41)
Albumin: 2.5 g/dL — ABNORMAL LOW (ref 3.5–5.0)
Alkaline Phosphatase: 58 U/L (ref 38–126)
Anion gap: 4 — ABNORMAL LOW (ref 5–15)
BUN: 9 mg/dL (ref 6–20)
CO2: 27 mmol/L (ref 22–32)
Calcium: 8.2 mg/dL — ABNORMAL LOW (ref 8.9–10.3)
Chloride: 105 mmol/L (ref 101–111)
Creatinine, Ser: 0.85 mg/dL (ref 0.61–1.24)
GFR calc Af Amer: >60 mL/min (ref >60)
GFR calc non Af Amer: >60 mL/min (ref >60)
Glucose, Bld: 118 mg/dL — ABNORMAL HIGH (ref 65–99)
Potassium: 3.4 mmol/L — ABNORMAL LOW (ref 3.5–5.1)
Sodium: 136 mmol/L (ref 135–145)
Total Bilirubin: 1 mg/dL (ref 0.3–1.2)
Total Protein: 5 g/dL — ABNORMAL LOW (ref 6.5–8.1)

## 2016-01-30 LAB — CBC
HCT: 34.7 % — ABNORMAL LOW (ref 39.0–52.0)
Hemoglobin: 11.5 g/dL — ABNORMAL LOW (ref 13.0–17.0)
MCH: 28.9 pg (ref 26.0–34.0)
MCHC: 33.1 g/dL (ref 30.0–36.0)
MCV: 87.2 fL (ref 78.0–100.0)
Platelets: 171 10*3/uL (ref 150–400)
RBC: 3.98 MIL/uL — ABNORMAL LOW (ref 4.22–5.81)
RDW: 13.3 % (ref 11.5–15.5)
WBC: 7.3 10*3/uL (ref 4.0–10.5)

## 2016-01-30 LAB — GLUCOSE, CAPILLARY
Glucose-Capillary: 67 mg/dL (ref 65–99)
Glucose-Capillary: 128 mg/dL — ABNORMAL HIGH (ref 65–99)

## 2016-01-30 LAB — TSH: TSH: 2.335 u[IU]/mL (ref 0.350–4.500)

## 2016-01-30 MED ORDER — APIXABAN 5 MG PO TABS: 5.0000 mg | ORAL_TABLET | Freq: Two times a day (BID) | ORAL | Status: DC

## 2016-01-30 MED ORDER — AMIODARONE HCL 400 MG PO TABS: 200.0000 mg | ORAL_TABLET | Freq: Two times a day (BID) | ORAL | Status: DC

## 2016-01-30 MED FILL — AMIODARONE HCL 200 MG TABLE: 200 | 30 days supply | Qty: 60 | Fill #0

## 2016-01-30 MED FILL — ELIQUIS 5 MG TABLET: 5 | 30 days supply | Qty: 60 | Fill #0

## 2016-01-30 NOTE — Care Management Important Message (Signed)
Important Message  Patient Details  Name: Ryan [name] MRN: [id number] Date of Birth: May 22, 1946   Medicare Important Message Given:  Yes    Kyla Balzarine 01/30/2016, 10:38 AM

## 2016-01-30 NOTE — Telephone Encounter (Signed)
Please contact pt to arrange TCM follow up.  

## 2016-01-30 NOTE — Discharge Summary (Signed)
Discharge summary dictated on 01/30/2016, dictation number is [phone]

## 2016-01-30 NOTE — Telephone Encounter (Signed)
Will contact pt on Monday 6/26 for TCM. As per chart review, pt is currently still admitted.

## 2016-01-30 NOTE — Discharge Summary (Signed)
NAMETYQUELL, STUCKEY NO.:  [id number]  MEDICAL RECORD NO.:  [id number]  LOCATION:  3W03C                        FACILITY:  [facility]  PHYSICIAN:  Allegra Lai. Terrence Dupont, M.D. DATE OF BIRTH:  [birth year]  DATE OF ADMISSION:  01/27/2016 DATE OF DISCHARGE:  01/30/2016                              DISCHARGE SUMMARY   ADMITTING DIAGNOSES: 1. Aflutter with rapid ventricular response. 2. Elevated troponin-I, rule out acute coronary syndrome. 3. Hypertension. 4. Diabetes mellitus. 5. Gastroesophageal reflux disease. 6. Hyperlipidemia. 7. Elevated bilirubin. 8. Status post possible gastroenteritis.  DISCHARGE DIAGNOSES: 1. Status post aflutter with rapid ventricular response. 2. Probable small non-Q-wave myocardial infarction due to elevated     troponin-I versus demand ischemia secondary to atrial flutter with     rapid ventricular response. 3. Hypertension. 4. Diabetes mellitus. 5. Gastroesophageal reflux disease. 6. Hyperlipidemia. 7. Status post gastroenteritis. 8. Resolving URI. 9. Status post hyperbilirubinemia.  DISCHARGE HOME MEDICATIONS: 1. Amiodarone 200 mg twice daily. 2. Eliquis 5 mg one tablet twice daily. 3. Aspirin 81 mg one tablet daily. 4. Atorvastatin 40 mg one tablet daily. 5. Caltrate 600 mg with vitamin D one tablet daily as before. 6. Levemir insulin 37 units at bedtime as before. 7. Lisinopril 10 mg one tablet daily. 8. Metoprolol succinate 25 mg one tablet daily. 9. NovoLog sliding scale as before. The patient has been advised to stop amlodipine.  DIET:  Low salt, low cholesterol 1800 calories ADA diet.  Monitor blood pressure and blood sugar daily and chart.  FOLLOWUP:  Follow up with me in one week.  Follow up with PMD as scheduled.  CONDITION AT DISCHARGE:  Stable.  BRIEF HISTORY:  Mr. Stokes is a 70 year old male with past medical history significant for hypertension, diabetes mellitus, hyperlipidemia, and GERD, was transferred from  [facility] for further evaluation of AFib flutter with rapid ventricular response and was noted to have elevated troponin-I.  The patient went to PMD's office today because of nausea and vomiting for last few days associated with poor appetite, weight loss of approximately 10 pounds over the period of one week.  The patient was noted to be in AFib flutter with rapid ventricular response and was referred to [facility] for further evaluation.  The patient denies any chest pain, denies any palpitation, lightheadedness, or syncope.  The patient denies PND, orthopnea, or leg swelling.  The patient denies history of thyroid problems.  No history of rheumatic fever in the past.  E[facility] done at [facility] showed atrial flutter with 2 to 1 block, received IV Cardizem bolus and was started on drip with control of heart rate in 90s to 120.  The patient also was noted to have mildly elevated troponin-I of 1.66.  PHYSICAL EXAMINATION:  GENERAL:  On examination, he was alert, awake, oriented x3, in no acute distress. VITAL SIGNS:  Blood pressure was 102/73, pulse 111.  He was afebrile. HEENT:  Conjunctivae were pink. NECK:  Supple.  No JVD.  No bruit. LUNGS:  Clear to auscultation without rhonchi or rales. CARDIOVASCULAR:  Tachycardic.  Irregularly regular.  S1, S2 was soft. There was soft systolic murmur.  No S3, gallop. ABDOMEN:  Soft.  Bowel sounds are present.  Nontender. EXTREMITIES:  There is no clubbing, cyanosis, or edema.  LABORATORY DATA:  Sodium was 131, potassium 3.9, BUN 31, creatinine 1.20.  His total protein was 7.7.  Total bilirubin was 4.0 which has normalized to 1.0 today.  His troponin-I was 1.66.  Repeat troponin-I was trending down 0.9-0.79.  His hemoglobin was 16.4, hematocrit 47.2, white count of 14.3 with no shift to the left.  His last labs; sodium 136, potassium 3.4, BUN 9, creatinine 0.85.  His total protein was 5.0. Bilirubin is back to  normal 1.0.  Last hemoglobin 11.5, hematocrit 34.7, white count of 7.3.  TSH is 2.33.  Last EKG showed normal sinus rhythm with atrial premature contractions. Atrial flutter is resolved.  BRIEF HOSPITAL COURSE:  The patient was admitted to step-down unit, was started on IV Cardizem and continued on p.o. Lopressor and was also started on IV heparin.  The patient spontaneously converted back into sinus rhythm.  The patient initially was scheduled for TEE assisted cardioversion; but as the patient spontaneously converted to sinus rhythm, the TEE was canceled.  The patient's IV Cardizem was weaned off and was started on p.o. amiodarone which he is tolerating it well.  The patient has remained in sinus rhythm.  His troponin-I were mildly elevated which were felt due to demand ischemia.  I discussed with the patient at length various options of treatment, i.e., flutter ablation and also cardiac catheterization, possible PCI.  The patient opted for medical management.  The patient did not have any episodes of chest pain during the hospital stay.  The patient also had significant sore throat, was started on IV Rocephin with improvement in his symptoms.  The patient remained afebrile during his hospital stay.  The patient will be discharged home on above medications.  IV heparin was switched to p.o. Eliquis which he is tolerating it well.  The patient will be discharged home on above medications.  We will adjust his amiodarone as outpatient.     Allegra Lai. Terrence Dupont, M.D.     MNH/MEDQ  D:  01/30/2016  T:  01/30/2016  Job:  [id number]

## 2016-01-30 NOTE — Telephone Encounter (Signed)
  Relation to [id number] Call back number:[phone]:  Reason for call:  Patient was seen in the hospital due to a mild heart attack requesting a referral to [facility]: Clent Demark MD

## 2016-02-02 ENCOUNTER — Ambulatory Visit: Payer: Commercial Managed Care - HMO | Admitting: Family

## 2016-02-02 NOTE — Telephone Encounter (Signed)
Transition Care Management Follow-up Telephone Call   Date discharged? 01/30/16   How have you been since you were released from the hospital? "I'm just weak. I lost 20 lbs in 5 days. I'm just weak."   Do you understand why you were in the hospital? yes   Do you understand the discharge instructions? yes   Where were you discharged to? Home   Items Reviewed:  Medications reviewed: yes, pt was not aware that he was supposed to stop taking amlodipine so he had still been taking. He will hold as directed by Dr. Sharyn Lull.  Allergies reviewed: yes  Dietary changes reviewed: no, pt states he will review dietary recommendations w/ Dr. Annitta Jersey office  Referrals reviewed: yes, Dr. Rinaldo Cloud w/ [facility], pt informed that referral has been placed and he will be contacted with appointment.   Functional Questionnaire:   Activities of Daily Living (ADLs):   He states they are independent in the following: ambulation, bathing and hygiene, feeding, continence, grooming, toileting and dressing States they require assistance with the following: none   Any transportation issues/concerns?: no   Any patient concerns? yes, he was started on Eliquis in the hospital and is also taking daily aspirin. Should he continue to take these medication together?   Confirmed importance and date/time of follow-up visits scheduled yes  Provider Appointment booked with Sandford Craze, NP 02/12/16 @ 1pm  Confirmed with patient if condition begins to worsen call PCP or go to the ER.  Patient was given the office number and encouraged to call back with question or concerns.  : yes

## 2016-02-11 ENCOUNTER — Ambulatory Visit (INDEPENDENT_AMBULATORY_CARE_PROVIDER_SITE_OTHER): Payer: Commercial Managed Care - HMO | Admitting: Family

## 2016-02-11 ENCOUNTER — Encounter: Payer: Self-pay | Admitting: Family

## 2016-02-11 VITALS — BP 122/70 | HR 74 | Temp 98.1°F | Resp 14 | Ht 69.5 in | Wt 167.1 lb

## 2016-02-11 DIAGNOSIS — B079 Viral wart, unspecified: Secondary | ICD-10-CM | POA: Diagnosis not present

## 2016-02-11 DIAGNOSIS — I4892 Unspecified atrial flutter: Secondary | ICD-10-CM | POA: Diagnosis not present

## 2016-02-11 DIAGNOSIS — E1165 Type 2 diabetes mellitus with hyperglycemia: Secondary | ICD-10-CM

## 2016-02-11 DIAGNOSIS — Z794 Long term (current) use of insulin: Secondary | ICD-10-CM

## 2016-02-11 DIAGNOSIS — I4891 Unspecified atrial fibrillation: Secondary | ICD-10-CM

## 2016-02-11 DIAGNOSIS — D649 Anemia, unspecified: Secondary | ICD-10-CM

## 2016-02-11 DIAGNOSIS — E871 Hypo-osmolality and hyponatremia: Secondary | ICD-10-CM

## 2016-02-11 LAB — CBC WITH DIFFERENTIAL/PLATELET
Basophils Absolute: 0 10*3/uL (ref 0.0–0.1)
Basophils Relative: 0.6 % (ref 0.0–3.0)
Eosinophils Absolute: 0.1 10*3/uL (ref 0.0–0.7)
Eosinophils Relative: 0.9 % (ref 0.0–5.0)
HCT: 37.9 % — ABNORMAL LOW (ref 39.0–52.0)
Hemoglobin: 12.5 g/dL — ABNORMAL LOW (ref 13.0–17.0)
Lymphocytes Relative: 18 % (ref 12.0–46.0)
Lymphs Abs: 1.2 10*3/uL (ref 0.7–4.0)
MCHC: 32.9 g/dL (ref 30.0–36.0)
MCV: 91.4 fl (ref 78.0–100.0)
Monocytes Absolute: 1.2 10*3/uL — ABNORMAL HIGH (ref 0.1–1.0)
Monocytes Relative: 18.1 % — ABNORMAL HIGH (ref 3.0–12.0)
Neutro Abs: 4.1 10*3/uL (ref 1.4–7.7)
Neutrophils Relative %: 62.4 % (ref 43.0–77.0)
Platelets: 298 10*3/uL (ref 150.0–400.0)
RBC: 4.15 Mil/uL — ABNORMAL LOW (ref 4.22–5.81)
RDW: 14.8 % (ref 11.5–15.5)
WBC: 6.5 10*3/uL (ref 4.0–10.5)

## 2016-02-11 LAB — COMPREHENSIVE METABOLIC PANEL
ALT: 33 U/L (ref 0–53)
AST: 29 U/L (ref 0–37)
Albumin: 3.5 g/dL (ref 3.5–5.2)
Alkaline Phosphatase: 67 U/L (ref 39–117)
BUN: 8 mg/dL (ref 6–23)
CO2: 31 mEq/L (ref 19–32)
Calcium: 9.1 mg/dL (ref 8.4–10.5)
Chloride: 103 mEq/L (ref 96–112)
Creatinine, Ser: 0.99 mg/dL (ref 0.40–1.50)
GFR: 79.49 mL/min (ref >60.00)
Glucose, Bld: 151 mg/dL — ABNORMAL HIGH (ref 70–99)
Potassium: 4.1 mEq/L (ref 3.5–5.1)
Sodium: 137 mEq/L (ref 135–145)
Total Bilirubin: 0.9 mg/dL (ref 0.2–1.2)
Total Protein: 6.6 g/dL (ref 6.0–8.3)

## 2016-02-11 LAB — IRON: Iron: 96 ug/dL (ref 42–165)

## 2016-02-11 LAB — FERRITIN: Ferritin: 216.2 ng/mL (ref 22.0–322.0)

## 2016-02-11 NOTE — Progress Notes (Addendum)
Subjective:    Patient ID: Ryan [name], male    DOB: 1945/09/19, 70 y.o.   MRN: [id number]  HPI   [name] is a 70 yr old male who presents today for hospital follow up.  Hospital discharge summary is reviewed.  Pt was admitted 6/20-6/23/17. He was noted to be in atrial flutter with RVR.   He had a + tryponin and was felt to have likely had a probable small non-Q-wave MI due to demand ischemia.  He was felt to have had a resolving URI and Gastroenteritis prior to his admission.  His heart rate was regulated in the hospital with IV cardizem. He was placed on PO amiodarone with steady rate prior to discharge.  He was also placed on Eliquis.    Reports sugars 70-120 since returning home from the hospital. He is no longer taking metformin. He has a follow up appointment with Dr. Dietrich Pates.  Still a little weak.  Denies CP/SOB or heart racing.    Review of Systems    see HPI  Past Medical History  Diagnosis Date  . Diabetes mellitus   . History of chicken pox   . GERD (gastroesophageal reflux disease)   . Allergy   . Hyperlipidemia   . Hypertension   . Type 2 diabetes mellitus with hyperglycemia, with long-term current use of insulin (HCC) 09/08/2015     Social History   Social History  . Marital Status: Married    Spouse Name: N/A  . Number of Children: 1  . Years of Education: N/A   Occupational History  . Not on file.   Social History Main Topics  . Smoking status: Former Smoker    Types: Cigarettes    Quit date: 09/01/1965  . Smokeless tobacco: Never Used  . Alcohol Use: Not on file  . Drug Use: No  . Sexual Activity: Not on file   Other Topics Concern  . Not on file   Social History Narrative   Regular exercise:  5 x weekly   Caffeine Use: 1 cups coffee daily.   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   Married- One son and grandson- both live with them   Wife has ovarian cancer             Past Surgical History  Procedure Laterality Date  . Shoulder surgery   1990    right shoulder, torn rotator cuff.  . Eye surgery  1998    bilateral    Family History  Problem Relation Age of Onset  . Cancer Mother     cancer  . Heart disease Mother   . Hyperlipidemia Mother   . Hypertension Mother   . Hyperlipidemia Father   . Stroke Father   . Hypertension Father   . Diabetes Paternal Grandfather     No Known Allergies  Current Outpatient Prescriptions on File Prior to Visit  Medication Sig Dispense Refill  . ACCU-CHEK AVIVA PLUS test strip 1 each by Other route See admin instructions. Check blood sugars 6 times daily    . ACCU-CHEK SOFTCLIX LANCETS lancets 1 each by Other route See admin instructions. Check blood sugar 6 times daily    . amiodarone (PACERONE) 400 MG tablet Take 0.5 tablets (200 mg total) by mouth 2 (two) times daily. 60 tablet 3  . apixaban (ELIQUIS) 5 MG TABS tablet Take 1 tablet (5 mg total) by mouth 2 (two) times daily. 60 tablet 3  . aspirin 81 MG tablet Take 81 mg  by mouth daily.      Marland Kitchen atorvastatin (LIPITOR) 40 MG tablet TAKE 1 TABLET (40 MG TOTAL) BY MOUTH DAILY. 30 tablet 5  . Calcium Carbonate-Vitamin D (CALTRATE 600+D) 600-400 MG-UNIT per tablet Take 1 tablet by mouth 2 (two) times daily. Reported on 01/27/2016    . Insulin Syringe-Needle U-100 30G X 3/8" 1 ML MISC Use to inject insulin 4 times daily. 200 each 2  . LEVEMIR 100 UNIT/ML injection INJECT 0.37 ML (37 UNITS) INTO THE SKIN AT BEDTIME 20 mL 1  . lisinopril (PRINIVIL,ZESTRIL) 10 MG tablet TAKE 1 TABLET (10 MG) BY MOUTH DAILY. 90 tablet 1  . metoprolol succinate (TOPROL-XL) 25 MG 24 hr tablet TAKE 1 TABLET (25 MG) BY MOUTH DAILY. 30 tablet 5  . NOVOLOG 100 UNIT/ML injection INJECT 22 UNITS INTO THE SKIN 3 (THREE) TIMES DAILY WITH MEALS. WITH SLIDING SCALE. **PT NEEDS FOLLOW UP APPT** (Patient taking differently: INJECT 22 UNITS INTO THE SKIN 3 (THREE) TIMES DAILY WITH MEALS. WITH SLIDING SCALE. >200 add 2 units, may inject up to 25 units) 30 mL 1   No current  facility-administered medications on file prior to visit.    BP 122/70 mmHg  Pulse 74  Temp(Src) 98.1 F (36.7 C) (Oral)  Resp 14  Ht 5' 9.5" (1.765 m)  Wt 167 lb 2 oz (75.807 kg)  BMI 24.33 kg/m2    Objective:   Physical Exam  Constitutional: He is oriented to person, place, and time. He appears well-developed and well-nourished. No distress.  HENT:  Head: Normocephalic and atraumatic.  Cardiovascular: Normal rate and regular rhythm.   No murmur heard. Pulmonary/Chest: Effort normal and breath sounds normal. No respiratory distress. He has no wheezes. He has no rales.  Musculoskeletal: He exhibits no edema.  Neurological: He is alert and oriented to person, place, and time.  Skin: Skin is warm and dry.  Wart noted right forearm   Psychiatric: He has a normal mood and affect. His behavior is normal. Thought content normal.          Assessment & Plan:  Hyponatremia- noted in hospital labs, repeat sodium.  Anemia- noted in hospital labs. Obtain follow up cbc and iron/ferritin level.   Wart- right forearm.  After verbal consent wart was frozen x3 (freeze/thaw). Pt was advised to apply compound W once daily and follow back up in 1 month for re-treatment if needed.

## 2016-02-11 NOTE — Patient Instructions (Signed)
Please complete lab work prior to leaving. Glad you are feeling better!

## 2016-02-11 NOTE — Assessment & Plan Note (Signed)
Rate stable. Asymptomatic currently. On eliquis/amiodarone.  Has upcoming appointment with cardiology.

## 2016-02-11 NOTE — Assessment & Plan Note (Signed)
Sugars are stable since returning home from the hospital. Continue current dose of insulin.

## 2016-02-11 NOTE — Progress Notes (Signed)
Pre visit review using our clinic review tool, if applicable. No additional management support is needed unless otherwise documented below in the visit note. 

## 2016-02-12 MED FILL — AMLODIPINE BESYLATE 5 MG TA: 5 | 30 days supply | Qty: 30 | Fill #4

## 2016-02-12 MED FILL — ATORVASTATIN 40 MG TABLET: 40 | 30 days supply | Qty: 30 | Fill #2

## 2016-02-27 ENCOUNTER — Other Ambulatory Visit: Payer: Self-pay | Admitting: Family

## 2016-02-27 ENCOUNTER — Telehealth: Payer: Self-pay | Admitting: Family

## 2016-02-27 MED FILL — LISINOPRIL 10 MG TABLET: 10 | 90 days supply | Qty: 90 | Fill #0

## 2016-02-27 MED FILL — AMIODARONE HCL 200 MG TABLE: 200 | 30 days supply | Qty: 60 | Fill #1

## 2016-02-27 MED FILL — ELIQUIS 5 MG TABLET: 5 | 30 days supply | Qty: 60 | Fill #1

## 2016-02-27 MED FILL — METOPROLOL SUCC ER 25 MG TA: 25 | 30 days supply | Qty: 30 | Fill #0

## 2016-02-27 NOTE — Telephone Encounter (Signed)
Relation to FA:OZHY Call back number:[phone] Pharmacy:  Reason for call:  Patient inquiring if amLODipine (NORVASC) 2.5 MG tablet was d/c  While he was in the hospital please advise

## 2016-02-27 NOTE — Telephone Encounter (Signed)
Called and spoke to pt. Informed pt that per his hospital discharge summary for 01/27/16 under the discharge home medications the patient has been advised to stop amlodipine.   Pt verbalized understanding and has no further questions.

## 2016-02-27 NOTE — Telephone Encounter (Signed)
Rx request to pharmacy/SLS  

## 2016-03-04 ENCOUNTER — Encounter: Payer: Self-pay | Admitting: Internal Medicine

## 2016-03-04 ENCOUNTER — Ambulatory Visit (INDEPENDENT_AMBULATORY_CARE_PROVIDER_SITE_OTHER): Payer: Commercial Managed Care - HMO | Admitting: Internal Medicine

## 2016-03-04 VITALS — BP 150/77 | HR 45 | Ht 69.5 in | Wt 165.8 lb

## 2016-03-04 DIAGNOSIS — Z794 Long term (current) use of insulin: Secondary | ICD-10-CM

## 2016-03-04 DIAGNOSIS — R531 Weakness: Secondary | ICD-10-CM

## 2016-03-04 DIAGNOSIS — E1165 Type 2 diabetes mellitus with hyperglycemia: Secondary | ICD-10-CM

## 2016-03-04 DIAGNOSIS — R778 Other specified abnormalities of plasma proteins: Secondary | ICD-10-CM

## 2016-03-04 DIAGNOSIS — R7989 Other specified abnormal findings of blood chemistry: Secondary | ICD-10-CM | POA: Diagnosis not present

## 2016-03-04 MED ORDER — LISINOPRIL 10 MG PO TABS: 5.0000 mg | ORAL_TABLET | Freq: Every day | ORAL | 0 refills | Status: DC

## 2016-03-04 NOTE — Patient Instructions (Addendum)
Your physician has recommended you make the following change in your medication:  1.) stop amiodarone 2.) decrease lisinopril to 5 mg once a day  Your physician has requested that you have en exercise stress myoview. For further information please visit [email or web address] Please follow instruction sheet, as given.  Your physician recommends that you schedule a follow-up appointment with APP on a day Dr. Tenny Craw is here.  (03/29/16)

## 2016-03-04 NOTE — Progress Notes (Signed)
Cardiology Office Note   Date:  03/04/2016   ID:  Ryan [name], DOB 1945-10-25, MRN [id number]  PCP:  Nance Pear., NP  Cardiologist:   Dorris Carnes, MD    Pt presents for continued cardiac care   Self referred     History of Present Illness: Ryan [name] is a 70 y.o. male He was admitted in June 2017 with N/V  AFeeling bad  Wt down 10 lbs  Came to ER  Found to be in atrial flutter with 2:1 block  Rx cardiazem  Trop 1.66  Pt denied CP   Admitted Converted on own   Echo showed LVEF 55 to 60^  Mild MR   LA midly dilated    Since d/c he has been feeling better No palpitations  No CP   He has hadsom dizziness    Outpatient Medications Prior to Visit  Medication Sig Dispense Refill  . ACCU-CHEK AVIVA PLUS test strip 1 each by Other route See admin instructions. Check blood sugars 6 times daily    . ACCU-CHEK SOFTCLIX LANCETS lancets 1 each by Other route See admin instructions. Check blood sugar 6 times daily    . amiodarone (PACERONE) 400 MG tablet Take 0.5 tablets (200 mg total) by mouth 2 (two) times daily. 60 tablet 3  . apixaban (ELIQUIS) 5 MG TABS tablet Take 1 tablet (5 mg total) by mouth 2 (two) times daily. 60 tablet 3  . aspirin 81 MG tablet Take 81 mg by mouth daily.      Marland Kitchen atorvastatin (LIPITOR) 40 MG tablet TAKE 1 TABLET (40 MG TOTAL) BY MOUTH DAILY. 30 tablet 5  . Calcium Carbonate-Vitamin D (CALTRATE 600+D) 600-400 MG-UNIT per tablet Take 1 tablet by mouth 2 (two) times daily. Reported on 01/27/2016    . Insulin Syringe-Needle U-100 30G X 3/8" 1 ML MISC Use to inject insulin 4 times daily. 200 each 2  . LEVEMIR 100 UNIT/ML injection INJECT 0.37 ML (37 UNITS) INTO THE SKIN AT BEDTIME 20 mL 1  . lisinopril (PRINIVIL,ZESTRIL) 10 MG tablet TAKE 1 TABLET (10 MG) BY MOUTH DAILY. 90 tablet 0  . metoprolol succinate (TOPROL-XL) 25 MG 24 hr tablet TAKE 1 TABLET (25 MG) BY MOUTH DAILY. 30 tablet 3  . NOVOLOG 100 UNIT/ML injection INJECT 22 UNITS INTO THE SKIN 3 (THREE)  TIMES DAILY WITH MEALS. WITH SLIDING SCALE. **PT NEEDS FOLLOW UP APPT** (Patient taking differently: INJECT 22 UNITS INTO THE SKIN 3 (THREE) TIMES DAILY WITH MEALS. WITH SLIDING SCALE. >200 add 2 units, may inject up to 25 units) 30 mL 1   No facility-administered medications prior to visit.      Allergies:   Review of patient's allergies indicates no known allergies.   Past Medical History:  Diagnosis Date  . Allergy   . Diabetes mellitus   . GERD (gastroesophageal reflux disease)   . History of chicken pox   . Hyperlipidemia   . Hypertension   . Type 2 diabetes mellitus with hyperglycemia, with long-term current use of insulin ([place]) 09/08/2015    Past Surgical History:  Procedure Laterality Date  . EYE SURGERY  1998   bilateral  . SHOULDER SURGERY  1990   right shoulder, torn rotator cuff.     Social History:  The patient  reports that he quit smoking about 50 years ago. His smoking use included Cigarettes. He has never used smokeless tobacco. He reports that he does not use drugs.   Family History:  The  patient's family history includes Cancer in his mother; Diabetes in his paternal grandfather; Heart disease in his mother; Hyperlipidemia in his father and mother; Hypertension in his father and mother; Stroke in his father.    ROS:  Please see the history of present illness. All other systems are reviewed and  Negative to the above problem except as noted.    PHYSICAL EXAM: VS:  BP (!) 150/77   Pulse (!) 45   Ht 5' 9.5" (1.765 m)   Wt 165 lb 12.8 oz (75.2 kg)   BMI 24.13 kg/m   GEN: Well nourished, well developed, in no acute distress  HEENT: normal  Neck: no JVD, carotid bruits, or masses Cardiac: RRR; no murmurs, rubs, or gallops,no edema  Respiratory:  clear to auscultation bilaterally, normal work of breathing GI: soft, nontender, nondistended, + BS  No hepatomegaly  MS: no deformity Moving all extremities   Skin: warm and dry, no rash Neuro:  Strength and  sensation are intact Psych: euthymic mood, full affect   EKG:  EKG is ordered today.  SB 45 bpm  Prolonged QT     Lipid Panel    Component Value Date/Time   CHOL 147 09/17/2015 0933   TRIG 96.0 09/17/2015 0933   HDL 46.50 09/17/2015 0933   CHOLHDL 3 09/17/2015 0933   VLDL 19.2 09/17/2015 0933   LDLCALC 81 09/17/2015 0933      Wt Readings from Last 3 Encounters:  03/04/16 165 lb 12.8 oz (75.2 kg)  02/11/16 167 lb 2 oz (75.8 kg)  01/30/16 160 lb 12.8 oz (72.9 kg)      ASSESSMENT AND PLAN:  1  Atrial fib/flutter  The patient has only had one spell  He had been ill at the time and converted on own.   I would recomm stopping amiodarone  Continue b blocker and anticoag  Watch for dizziness F/U in fall    2.  Hx NSTEMI  Given hx of DM I would recomm a lexiscan myoview to evaluate for ischemia  3  Dizziness Cut lisinopril in 1/2  Follow BP    4  DM  Has had for about 19 years    Follows with C [name]    Signed, Dorris Carnes, MD  03/04/2016 2:13 PM    [facility] [address], [place], [place]  [address] Phone: [phone]; Fax: [phone]

## 2016-03-08 ENCOUNTER — Ambulatory Visit: Payer: Commercial Managed Care - HMO | Admitting: Family

## 2016-03-08 MED FILL — NovoLOG 100 UNIT/ML SOLN: 100 | 45 days supply | Qty: 30 | Fill #1

## 2016-03-09 ENCOUNTER — Telehealth (HOSPITAL_COMMUNITY): Payer: Self-pay | Admitting: *Deleted

## 2016-03-09 ENCOUNTER — Encounter: Payer: Self-pay | Admitting: Internal Medicine

## 2016-03-09 ENCOUNTER — Ambulatory Visit (INDEPENDENT_AMBULATORY_CARE_PROVIDER_SITE_OTHER): Payer: Commercial Managed Care - HMO | Admitting: Internal Medicine

## 2016-03-09 VITALS — BP 138/72 | HR 55 | Ht 68.0 in | Wt 169.0 lb

## 2016-03-09 DIAGNOSIS — Z794 Long term (current) use of insulin: Secondary | ICD-10-CM | POA: Diagnosis not present

## 2016-03-09 DIAGNOSIS — E1165 Type 2 diabetes mellitus with hyperglycemia: Secondary | ICD-10-CM

## 2016-03-09 MED ORDER — INSULIN ASPART 100 UNIT/ML ~~LOC~~ SOLN: SUBCUTANEOUS | 2 refills | Status: DC

## 2016-03-09 MED ORDER — INSULIN DETEMIR 100 UNIT/ML ~~LOC~~ SOLN: SUBCUTANEOUS | 2 refills | Status: DC

## 2016-03-09 NOTE — Progress Notes (Signed)
Patient ID: Ryan [name], male   DOB: 03-20-1946, 70 y.o.   MRN: [id number]  HPI: Ryan [name] is a 70 y.o.-year-old male, returning for f/u for DM2 (?1), dx 1999, insulin-dependent since 2005, uncontrolled, without complications. Last visit 3 mo ago.  He was admitted 01/27/2016 for A fib (? AMI) >> Amiodarone stopped then. He will have a stress test - will see Dr [name].  He was on the insulin pump after dx. He was taken off the pump ~2005 by the [place] as this was not covered anymore.   Last hemoglobin A1c was: 01/27/2016: Fructosamine 282 >>  the calculated HbA1c is 6.4%, which is excellent! Lab Results  Component Value Date   HGBA1C 7.9 (H) 01/27/2016   HGBA1C 7.6 12/08/2015   HGBA1C 7.4 09/08/2015   He is now on: - Levemir 37 units at bedtime - Humalog mealtime with an ICR 1:15 units 3x a day 15 min before a meal - Humalog sliding scale as follows:  - 150-175: + 1 unit  - 176-200: + 2 units  - 201-225: + 3 units  - 226-250: + 4 units  - >250: + 5 units Was on Metformin 1000 mg with dinner (added 08/2015 >> stopped)  Pt checks his sugars 6-8 a day and they are better - per exemplary log (!):  - am: 50, 80-140, 190 >> 84-220 >> 75-143, 160 (stopped eating after 8 am) >> 77, 100-180, 220 >> 70-200 >> 60-150, 170 - 2h after b'fast: 77-150 >> 70, 74-140 >> 75, 80-136 >> 72-154 >> 70-160 >> 70-158 >> 76-138 - before lunch: 70-143 >> 70-130 >> 75, 80-140, 180 >> 86-120 >> 80-141 >> 70-125, 166 >> 70-115, 145 - 2h after lunch: 43 x1, 70-129 >> 70-131 >> 70-120 >> 78-130 >> 70-163 >> 75-135, 161 >> 92-134, 164 - before dinner: 94-150 >> 72-120 >> 101-150, 170 >> 78-148 >> 78-166 >> 70-145, 171 >> 75-134 - 2h after dinner: 87-145 >> 75-140, 180 >> 82-164 >> 78-140, 158 >> 81-173, 214 >> 75-157 >> 70-130, 166 - bedtime: n/c >> 78-179 >> 100-164 >> 91-180 >> 75-180 - nighttime: n/c >> 130-200 >> 108-195, 200 >> 75-143 Lowest sugar was 60. he has hypoglycemia awareness at 60.  Highest sugar  was 180 >> 177 >> 220 >> 200 >> 180.  Pt's meals are: - Breakfast: egg beaters + toast - Lunch: cold cuts or salads - Dinner: meat + veggies - Snacks: fruit  - no CKD, last BUN/creatinine:  Lab Results  Component Value Date   BUN 8 02/11/2016   CREATININE 0.99 02/11/2016  On Lisinopril. - last set of lipids: Lab Results  Component Value Date   CHOL 147 09/17/2015   HDL 46.50 09/17/2015   LDLCALC 81 09/17/2015   TRIG 96.0 09/17/2015   CHOLHDL 3 09/17/2015  On Pravastatin. - last eye exam was: Component Date Value  . HM Diabetic Eye Exam 02/24/2015 No Retinopathy    - no numbness and tingling in his feet. Foot exam normal in 03/2015.  In 2016, he had 3 deaths in the family within 4 months: His mother, his father, and his wife (ovarian cancer). He is taking care of his 82 year old grandson.  I reviewed pt's medications, allergies, PMH, social hx, family hx, and changes were documented in the history of present illness. Otherwise, unchanged from my initial visit note.  ROS: Constitutional: no weight loss or gain, no subjective hyperthermia/hypothermia Eyes: no blurry vision, no xerophthalmia ENT: no sore throat, no nodules  palpated in throat, no dysphagia/odynophagia, no hoarseness Cardiovascular: no CP/SOB/palpitations/leg swelling Respiratory: no cough/SOB Gastrointestinal: no N/V/D/C/heartburn Musculoskeletal: no muscle/joint aches Skin: no rashes Neurological: no tremors/numbness/tingling/dizziness  PE: BP 138/72 (BP Location: Left Arm, Patient Position: Sitting)   Pulse (!) 55   Ht 5' 8"$  (1.727 m)   Wt 169 lb (76.7 kg)   SpO2 97%   BMI 25.70 kg/m  Body mass index is 25.7 kg/m.    Wt Readings from Last 3 Encounters:  03/09/16 169 lb (76.7 kg)  03/04/16 165 lb 12.8 oz (75.2 kg)  02/11/16 167 lb 2 oz (75.8 kg)   Constitutional: normal weight, in NAD Eyes: PERRLA, EOMI, no exophthalmos ENT: moist mucous membranes, no thyromegaly, no cervical  lymphadenopathy Cardiovascular: RRR, No MRG Respiratory: CTA B Gastrointestinal: abdomen soft, NT, ND, BS+ Musculoskeletal: no deformities, strength intact in all 4 Skin: moist, warm, no rashes Neurological: no tremor with outstretched hands, DTR normal in all 4  ASSESSMENT: 1. DM2 or 1, insulin-dependent, uncontrolled, without complications  PLAN:  1. Patient with long-standing, fairly controlled diabetes, on basal-bolus insulin regimen, with slightly lower sugars >> will decrease Levemir. We also discussed to decrease Levemir further the night b/f his stress test. - he had to stop Metformin b/c GI sxs >> will keep off for now  - I suggested to:  Patient Instructions  Please decrease: - Levemir to 32 units at bedtime - the night before the stress test, take only 25.  Please continue: - Humalog mealtime with an ICR 1:15 units 3x a day 15 min before a meal - Humalog sliding scale as follows:  - 150-175: + 1 unit  - 176-200: + 2 units  - 201-225: + 3 units  - 226-250: + 4 units  - >250: + 5 units  Please return in 3 months with your sugar log.   Please stop at the lab.  - he has an exemplary sugar log - advised for yearly eye exams - he needs one - check fructosamine today  - Return to clinic in 3 mo with sugar log   Office Visit on 03/09/2016  Component Date Value Ref Range Status  . Fructosamine 03/12/2016 252  190 - 270 umol/L Final   HbA1c calculated from the fructosamine is great, at 5.9%!  [name], MD PhD [facility] Endocrinology

## 2016-03-09 NOTE — Patient Instructions (Addendum)
Please decrease: - Levemir to 32 units at bedtime - the night before the stress test, take only 25.  Please continue: - Humalog mealtime with an ICR 1:15 units 3x a day 15 min before a meal - Humalog sliding scale as follows:  - 150-175: + 1 unit  - 176-200: + 2 units  - 201-225: + 3 units  - 226-250: + 4 units  - >250: + 5 units  Please return in 3 months with your sugar log.   Please stop at the lab.

## 2016-03-09 NOTE — Telephone Encounter (Signed)
Patient given detailed instructions per Myocardial Perfusion Study Information Sheet for the test on  03/11/16. Patient notified to arrive 15 minutes early and that it is imperative to arrive on time for appointment to keep from having the test rescheduled.  If you need to cancel or reschedule your appointment, please call the office within 24 hours of your appointment. Failure to do so may result in a cancellation of your appointment, and a $50 no show fee. Patient verbalized understanding. Antionette Char

## 2016-03-11 ENCOUNTER — Ambulatory Visit (HOSPITAL_COMMUNITY): Payer: Commercial Managed Care - HMO | Attending: Cardiology

## 2016-03-11 DIAGNOSIS — Z794 Long term (current) use of insulin: Secondary | ICD-10-CM | POA: Diagnosis not present

## 2016-03-11 DIAGNOSIS — I10 Essential (primary) hypertension: Secondary | ICD-10-CM | POA: Diagnosis not present

## 2016-03-11 DIAGNOSIS — R531 Weakness: Secondary | ICD-10-CM | POA: Insufficient documentation

## 2016-03-11 DIAGNOSIS — I4891 Unspecified atrial fibrillation: Secondary | ICD-10-CM | POA: Diagnosis not present

## 2016-03-11 DIAGNOSIS — Z8249 Family history of ischemic heart disease and other diseases of the circulatory system: Secondary | ICD-10-CM | POA: Diagnosis not present

## 2016-03-11 DIAGNOSIS — R7989 Other specified abnormal findings of blood chemistry: Secondary | ICD-10-CM

## 2016-03-11 DIAGNOSIS — R42 Dizziness and giddiness: Secondary | ICD-10-CM | POA: Diagnosis not present

## 2016-03-11 DIAGNOSIS — R778 Other specified abnormalities of plasma proteins: Secondary | ICD-10-CM

## 2016-03-11 DIAGNOSIS — E1165 Type 2 diabetes mellitus with hyperglycemia: Secondary | ICD-10-CM | POA: Diagnosis not present

## 2016-03-11 LAB — MYOCARDIAL PERFUSION IMAGING
LV dias vol: 133 mL (ref 62–150)
LV sys vol: 60 mL
Peak HR: 103 {beats}/min
RATE: 0.34
Rest HR: 50 {beats}/min
SDS: 0
SRS: 4
SSS: 4
TID: 0.92

## 2016-03-11 MED ORDER — TECHNETIUM TC 99M TETROFOSMIN IV KIT
32.5000 | PACK | Freq: Once | INTRAVENOUS | Status: AC | PRN
  Administered 2016-03-11: 33 via INTRAVENOUS
  Filled 2016-03-11: qty 33

## 2016-03-11 MED ORDER — REGADENOSON 0.4 MG/5ML IV SOLN
0.4000 mg | Freq: Once | INTRAVENOUS | Status: AC
  Administered 2016-03-11: 0.4 mg via INTRAVENOUS

## 2016-03-11 MED ORDER — TECHNETIUM TC 99M TETROFOSMIN IV KIT
11.0000 | PACK | Freq: Once | INTRAVENOUS | Status: AC | PRN
  Administered 2016-03-11: 11 via INTRAVENOUS
  Filled 2016-03-11: qty 11

## 2016-03-12 LAB — FRUCTOSAMINE: Fructosamine: 252 umol/L (ref 190–270)

## 2016-03-15 MED FILL — ATORVASTATIN 40 MG TABLET: 40 | 30 days supply | Qty: 30 | Fill #3

## 2016-03-29 ENCOUNTER — Ambulatory Visit: Payer: Commercial Managed Care - HMO | Admitting: Physician Assistant

## 2016-03-29 MED FILL — LEVEMIR 100 UNITS/ML VIAL: 100 | 55 days supply | Qty: 20 | Fill #1

## 2016-03-29 MED FILL — ELIQUIS 5 MG TABLET: 5 | 30 days supply | Qty: 60 | Fill #2

## 2016-04-01 ENCOUNTER — Encounter: Payer: Self-pay | Admitting: Physician Assistant

## 2016-04-01 ENCOUNTER — Ambulatory Visit (INDEPENDENT_AMBULATORY_CARE_PROVIDER_SITE_OTHER): Payer: Commercial Managed Care - HMO | Admitting: Physician Assistant

## 2016-04-01 VITALS — BP 130/84 | HR 49 | Ht 69.5 in | Wt 171.1 lb

## 2016-04-01 DIAGNOSIS — R778 Other specified abnormalities of plasma proteins: Secondary | ICD-10-CM

## 2016-04-01 DIAGNOSIS — R7989 Other specified abnormal findings of blood chemistry: Secondary | ICD-10-CM

## 2016-04-01 DIAGNOSIS — I4892 Unspecified atrial flutter: Secondary | ICD-10-CM | POA: Diagnosis not present

## 2016-04-01 DIAGNOSIS — I10 Essential (primary) hypertension: Secondary | ICD-10-CM | POA: Diagnosis not present

## 2016-04-01 NOTE — Progress Notes (Signed)
Cardiology Office Note    Date:  04/01/2016   ID:  Ryan [name], DOB 03/08/1946, MRN [id number]  PCP:  [name]'SULLIVAN,[name] S., NP  Cardiologist: Dr. Tenny Crawoss  Chief Complaint  Patient presents with  . Follow-up    atrial flutter    History of Present Illness:  Ryan [name] is a 70 y.o. male  with history of atrial flutter with 2 to one block in the setting of nausea and vomiting. He was treated with Cardizem and amiodarone and converted to normal sinus rhythm. NSTEMI felt secondary to demand ischemia with Troponin of 1.66 but he had no chest pain. Echo showed LVEF 55-60% with mild MR LA mildly dilated. He last saw Dr. Tenny Crawoss 03/04/16 at which time he was doing well. She stopped the amiodarone recommended continuing beta blocker and anticoagulation. Given his history of NSTEMI she recommended Lexi scan Myoview to rule out ischemia. He was also having some dizziness and she decreases lisinopril in half. He had normal Lexi scan 03/11/16.  Patient comes in today feeling well. He says his blood pressure has been running 119-126/70. He like to get back to exercising. He was walking 40 miles per week. He denies any chest pain, palpitations, dizziness or presyncope.   Past Medical History:  Diagnosis Date  . Allergy   . Diabetes mellitus   . GERD (gastroesophageal reflux disease)   . History of chicken pox   . Hyperlipidemia   . Hypertension   . Type 2 diabetes mellitus with hyperglycemia, with long-term current use of insulin ([facility]) 09/08/2015    Past Surgical History:  Procedure Laterality Date  . EYE SURGERY  1998   bilateral  . SHOULDER SURGERY  1990   right shoulder, torn rotator cuff.    Current Medications: Outpatient Medications Prior to Visit  Medication Sig Dispense Refill  . ACCU-CHEK AVIVA PLUS test strip 1 each by Other route See admin instructions. Check blood sugars 6 times daily    . ACCU-CHEK SOFTCLIX LANCETS lancets 1 each by Other route See admin instructions. Check  blood sugar 6 times daily    . apixaban (ELIQUIS) 5 MG TABS tablet Take 1 tablet (5 mg total) by mouth 2 (two) times daily. 60 tablet 3  . aspirin 81 MG tablet Take 81 mg by mouth daily.      Marland Kitchen. atorvastatin (LIPITOR) 40 MG tablet TAKE 1 TABLET (40 MG TOTAL) BY MOUTH DAILY. 30 tablet 5  . Calcium Carbonate-Vitamin D (CALTRATE 600+D) 600-400 MG-UNIT per tablet Take 1 tablet by mouth 2 (two) times daily. Reported on 01/27/2016    . insulin aspart (NOVOLOG) 100 UNIT/ML injection INJECT 22-25 UNITS INTO THE SKIN 3 (THREE) TIMES DAILY WITH MEALS. WITH SLIDING SCALE. 30 mL 2  . insulin detemir (LEVEMIR) 100 UNIT/ML injection INJECT 0.32 ML (32 UNITS) INTO THE SKIN AT BEDTIME 20 mL 2  . Insulin Syringe-Needle U-100 30G X 3/8" 1 ML MISC Use to inject insulin 4 times daily. 200 each 2  . lisinopril (PRINIVIL,ZESTRIL) 10 MG tablet Take 0.5 tablets (5 mg total) by mouth daily. 90 tablet 0  . metoprolol succinate (TOPROL-XL) 25 MG 24 hr tablet TAKE 1 TABLET (25 MG) BY MOUTH DAILY. 30 tablet 3   No facility-administered medications prior to visit.      Allergies:   Review of patient's allergies indicates no known allergies.   Social History   Social History  . Marital status: Married    Spouse name: N/A  . Number of children:  1  . Years of education: N/A   Social History Main Topics  . Smoking status: Former Smoker    Types: Cigarettes    Quit date: 09/01/1965  . Smokeless tobacco: Never Used  . Alcohol use None  . Drug use: No  . Sexual activity: Not Asked   Other Topics Concern  . None   Social History Narrative   Regular exercise:  5 x weekly   Caffeine Use: 1 cups coffee daily.   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   Married- One son and grandson- both live with them   Wife has ovarian cancer              Family History:  The patient's   family history includes Cancer in his mother; Diabetes in his paternal grandfather; Heart disease in his mother; Hyperlipidemia in his father and  mother; Hypertension in his father and mother; Stroke in his father.   ROS:   Please see the history of present illness.    Review of Systems  Constitution: Negative.  HENT: Negative.   Cardiovascular: Negative.   Respiratory: Negative.   Endocrine: Negative.   Hematologic/Lymphatic: Negative.   Musculoskeletal: Negative.   Gastrointestinal: Negative.   Genitourinary: Negative.   Neurological: Negative.    All other systems reviewed and are negative.   PHYSICAL EXAM:   VS:  BP 130/84   Pulse (!) 49   Ht 5' 9.5" (1.765 m)   Wt 171 lb 1.9 oz (77.6 kg)   BMI 24.91 kg/m   Physical Exam  GEN: Well nourished, well developed, in no acute distress  Neck: no JVD, carotid bruits, or masses Cardiac:RRR; no murmurs, rubs, or gallops  Respiratory:  clear to auscultation bilaterally, normal work of breathing GI: soft, nontender, nondistended, + BS Ext: without cyanosis, clubbing, or edema, Good distal pulses bilaterally MS: no deformity or atrophy  Skin: warm and dry, no rash Psych: euthymic mood, full affect  Wt Readings from Last 3 Encounters:  04/01/16 171 lb 1.9 oz (77.6 kg)  03/09/16 169 lb (76.7 kg)  03/04/16 165 lb 12.8 oz (75.2 kg)      Studies/Labs Reviewed:   EKG:  EKG is ordered today.  The ekg ordered today demonstrates Sinus bradycardia 49 bpm nonspecific ST-T wave changes  Recent Labs: 01/27/2016: Magnesium 2.5 01/30/2016: TSH 2.335 02/11/2016: ALT 33; BUN 8; Creatinine, Ser 0.99; Hemoglobin 12.5; Platelets 298.0; Potassium 4.1; Sodium 137   Lipid Panel    Component Value Date/Time   CHOL 147 09/17/2015 0933   TRIG 96.0 09/17/2015 0933   HDL 46.50 09/17/2015 0933   CHOLHDL 3 09/17/2015 0933   VLDL 19.2 09/17/2015 0933   LDLCALC 81 09/17/2015 0933    Additional studies/ records that were reviewed today include:  Lexi scan Myoview 03/11/16 Perfusion Summary Normal resting and stress perfusion. No ischemia or infarction EF 55%  Overall Study Impression  Myocardial perfusion is normal. The study is normal. This is a low risk study. Overall left ventricular systolic function was normal. LV cavity size is normal. Nuclear stress EF: 55%. The left ventricular ejection fraction is normal (55-65%). There is no prior study for comparison.        ASSESSMENT:    1. Atrial flutter, unspecified type (HCC)   2. Essential hypertension   3. Elevated troponin      PLAN:  In order of problems listed above:  Atrial flutter converted to normal sinus rhythm and maintaining sinus rhythm on low-dose metoprolol and Eliquis. Follow-up with Dr.  [name] in 2 months  Essential hypertension well controlled on lower dose lisinopril and no further dizziness  Elevated troponins in the setting of rapid atrial flutter and GI virus. Lexi scan Myoview normal without ischemia. No chest pain no further workup at this time.    Medication Adjustments/Labs and Tests Ordered: Current medicines are reviewed at length with the patient today.  Concerns regarding medicines are outlined above.  Medication changes, Labs and Tests ordered today are listed in the Patient Instructions below. There are no Patient Instructions on file for this visit.   Signed, Jacolyn Reedy, PA-C  04/01/2016 1:13 PM    [facility] [address] [place], [place], [place]  [address] Phone: [phone]; Fax: [phone]

## 2016-04-01 NOTE — Patient Instructions (Signed)
Medication Instructions:  Your physician recommends that you continue on your current medications as directed. Please refer to the Current Medication list given to you today.   Labwork: -None  Testing/Procedures: -None  Follow-Up: Your physician recommends that you keep scheduled follow-up appointment with Dr.[name].    Any Other Special Instructions Will Be Listed Below (If Applicable).     If you need a refill on your cardiac medications before your next appointment, please call your pharmacy.

## 2016-04-14 MED FILL — NovoLOG 100 UNIT/ML SOLN: 100 | 40 days supply | Qty: 30 | Fill #0

## 2016-04-14 MED FILL — ATORVASTATIN 40 MG TABLET: 40 | 30 days supply | Qty: 30 | Fill #4

## 2016-04-20 ENCOUNTER — Ambulatory Visit (INDEPENDENT_AMBULATORY_CARE_PROVIDER_SITE_OTHER): Payer: Commercial Managed Care - HMO | Admitting: Behavioral Health

## 2016-04-20 DIAGNOSIS — Z23 Encounter for immunization: Secondary | ICD-10-CM

## 2016-04-20 NOTE — Progress Notes (Addendum)
Pre visit review using our clinic review tool, if applicable. No additional management support is needed unless otherwise documented below in the visit note.  Patient in clinic today for Influenza vaccination. IM given in Left Deltoid. Patient tolerated injection well. 

## 2016-05-07 ENCOUNTER — Encounter: Payer: Self-pay | Admitting: Internal Medicine

## 2016-05-12 LAB — HM DIABETES EYE EXAM

## 2016-05-12 MED FILL — METOPROLOL SUCC ER 25 MG TA: 25 | 30 days supply | Qty: 30 | Fill #1

## 2016-05-14 ENCOUNTER — Ambulatory Visit: Payer: Commercial Managed Care - HMO | Admitting: Family

## 2016-05-14 ENCOUNTER — Telehealth: Payer: Self-pay | Admitting: Family

## 2016-05-14 MED FILL — ATORVASTATIN 40 MG TABLET: 40 | 30 days supply | Qty: 30 | Fill #5

## 2016-05-14 NOTE — Telephone Encounter (Signed)
Patient called at 8:33am cancelling 11am appointment for today due to car concerns. Charge or no charge

## 2016-05-14 NOTE — Telephone Encounter (Signed)
No charge. 

## 2016-05-21 ENCOUNTER — Other Ambulatory Visit: Payer: Self-pay | Admitting: Internal Medicine

## 2016-05-21 MED FILL — LEVEMIR 100 UNITS/ML VIAL: 100 | 54 days supply | Qty: 20 | Fill #0

## 2016-05-21 MED FILL — NovoLOG 100 UNIT/ML SOLN: 100 | 40 days supply | Qty: 30 | Fill #1

## 2016-05-24 ENCOUNTER — Encounter: Payer: Self-pay | Admitting: Internal Medicine

## 2016-05-24 ENCOUNTER — Ambulatory Visit (INDEPENDENT_AMBULATORY_CARE_PROVIDER_SITE_OTHER): Payer: Commercial Managed Care - HMO | Admitting: Internal Medicine

## 2016-05-24 VITALS — BP 140/82 | HR 57 | Ht 69.5 in | Wt 176.2 lb

## 2016-05-24 DIAGNOSIS — I4892 Unspecified atrial flutter: Secondary | ICD-10-CM

## 2016-05-24 LAB — CBC
HCT: 43.1 % (ref 38.5–50.0)
Hemoglobin: 14.1 g/dL (ref 13.2–17.1)
MCH: 29.7 pg (ref 27.0–33.0)
MCHC: 32.7 g/dL (ref 32.0–36.0)
MCV: 90.9 fL (ref 80.0–100.0)
MPV: 10.2 fL (ref 7.5–12.5)
Platelets: 224 10*3/uL (ref 140–400)
RBC: 4.74 MIL/uL (ref 4.20–5.80)
RDW: 13.6 % (ref 11.0–15.0)
WBC: 7.5 10*3/uL (ref 3.8–10.8)

## 2016-05-24 MED ORDER — APIXABAN 5 MG PO TABS: 5.0000 mg | ORAL_TABLET | Freq: Two times a day (BID) | ORAL | 3 refills | Status: DC

## 2016-05-24 MED FILL — ELIQUIS 5 MG TABLET: 5 | 30 days supply | Qty: 60 | Fill #0

## 2016-05-24 NOTE — Patient Instructions (Addendum)
Your physician recommends that you continue on your current medications as directed. Please refer to the Current Medication list given to you today.  Your physician recommends that you return for lab work today (CBC)  Your physician wants you to follow-up in: 6 months with Dr. [name].  You will receive a reminder letter in the mail two months in advance. If you don't receive a letter, please call our office to schedule the follow-up appointment.  

## 2016-05-24 NOTE — Progress Notes (Signed)
Cardiology Office Note   Date:  05/24/2016   ID:  SKILAR LEFRANCOIS, DOB March 20, 1946, MRN [id number]  PCP:  [name]., NP  Cardiologist:   Dietrich Pates, MD   F/U of atrial flutter and HTN      History of Present Illness: Ryan [name] is a 70 y.o. male with a history of atrial flutter Seen last by Leda Gauze in Aug 2017  I shw him in July  Lexiscan in Aug was normal  Echo with normal LVEF   Since he was in clinic last his breathing has been good  No CP   No palpitations     Outpatient Medications Prior to Visit  Medication Sig Dispense Refill  . ACCU-CHEK AVIVA PLUS test strip 1 each by Other route See admin instructions. Check blood sugars 6 times daily    . ACCU-CHEK SOFTCLIX LANCETS lancets 1 each by Other route See admin instructions. Check blood sugar 6 times daily    . apixaban (ELIQUIS) 5 MG TABS tablet Take 1 tablet (5 mg total) by mouth 2 (two) times daily. 60 tablet 3  . aspirin 81 MG tablet Take 81 mg by mouth daily.      Marland Kitchen atorvastatin (LIPITOR) 40 MG tablet TAKE 1 TABLET (40 MG TOTAL) BY MOUTH DAILY. 30 tablet 5  . Calcium Carbonate-Vitamin D (CALTRATE 600+D) 600-400 MG-UNIT per tablet Take 1 tablet by mouth 2 (two) times daily. Reported on 01/27/2016    . insulin aspart (NOVOLOG) 100 UNIT/ML injection INJECT 22-25 UNITS INTO THE SKIN 3 (THREE) TIMES DAILY WITH MEALS. WITH SLIDING SCALE. 30 mL 2  . insulin detemir (LEVEMIR) 100 UNIT/ML injection INJECT 0.32 ML (32 UNITS) INTO THE SKIN AT BEDTIME 20 mL 2  . Insulin Syringe-Needle U-100 30G X 3/8" 1 ML MISC Use to inject insulin 4 times daily. 200 each 2  . lisinopril (PRINIVIL,ZESTRIL) 10 MG tablet Take 0.5 tablets (5 mg total) by mouth daily. 90 tablet 0  . metoprolol succinate (TOPROL-XL) 25 MG 24 hr tablet TAKE 1 TABLET (25 MG) BY MOUTH DAILY. 30 tablet 3  . LEVEMIR 100 UNIT/ML injection INJECT 0.37 ML (37 UNITS) INTO THE SKIN AT BEDTIME 20 mL 1   No facility-administered medications prior to visit.       Allergies:   Review of patient's allergies indicates no known allergies.   Past Medical History:  Diagnosis Date  . Allergy   . Diabetes mellitus   . GERD (gastroesophageal reflux disease)   . History of chicken pox   . Hyperlipidemia   . Hypertension   . Type 2 diabetes mellitus with hyperglycemia, with long-term current use of insulin (HCC) 09/08/2015    Past Surgical History:  Procedure Laterality Date  . EYE SURGERY  1998   bilateral  . SHOULDER SURGERY  1990   right shoulder, torn rotator cuff.     Social History:  The patient  reports that he quit smoking about 50 years ago. His smoking use included Cigarettes. He has never used smokeless tobacco. He reports that he does not use drugs.   Family History:  The patient's family history includes Cancer in his mother; Diabetes in his paternal grandfather; Heart disease in his mother; Hyperlipidemia in his father and mother; Hypertension in his father and mother; Stroke in his father.    ROS:  Please see the history of present illness. All other systems are reviewed and  Negative to the above problem except as noted.    PHYSICAL EXAM:  VS:  BP 140/82   Pulse (!) 57   Ht 5' 9.5" (1.765 m)   Wt 176 lb 4 oz (79.9 kg)   SpO2 98%   BMI 25.65 kg/m   GEN: Well nourished, well developed, in no acute distress HEENT: normal Neck: no JVD, carotid bruits, or masses Cardiac: RRR; no murmurs, rubs, or gallops,no edema  Respiratory:  clear to auscultation bilaterally, normal work of breathing GI: soft, nontender, nondistended, + BS  No hepatomegaly  MS: no deformity Moving all extremities   Skin: warm and dry, no rash Neuro:  Strength and sensation are intact Psych: euthymic mood, full affect   EKG:  EKG is  Not ordered today.   Lipid Panel    Component Value Date/Time   CHOL 147 09/17/2015 0933   TRIG 96.0 09/17/2015 0933   HDL 46.50 09/17/2015 0933   CHOLHDL 3 09/17/2015 0933   VLDL 19.2 09/17/2015 0933   LDLCALC  81 09/17/2015 0933      Wt Readings from Last 3 Encounters:  05/24/16 176 lb 4 oz (79.9 kg)  04/01/16 171 lb 1.9 oz (77.6 kg)  03/09/16 169 lb (76.7 kg)      ASSESSMENT AND PLAN: 1  Paroxysmal atrial flutter  APpears to be in SR now  I would recomm keeping pt on on Eliquis  Follow CBC    2  HTN  Good control at home  110s to 120s  Continue meds   3  HL  Would continue lipitor  F/U in 6 months  Encouraged him to stay active      Current medicines are reviewed at length with the patient today.  The patient does not have concerns regarding medicines.  Signed, Dietrich PatesPaula [name], MD  05/24/2016 2:36 PM    [facility] [address], [place], [place]  [address] Phone: [phone]; Fax: [phone]

## 2016-05-26 ENCOUNTER — Encounter: Payer: Self-pay | Admitting: Family

## 2016-05-26 NOTE — Progress Notes (Unsigned)
Received call from [facility] at [facility] office stating doctor confirmed pt does not have any diabetic retinopathy. States he worded his documentation wrong about dilated exam.

## 2016-05-26 NOTE — Progress Notes (Unsigned)
Received eye exam report from Dr.McFarland's office. Discrepancy in note regarding final retinopathy results. Call Toni Amend at Dr.McFarland's office for clarification--states she will call us back.

## 2016-06-10 ENCOUNTER — Ambulatory Visit (INDEPENDENT_AMBULATORY_CARE_PROVIDER_SITE_OTHER): Payer: Commercial Managed Care - HMO | Admitting: Internal Medicine

## 2016-06-10 ENCOUNTER — Encounter: Payer: Self-pay | Admitting: Internal Medicine

## 2016-06-10 VITALS — BP 122/82 | HR 54 | Ht 69.0 in | Wt 176.0 lb

## 2016-06-10 DIAGNOSIS — E1165 Type 2 diabetes mellitus with hyperglycemia: Secondary | ICD-10-CM

## 2016-06-10 DIAGNOSIS — Z794 Long term (current) use of insulin: Secondary | ICD-10-CM | POA: Diagnosis not present

## 2016-06-10 NOTE — Patient Instructions (Addendum)
Please continue: - Levemir to 32 units at bedtime - Humalog mealtime with an ICR 1:15 units 3x a day 15 min before a meal - Humalog sliding scale as follows:  - 150-175: + 1 unit  - 176-200: + 2 units  - 201-225: + 3 units  - 226-250: + 4 units  - >250: + 5 units  If you start exercising within 2h after a meal, reduce the insulin with that meal by 25-50%.  Please return in 3 months with your sugar log.   Please stop at the lab.

## 2016-06-10 NOTE — Progress Notes (Signed)
Patient ID: Ryan [name], male   DOB: 11/09/1945, 70 y.o.   MRN: [id number]  HPI: Ryan [name] is a 70 y.o.-year-old male, returning for f/u for DM2 (?1), dx 1999, insulin-dependent since 2005, uncontrolled, without complications. Last visit 3 mo ago.  He was dx'ed with A flutter  >> saw Dr. [name] >> stress test - normal. He is on Eliquis.  He was on an insulin pump after dx. He was taken off the pump ~2005 by the [place] as this was not covered anymore.   Last hemoglobin A1c was: 03/09/2016: HbA1c calculated from the fructosamine is great, at 5.9%! 01/27/2016: HbA1c calculated from fructosamine is 6.4% Lab Results  Component Value Date   HGBA1C 7.9 (H) 01/27/2016   HGBA1C 7.6 12/08/2015   HGBA1C 7.4 09/08/2015   He is now on: - Levemir 37 >> 32 units at bedtime - Humalog mealtime with an ICR 1:15 units 3x a day 15 min before a meal - Humalog sliding scale as follows:  - 150-175: + 1 unit  - 176-200: + 2 units  - 201-225: + 3 units  - 226-250: + 4 units  - >250: + 5 units Was on Metformin 1000 mg with dinner (added 08/2015 >> stopped)  Pt checks his sugars 6-8 a day and they are slightly higher - per exemplary log:  - am:  84-220 >> 75-143, 160 (stopped eating after 8 am) >> 77, 100-180, 220 >> 70-200 >> 60-150, 170 >> 75-200 - 2h after b'fast: 77-150 >> 70, 74-140 >> 75, 80-136 >> 72-154 >> 70-160 >> 70-158 >> 76-138 >> 70-163 - before lunch: 70-143 >> 70-130 >> 75, 80-140, 180 >> 86-120 >> 80-141 >> 70-125, 166 >> 70-115, 145 >> 70-124 - 2h after lunch: 43 x1, 70-129 >> 70-131 >> 70-120 >> 78-130 >> 70-163 >> 75-135, 161 >> 92-134, 164 >> 70-152 - before dinner: 94-150 >> 72-120 >> 101-150, 170 >> 78-148 >> 78-166 >> 70-145, 171 >> 75-134 >> 70-135, 150 - 2h after dinner: 87-145 >> 75-140, 180 >> 82-164 >> 78-140, 158 >> 81-173, 214 >> 75-157 >> 70-130, 166 >> 72-175 - bedtime: n/c >> 78-179 >> 100-164 >> 91-180 >> 75-180 >> 70-162, 200 - nighttime: n/c >> 130-200 >> 108-195, 200 >>  75-143 >> 72-167, 200 Lowest sugar was 60. he has hypoglycemia awareness at 60.  Highest sugar was 180 >> 177 >> 220 >> 200 >> 180.  Pt's meals are: - Breakfast: egg beaters + toast - Lunch: cold cuts or salads - Dinner: meat + veggies - Snacks: fruit  - no CKD, last BUN/creatinine:  Lab Results  Component Value Date   BUN 8 02/11/2016   CREATININE 0.99 02/11/2016  On Lisinopril. - last set of lipids: Lab Results  Component Value Date   CHOL 147 09/17/2015   HDL 46.50 09/17/2015   LDLCALC 81 09/17/2015   TRIG 96.0 09/17/2015   CHOLHDL 3 09/17/2015  On Pravastatin. - last eye exam was: Abstract on 05/27/2016  Component Date Value Ref Range Status  . HM Diabetic Eye Exam 05/12/2016 No Retinopathy  No Retinopathy Final   - no numbness and tingling in his feet.   In 2016, he had 3 deaths in the family within 4 months: His mother, his father, and his wife (ovarian cancer). He is taking care of his 59 year old grandson.  I reviewed pt's medications, allergies, PMH, social hx, family hx, and changes were documented in the history of present illness. Otherwise, unchanged from my initial visit  note.  ROS: Constitutional: no weight loss or gain, no subjective hyperthermia/hypothermia Eyes: no blurry vision, no xerophthalmia ENT: no sore throat, no nodules palpated in throat, no dysphagia/odynophagia, no hoarseness Cardiovascular: no CP/SOB/palpitations/leg swelling Respiratory: no cough/SOB Gastrointestinal: no N/V/D/C/heartburn Musculoskeletal: no muscle/joint aches Skin: no rashes Neurological: no tremors/numbness/tingling/dizziness  PE: BP 122/82 (BP Location: Left Arm, Patient Position: Sitting)   Pulse (!) 54   Ht '5\' 9"'$  (1.753 m)   Wt 176 lb (79.8 kg)   SpO2 96%   BMI 25.99 kg/m  Body mass index is 25.99 kg/m.    Wt Readings from Last 3 Encounters:  06/10/16 176 lb (79.8 kg)  05/24/16 176 lb 4 oz (79.9 kg)  04/01/16 171 lb 1.9 oz (77.6 kg)   Constitutional:  normal weight, in NAD Eyes: PERRLA, EOMI, no exophthalmos ENT: moist mucous membranes, no thyromegaly, no cervical lymphadenopathy Cardiovascular: RRR, No MRG Respiratory: CTA B Gastrointestinal: abdomen soft, NT, ND, BS+ Musculoskeletal: no deformities, strength intact in all 4 Skin: moist, warm, no rashes Neurological: no tremor with outstretched hands, DTR normal in all 4  ASSESSMENT: 1. DM2 or 1, insulin-dependent, uncontrolled, without complications  PLAN:  1. Patient with long-standing, fairly controlled diabetes, on basal-bolus insulin regimen, with slightly higher sugars after decreasing Levemir at last visit. As he may have sugars in the 70s at different times a day >> will not increase insulin doses for now. - he will start walking on the treadmill 2-3 miles/day >> advised to reduce Humalog (usually ~20 units per meal) if he plans to exercise within 2h after a meal - he had to stop Metformin b/c GI sxs >> will keep off for now  - I suggested to:  Patient Instructions  Please continue: - Levemir to 32 units at bedtime - Humalog mealtime with an ICR 1:15 units 3x a day 15 min before a meal - Humalog sliding scale as follows:  - 150-175: + 1 unit  - 176-200: + 2 units  - 201-225: + 3 units  - 226-250: + 4 units  - >250: + 5 units  If you start exercising within 2h after a meal, reduce the insulin with that meal by 25-50%.  Please return in 3 months with your sugar log.   Please stop at the lab.  - he has an exemplary sugar log - advised for yearly eye exams - he is UTD - also UTD with flu shot - check fructosamine today  - Return to clinic in 3 mo with sugar log   Office Visit on 06/10/2016  Component Date Value Ref Range Status  . Fructosamine 06/14/2016 285* 190 - 270 umol/L Final   HbA1c calculated from the fructosamine is 6.45%, higher, but still at goal.  [name], MD PhD [facility] Endocrinology

## 2016-06-14 ENCOUNTER — Other Ambulatory Visit: Payer: Self-pay | Admitting: Family

## 2016-06-14 LAB — FRUCTOSAMINE: Fructosamine: 285 umol/L — ABNORMAL HIGH (ref 190–270)

## 2016-06-14 MED FILL — LISINOPRIL 10 MG TABLET: 10 | 90 days supply | Qty: 90 | Fill #0

## 2016-06-14 MED FILL — METOPROLOL SUCC ER 25 MG TA: 25 | 30 days supply | Qty: 30 | Fill #2

## 2016-06-14 MED FILL — ATORVASTATIN 40 MG TABLET: 40 | 30 days supply | Qty: 30 | Fill #0

## 2016-06-23 MED FILL — ELIQUIS 5 MG TABLET: 5 | 30 days supply | Qty: 60 | Fill #1

## 2016-06-24 MED FILL — NovoLOG 100 UNIT/ML SOLN: 100 | 40 days supply | Qty: 30 | Fill #2

## 2016-07-15 MED FILL — METOPROLOL SUCC ER 25 MG TA: 25 | 30 days supply | Qty: 30 | Fill #3

## 2016-07-15 MED FILL — LEVEMIR 100 UNITS/ML VIAL: 100 | 54 days supply | Qty: 20 | Fill #1

## 2016-07-15 MED FILL — ATORVASTATIN 40 MG TABLET: 40 | 30 days supply | Qty: 30 | Fill #1

## 2016-07-26 ENCOUNTER — Other Ambulatory Visit: Payer: Self-pay | Admitting: Internal Medicine

## 2016-07-26 MED FILL — ELIQUIS 5 MG TABLET: 5 | 30 days supply | Qty: 60 | Fill #2

## 2016-07-28 MED FILL — NovoLOG 100 UNIT/ML SOLN: 100 | 40 days supply | Qty: 30 | Fill #0

## 2016-08-17 ENCOUNTER — Other Ambulatory Visit: Payer: Self-pay | Admitting: Family

## 2016-08-17 MED FILL — METOPROLOL SUCC ER 25 MG TA: 25 | 30 days supply | Qty: 30 | Fill #0

## 2016-08-17 MED FILL — ATORVASTATIN 40 MG TABLET: 40 | 30 days supply | Qty: 30 | Fill #2

## 2016-08-30 ENCOUNTER — Other Ambulatory Visit: Payer: Self-pay | Admitting: Internal Medicine

## 2016-08-30 MED FILL — NovoLOG 100 UNIT/ML SOLN: 100 | 40 days supply | Qty: 30 | Fill #1

## 2016-08-30 MED FILL — ELIQUIS 5 MG TABLET: 5 | 30 days supply | Qty: 60 | Fill #3

## 2016-08-30 NOTE — Telephone Encounter (Signed)
Pharmacy is calling to clarify the dosing of Pt's Novolog.  CB# [phone]

## 2016-08-31 MED FILL — LEVEMIR 100 UNITS/ML VIAL: 100 | 54 days supply | Qty: 20 | Fill #0

## 2016-08-31 NOTE — Telephone Encounter (Signed)
Patient pharmacy is requesting clarification on Novolog, I see it on the med list; but the last office visit note from you states Humalog; and I wanted to make sure it was alright to clarify the same dosages for the Novolog that you have down for the Humalog. Please advise. Thank you!

## 2016-08-31 NOTE — Telephone Encounter (Signed)
Yes, they are always interchangeable.

## 2016-09-10 ENCOUNTER — Ambulatory Visit (INDEPENDENT_AMBULATORY_CARE_PROVIDER_SITE_OTHER): Payer: PPO | Admitting: Internal Medicine

## 2016-09-10 ENCOUNTER — Encounter: Payer: Self-pay | Admitting: Internal Medicine

## 2016-09-10 VITALS — BP 122/80 | HR 59 | Ht 68.0 in | Wt 178.0 lb

## 2016-09-10 DIAGNOSIS — E1165 Type 2 diabetes mellitus with hyperglycemia: Secondary | ICD-10-CM

## 2016-09-10 DIAGNOSIS — Z794 Long term (current) use of insulin: Secondary | ICD-10-CM | POA: Diagnosis not present

## 2016-09-10 LAB — POCT GLYCOSYLATED HEMOGLOBIN (HGB A1C): Hemoglobin A1C: 7

## 2016-09-10 NOTE — Patient Instructions (Signed)
Please continue: - Levemir to 32 units at bedtime - Humalog mealtime with an ICR 1:15 units 3x a day 15 min before a meal - Humalog sliding scale as follows:  - 150-175: + 1 unit  - 176-200: + 2 units  - 201-225: + 3 units  - 226-250: + 4 units  - >250: + 5 units  If you start exercising within 2h after a meal, reduce the insulin with that meal by 25-50%.  Please return in 4 months with your sugar log.

## 2016-09-10 NOTE — Addendum Note (Signed)
Addended by: Darene Lamer T on: 09/10/2016 11:44 AM   Modules accepted: Orders

## 2016-09-10 NOTE — Progress Notes (Signed)
Patient ID: MIKLO THREATS, male   DOB: [birth year], 71 y.o.   MRN: [id number]  HPI: Ryan [name] is a 71 y.o.-year-old male, returning for f/u for DM2 (?1), dx 1999, insulin-dependent since 2005, uncontrolled, without complications. Last visit 3 mo ago.  Last hemoglobin A1c was: 06/10/2016: HbA1c calculated from the fructosamine is 6.45%, higher. 03/09/2016: HbA1c calculated from the fructosamine is great, at 5.9%! 01/27/2016: HbA1c calculated from fructosamine is 6.4% Lab Results  Component Value Date   HGBA1C 7.9 (H) 01/27/2016   HGBA1C 7.6 12/08/2015   HGBA1C 7.4 09/08/2015   He is now on: - Levemir 37 >> 32 units at bedtime - Humalog mealtime with an ICR 1:15 units 3x a day 15 min before a meal - Humalog sliding scale as follows:  - 150-175: + 1 unit  - 176-200: + 2 units  - 201-225: + 3 units  - 226-250: + 4 units  - >250: + 5 units Was on Metformin 1000 mg with dinner (added 08/2015 >> now off) He was on an insulin pump after dx. He was taken off the pump ~2005 by the [place] as this was not covered anymore.   Pt checks his sugars 6-8 a day and they are slightly higher - per exemplary log:  - am: 77, 100-180, 220 >> 70-200 >> 60-150, 170 >> 75-200 >> 83-, 118-190, 210 - 2h after b'fast: 72-154 >> 70-160 >> 70-158 >> 76-138 >> 70-163 >> 70-167 - before lunch:86-120 >> 80-141 >> 70-125, 166 >> 70-115, 145 >> 70-124 >> 70-135, 156 - 2h after lunch: 78-130 >> 70-163 >> 75-135, 161 >> 92-134, 164 >> 70-152 >> 70-160 - before dinner: 78-148 >> 78-166 >> 70-145, 171 >> 75-134 >> 70-135, 150 >> 77-135 - 2h after dinner: 78-140, 158 >> 81-173, 214 >> 75-157 >> 70-130, 166 >> 72-175 >> 74-150, 160 - bedtime: n/c >> 78-179 >> 100-164 >> 91-180 >> 75-180 >> 70-162, 200 >> 70-166 - nighttime: n/c >> 130-200 >> 108-195, 200 >> 75-143 >> 72-167, 200 >> 75-179 Lowest sugar was 60 >> 70. he has hypoglycemia awareness at 60.  Highest sugar was 180 >> 210  Pt's meals are: - Breakfast: egg beaters  + toast - Lunch: cold cuts or salads - Dinner: meat + veggies - Snacks: fruit  He exercises daily: strength, walks on treadmill  - no CKD, last BUN/creatinine:  Lab Results  Component Value Date   BUN 8 02/11/2016   CREATININE 0.99 02/11/2016  On Lisinopril. - last set of lipids: Lab Results  Component Value Date   CHOL 147 09/17/2015   HDL 46.50 09/17/2015   LDLCALC 81 09/17/2015   TRIG 96.0 09/17/2015   CHOLHDL 3 09/17/2015  On Pravastatin. - last eye exam was: Abstract on 05/27/2016  Component Date Value Ref Range Status  . HM Diabetic Eye Exam 05/12/2016 No Retinopathy  No Retinopathy Final   - no numbness and tingling in his feet.   In 2016, he had 3 deaths in the family within 4 months: His mother, his father, and his wife (ovarian cancer). He is taking care of his 36 year old grandson. His son and his daughter in law are both on disability for illness.   He was dx'ed with A flutter  >> saw Dr. [name] >> stress test - normal. He is on Eliquis.  I reviewed pt's medications, allergies, PMH, social hx, family hx, and changes were documented in the history of present illness. Otherwise, unchanged from my initial visit note.  ROS:  Constitutional: no weight loss or gain, no subjective hyperthermia/hypothermia Eyes: no blurry vision, no xerophthalmia ENT: no sore throat, no nodules palpated in throat, no dysphagia/odynophagia, no hoarseness Cardiovascular: no CP/SOB/palpitations/leg swelling Respiratory: no cough/SOB Gastrointestinal: no N/V/D/C/heartburn Musculoskeletal: no muscle/joint aches Skin: no rashes Neurological: no tremors/numbness/tingling/dizziness  PE: BP 122/80 (BP Location: Left Arm, Patient Position: Sitting)   Pulse (!) 59   Ht '5\' 8"'$  (1.727 m)   Wt 178 lb (80.7 kg)   SpO2 97%   BMI 27.06 kg/m  Body mass index is 27.06 kg/m.    Wt Readings from Last 3 Encounters:  09/10/16 178 lb (80.7 kg)  06/10/16 176 lb (79.8 kg)  05/24/16 176 lb 4 oz (79.9  kg)   Constitutional: normal weight, in NAD Eyes: PERRLA, EOMI, no exophthalmos ENT: moist mucous membranes, no thyromegaly, no cervical lymphadenopathy Cardiovascular: RRR, No MRG Respiratory: CTA B Gastrointestinal: abdomen soft, NT, ND, BS+ Musculoskeletal: no deformities, strength intact in all 4 Skin: moist, warm, no rashes Neurological: no tremor with outstretched hands, DTR normal in all 4  ASSESSMENT: 1. DM2 or 1, insulin-dependent, uncontrolled, without complications  PLAN:  1. Patient with long-standing, fairly controlled diabetes, on basal-bolus insulin regimen, with good sugars throughout the day execpt higher in am. They decrease after he starts being active in am.  - He had to stop Metformin b/c GI sxs >> will keep off as sugars are at or close to goal  - I suggested to:  Patient Instructions  Please continue: - Levemir to 32 units at bedtime - Humalog mealtime with an ICR 1:15 units 3x a day 15 min before a meal - Humalog sliding scale as follows:  - 150-175: + 1 unit  - 176-200: + 2 units  - 201-225: + 3 units  - 226-250: + 4 units  - >250: + 5 units  If you start exercising within 2h after a meal, reduce the insulin with that meal by 25-50%.  Please return in 4 months with your sugar log.   - he has an exemplary sugar log which we reviewed together - advised for yearly eye exams - he is UTD - also UTD with flu shot - he requested a HbA1c >> returned at 7%, which is lower than before, however, this is higher than calculated from fructosamine for him. Since this is at goal, will not check a fructosamine today. - Return to clinic in 4 mo with sugar log   [name], MD PhD [facility] Endocrinology

## 2016-09-16 ENCOUNTER — Other Ambulatory Visit: Payer: Self-pay | Admitting: Family

## 2016-09-16 MED FILL — ATORVASTATIN 40 MG TABLET: 40 | 30 days supply | Qty: 30 | Fill #3

## 2016-09-16 MED FILL — METOPROLOL SUCC ER 25 MG TA: 25 | 30 days supply | Qty: 30 | Fill #1

## 2016-09-17 MED FILL — LISINOPRIL 10 MG TABLET: 10 | 90 days supply | Qty: 90 | Fill #0

## 2016-09-17 NOTE — Telephone Encounter (Signed)
Medication filled to pharmacy as requested.   

## 2016-09-30 ENCOUNTER — Other Ambulatory Visit: Payer: Self-pay | Admitting: Internal Medicine

## 2016-09-30 MED FILL — ELIQUIS 5 MG TABLET: 5 | 30 days supply | Qty: 60 | Fill #0

## 2016-09-30 NOTE — Telephone Encounter (Signed)
Last saw Dr Tenny Craw on 05/24/16, last labs 02/11/16 Creat 0.99, weight-80.7, Age 71, per specified criteria pt is on appropriate dosage of Eliquis 5mg  BID.  Will refill rx x 6 months.

## 2016-10-07 MED FILL — NovoLOG 100 UNIT/ML SOLN: 100 | 40 days supply | Qty: 30 | Fill #2

## 2016-10-18 MED FILL — ATORVASTATIN 40 MG TABLET: 40 | 30 days supply | Qty: 30 | Fill #4

## 2016-10-18 MED FILL — METOPROLOL SUCC ER 25 MG TA: 25 | 30 days supply | Qty: 30 | Fill #2

## 2016-10-25 MED FILL — LEVEMIR 100 UNITS/ML VIAL: 100 | 54 days supply | Qty: 20 | Fill #1

## 2016-10-29 MED FILL — ELIQUIS 5 MG TABLET: 5 | 30 days supply | Qty: 60 | Fill #1

## 2016-11-02 ENCOUNTER — Telehealth: Payer: Self-pay | Admitting: Family

## 2016-11-02 NOTE — Telephone Encounter (Signed)
Left pt message asking to call Allison back directly at [phone] to schedule AWV. Thanks! °

## 2016-11-03 NOTE — Telephone Encounter (Signed)
Scheduled  11/22/16 °

## 2016-11-15 ENCOUNTER — Other Ambulatory Visit: Payer: Self-pay | Admitting: Internal Medicine

## 2016-11-15 MED FILL — NovoLOG 100 UNIT/ML SOLN: 100 | 38 days supply | Qty: 30 | Fill #0

## 2016-11-16 ENCOUNTER — Other Ambulatory Visit: Payer: Self-pay | Admitting: Internal Medicine

## 2016-11-16 MED FILL — ULTICARE SYR 1 ML 30GX5/16": 30G X 5/16" | 50 days supply | Qty: 200 | Fill #0

## 2016-11-16 MED FILL — ULTICARE SYR 1 ML 30GX5/16: 30G X 5/16" | 50 days supply | Qty: 200 | Fill #0

## 2016-11-18 MED FILL — ATORVASTATIN 40 MG TABLET: 40 | 30 days supply | Qty: 30 | Fill #5

## 2016-11-18 MED FILL — METOPROLOL SUCC ER 25 MG TA: 25 | 30 days supply | Qty: 30 | Fill #3

## 2016-11-19 NOTE — Progress Notes (Signed)
Pre visit review using our clinic review tool, if applicable. No additional management support is needed unless otherwise documented below in the visit note. 

## 2016-11-19 NOTE — Progress Notes (Signed)
Subjective:   Ryan [name] is a 71 y.o. male who presents for Medicare Annual/Subsequent preventive examination.  Review of Systems:  No ROS.  Medicare Wellness Visit.  Cardiac Risk Factors include: advanced age (>30men, >60 women);diabetes mellitus;dyslipidemia;hypertension;male gender  Sleep patterns: feels rested on waking, gets up 0 times nightly to void and sleeps 5 hours nightly. No daytime napping. Reports he has never been much of a sleeper. Home Safety/Smoke Alarms: Feels safe in home. Smoke alarms and carbon monoxide detectors in place.  Living environment; residence and Firearm Safety: Lives w/ son and his family. 1-story house/ trailer, no firearms. Seat Belt Safety/Bike Helmet: Wears seat belt.   Counseling:   Eye Exam- Dr. Harriette Bouillon yearly.  Dental- Does not follow w/ dentist regularly.  Male:   CCS- last Cologuard 02/27/15. Negative.     PSA-  Lab Results  Component Value Date   PSA 0.10 02/04/2015   PSA 0.16 05/25/2011      Objective:    Vitals: BP 136/84   Pulse (!) 53   Ht 5\' 9"  (1.753 m)   Wt 178 lb (80.7 kg)   SpO2 98%   BMI 26.29 kg/m   Body mass index is 26.29 kg/m.  Tobacco History  Smoking Status  . Former Smoker  . Types: Cigarettes  . Quit date: 09/01/1965  Smokeless Tobacco  . Never Used     Counseling given: Not Answered   Past Medical History:  Diagnosis Date  . Allergy   . Diabetes mellitus   . GERD (gastroesophageal reflux disease)   . History of chicken pox   . Hyperlipidemia   . Hypertension   . Type 2 diabetes mellitus with hyperglycemia, with long-term current use of insulin (HCC) 09/08/2015   Past Surgical History:  Procedure Laterality Date  . EYE SURGERY  1998   bilateral  . SHOULDER SURGERY  1990   right shoulder, torn rotator cuff.   Family History  Problem Relation Age of Onset  . Cancer Mother     cancer  . Heart disease Mother   . Hyperlipidemia Mother   . Hypertension Mother   . Hyperlipidemia  Father   . Stroke Father   . Hypertension Father   . Diabetes Paternal Grandfather    History  Sexual Activity  . Sexual activity: No    Outpatient Encounter Prescriptions as of 11/22/2016  Medication Sig  . ACCU-CHEK AVIVA PLUS test strip 1 each by Other route See admin instructions. Check blood sugars 6 times daily  . ACCU-CHEK SOFTCLIX LANCETS lancets 1 each by Other route See admin instructions. Check blood sugar 6 times daily  . aspirin 81 MG tablet Take 81 mg by mouth daily.    Marland Kitchen atorvastatin (LIPITOR) 40 MG tablet TAKE 1 TABLET (40 MG TOTAL) BY MOUTH DAILY.  . Calcium Carbonate-Vitamin D (CALTRATE 600+D) 600-400 MG-UNIT per tablet Take 1 tablet by mouth 2 (two) times daily. Reported on 01/27/2016  . ELIQUIS 5 MG TABS tablet TAKE 1 TABLET (5 MG TOTAL) BY MOUTH 2 TIMES DAILY.  Marland Kitchen insulin detemir (LEVEMIR) 100 UNIT/ML injection INJECT 0.32 ML (32 UNITS) INTO THE SKIN AT BEDTIME  . lisinopril (PRINIVIL,ZESTRIL) 10 MG tablet Take 0.5 tablets (5 mg total) by mouth daily. (Patient taking differently: Take 5-10 mg by mouth daily. )  . metoprolol succinate (TOPROL-XL) 25 MG 24 hr tablet TAKE 1 TABLET (25 MG) BY MOUTH DAILY.  Marland Kitchen NOVOLOG 100 UNIT/ML injection INJECT 22-25 UNITS INTO THE SKIN 3 TIMES DAILY WITH  MEALS. WITH SLIDING SCALE. "MAX 80 UNITS DAILY"  . ULTICARE INSULIN SYRINGE 30G X 5/16" 1 ML MISC USE TO INJECT INSULIN 4 TIMES DAILY.  Marland Kitchen LEVEMIR 100 UNIT/ML injection INJECT 0.37 ML (37 UNITS) INTO THE SKIN AT BEDTIME (Patient not taking: Reported on 11/22/2016)  . [DISCONTINUED] lisinopril (PRINIVIL,ZESTRIL) 10 MG tablet TAKE 1 TABLET (10 MG) BY MOUTH DAILY. (Patient not taking: Reported on 11/22/2016)   No facility-administered encounter medications on file as of 11/22/2016.     Activities of Daily Living In your present state of health, do you have any difficulty performing the following activities: 11/22/2016 01/27/2016  Hearing? N N  Vision? N N  Difficulty concentrating or making  decisions? N N  Walking or climbing stairs? N N  Dressing or bathing? N N  Doing errands, shopping? N N  Preparing Food and eating ? N -  Using the Toilet? N -  In the past six months, have you accidently leaked urine? N -  Do you have problems with loss of bowel control? N -  Managing your Medications? N -  Managing your Finances? N -  Housekeeping or managing your Housekeeping? N -  Some recent data might be hidden    Patient Care Team: Sandford Craze, NP as PCP - General (Internal Medicine) Carlus Pavlov, MD as Consulting Physician (Endocrinology) Glenford Peers, OD as Consulting Physician (Optometry) Pricilla Riffle, MD as Consulting Physician (Cardiology)   Assessment:    Physical assessment deferred to PCP.  Exercise Activities and Dietary recommendations Current Exercise Habits: Home exercise routine, Type of exercise: walking;Other - see comments (stationary bike), Frequency (Times/Week): 7  Diet (meal preparation, eat out, water intake, caffeinated beverages, dairy products, fruits and vegetables): in general, a "healthy" diet  , well balanced, diabetic. Tries to eat several small meals throughout the day. Drinks some water but reports not as much as he should.  Goals    . Increase water intake      Fall Risk Fall Risk  11/22/2016 02/04/2015 02/04/2015  Falls in the past year? No No Yes  Number falls in past yr: - - 1  Injury with Fall? - - No   Depression Screen PHQ 2/9 Scores 11/22/2016 02/04/2015 02/04/2015  PHQ - 2 Score 0 0 0    Cognitive Function MMSE - Mini Mental State Exam 11/22/2016  Orientation to time 5  Orientation to Place 5  Registration 3  Attention/ Calculation 5  Recall 2  Language- name 2 objects 2  Language- repeat 1  Language- follow 3 step command 3  Language- read & follow direction 1  Write a sentence 1  Copy design 0  Total score 28        Immunization History  Administered Date(s) Administered  . H1N1 11/19/2008  .  Influenza Split 05/25/2011, 04/20/2012  . Influenza Whole 06/06/2009  . Influenza, High Dose Seasonal PF 04/20/2016  . Influenza,inj,Quad PF,36+ Mos 05/07/2013, 05/27/2014, 05/13/2015  . Pneumococcal Conjugate-13 10/19/2004, 05/11/2013  . Pneumococcal Polysaccharide-23 05/25/2011  . Td 08/09/2001, 08/25/2011  . Zoster 08/25/2011   Screening Tests Health Maintenance  Topic Date Due  . INFLUENZA VACCINE  03/09/2017  . HEMOGLOBIN A1C  03/10/2017  . OPHTHALMOLOGY EXAM  05/12/2017  . FOOT EXAM  11/22/2017  . Fecal DNA (Cologuard)  02/26/2018  . TETANUS/TDAP  08/24/2021  . Hepatitis C Screening  Completed  . PNA vac Low Risk Adult  Completed      Plan:    Schedule follow-up w/ PCP.  Dental resources handout provided.  Create and bring a copy of your advance directives to your next office visit.  Continue to eat heart healthy diet (full of fruits, vegetables, whole grains, lean protein, water--limit salt, fat, and sugar intake) and increase physical activity as tolerated.  Continue doing brain stimulating activities (puzzles, reading, adult coloring books, staying active) to keep memory sharp.   During the course of the visit the patient was educated and counseled about the following appropriate screening and preventive services:   Vaccines to include Pneumococcal, Influenza, Td, HCV  Cardiovascular Disease  Colorectal cancer screening  Diabetes screening  Prostate Cancer Screening  Glaucoma screening  Nutrition counseling   Patient Instructions (the written plan) was given to the patient.    Starla Link, RN  11/22/2016

## 2016-11-22 ENCOUNTER — Ambulatory Visit (INDEPENDENT_AMBULATORY_CARE_PROVIDER_SITE_OTHER): Payer: PPO | Admitting: *Deleted

## 2016-11-22 ENCOUNTER — Encounter: Payer: Self-pay | Admitting: *Deleted

## 2016-11-22 VITALS — BP 136/84 | HR 53 | Ht 69.0 in | Wt 178.0 lb

## 2016-11-22 DIAGNOSIS — Z0000 Encounter for general adult medical examination without abnormal findings: Secondary | ICD-10-CM

## 2016-11-22 NOTE — Progress Notes (Signed)
Noted and agree. 

## 2016-11-22 NOTE — Patient Instructions (Addendum)
[name] , Thank you for taking time to come for your Medicare Wellness Visit. I appreciate your ongoing commitment to your health goals. Please review the following plan we discussed and let me know if I can assist you in the future.   Create and bring a copy of your advance directives to your next office visit. Please call the office to arrange a follow-up appointment w/ [name].   These are the goals we discussed: Goals    . Increase water intake       This is a list of the screening recommended for you and due dates:  Health Maintenance  Topic Date Due  . Flu Shot  03/09/2017  . Hemoglobin A1C  03/10/2017  . Eye exam for diabetics  05/12/2017  . Complete foot exam   11/22/2017  . Cologuard (Stool DNA test)  02/26/2018  . Tetanus Vaccine  08/24/2021  .  Hepatitis C: One time screening is recommended by Center for Disease Control  (CDC) for  adults born from [birth year] through 1965.   Completed  . Pneumonia vaccines  Completed    Diabetes and Foot Care Diabetes may cause you to have problems because of poor blood supply (circulation) to your feet and legs. This may cause the skin on your feet to become thinner, break easier, and heal more slowly. Your skin may become dry, and the skin may peel and crack. You may also have nerve damage in your legs and feet causing decreased feeling in them. You may not notice minor injuries to your feet that could lead to infections or more serious problems. Taking care of your feet is one of the most important things you can do for yourself. Follow these instructions at home:  Wear shoes at all times, even in the house. Do not go barefoot. Bare feet are easily injured.  Check your feet daily for blisters, cuts, and redness. If you cannot see the bottom of your feet, use a mirror or ask someone for help.  Wash your feet with warm water (do not use hot water) and mild soap. Then pat your feet and the areas between your toes until they are completely  dry. Do not soak your feet as this can dry your skin.  Apply a moisturizing lotion or petroleum jelly (that does not contain alcohol and is unscented) to the skin on your feet and to dry, brittle toenails. Do not apply lotion between your toes.  Trim your toenails straight across. Do not dig under them or around the cuticle. File the edges of your nails with an emery board or nail file.  Do not cut corns or calluses or try to remove them with medicine.  Wear clean socks or stockings every day. Make sure they are not too tight. Do not wear knee-high stockings since they may decrease blood flow to your legs.  Wear shoes that fit properly and have enough cushioning. To break in new shoes, wear them for just a few hours a day. This prevents you from injuring your feet. Always look in your shoes before you put them on to be sure there are no objects inside.  Do not cross your legs. This may decrease the blood flow to your feet.  If you find a minor scrape, cut, or break in the skin on your feet, keep it and the skin around it clean and dry. These areas may be cleansed with mild soap and water. Do not cleanse the area with peroxide, alcohol, or  iodine.  When you remove an adhesive bandage, be sure not to damage the skin around it.  If you have a wound, look at it several times a day to make sure it is healing.  Do not use heating pads or hot water bottles. They may burn your skin. If you have lost feeling in your feet or legs, you may not know it is happening until it is too late.  Make sure your health care provider performs a complete foot exam at least annually or more often if you have foot problems. Report any cuts, sores, or bruises to your health care provider immediately. Contact a health care provider if:  You have an injury that is not healing.  You have cuts or breaks in the skin.  You have an ingrown nail.  You notice redness on your legs or feet.  You feel burning or tingling  in your legs or feet.  You have pain or cramps in your legs and feet.  Your legs or feet are numb.  Your feet always feel cold. Get help right away if:  There is increasing redness, swelling, or pain in or around a wound.  There is a red line that goes up your leg.  Pus is coming from a wound.  You develop a fever or as directed by your health care provider.  You notice a bad smell coming from an ulcer or wound. This information is not intended to replace advice given to you by your health care provider. Make sure you discuss any questions you have with your health care provider. Document Released: 07/23/2000 Document Revised: 01/01/2016 Document Reviewed: 01/02/2013 Elsevier Interactive Patient Education  2017 [employer].

## 2016-11-30 MED FILL — ELIQUIS 5 MG TABLET: 5 | 30 days supply | Qty: 60 | Fill #2

## 2016-12-20 ENCOUNTER — Telehealth: Payer: Self-pay | Admitting: Family

## 2016-12-20 ENCOUNTER — Other Ambulatory Visit: Payer: Self-pay | Admitting: Internal Medicine

## 2016-12-20 MED FILL — ATORVASTATIN 40 MG TABLET: 40 | 30 days supply | Qty: 30 | Fill #0

## 2016-12-20 MED FILL — METOPROLOL SUCC ER 25 MG TA: 25 | 30 days supply | Qty: 30 | Fill #0

## 2016-12-20 MED FILL — LEVEMIR 100 UNITS/ML VIAL: 100 | 54 days supply | Qty: 20 | Fill #0

## 2016-12-20 MED FILL — LISINOPRIL 10 MG TABLET: 10 | 30 days supply | Qty: 30 | Fill #0

## 2016-12-20 NOTE — Telephone Encounter (Signed)
eScribe request from [facility] for refill on Atorvastatin 40 mg, Metoprolol 25 mg, Lisinopril 10 mg Last filled - Atorvastatin: 06/14/16, #30x5; Metoprolol: 08/17/16, #30x3; Lisinopril: 03/04/16, #90x0 Last AEX - 02/11/16, OV with PCP [Pt had AWV on 11/22/16] Next AEX - Patient was due  For ROV w/PCP in 3-Mths [facility] 05/14/16]  Requested drug refills are authorized 30-day only, however, the patient needs further evaluation and/or laboratory testing before further refills are given. Ask him to make an F/U appointment for this/SLS 05/14 Thanks.

## 2016-12-20 NOTE — Telephone Encounter (Signed)
Pt has been scheduled for 01/11/17 at 8:30a

## 2016-12-23 MED FILL — NovoLOG 100 UNIT/ML SOLN: 100 | 38 days supply | Qty: 30 | Fill #1

## 2016-12-30 MED FILL — ELIQUIS 5 MG TABLET: 5 | 30 days supply | Qty: 60 | Fill #3

## 2017-01-10 ENCOUNTER — Ambulatory Visit (INDEPENDENT_AMBULATORY_CARE_PROVIDER_SITE_OTHER): Payer: PPO | Admitting: Internal Medicine

## 2017-01-10 ENCOUNTER — Encounter: Payer: Self-pay | Admitting: Internal Medicine

## 2017-01-10 VITALS — BP 130/78 | HR 63 | Wt 178.0 lb

## 2017-01-10 DIAGNOSIS — E1165 Type 2 diabetes mellitus with hyperglycemia: Secondary | ICD-10-CM | POA: Diagnosis not present

## 2017-01-10 DIAGNOSIS — Z794 Long term (current) use of insulin: Secondary | ICD-10-CM | POA: Diagnosis not present

## 2017-01-10 LAB — POCT GLYCOSYLATED HEMOGLOBIN (HGB A1C): Hemoglobin A1C: 7.1

## 2017-01-10 MED ORDER — INSULIN DETEMIR 100 UNIT/ML ~~LOC~~ SOLN: SUBCUTANEOUS | 3 refills | Status: DC

## 2017-01-10 NOTE — Patient Instructions (Addendum)
Please increase: - Levemir to 36 units at bedtime  Change: - Novolog mealtime ICR 1:8 units 3x a day 15 min before a meal  Continue: - Novolog sliding scale as follows:  - 150-175: + 1 unit  - 176-200: + 2 units  - 201-225: + 3 units  - 226-250: + 4 units  - >250: + 5 units  If you start exercising within 2h after a meal, reduce the insulin with that meal by 25-50%.  Please return in 3-4 months with your sugar log.

## 2017-01-10 NOTE — Progress Notes (Signed)
Patient ID: Ryan [name], male   DOB: 09/27/1945, 71 y.o.   MRN: [id number]  HPI: Ryan [name] is a 71 y.o.-year-old male, returning for f/u for DM2 (?1), dx 1999, insulin-dependent since 2005, uncontrolled, without complications. Last visit 4 mo ago.  Last hemoglobin A1c was: 06/10/2016: HbA1c calculated from the fructosamine is 6.45%, higher. 03/09/2016: HbA1c calculated from the fructosamine is great, at 5.9%! 01/27/2016: HbA1c calculated from fructosamine is 6.4% Lab Results  Component Value Date   HGBA1C 7.0 09/10/2016   HGBA1C 7.9 (H) 01/27/2016   HGBA1C 7.6 12/08/2015   He is now on: - Levemir 37 >> 32 units at bedtime - Humalog mealtime  actually using a ICR ~1:5 3x a day 15 min before a meal - ends up with 10-20 units before meals - Humalog sliding scale as follows:  - 150-175: + 1 unit  - 176-200: + 2 units  - 201-225: + 3 units  - 226-250: + 4 units  - >250: + 5 units Was on Metformin 1000 mg with dinner (added 08/2015 >> now off) He was on an insulin pump after dx. He was taken off the pump ~2005 by the [place] as this was not covered anymore.   Pt checks his sugars 6-8x a day - exemplary log - am: 60-150, 170 >> 75-200 >> 83-, 118-190, 210 >> 83-179, 185, 180 - 2h after b'fast: 76-138 >> 70-163 >> 70-167 >> 70-150 - before lunch:70-115, 145 >> 70-124 >> 70-135, 156 >> 70-152 - 2h after lunch: 92-134, 164 >> 70-152 >> 70-160 >> 75-126 - before dinner: 75-134 >> 70-135, 150 >> 77-135 >> 74-115 - 2h after dinner: 70-130, 166 >> 72-175 >> 74-150, 160 >> 70-166 - bedtime:  75-180 >> 70-162, 200 >> 70-166 >> 70-180 - nighttime:  75-143 >> 72-167, 200 >> 75-179 >> 50 x1, 70-107 Lowest sugar was 60 >> 70 >> 50 x 1 at night (small dinner). he has hypoglycemia awareness at 60.  Highest sugar was 180 >> 210 >> 180  Pt's meals are: - Breakfast: egg beaters + toast - Lunch: cold cuts or salads - Dinner: meat + veggies - Snacks: fruit  He exercises daily: strength, walks on  treadmill  - No CKD, last BUN/creatinine:  Lab Results  Component Value Date   BUN 8 02/11/2016   CREATININE 0.99 02/11/2016  On Lisinopril. - last set of lipids: Lab Results  Component Value Date   CHOL 147 09/17/2015   HDL 46.50 09/17/2015   LDLCALC 81 09/17/2015   TRIG 96.0 09/17/2015   CHOLHDL 3 09/17/2015  On Pravastatin. - last eye exam was: Abstract on 05/27/2016  Component Date Value Ref Range Status  . HM Diabetic Eye Exam 05/12/2016 No Retinopathy  No Retinopathy Final   - he denies numbness and tingling in his feet.   In 2016, he had 3 deaths in the family within 4 months: His mother, his father, and his wife (ovarian cancer). He is taking care of his 40 year old grandson. His son and his daughter in law are both on disability for illness.   He was dx'ed with A flutter  >> saw Dr. [name] >> stress test - normal. He is on Eliquis.  ROS: Constitutional: no weight gain/no weight loss, no fatigue, no subjective hyperthermia, no subjective hypothermia Eyes: no blurry vision, no xerophthalmia ENT: no sore throat, no nodules palpated in throat, no dysphagia, no odynophagia, no hoarseness Cardiovascular: no CP/no SOB/no palpitations/no leg swelling Respiratory: no cough/no SOB/no wheezing Gastrointestinal: no  N/no V/no D/no C/no acid reflux Musculoskeletal: no muscle aches/no joint aches Skin: no rashes, no hair loss Neurological: no tremors/no numbness/no tingling/no dizziness  I reviewed pt's medications, allergies, PMH, social hx, family hx, and changes were documented in the history of present illness. Otherwise, unchanged from my initial visit note.  PE: BP 130/78 (BP Location: Left Arm, Patient Position: Sitting)   Pulse 63   Wt 178 lb (80.7 kg)   SpO2 96%   BMI 26.29 kg/m  Body mass index is 26.29 kg/m.    Wt Readings from Last 3 Encounters:  01/10/17 178 lb (80.7 kg)  11/22/16 178 lb (80.7 kg)  09/10/16 178 lb (80.7 kg)   Constitutional: normal weight,  in NAD Eyes: PERRLA, EOMI, no exophthalmos ENT: moist mucous membranes, no thyromegaly, no cervical lymphadenopathy Cardiovascular: RRR, No MRG Respiratory: CTA B Gastrointestinal: abdomen soft, NT, ND, BS+ Musculoskeletal: no deformities, strength intact in all 4 Skin: moist, warm, no rashes Neurological: no tremor with outstretched hands, DTR normal in all 4  ASSESSMENT: 1. DM2 or 1, insulin-dependent, uncontrolled, without complications - He had to stop Metformin b/c GI sxs >> will keep off as sugars are at or close to goal  PLAN:  1. Patient with long-standing, fairly controlled diabetes, on basal-bolus insulin regimen, with good control, execpt occasional mild low CBGs especially after meals. He was supposed to use an ICR of 1:15, but using a 1:5 >> will increase to 1:8 and in the meantime, will increase Levemir to help with his higher sugars in am.  - I suggested to:  Patient Instructions  Please increase: - Levemir to 36 units at bedtime  Change: - Novolog mealtime ICR 1:8 units 3x a day 15 min before a meal  Continue: - Novolog sliding scale as follows:  - 150-175: + 1 unit  - 176-200: + 2 units  - 201-225: + 3 units  - 226-250: + 4 units  - >250: + 5 units  If you start exercising within 2h after a meal, reduce the insulin with that meal by 25-50%.  Please return in 3-4 months with your sugar log.   - today, HbA1c is 7.1% >> will not check a fructosamine although this is usually lower for him than the measured HbA1c - continue checking sugars at different times of the day - check 3-4x a day, rotating checks - he does a great job with this - advised for yearly eye exams >> he is UTD - Return to clinic in 4 mo with sugar log    [name], MD PhD [facility] Endocrinology

## 2017-01-10 NOTE — Addendum Note (Signed)
Addended by: Darene Lamer T on: 01/10/2017 10:39 AM   Modules accepted: Orders

## 2017-01-11 ENCOUNTER — Ambulatory Visit (INDEPENDENT_AMBULATORY_CARE_PROVIDER_SITE_OTHER): Payer: PPO | Admitting: Family

## 2017-01-11 ENCOUNTER — Encounter: Payer: Self-pay | Admitting: Family

## 2017-01-11 VITALS — BP 118/70 | HR 53 | Temp 98.0°F | Resp 16 | Ht 69.0 in | Wt 178.6 lb

## 2017-01-11 DIAGNOSIS — E1165 Type 2 diabetes mellitus with hyperglycemia: Secondary | ICD-10-CM

## 2017-01-11 DIAGNOSIS — Z794 Long term (current) use of insulin: Secondary | ICD-10-CM

## 2017-01-11 DIAGNOSIS — E785 Hyperlipidemia, unspecified: Secondary | ICD-10-CM

## 2017-01-11 DIAGNOSIS — I10 Essential (primary) hypertension: Secondary | ICD-10-CM

## 2017-01-11 LAB — LIPID PANEL
Cholesterol: 127 mg/dL (ref 0–200)
HDL: 46.1 mg/dL (ref >39.00)
LDL Cholesterol: 69 mg/dL (ref 0–99)
NonHDL: 81.39
Total CHOL/HDL Ratio: 3
Triglycerides: 63 mg/dL (ref 0.0–149.0)
VLDL: 12.6 mg/dL (ref 0.0–40.0)

## 2017-01-11 NOTE — Assessment & Plan Note (Signed)
A1C stable- management per endo.

## 2017-01-11 NOTE — Progress Notes (Signed)
Subjective:    Patient ID: Ryan [name], male    DOB: 11-Apr-1946, 71 y.o.   MRN: [id number]  HPI  Ryan [name] is a 71 yr old male who presents today for follow up.  1) HTN- maintained on metoprolol, lisinopril.  BP Readings from Last 3 Encounters:  01/11/17 118/70  01/10/17 130/78  11/22/16 136/84   2) Hyperlipidemia- maintained on atorvastatin.   Lab Results  Component Value Date   CHOL 147 09/17/2015   HDL 46.50 09/17/2015   LDLCALC 81 09/17/2015   TRIG 96.0 09/17/2015   CHOLHDL 3 09/17/2015    3) DM2- patient is following with Endocrinology.  Lab Results  Component Value Date   HGBA1C 7.1 01/10/2017   HGBA1C 7.0 09/10/2016   HGBA1C 7.9 (H) 01/27/2016   Lab Results  Component Value Date   MICROALBUR <0.7 02/04/2015   LDLCALC 81 09/17/2015   CREATININE 0.99 02/11/2016     Review of Systems  Respiratory: Negative for shortness of breath.   Cardiovascular: Negative for chest pain and leg swelling.   Past Medical History:  Diagnosis Date  . Allergy   . Diabetes mellitus   . GERD (gastroesophageal reflux disease)   . History of chicken pox   . Hyperlipidemia   . Hypertension   . Type 2 diabetes mellitus with hyperglycemia, with long-term current use of insulin (HCC) 09/08/2015     Social History   Social History  . Marital status: Married    Spouse name: N/A  . Number of children: 1  . Years of education: N/A   Occupational History  . Not on file.   Social History Main Topics  . Smoking status: Former Smoker    Types: Cigarettes    Quit date: 09/01/1965  . Smokeless tobacco: Never Used  . Alcohol use No  . Drug use: No  . Sexual activity: No   Other Topics Concern  . Not on file   Social History Narrative   Regular exercise:  5 x weekly   Caffeine Use: 1 cups coffee daily.   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   Married- One son and grandson- both live with them   Wife has ovarian cancer             Past Surgical History:  Procedure  Laterality Date  . EYE SURGERY  1998   bilateral  . SHOULDER SURGERY  1990   right shoulder, torn rotator cuff.    Family History  Problem Relation Age of Onset  . Cancer Mother        cancer  . Heart disease Mother   . Hyperlipidemia Mother   . Hypertension Mother   . Hyperlipidemia Father   . Stroke Father   . Hypertension Father   . Diabetes Paternal Grandfather     No Known Allergies  Current Outpatient Prescriptions on File Prior to Visit  Medication Sig Dispense Refill  . ACCU-CHEK AVIVA PLUS test strip 1 each by Other route See admin instructions. Check blood sugars 6 times daily    . ACCU-CHEK SOFTCLIX LANCETS lancets 1 each by Other route See admin instructions. Check blood sugar 6 times daily    . aspirin 81 MG tablet Take 81 mg by mouth daily.      Marland Kitchen atorvastatin (LIPITOR) 40 MG tablet TAKE 1 TABLET (40 MG TOTAL) BY MOUTH DAILY. 30 tablet 0  . Calcium Carbonate-Vitamin D (CALTRATE 600+D) 600-400 MG-UNIT per tablet Take 1 tablet by mouth 2 (two) times daily. Reported  on 01/27/2016    . ELIQUIS 5 MG TABS tablet TAKE 1 TABLET (5 MG TOTAL) BY MOUTH 2 TIMES DAILY. 60 tablet 5  . insulin detemir (LEVEMIR) 100 UNIT/ML injection INJECT 0.36 ML (36 UNITS) INTO THE SKIN AT BEDTIME 30 mL 3  . lisinopril (PRINIVIL,ZESTRIL) 10 MG tablet TAKE 1 TABLET (10 MG) BY MOUTH DAILY. 30 tablet 0  . metoprolol succinate (TOPROL-XL) 25 MG 24 hr tablet TAKE 1 TABLET BY MOUTH DAILY 30 tablet 0  . NOVOLOG 100 UNIT/ML injection INJECT 22-25 UNITS INTO THE SKIN 3 TIMES DAILY WITH MEALS. WITH SLIDING SCALE. "MAX 80 UNITS DAILY" 30 mL 2  . ULTICARE INSULIN SYRINGE 30G X 5/16" 1 ML MISC USE TO INJECT INSULIN 4 TIMES DAILY. 200 each 2   No current facility-administered medications on file prior to visit.     BP 118/70 (BP Location: Right Arm, Cuff Size: Normal)   Pulse (!) 53   Temp 98 F (36.7 C) (Oral)   Resp 16   Ht 5\' 9"  (1.753 m)   Wt 178 lb 9.6 oz (81 kg)   SpO2 98%   BMI 26.37 kg/m         Objective:   Physical Exam  Constitutional: He is oriented to person, place, and time. He appears well-developed and well-nourished. No distress.  HENT:  Head: Normocephalic and atraumatic.  Cardiovascular: Normal rate and regular rhythm.   No murmur heard. Pulmonary/Chest: Effort normal and breath sounds normal. No respiratory distress. He has no wheezes. He has no rales.  Musculoskeletal: He exhibits no edema.  Neurological: He is alert and oriented to person, place, and time.  Skin: Skin is warm and dry.  Psychiatric: He has a normal mood and affect. His behavior is normal. Thought content normal.          Assessment & Plan:

## 2017-01-11 NOTE — Assessment & Plan Note (Signed)
Stable on statin, obtain follow up lipid panel.

## 2017-01-11 NOTE — Assessment & Plan Note (Signed)
bp stable on current meds.  Had cmet drawn yesterday which is reviewed and is stable.  Continue current meds.

## 2017-01-20 ENCOUNTER — Other Ambulatory Visit: Payer: Self-pay | Admitting: Family

## 2017-01-20 MED FILL — METOPROLOL SUCC ER 25 MG TA: 25 | 30 days supply | Qty: 30 | Fill #0

## 2017-01-20 MED FILL — ATORVASTATIN 40 MG TABLET: 40 | 30 days supply | Qty: 30 | Fill #0

## 2017-01-20 MED FILL — LISINOPRIL 10 MG TABLET: 10 | 30 days supply | Qty: 30 | Fill #0

## 2017-01-31 MED FILL — ELIQUIS 5 MG TABLET: 5 | 30 days supply | Qty: 60 | Fill #4

## 2017-01-31 MED FILL — NovoLOG 100 UNIT/ML SOLN: 100 | 38 days supply | Qty: 30 | Fill #2

## 2017-02-16 MED FILL — ATORVASTATIN 40 MG TABLET: 40 | 30 days supply | Qty: 30 | Fill #1

## 2017-02-16 MED FILL — LISINOPRIL 10 MG TABLET: 10 | 30 days supply | Qty: 30 | Fill #1

## 2017-02-16 MED FILL — METOPROLOL SUCC ER 25 MG TA: 25 | 30 days supply | Qty: 30 | Fill #1

## 2017-02-16 MED FILL — LEVEMIR 100 UNITS/ML VIAL: 100 | 54 days supply | Qty: 20 | Fill #1

## 2017-03-03 MED FILL — ELIQUIS 5 MG TABLET: 5 | 30 days supply | Qty: 60 | Fill #5

## 2017-03-10 ENCOUNTER — Other Ambulatory Visit: Payer: Self-pay | Admitting: Internal Medicine

## 2017-03-10 MED FILL — NovoLOG 100 UNIT/ML SOLN: 100 | 38 days supply | Qty: 30 | Fill #0

## 2017-03-22 MED FILL — LISINOPRIL 10 MG TABLET: 10 | 30 days supply | Qty: 30 | Fill #2

## 2017-03-22 MED FILL — ATORVASTATIN 40 MG TABLET: 40 | 30 days supply | Qty: 30 | Fill #2

## 2017-03-22 MED FILL — METOPROLOL SUCC ER 25 MG TA: 25 | 30 days supply | Qty: 30 | Fill #2

## 2017-03-31 ENCOUNTER — Other Ambulatory Visit: Payer: Self-pay | Admitting: Internal Medicine

## 2017-03-31 MED FILL — LEVEMIR 100 UNITS/ML VIAL: 100 | 54 days supply | Qty: 20 | Fill #0

## 2017-04-01 MED FILL — ELIQUIS 5 MG TABLET: 5 | 30 days supply | Qty: 60 | Fill #0

## 2017-04-01 NOTE — Telephone Encounter (Signed)
Age 71 years Wt 81kg 06/05/ 2018 Saw Dr Tenny Craw 05/24/2016 Has appt to see Wende Bushy on Monday August 27th  Will request cbc and bmet to be done on that visit 05/24/2016 Hgb 14.1 HCT 43.1  02/11/2016 SrCr 0.99 Refill done as requested Eliquis 5mg  q 12 hours

## 2017-04-04 ENCOUNTER — Encounter: Payer: Self-pay | Admitting: Physician Assistant

## 2017-04-04 ENCOUNTER — Ambulatory Visit (INDEPENDENT_AMBULATORY_CARE_PROVIDER_SITE_OTHER): Payer: PPO | Admitting: Physician Assistant

## 2017-04-04 VITALS — BP 120/62 | HR 60 | Ht 69.0 in | Wt 178.0 lb

## 2017-04-04 DIAGNOSIS — E785 Hyperlipidemia, unspecified: Secondary | ICD-10-CM | POA: Diagnosis not present

## 2017-04-04 DIAGNOSIS — I10 Essential (primary) hypertension: Secondary | ICD-10-CM | POA: Diagnosis not present

## 2017-04-04 DIAGNOSIS — I4892 Unspecified atrial flutter: Secondary | ICD-10-CM

## 2017-04-04 DIAGNOSIS — Z794 Long term (current) use of insulin: Secondary | ICD-10-CM

## 2017-04-04 DIAGNOSIS — E1165 Type 2 diabetes mellitus with hyperglycemia: Secondary | ICD-10-CM

## 2017-04-04 NOTE — Patient Instructions (Signed)
Medication Instructions:  Your physician recommends that you continue on your current medications as directed. Please refer to the Current Medication list given to you today.   Labwork: Your physician recommends that you have lab work today: BMET/CBC   Testing/Procedures: -None  Follow-Up: Your physician wants you to follow-up in: 6 months with Dr. Tenny Craw.  You will receive a reminder letter in the mail two months in advance. If you don't receive a letter, please call our office to schedule the follow-up appointment.   Any Other Special Instructions Will Be Listed Below (If Applicable).     If you need a refill on your cardiac medications before your next appointment, please call your pharmacy.

## 2017-04-04 NOTE — Progress Notes (Signed)
Cardiology Office Note    Date:  04/04/2017   ID:  RUSELL [name], DOB 1946/02/04, MRN [id number]  PCP:  [name], NP  Cardiologist: Dr. Dorris Carnes    Chief Complaint  Patient presents with  . Follow-up    History of Present Illness:  Ryan [name] is a 71 y.o. male with history of atrial flutter with 2 to one block in the setting of nausea and vomiting. He was treated with Cardizem and amiodarone and converted to normal sinus rhythm. NSTEMI felt secondary to demand ischemia with Troponin of 1.66 but he had no chest pain. Echo showed LVEF 55-60% with mild MR LA mildly dilated. Amiodarone was stopped in 02/2016. Normal Lexi scan 03/11/16.  I saw the patient in 03/2016 he was doing well. Last seen by Dr. [name] 05/24/16. She recommended keeping him on Eliquis.  He comes in today for follow-up. He brings the list of blood pressure readings and heart rates. They are all stable. He has been doing well without chest pain, palpitations, dyspnea, dyspnea on exertion, dizziness or presyncope. He takes care of his 2 grandchildren and does not exercise much. Says he can't get motivated. Recent lipid profile in June was excellent.     Past Medical History:  Diagnosis Date  . Allergy   . Diabetes mellitus   . GERD (gastroesophageal reflux disease)   . History of chicken pox   . Hyperlipidemia   . Hypertension   . Type 2 diabetes mellitus with hyperglycemia, with long-term current use of insulin ([place]) 09/08/2015    Past Surgical History:  Procedure Laterality Date  . EYE SURGERY  1998   bilateral  . SHOULDER SURGERY  1990   right shoulder, torn rotator cuff.    Current Medications: Current Meds  Medication Sig  . ACCU-CHEK AVIVA PLUS test strip 1 each by Other route See admin instructions. Check blood sugars 6 times daily  . ACCU-CHEK SOFTCLIX LANCETS lancets 1 each by Other route See admin instructions. Check blood sugar 6 times daily  . aspirin 81 MG tablet Take 81 mg by  mouth daily.    Marland Kitchen atorvastatin (LIPITOR) 40 MG tablet Take 1 tablet (40 mg total) by mouth daily.  . Calcium Carbonate-Vitamin D (CALTRATE 600+D) 600-400 MG-UNIT per tablet Take 1 tablet by mouth 2 (two) times daily. Reported on 01/27/2016  . ELIQUIS 5 MG TABS tablet TAKE 1 TABLET (5 MG TOTAL) BY MOUTH 2 TIMES DAILY.  Marland Kitchen LEVEMIR 100 UNIT/ML injection INJECT 0.37 ML (37 UNITS) INTO THE SKIN AT BEDTIME  . lisinopril (PRINIVIL,ZESTRIL) 10 MG tablet Take 1 tablet (10 mg total) by mouth daily.  . metoprolol succinate (TOPROL-XL) 25 MG 24 hr tablet Take 1 tablet (25 mg total) by mouth daily.  Marland Kitchen NOVOLOG 100 UNIT/ML injection INJECT 22-25 UNITS INTO THE SKIN 3 TIMES DAILY WITH MEALS. WITH SLIDING SCALE. "MAX 80 UNITS DAILY"  . ULTICARE INSULIN SYRINGE 30G X 5/16" 1 ML MISC USE TO INJECT INSULIN 4 TIMES DAILY.  . [DISCONTINUED] insulin detemir (LEVEMIR) 100 UNIT/ML injection INJECT 0.36 ML (36 UNITS) INTO THE SKIN AT BEDTIME     Allergies:   Patient has no known allergies.   Social History   Social History  . Marital status: Married    Spouse name: N/A  . Number of children: 1  . Years of education: N/A   Social History Main Topics  . Smoking status: Former Smoker    Types: Cigarettes    Quit date: 09/01/1965  .  Smokeless tobacco: Never Used  . Alcohol use No  . Drug use: No  . Sexual activity: No   Other Topics Concern  . None   Social History Narrative   Regular exercise:  5 x weekly   Caffeine Use: 1 cups coffee daily.   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   Married- One son and grandson- both live with them   Wife has ovarian cancer              Family History:  The patient's family history includes Cancer in his mother; Diabetes in his paternal grandfather; Heart disease in his mother; Hyperlipidemia in his father and mother; Hypertension in his father and mother; Stroke in his father.   ROS:   Please see the history of present illness.    Review of Systems  Constitution:  Negative.  HENT: Negative.   Cardiovascular: Negative.   Respiratory: Negative.   Endocrine: Negative.   Hematologic/Lymphatic: Negative.   Musculoskeletal: Negative.   Gastrointestinal: Negative.   Genitourinary: Negative.   Neurological: Negative.    All other systems reviewed and are negative.   PHYSICAL EXAM:   VS:  BP 120/62   Pulse 60   Ht '5\' 9"'$  (1.753 m)   Wt 178 lb (80.7 kg)   BMI 26.29 kg/m   Physical Exam  GEN: Well nourished, well developed, in no acute distress  Neck: no JVD, carotid bruits, or masses Cardiac:RRR; no murmurs, rubs, or gallops  Respiratory:  clear to auscultation bilaterally, normal work of breathing GI: soft, nontender, nondistended, + BS Ext: without cyanosis, clubbing, or edema, Good distal pulses bilaterally Neuro:  Alert and Oriented x 3 Psych: euthymic mood, full affect  Wt Readings from Last 3 Encounters:  04/04/17 178 lb (80.7 kg)  01/11/17 178 lb 9.6 oz (81 kg)  01/10/17 178 lb (80.7 kg)      Studies/Labs Reviewed:   EKG:  EKG is not ordered today.     Recent Labs: 05/24/2016: Hemoglobin 14.1; Platelets 224   Lipid Panel    Component Value Date/Time   CHOL 127 01/11/2017 0915   TRIG 63.0 01/11/2017 0915   HDL 46.10 01/11/2017 0915   CHOLHDL 3 01/11/2017 0915   VLDL 12.6 01/11/2017 0915   LDLCALC 69 01/11/2017 0915    Additional studies/ records that were reviewed today include:   Lexi scan Myoview 03/11/16     Perfusion Summary Normal resting and stress perfusion. No ischemia or infarction EF 55%  Overall Study Impression Myocardial perfusion is normal. The study is normal. This is a low risk study. Overall left ventricular systolic function was normal. LV cavity size is normal. Nuclear stress EF: 55%. The left ventricular ejection fraction is normal (55-65%). There is no prior study for comparison.        ASSESSMENT:    1. Atrial flutter, unspecified type ([place])   2. Essential hypertension   3. Hyperlipidemia,  unspecified hyperlipidemia type   4. Type 2 diabetes mellitus with hyperglycemia, with long-term current use of insulin ([facility])      PLAN:  In order of problems listed above:  Atrial flutter converted to normal sinus rhythm and maintaining normal sinus rhythm on low-dose metoprolol and Eliquis. Check CBC and BMET today. Follow-up with Dr. [name] in 6 months  Essential hypertension well controlled on low-dose lisinopril  Hyperlipidemia on Lipitor recent lipid profile was excellent.  Diabetes mellitus type 2 managed by primary care.    Medication Adjustments/Labs and Tests Ordered: Current medicines are reviewed at  length with the patient today.  Concerns regarding medicines are outlined above.  Medication changes, Labs and Tests ordered today are listed in the Patient Instructions below. Patient Instructions  Medication Instructions:  Your physician recommends that you continue on your current medications as directed. Please refer to the Current Medication list given to you today.   Labwork: Your physician recommends that you have lab work today: BMET/CBC   Testing/Procedures: -None  Follow-Up: Your physician wants you to follow-up in: 6 months with Dr. [name].  You will receive a reminder letter in the mail two months in advance. If you don't receive a letter, please call our office to schedule the follow-up appointment.   Any Other Special Instructions Will Be Listed Below (If Applicable).     If you need a refill on your cardiac medications before your next appointment, please call your pharmacy.      Sumner Boast, PA-C  04/04/2017 2:43 PM    [facility] [address], [place], [place]  [address] Phone: [phone]; Fax: [phone]

## 2017-04-05 LAB — BASIC METABOLIC PANEL
BUN/Creatinine Ratio: 20 (ref 10–24)
BUN: 22 mg/dL (ref 8–27)
CO2: 23 mmol/L (ref 20–29)
Calcium: 9.9 mg/dL (ref 8.6–10.2)
Chloride: 101 mmol/L (ref 96–106)
Creatinine, Ser: 1.09 mg/dL (ref 0.76–1.27)
GFR calc Af Amer: 79 mL/min/{1.73_m2} (ref >59)
GFR calc non Af Amer: 68 mL/min/{1.73_m2} (ref >59)
Glucose: 43 mg/dL — ABNORMAL LOW (ref 65–99)
Potassium: 4.2 mmol/L (ref 3.5–5.2)
Sodium: 141 mmol/L (ref 134–144)

## 2017-04-05 LAB — CBC WITH DIFFERENTIAL/PLATELET
Basophils Absolute: 0 10*3/uL (ref 0.0–0.2)
Basos: 0 %
EOS (ABSOLUTE): 0.2 10*3/uL (ref 0.0–0.4)
Eos: 2 %
Hematocrit: 44.2 % (ref 37.5–51.0)
Hemoglobin: 15 g/dL (ref 13.0–17.7)
Immature Grans (Abs): 0 10*3/uL (ref 0.0–0.1)
Immature Granulocytes: 0 %
Lymphocytes Absolute: 2.2 10*3/uL (ref 0.7–3.1)
Lymphs: 24 %
MCH: 30.5 pg (ref 26.6–33.0)
MCHC: 33.9 g/dL (ref 31.5–35.7)
MCV: 90 fL (ref 79–97)
Monocytes Absolute: 1.4 10*3/uL — ABNORMAL HIGH (ref 0.1–0.9)
Monocytes: 15 %
Neutrophils Absolute: 5.4 10*3/uL (ref 1.4–7.0)
Neutrophils: 59 %
Platelets: 274 10*3/uL (ref 150–379)
RBC: 4.91 x10E6/uL (ref 4.14–5.80)
RDW: 13.4 % (ref 12.3–15.4)
WBC: 9.2 10*3/uL (ref 3.4–10.8)

## 2017-04-15 MED FILL — NovoLOG 100 UNIT/ML SOLN: 100 | 38 days supply | Qty: 30 | Fill #1

## 2017-04-21 MED FILL — ATORVASTATIN 40 MG TABLET: 40 | 30 days supply | Qty: 30 | Fill #3

## 2017-04-21 MED FILL — METOPROLOL SUCC ER 25 MG TA: 25 | 30 days supply | Qty: 30 | Fill #3

## 2017-04-21 MED FILL — LISINOPRIL 10 MG TABLET: 10 | 30 days supply | Qty: 30 | Fill #3

## 2017-05-05 MED FILL — ELIQUIS 5 MG TABLET: 5 | 30 days supply | Qty: 60 | Fill #1

## 2017-05-12 ENCOUNTER — Ambulatory Visit (INDEPENDENT_AMBULATORY_CARE_PROVIDER_SITE_OTHER): Payer: PPO | Admitting: Internal Medicine

## 2017-05-12 ENCOUNTER — Encounter: Payer: Self-pay | Admitting: Internal Medicine

## 2017-05-12 VITALS — BP 120/72 | HR 55 | Ht 68.0 in | Wt 180.0 lb

## 2017-05-12 DIAGNOSIS — Z23 Encounter for immunization: Secondary | ICD-10-CM

## 2017-05-12 DIAGNOSIS — Z794 Long term (current) use of insulin: Secondary | ICD-10-CM | POA: Diagnosis not present

## 2017-05-12 DIAGNOSIS — E1165 Type 2 diabetes mellitus with hyperglycemia: Secondary | ICD-10-CM

## 2017-05-12 DIAGNOSIS — E785 Hyperlipidemia, unspecified: Secondary | ICD-10-CM | POA: Diagnosis not present

## 2017-05-12 LAB — POCT GLYCOSYLATED HEMOGLOBIN (HGB A1C): Hemoglobin A1C: 6.4

## 2017-05-12 MED ORDER — FREESTYLE LIBRE SENSOR SYSTEM MISC: 1.0000 | 11 refills | Status: DC

## 2017-05-12 MED ORDER — FREESTYLE LIBRE READER DEVI: 1.0000 | Freq: Three times a day (TID) | 1 refills | Status: DC

## 2017-05-12 MED ORDER — INSULIN ASPART 100 UNIT/ML ~~LOC~~ SOLN: SUBCUTANEOUS | 5 refills | Status: DC

## 2017-05-12 MED ORDER — INSULIN DETEMIR 100 UNIT/ML ~~LOC~~ SOLN: SUBCUTANEOUS | 1 refills | Status: DC

## 2017-05-12 MED FILL — FREESTYLE LIBRE SENSOR SYST: 30 days supply | Qty: 3 | Fill #0

## 2017-05-12 MED FILL — FREESTYLE LIBRE READER DEVI: 30 days supply | Qty: 1 | Fill #0

## 2017-05-12 NOTE — Patient Instructions (Addendum)
Please decrease: - Levemir 30 units at bedtime  Please change: - Novolog mealtime ICR 1:20 units 3x a day 15 min before a meal  Please change: - Novolog sliding scale as follows:  - 150-175: + 1 unit  - 176-200: + 2 units  - 201-225: + 3 units  - 226-250: + 4 units  - >250: + 5 units  If you start exercising within 2h after a meal, reduce the insulin with that meal by 25-50%.  Please return in 4 months with your sugar log.

## 2017-05-12 NOTE — Progress Notes (Signed)
Patient ID: Ryan [name], male   DOB: [birth year], 72 y.o.   MRN: [id number]  HPI: Ryan [name] is a 71 y.o.-year-old male, returning for f/u for DM2 (?1), dx 1999, insulin-dependent since 2005, uncontrolled, without long-term complications. Last visit 4 mo ago.  Last hemoglobin A1c was: 06/10/2016: HbA1c calculated from the fructosamine is 6.45%, higher. 03/09/2016: HbA1c calculated from the fructosamine is great, at 5.9%! 01/27/2016: HbA1c calculated from fructosamine is 6.4% Lab Results  Component Value Date   HGBA1C 7.1 01/10/2017   HGBA1C 7.0 09/10/2016   HGBA1C 7.9 (H) 01/27/2016   He is now on: - Levemir 36 units at bedtime - Novolog mealtime ICR 1:15 units 3x a day 15 min before a meal - Novolog sliding scale as follows:  - 150-175: + 1 unit  - 176-200: + 2 units  - 201-225: + 3 units  - 226-250: + 4 units  - >250: + 5 units Was on Metformin 1000 mg with dinner (added 08/2015 >> now off) He was on an insulin pump after dx. He was taken off the pump ~2005 by the [place] as this was not covered anymore.   Pt checks his sugars 6-8 times a day and we reviewed his exemplary log: - am: 83-, 118-190, 210 >> 83-179, 185, 180 >> 71-146, 162 - 2h after b'fast 70-163 >> 70-167 >> 70-150 >> 70-129, 154 - before lunch: 70-124 >> 70-135, 156 >> 70-152 >> 70-133, 146 - 2h after lunch:70-152 >> 70-160 >> 75-126 >> 70-129 - before dinner: 70-135, 150 >> 77-135 >> 74-115 >> 70-130 - 2h after dinner: 72-175 >> 74-150, 160 >> 70-166 >> 71-138 - bedtime:   70-162, 200 >> 70-166 >> 70-180 >> 70-143, 150 - nighttime: 72-167, 200 >> 75-179 >> 50 x1, 70-107 >> 80-126, 139 Lowest sugar was 50 x 1 at night (small dinner) >> 43 on 04/04/2017 at [facility] check - did not feel it. he has hypoglycemia awareness at 60 usually. Highest sugar was  180 >> 165  Pt's meals are: - Breakfast: egg beaters + toast - Lunch: cold cuts or salads - Dinner: meat + veggies - Snacks: fruit  He exercises daily: strength  Exercises, walks on treadmill.  - No CKD, last BUN/creatinine:  Lab Results  Component Value Date   BUN 22 04/04/2017   CREATININE 1.09 04/04/2017  On Lisinopril - last set of lipids: Lab Results  Component Value Date   CHOL 127 01/11/2017   HDL 46.10 01/11/2017   LDLCALC 69 01/11/2017   TRIG 63.0 01/11/2017   CHOLHDL 3 01/11/2017  On Pravastatin - last eye exam was in 05/2016 >> No DR - no  numbness and tingling in his feet.   In 2016, he had 3 deaths in the family within 4 months: His mother, his father, and his wife (ovarian cancer). He is taking care of his 19 year old grandson. His son and his daughter in law are both on disability for illness.   He has  A flutter  >> saw Dr. Tenny Craw >> stress test - normal. He is on Eliquis.  ROS: Constitutional: no weight gain/no weight loss, no fatigue, no subjective hyperthermia, no subjective hypothermia Eyes: no blurry vision, no xerophthalmia ENT: no sore throat, no nodules palpated in throat, no dysphagia, no odynophagia, no hoarseness Cardiovascular: no CP/no SOB/no palpitations/no leg swelling Respiratory: no cough/no SOB/no wheezing Gastrointestinal: no N/no V/no D/no C/no acid reflux Musculoskeletal: no muscle aches/no joint aches Skin: no rashes, no hair loss Neurological: no tremors/no numbness/no  tingling/no dizziness  I reviewed pt's medications, allergies, PMH, social hx, family hx, and changes were documented in the history of present illness. Otherwise, unchanged from my initial visit note.  PE: BP 120/72 (BP Location: Left Arm, Patient Position: Sitting)   Pulse (!) 55   Ht 5\' 8"  (1.727 m)   Wt 180 lb (81.6 kg)   SpO2 97%   BMI 27.37 kg/m  Body mass index is 27.37 kg/m.    Wt Readings from Last 3 Encounters:  05/12/17 180 lb (81.6 kg)  04/04/17 178 lb (80.7 kg)  01/11/17 178 lb 9.6 oz (81 kg)   Constitutional: normal weight, in NAD Eyes: PERRLA, EOMI, no exophthalmos ENT: moist mucous membranes, no  thyromegaly, no cervical lymphadenopathy Cardiovascular: RRR, No MRG Respiratory: CTA B Gastrointestinal: abdomen soft, NT, ND, BS+ Musculoskeletal: no deformities, strength intact in all 4 Skin: moist, warm, no rashes Neurological: no tremor with outstretched hands, DTR normal in all 4  ASSESSMENT: 1. DM2 or 1, insulin-dependent, fairly well controlled, without long-term complications, but with occasional hypo and hyper-glycemia - He had to stop Metformin b/c GI sxs >> will keep off as sugars are at or close to goal  2. HL  PLAN:  1. Patient with long-standing, well controlled diabetes on a basal-bolus insulin regimen, with occasional slightly high blood sugars but also with a low blood sugar at 43 at the time of his blood draw in the cardiologist's office. At that time, he does not remember feeling low, And does not have sugars in his log at home that are this low. He is checking his sugars 8 times a day usually and brings a very good log, which we reviewed together. His sugars maybe slightly high in the morning, however, they are mostly in the 70s to 100, especially after meals. For this reason, we'll reduce his mealtime insulin dose from an insulin to carb ratio of 1-15 to 1-20. I will also advised him to decrease his long-acting insulin to avoid further hypoglycemia episodes, especially, since he is on a low carb diet - I suggested to:  Patient Instructions  Please decrease: - Levemir 30 units at bedtime  Please change: - Novolog mealtime ICR 1:20 units 3x a day 15 min before a meal  Please change: - Novolog sliding scale as follows:  - 150-175: + 1 unit  - 176-200: + 2 units  - 201-225: + 3 units  - 226-250: + 4 units  - >250: + 5 units  If you start exercising within 2h after a meal, reduce the insulin with that meal by 25-50%.  Please return in 4 months with your sugar log.   - today, HbA1c is 6.4% (better) - we will not check a fructosamine today - continue checking  sugars at different times of the day - check 3x a day, rotating checks >> I suggested the FreeStyle libre CGM and was sent a prescription to his pharmacy (he has [facility]) - advised for yearly eye exams >> he is UTD And has another one scheduled - Return to clinic in 4 mo with sugar log    2. HL - great control per review of last lipid panel - On Pravachol - no SEs  Carlus Pavlov, MD PhD [facility] Endocrinology

## 2017-05-18 MED FILL — NovoLOG 100 UNIT/ML SOLN: 100 | 38 days supply | Qty: 30 | Fill #2

## 2017-05-20 MED FILL — LISINOPRIL 10 MG TABLET: 10 | 30 days supply | Qty: 30 | Fill #4

## 2017-05-20 MED FILL — METOPROLOL SUCC ER 25 MG TA: 25 | 30 days supply | Qty: 30 | Fill #4

## 2017-05-20 MED FILL — LEVEMIR 100 UNITS/ML VIAL: 100 | 54 days supply | Qty: 20 | Fill #1

## 2017-05-20 MED FILL — ATORVASTATIN 40 MG TABLET: 40 | 30 days supply | Qty: 30 | Fill #4

## 2017-05-24 ENCOUNTER — Telehealth: Payer: Self-pay | Admitting: Internal Medicine

## 2017-05-24 ENCOUNTER — Telehealth: Payer: Self-pay

## 2017-05-24 NOTE — Telephone Encounter (Signed)
Patient want to know what is his b/s target is, his free style Josephine Igo is asking him that question, please advise

## 2017-05-24 NOTE — Telephone Encounter (Signed)
Please advise, so I can let patient know? Thank you!

## 2017-05-24 NOTE — Telephone Encounter (Signed)
Called patient and advised of the note from MD. Patient understood and would call back with any questions. 

## 2017-05-24 NOTE — Telephone Encounter (Signed)
Called patient and advised of the note from MD. Patient understood and would call back with any questions.

## 2017-05-24 NOTE — Telephone Encounter (Signed)
Between 80-180, but depends on the pre- or post-meal status.

## 2017-06-02 NOTE — Telephone Encounter (Signed)
Pt is allergic to the sensor so he cannot use it he did contact the manufacturer and they told him not to use it

## 2017-06-03 MED FILL — ELIQUIS 5 MG TABLET: 5 | 30 days supply | Qty: 60 | Fill #2

## 2017-06-03 NOTE — Telephone Encounter (Signed)
Please advise. Thanks.  

## 2017-06-22 ENCOUNTER — Other Ambulatory Visit: Payer: Self-pay | Admitting: Internal Medicine

## 2017-06-22 MED FILL — LISINOPRIL 10 MG TABLET: 10 | 30 days supply | Qty: 30 | Fill #5

## 2017-06-22 MED FILL — ATORVASTATIN 40 MG TABLET: 40 | 30 days supply | Qty: 30 | Fill #5

## 2017-06-22 MED FILL — NovoLOG 100 UNIT/ML SOLN: 100 | 38 days supply | Qty: 30 | Fill #0

## 2017-06-22 MED FILL — METOPROLOL SUCC ER 25 MG TA: 25 | 30 days supply | Qty: 30 | Fill #5

## 2017-07-04 MED FILL — ELIQUIS 5 MG TABLET: 5 | 30 days supply | Qty: 60 | Fill #3

## 2017-07-13 ENCOUNTER — Ambulatory Visit: Payer: PPO | Admitting: Family

## 2017-07-22 ENCOUNTER — Other Ambulatory Visit: Payer: Self-pay | Admitting: Family

## 2017-07-22 MED FILL — ATORVASTATIN 40 MG TABLET: 40 | 30 days supply | Qty: 30 | Fill #0

## 2017-07-22 MED FILL — LISINOPRIL 10 MG TABLET: 10 | 30 days supply | Qty: 30 | Fill #0

## 2017-07-22 MED FILL — METOPROLOL SUCC ER 25 MG TA: 25 | 30 days supply | Qty: 30 | Fill #0

## 2017-07-22 NOTE — Telephone Encounter (Signed)
30 day supply of atorvastatin, metoprolol and lisinopril. Pt was due for 6 month follow up on 07/13/17 and cancelled. Mychart message sent to pt to reschedule follow up before further refills are due.

## 2017-07-25 ENCOUNTER — Other Ambulatory Visit: Payer: Self-pay | Admitting: Internal Medicine

## 2017-07-25 MED FILL — LEVEMIR 100 UNITS/ML VIAL: 100 | 54 days supply | Qty: 20 | Fill #0

## 2017-07-28 MED FILL — NovoLOG 100 UNIT/ML SOLN: 100 | 38 days supply | Qty: 30 | Fill #1

## 2017-08-05 MED FILL — ELIQUIS 5 MG TABLET: 5 | 30 days supply | Qty: 60 | Fill #4

## 2017-08-10 ENCOUNTER — Ambulatory Visit (INDEPENDENT_AMBULATORY_CARE_PROVIDER_SITE_OTHER): Payer: PPO | Admitting: Family

## 2017-08-10 ENCOUNTER — Encounter: Payer: Self-pay | Admitting: Family

## 2017-08-10 VITALS — BP 100/57 | HR 65 | Temp 98.3°F | Resp 16 | Ht 69.0 in

## 2017-08-10 DIAGNOSIS — Z8679 Personal history of other diseases of the circulatory system: Secondary | ICD-10-CM | POA: Diagnosis not present

## 2017-08-10 DIAGNOSIS — R110 Nausea: Secondary | ICD-10-CM | POA: Diagnosis not present

## 2017-08-10 DIAGNOSIS — A084 Viral intestinal infection, unspecified: Secondary | ICD-10-CM | POA: Diagnosis not present

## 2017-08-10 DIAGNOSIS — R5383 Other fatigue: Secondary | ICD-10-CM | POA: Diagnosis not present

## 2017-08-10 DIAGNOSIS — Z794 Long term (current) use of insulin: Secondary | ICD-10-CM

## 2017-08-10 DIAGNOSIS — E118 Type 2 diabetes mellitus with unspecified complications: Secondary | ICD-10-CM | POA: Diagnosis not present

## 2017-08-10 LAB — CBC WITH DIFFERENTIAL/PLATELET
Basophils Absolute: 0 10*3/uL (ref 0.0–0.1)
Basophils Relative: 0.2 % (ref 0.0–3.0)
Eosinophils Absolute: 0 10*3/uL (ref 0.0–0.7)
Eosinophils Relative: 0 % (ref 0.0–5.0)
HCT: 46.7 % (ref 39.0–52.0)
Hemoglobin: 15.4 g/dL (ref 13.0–17.0)
Lymphocytes Relative: 8.2 % — ABNORMAL LOW (ref 12.0–46.0)
Lymphs Abs: 0.8 10*3/uL (ref 0.7–4.0)
MCHC: 33.1 g/dL (ref 30.0–36.0)
MCV: 91 fl (ref 78.0–100.0)
Monocytes Absolute: 1.8 10*3/uL — ABNORMAL HIGH (ref 0.1–1.0)
Monocytes Relative: 18 % — ABNORMAL HIGH (ref 3.0–12.0)
Neutro Abs: 7.2 10*3/uL (ref 1.4–7.7)
Neutrophils Relative %: 73.6 % (ref 43.0–77.0)
Platelets: 227 10*3/uL (ref 150.0–400.0)
RBC: 5.13 Mil/uL (ref 4.22–5.81)
RDW: 14.4 % (ref 11.5–15.5)
WBC: 9.8 10*3/uL (ref 4.0–10.5)

## 2017-08-10 LAB — URINALYSIS, ROUTINE W REFLEX MICROSCOPIC
Hgb urine dipstick: NEGATIVE
Ketones, ur: 40 — AB
Leukocytes, UA: NEGATIVE
Nitrite: NEGATIVE
RBC / HPF: NONE SEEN (ref 0–?)
Specific Gravity, Urine: >=1.03 — AB (ref 1.000–1.030)
Urine Glucose: NEGATIVE
Urobilinogen, UA: 0.2 (ref 0.0–1.0)
pH: 5.5 (ref 5.0–8.0)

## 2017-08-10 LAB — COMPREHENSIVE METABOLIC PANEL
ALT: 14 U/L (ref 0–53)
AST: 16 U/L (ref 0–37)
Albumin: 4.1 g/dL (ref 3.5–5.2)
Alkaline Phosphatase: 69 U/L (ref 39–117)
BUN: 26 mg/dL — ABNORMAL HIGH (ref 6–23)
CO2: 24 mEq/L (ref 19–32)
Calcium: 9.3 mg/dL (ref 8.4–10.5)
Chloride: 97 mEq/L (ref 96–112)
Creatinine, Ser: 1.49 mg/dL (ref 0.40–1.50)
GFR: 49.38 mL/min — ABNORMAL LOW (ref >60.00)
Glucose, Bld: 176 mg/dL — ABNORMAL HIGH (ref 70–99)
Potassium: 5 mEq/L (ref 3.5–5.1)
Sodium: 133 mEq/L — ABNORMAL LOW (ref 135–145)
Total Bilirubin: 1.2 mg/dL (ref 0.2–1.2)
Total Protein: 7.8 g/dL (ref 6.0–8.3)

## 2017-08-10 NOTE — Progress Notes (Signed)
Subjective:    Patient ID: Ryan [name], male    DOB: 26-Jan-1946, 72 y.o.   MRN: [id number]  HPI  Ryan [name] is a 72 yr old male who presents today for follow up.  He reports that he "feels terrible."  Hyperlipidemia- maintained on lipitor 40mg . Denies myalgia.   Lab Results  Component Value Date   CHOL 127 01/11/2017   HDL 46.10 01/11/2017   LDLCALC 69 01/11/2017   TRIG 63.0 01/11/2017   CHOLHDL 3 01/11/2017   HTN- maintained on toprol xl and lisinopril.   BP Readings from Last 3 Encounters:  08/10/17 (!) 100/57  05/12/17 120/72  04/04/17 120/62   DM2- on novolog/levemir- managed by endocrinology.  Lab Results  Component Value Date   HGBA1C 6.4 05/12/2017   HGBA1C 7.1 01/10/2017   HGBA1C 7.0 09/10/2016   Lab Results  Component Value Date   MICROALBUR <0.7 02/04/2015   LDLCALC 69 01/11/2017   CREATININE 1.09 04/04/2017   Reports that he has not been eating much since Sunday.  + diarrhea, mild nausea without vomiting.  + anorexia. Denies fever. Denies sore throat, mild  nasal congestion.  Denies CP or shortness of breath.    Review of Systems    see HPI  Past Medical History:  Diagnosis Date  . Allergy   . Diabetes mellitus   . GERD (gastroesophageal reflux disease)   . History of chicken pox   . Hyperlipidemia   . Hypertension   . Type 2 diabetes mellitus with hyperglycemia, with long-term current use of insulin ([facility]) 09/08/2015     Social History   Socioeconomic History  . Marital status: Married    Spouse name: Not on file  . Number of children: 1  . Years of education: Not on file  . Highest education level: Not on file  Social Needs  . Financial resource strain: Not on file  . Food insecurity - worry: Not on file  . Food insecurity - inability: Not on file  . Transportation needs - medical: Not on file  . Transportation needs - non-medical: Not on file  Occupational History  . Not on file  Tobacco Use  . Smoking status: Former Smoker   Types: Cigarettes    Last attempt to quit: 09/01/1965    Years since quitting: 51.9  . Smokeless tobacco: Never Used  Substance and Sexual Activity  . Alcohol use: No  . Drug use: No  . Sexual activity: No  Other Topics Concern  . Not on file  Social History Narrative   Regular exercise:  5 x weekly   Caffeine Use: 1 cups coffee daily.   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   Married- One son and grandson- both live with them   Wife has ovarian cancer             Past Surgical History:  Procedure Laterality Date  . EYE SURGERY  1998   bilateral  . SHOULDER SURGERY  1990   right shoulder, torn rotator cuff.    Family History  Problem Relation Age of Onset  . Cancer Mother        cancer  . Heart disease Mother   . Hyperlipidemia Mother   . Hypertension Mother   . Hyperlipidemia Father   . Stroke Father   . Hypertension Father   . Diabetes Paternal Grandfather     Not on File  Current Outpatient Medications on File Prior to Visit  Medication Sig Dispense Refill  . ACCU-CHEK  AVIVA PLUS test strip 1 each by Other route See admin instructions. Check blood sugars 6 times daily    . ACCU-CHEK SOFTCLIX LANCETS lancets 1 each by Other route See admin instructions. Check blood sugar 6 times daily    . aspirin 81 MG tablet Take 81 mg by mouth daily.      Marland Kitchen atorvastatin (LIPITOR) 40 MG tablet TAKE 1 TABLET (40 MG TOTAL) BY MOUTH DAILY. 30 tablet 0  . Calcium Carbonate-Vitamin D (CALTRATE 600+D) 600-400 MG-UNIT per tablet Take 1 tablet by mouth 2 (two) times daily. Reported on 01/27/2016    . Continuous Blood Gluc Receiver (FREESTYLE LIBRE READER) DEVI 1 Device by Does not apply route 3 (three) times daily. 1 Device 1  . Continuous Blood Gluc Sensor (FREESTYLE LIBRE SENSOR SYSTEM) MISC 1 Device by Does not apply route every 30 (thirty) days. 3 each 11  . ELIQUIS 5 MG TABS tablet TAKE 1 TABLET (5 MG TOTAL) BY MOUTH 2 TIMES DAILY. 60 tablet 5  . insulin aspart (NOVOLOG) 100 UNIT/ML  injection INJECT up to 30 units a day under skin 30 mL 5  . insulin detemir (LEVEMIR) 100 UNIT/ML injection INJECT 0.30 ML (30 UNITS) INTO THE SKIN AT BEDTIME 20 mL 1  . LEVEMIR 100 UNIT/ML injection INJECT 0.37 ML (37 UNITS) INTO THE SKIN AT BEDTIME 20 mL 1  . lisinopril (PRINIVIL,ZESTRIL) 10 MG tablet TAKE 1 TABLET (10 MG TOTAL) BY MOUTH DAILY. 30 tablet 0  . metoprolol succinate (TOPROL-XL) 25 MG 24 hr tablet TAKE 1 TABLET (25 MG TOTAL) BY MOUTH DAILY. 30 tablet 0  . NOVOLOG 100 UNIT/ML injection INJECT 22-25 UNITS INTO THE SKIN 3 TIMES DAILY WITH MEALS. WITH SLIDING SCALE. "MAX 80 UNITS DAILY" 30 mL 2  . ULTICARE INSULIN SYRINGE 30G X 5/16" 1 ML MISC USE TO INJECT INSULIN 4 TIMES DAILY. 200 each 2   No current facility-administered medications on file prior to visit.     BP (!) 100/57 (BP Location: Right Arm, Patient Position: Sitting, Cuff Size: Small)   Pulse 65   Temp 98.3 F (36.8 C) (Oral)   Resp 16   Ht 5\' 9"  (1.753 m)   SpO2 98%   BMI 26.58 kg/m    Objective:   Physical Exam  Constitutional: He is oriented to person, place, and time. He appears well-developed and well-nourished. No distress.  HENT:  Head: Normocephalic and atraumatic.  Right Ear: Tympanic membrane and ear canal normal.  Left Ear: Tympanic membrane and ear canal normal.  Mouth/Throat: No oropharyngeal exudate, posterior oropharyngeal edema or posterior oropharyngeal erythema.  Cardiovascular: Normal rate and regular rhythm.  No murmur heard. Pulmonary/Chest: Effort normal and breath sounds normal. No respiratory distress. He has no wheezes. He has no rales.  Musculoskeletal: He exhibits no edema.  Neurological: He is alert and oriented to person, place, and time.  Skin: Skin is warm and dry.  Psychiatric: He has a normal mood and affect. His behavior is normal. Thought content normal.          Assessment & Plan:  History of coronary artery disease-EKG is personally reviewed and compared to  previous EKG.  EKG notes normal sinus rhythm and appears unchanged compared to previous.  He has no cardiac complaints today.  Viral gastroenteritis-I suspect that he may have a mild viral illness which is causing him some GI upset and anorexia.  I also think he is mildly dehydrated as noted by his mild low blood pressure today.  We  discussed importance of adequate hydration and monitoring his sugars carefully.  He reports that he has had no hypoglycemia despite decreased oral intake.  He reports sugar this morning was 131.  I will check urine studies, complete metabolic panel, and CBC to further evaluate.  He is instructed to call if new or worsening symptoms go to ER if he is unable to keep down food or liquid.  He is to call Dr. Elvera Lennox his endocrinologist if sugars less than 80.  He is advised to follow-up with me in 1 week.  Hypertension-I have advised him to hold his lisinopril until his systolic pressure rises to 120.  Patient verbalizes understanding.  Diabetes type 2-current sugars are stable.  Management per endocrinology.

## 2017-08-10 NOTE — Patient Instructions (Addendum)
Hold lisinopril until your blood pressure is >120. Drink plenty of fluids and try to eat small frequent snacks to keep your blood sugar up. Call Dr. Elvera Lennox if your sugars drop below 80.  Go to the emergency room if symptoms worsen or if you are unable to keep down food or liquids. Please call the lab prior to leaving. Follow-up in 1 week.

## 2017-08-11 LAB — URINE CULTURE
MICRO NUMBER:: 90003381
Result:: NO GROWTH
SPECIMEN QUALITY:: ADEQUATE

## 2017-08-17 ENCOUNTER — Encounter: Payer: Self-pay | Admitting: Family

## 2017-08-17 ENCOUNTER — Ambulatory Visit (INDEPENDENT_AMBULATORY_CARE_PROVIDER_SITE_OTHER): Payer: PPO | Admitting: Family

## 2017-08-17 VITALS — BP 136/83 | HR 62 | Temp 97.8°F | Resp 20 | Ht 69.0 in | Wt 196.8 lb

## 2017-08-17 DIAGNOSIS — A084 Viral intestinal infection, unspecified: Secondary | ICD-10-CM | POA: Diagnosis not present

## 2017-08-17 DIAGNOSIS — I10 Essential (primary) hypertension: Secondary | ICD-10-CM | POA: Diagnosis not present

## 2017-08-17 MED ORDER — LISINOPRIL 5 MG PO TABS: 5.0000 mg | ORAL_TABLET | Freq: Every day | ORAL | 3 refills | Status: DC

## 2017-08-17 MED FILL — LISINOPRIL 5 MG TAB: 5 | 30 days supply | Qty: 30 | Fill #0

## 2017-08-17 NOTE — Progress Notes (Signed)
Subjective:    Patient ID: Ryan [name], male    DOB: 1945/08/26, 72 y.o.   MRN: [id number]  HPI  Ryan [name] is a 72 yr old male who presents today for follow up. He was seen one week ago with an acute viral gastroenteritis (diarrhea/nausea/anorexia).  Blood pressure was a bit soft and we advised him to hold lisinopril until his sbp rises >120.  Reports that he did not take lisinopril this AM due to bp reading 118/70. Lab work was fairly unremarkable at last visit.   Reports tolerating PO's. Appetite is slowly improving, sugars have been stable.   Review of Systems See HPI  Past Medical History:  Diagnosis Date  . Allergy   . Diabetes mellitus   . GERD (gastroesophageal reflux disease)   . History of chicken pox   . Hyperlipidemia   . Hypertension   . Type 2 diabetes mellitus with hyperglycemia, with long-term current use of insulin ([place]) 09/08/2015     Social History   Socioeconomic History  . Marital status: Married    Spouse name: Not on file  . Number of children: 1  . Years of education: Not on file  . Highest education level: Not on file  Social Needs  . Financial resource strain: Not on file  . Food insecurity - worry: Not on file  . Food insecurity - inability: Not on file  . Transportation needs - medical: Not on file  . Transportation needs - non-medical: Not on file  Occupational History  . Not on file  Tobacco Use  . Smoking status: Former Smoker    Types: Cigarettes    Last attempt to quit: 09/01/1965    Years since quitting: 51.9  . Smokeless tobacco: Never Used  Substance and Sexual Activity  . Alcohol use: No  . Drug use: No  . Sexual activity: No  Other Topics Concern  . Not on file  Social History Narrative   Regular exercise:  5 x weekly   Caffeine Use: 1 cups coffee daily.   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   Married- One son and grandson- both live with them   Wife has ovarian cancer             Past Surgical History:  Procedure  Laterality Date  . EYE SURGERY  1998   bilateral  . SHOULDER SURGERY  1990   right shoulder, torn rotator cuff.    Family History  Problem Relation Age of Onset  . Cancer Mother        cancer  . Heart disease Mother   . Hyperlipidemia Mother   . Hypertension Mother   . Hyperlipidemia Father   . Stroke Father   . Hypertension Father   . Diabetes Paternal Grandfather     No Known Allergies  Current Outpatient Medications on File Prior to Visit  Medication Sig Dispense Refill  . ACCU-CHEK AVIVA PLUS test strip 1 each by Other route See admin instructions. Check blood sugars 6 times daily    . ACCU-CHEK SOFTCLIX LANCETS lancets 1 each by Other route See admin instructions. Check blood sugar 6 times daily    . aspirin 81 MG tablet Take 81 mg by mouth daily.      Marland Kitchen atorvastatin (LIPITOR) 40 MG tablet TAKE 1 TABLET (40 MG TOTAL) BY MOUTH DAILY. 30 tablet 0  . Calcium Carbonate-Vitamin D (CALTRATE 600+D) 600-400 MG-UNIT per tablet Take 1 tablet by mouth 2 (two) times daily. Reported on 01/27/2016    .  Continuous Blood Gluc Receiver (FREESTYLE LIBRE READER) DEVI 1 Device by Does not apply route 3 (three) times daily. 1 Device 1  . Continuous Blood Gluc Sensor (FREESTYLE LIBRE SENSOR SYSTEM) MISC 1 Device by Does not apply route every 30 (thirty) days. 3 each 11  . ELIQUIS 5 MG TABS tablet TAKE 1 TABLET (5 MG TOTAL) BY MOUTH 2 TIMES DAILY. 60 tablet 5  . insulin aspart (NOVOLOG) 100 UNIT/ML injection INJECT up to 30 units a day under skin 30 mL 5  . insulin detemir (LEVEMIR) 100 UNIT/ML injection INJECT 0.30 ML (30 UNITS) INTO THE SKIN AT BEDTIME 20 mL 1  . LEVEMIR 100 UNIT/ML injection INJECT 0.37 ML (37 UNITS) INTO THE SKIN AT BEDTIME 20 mL 1  . metoprolol succinate (TOPROL-XL) 25 MG 24 hr tablet TAKE 1 TABLET (25 MG TOTAL) BY MOUTH DAILY. 30 tablet 0  . NOVOLOG 100 UNIT/ML injection INJECT 22-25 UNITS INTO THE SKIN 3 TIMES DAILY WITH MEALS. WITH SLIDING SCALE. "MAX 80 UNITS DAILY" 30 mL 2   . ULTICARE INSULIN SYRINGE 30G X 5/16" 1 ML MISC USE TO INJECT INSULIN 4 TIMES DAILY. 200 each 2  . lisinopril (PRINIVIL,ZESTRIL) 10 MG tablet TAKE 1 TABLET (10 MG TOTAL) BY MOUTH DAILY. (Patient not taking: Reported on 08/17/2017) 30 tablet 0   No current facility-administered medications on file prior to visit.     BP 136/83 (BP Location: Right Arm, Cuff Size: Normal)   Pulse 62   Temp 97.8 F (36.6 C) (Oral)   Resp 20   Ht 5' 9"$  (1.753 m)   Wt 196 lb 12.8 oz (89.3 kg)   SpO2 98%   BMI 29.06 kg/m       Objective:   Physical Exam  Constitutional: He is oriented to person, place, and time. He appears well-developed and well-nourished. No distress.  HENT:  Head: Normocephalic and atraumatic.  Cardiovascular: Normal rate and regular rhythm.  No murmur heard. Pulmonary/Chest: Effort normal and breath sounds normal. No respiratory distress. He has no wheezes. He has no rales.  Musculoskeletal: He exhibits no edema.  Neurological: He is alert and oriented to person, place, and time.  Skin: Skin is warm and dry.  Psychiatric: He has a normal mood and affect. His behavior is normal. Thought content normal.          Assessment & Plan:  Viral gastroenteritis- clinically improved, tolerating PO's, appetite continues to improve.  HTN- BP ok, but needs ace for renal protection. Advised pt to restart lisinopril at 77m once daily. Continue to check BP at home and let me know if bp starts to run high.

## 2017-08-17 NOTE — Addendum Note (Signed)
Addended by: Sandford Craze on: 08/17/2017 05:03 PM   Modules accepted: Level of Service

## 2017-08-17 NOTE — Patient Instructions (Addendum)
Please restart lisinopril at a lower dose (5mg  once daily) Rx has been sent to your pharmacy.  Call me if your appetite does not continue to improve.

## 2017-08-23 ENCOUNTER — Other Ambulatory Visit: Payer: Self-pay | Admitting: Family

## 2017-08-23 MED FILL — METOPROLOL SUCC ER 25 MG TA: 25 | 90 days supply | Qty: 90 | Fill #0

## 2017-08-23 MED FILL — ATORVASTATIN 40 MG TABLET: 40 | 90 days supply | Qty: 90 | Fill #0

## 2017-09-07 MED FILL — ELIQUIS 5 MG TABLET: 5 | 30 days supply | Qty: 60 | Fill #5

## 2017-09-07 MED FILL — NovoLOG 100 UNIT/ML SOLN: 100 | 38 days supply | Qty: 30 | Fill #2

## 2017-09-12 ENCOUNTER — Ambulatory Visit: Payer: PPO | Admitting: Internal Medicine

## 2017-09-12 ENCOUNTER — Encounter: Payer: Self-pay | Admitting: Internal Medicine

## 2017-09-12 VITALS — BP 120/80 | HR 64 | Ht 69.0 in | Wt 165.6 lb

## 2017-09-12 DIAGNOSIS — E1159 Type 2 diabetes mellitus with other circulatory complications: Secondary | ICD-10-CM

## 2017-09-12 DIAGNOSIS — E785 Hyperlipidemia, unspecified: Secondary | ICD-10-CM

## 2017-09-12 DIAGNOSIS — E1165 Type 2 diabetes mellitus with hyperglycemia: Secondary | ICD-10-CM

## 2017-09-12 LAB — POCT GLYCOSYLATED HEMOGLOBIN (HGB A1C): Hemoglobin A1C: 6.7

## 2017-09-12 NOTE — Patient Instructions (Signed)
Please continue: - Levemir 30 units at bedtime  Please change: - Novolog mealtime 7-9 units 3x a day 15 min before a meal - Novolog sliding scale as follows:  - 150-175: + 1 unit  - 176-200: + 2 units  - 201-225: + 3 units  - 226-250: + 4 units  - >250: + 5 units If you exercise within 2h after a meal, reduce the insulin with that meal by 25-50%.  Please return in 4 months with your sugar log.

## 2017-09-12 NOTE — Progress Notes (Signed)
Patient ID: Ryan [name], male   DOB: 09-13-1945, 72 y.o.   MRN: [id number]  HPI: Ryan [name] is a 72 y.o.-year-old male, returning for f/u for DM2 (?1), dx 1999, insulin-dependent since 2005, uncontrolled, with  long-term complications (CAD - s/p AMI 2017). Last visit 4 months ago.  Last hemoglobin A1c was: Lab Results  Component Value Date   HGBA1C 6.4 05/12/2017   HGBA1C 7.1 01/10/2017   HGBA1C 7.0 09/10/2016  06/10/2016: HbA1c calculated from the fructosamine is 6.45%, higher. 03/09/2016: HbA1c calculated from the fructosamine is great, at 5.9%! 01/27/2016: HbA1c calculated from fructosamine is 6.4%  He is now on: - Levemir 30 units at bedtime - Novolog mealtime ICR 1:9 units 3x a day 15 min before a meal - Novolog sliding scale as follows:  - 150-175: + 1 unit  - 176-200: + 2 units  - 201-225: + 3 units  - 226-250: + 4 units  - >250: + 5 units If you exercise within 2h after a meal, reduce the insulin with that meal by 25-50%. Was on Metformin 1000 mg with dinner (added 08/2015 >> now off) He was on an insulin pump after dx. He was taken off the pump ~2005 by the [place] as this was not covered anymore.   Pt checks his sugars 6-8 times a day per his exemplary log:  - am: 83-179, 185, 180 >> 71-146, 162 >> 80-140, 172 - 2h after b'fast 70-150 >> 70-129, 154 >> 71-128, 131 - before lunch: 70-152 >> 70-133, 146 >> 70-142, 146, 190 - 2h after lunch: 75-126 >> 70-129 >> 75-150, 175 - before dinner:74-115 >> 70-130 >> 75-137, 183 - 2h after dinner: 70-166 >> 71-138 >> 72-145, 180 - bedtime:  70-180 >> 70-143, 150 >> 78-150 - nighttime:  50 x1, 70-107 >> 80-126, 139 >> 80-134, 155, 168 Lowest sugar was 50 x 1 at night (small dinner) >> 43 on 04/04/2017 >> 70. he has hypoglycemia awareness 60s. Highest sugar was  180 >> 165 >> 169.  Pt's meals are: - Breakfast: egg beaters + toast - Lunch: cold cuts or salads - Dinner: meat + veggies - Snacks: fruit  He exercises daily:  Strength exercises and walking on the treadmill.  -No history of CKD, but last BUN/creatinine:  Lab Results  Component Value Date   BUN 26 (H) 08/10/2017   CREATININE 1.49 08/10/2017  On lisinopril. -+ HL; last set of lipids: Lab Results  Component Value Date   CHOL 127 01/11/2017   HDL 46.10 01/11/2017   LDLCALC 69 01/11/2017   TRIG 63.0 01/11/2017   CHOLHDL 3 01/11/2017  On pravastatin. - last eye exam was in 05/2016: No DR - no numbness and tingling in his feet.   In 2016, he had 3 deaths in the family within 4 months: His mother, his father, and his wife (ovarian cancer). He is taking care of his 72 year old grandson. His son and his daughter in law are both on disability for illness.   He has  A flutter  >> saw Dr. [name] >> stress test - normal.  He is on Eliquis.  ROS: Constitutional: no weight gain/no weight loss, no fatigue, no subjective hyperthermia, no subjective hypothermia Eyes: no blurry vision, no xerophthalmia ENT: no sore throat, no nodules palpated in throat, no dysphagia, no odynophagia, no hoarseness Cardiovascular: no CP/no SOB/no palpitations/no leg swelling Respiratory: no cough/no SOB/no wheezing Gastrointestinal: no N/no V/no D/no C/no acid reflux Musculoskeletal: no muscle aches/no joint aches Skin: no rashes,  no hair loss Neurological: no tremors/no numbness/no tingling/no dizziness  I reviewed pt's medications, allergies, PMH, social hx, family hx, and changes were documented in the history of present illness. Otherwise, unchanged from my initial visit note.  PE: BP 120/80   Pulse 64   Ht '5\' 9"'$  (1.753 m)   Wt 165 lb 9.6 oz (75.1 kg)   SpO2 97%   BMI 24.45 kg/m  Body mass index is 24.45 kg/m.    Wt Readings from Last 3 Encounters:  09/12/17 165 lb 9.6 oz (75.1 kg)  08/17/17 196 lb 12.8 oz (89.3 kg)  05/12/17 180 lb (81.6 kg)   Constitutional: normal weight, in NAD Eyes: PERRLA, EOMI, no exophthalmos ENT: moist mucous membranes, no  thyromegaly, no cervical lymphadenopathy Cardiovascular: RRR, No MRG Respiratory: CTA B Gastrointestinal: abdomen soft, NT, ND, BS+ Musculoskeletal: no deformities, strength intact in all 4 Skin: moist, warm, no rashes Neurological: no tremor with outstretched hands, DTR normal in all 4  ASSESSMENT: 1. DM2 or 1, insulin-dependent, fairly well controlled, with long-term complications, but with occasional hypo and hyper-glycemia - He had to stop Metformin b/c GI sxs >> will keep off as sugars are at or close to goal  - CAD, s/p AMI  2. HL  PLAN:  1. Patient with long-standing, well controlled, diabetes, on basal-bolus insulin regimen, with few low blood sugars at last visit (lowest 43), after which we decreased his insulin doses.  He is checking sugars consistently during the day and always brings a very good log.  We reviewed his log together again today. - sugars are great, but lower after meals >> will reduce his mealtime insulin. As he is not calculating his carbs or insulin doses >> will give him fixed doses - I suggested to:  Patient Instructions  Please continue: - Levemir 30 units at bedtime  Please change: - Novolog mealtime 7-9 units 3x a day 15 min before a meal - Novolog sliding scale as follows:  - 150-175: + 1 unit  - 176-200: + 2 units  - 201-225: + 3 units  - 226-250: + 4 units  - >250: + 5 units If you exercise within 2h after a meal, reduce the insulin with that meal by 25-50%.  Please return in 4 months with your sugar log.   - today, HbA1c is 6.7% (slightly higher) - continue checking sugars at different times of the day - check 3-4x a day, rotating checks - advised for yearly eye exams >> he is not UTD - needs a new dr. - Return to clinic in 3 mo with sugar log    2. HL -Reviewed latest lipid panel, great -On Pravachol, without side effects  [name], MD PhD [facility]

## 2017-09-12 NOTE — Addendum Note (Signed)
Addended by: Yolande Jolly on: 09/12/2017 10:52 AM   Modules accepted: Orders

## 2017-09-23 MED FILL — LEVEMIR 100 UNITS/ML VIAL: 100 | 54 days supply | Qty: 20 | Fill #1

## 2017-09-29 ENCOUNTER — Encounter: Payer: Self-pay | Admitting: Internal Medicine

## 2017-10-06 ENCOUNTER — Encounter: Payer: Self-pay | Admitting: Internal Medicine

## 2017-10-06 ENCOUNTER — Ambulatory Visit: Payer: PPO | Admitting: Internal Medicine

## 2017-10-06 VITALS — BP 130/80 | HR 64 | Ht 69.0 in | Wt 178.8 lb

## 2017-10-06 DIAGNOSIS — I4892 Unspecified atrial flutter: Secondary | ICD-10-CM | POA: Diagnosis not present

## 2017-10-06 DIAGNOSIS — D72821 Monocytosis (symptomatic): Secondary | ICD-10-CM

## 2017-10-06 DIAGNOSIS — I10 Essential (primary) hypertension: Secondary | ICD-10-CM | POA: Diagnosis not present

## 2017-10-06 DIAGNOSIS — E785 Hyperlipidemia, unspecified: Secondary | ICD-10-CM

## 2017-10-06 LAB — CBC WITH DIFFERENTIAL
Basophils Absolute: 0 10*3/uL (ref 0.0–0.2)
Basos: 0 %
EOS (ABSOLUTE): 0.2 10*3/uL (ref 0.0–0.4)
Eos: 3 %
Hematocrit: 41.3 % (ref 37.5–51.0)
Hemoglobin: 14 g/dL (ref 13.0–17.7)
Immature Grans (Abs): 0 10*3/uL (ref 0.0–0.1)
Immature Granulocytes: 0 %
Lymphocytes Absolute: 1.8 10*3/uL (ref 0.7–3.1)
Lymphs: 24 %
MCH: 29.7 pg (ref 26.6–33.0)
MCHC: 33.9 g/dL (ref 31.5–35.7)
MCV: 88 fL (ref 79–97)
Monocytes Absolute: 1.3 10*3/uL — ABNORMAL HIGH (ref 0.1–0.9)
Monocytes: 17 %
Neutrophils Absolute: 4.2 10*3/uL (ref 1.4–7.0)
Neutrophils: 56 %
RBC: 4.72 x10E6/uL (ref 4.14–5.80)
RDW: 14.3 % (ref 12.3–15.4)
WBC: 7.5 10*3/uL (ref 3.4–10.8)

## 2017-10-06 NOTE — Patient Instructions (Signed)
Your physician recommends that you continue on your current medications as directed. Please refer to the Current Medication list given to you today. Your physician recommends that you return for lab work in: today (CBC w/ diff) Your physician wants you to follow-up in: 1 year with Dr. Tenny Craw.  You will receive a reminder letter in the mail two months in advance. If you don't receive a letter, please call our office to schedule the follow-up appointment.

## 2017-10-06 NOTE — Progress Notes (Signed)
Cardiology Office Note   Date:  10/06/2017   ID:  Ryan [name], DOB Feb 03, 1946, MRN [id number]  PCP:  [name], NP  Cardiologist:   Dorris Carnes, MD   F/U of atrial flutter and HTN      History of Present Illness: Ryan [name] is a 72 y.o. male with a history of atrial flutter  Placed on Eliquis in 2017Hx of  Lexiscan in Aug 2017 was normal  Echo  In 2017 with normal LVEF   He was last seen in cardiology clinic by Gerrianne Scale in 2018  Since seen breathing good He denies Cp     No palpitations ( Never had even when in atrial flutter )  BP at home was 110s to 130s/60s to 70s       Outpatient Medications Prior to Visit  Medication Sig Dispense Refill  . ACCU-CHEK AVIVA PLUS test strip 1 each by Other route See admin instructions. Check blood sugars 6 times daily    . ACCU-CHEK SOFTCLIX LANCETS lancets 1 each by Other route See admin instructions. Check blood sugar 6 times daily    . aspirin 81 MG tablet Take 81 mg by mouth daily.      Marland Kitchen atorvastatin (LIPITOR) 40 MG tablet TAKE 1 TABLET (40 MG TOTAL) BY MOUTH DAILY. 90 tablet 0  . Calcium Carbonate-Vitamin D (CALTRATE 600+D) 600-400 MG-UNIT per tablet Take 1 tablet by mouth 2 (two) times daily. Reported on 01/27/2016    . Continuous Blood Gluc Receiver (FREESTYLE LIBRE READER) DEVI 1 Device by Does not apply route 3 (three) times daily. 1 Device 1  . Continuous Blood Gluc Sensor (FREESTYLE LIBRE SENSOR SYSTEM) MISC 1 Device by Does not apply route every 30 (thirty) days. 3 each 11  . ELIQUIS 5 MG TABS tablet TAKE 1 TABLET (5 MG TOTAL) BY MOUTH 2 TIMES DAILY. 60 tablet 5  . insulin aspart (NOVOLOG) 100 UNIT/ML injection INJECT up to 30 units a day under skin 30 mL 5  . insulin detemir (LEVEMIR) 100 UNIT/ML injection INJECT 0.30 ML (30 UNITS) INTO THE SKIN AT BEDTIME 20 mL 1  . LEVEMIR 100 UNIT/ML injection INJECT 0.37 ML (37 UNITS) INTO THE SKIN AT BEDTIME 20 mL 1  . lisinopril (PRINIVIL,ZESTRIL) 5 MG tablet Take 1 tablet (5  mg total) by mouth daily. 30 tablet 3  . metoprolol succinate (TOPROL-XL) 25 MG 24 hr tablet TAKE 1 TABLET (25 MG TOTAL) BY MOUTH DAILY. 90 tablet 0  . NOVOLOG 100 UNIT/ML injection INJECT 22-25 UNITS INTO THE SKIN 3 TIMES DAILY WITH MEALS. WITH SLIDING SCALE. "MAX 80 UNITS DAILY" 30 mL 2  . ULTICARE INSULIN SYRINGE 30G X 5/16" 1 ML MISC USE TO INJECT INSULIN 4 TIMES DAILY. 200 each 2   No facility-administered medications prior to visit.      Allergies:   Patient has no known allergies.   Past Medical History:  Diagnosis Date  . Allergy   . Diabetes mellitus   . GERD (gastroesophageal reflux disease)   . History of chicken pox   . Hyperlipidemia   . Hypertension   . Type 2 diabetes mellitus with hyperglycemia, with long-term current use of insulin ([place]) 09/08/2015    Past Surgical History:  Procedure Laterality Date  . EYE SURGERY  1998   bilateral  . SHOULDER SURGERY  1990   right shoulder, torn rotator cuff.     Social History:  The patient  reports that he quit smoking about 52 years  ago. His smoking use included cigarettes. he has never used smokeless tobacco. He reports that he does not drink alcohol or use drugs.   Family History:  The patient's family history includes Cancer in his mother; Diabetes in his paternal grandfather; Heart disease in his mother; Hyperlipidemia in his father and mother; Hypertension in his father and mother; Stroke in his father.    ROS:  Please see the history of present illness. All other systems are reviewed and  Negative to the above problem except as noted.    PHYSICAL EXAM: VS:  BP 130/80   Pulse 64   Ht '5\' 9"'$  (1.753 m)   Wt 178 lb 12.8 oz (81.1 kg)   SpO2 97%   BMI 26.40 kg/m   GEN: Well nourished, well developed, in no acute distress HEENT: normal Neck: JVP normal  No carotid bruits, or masses Cardiac: RRR; no murmurs, rubs, or gallops,no edema  Respiratory:  clear to auscultation bilaterally, normal work of breathing GI:  soft, nontender, nondistended, + BS  No hepatomegaly  MS: no deformity Moving all extremities   Skin: warm and dry, no rash Neuro:  Strength and sensation are intact Psych: euthymic mood, full affect   EKG:  EKG is  Not ordered today.  In Jan 2019  SB 59 bpm  Normal conduction intervals    Lipid Panel    Component Value Date/Time   CHOL 127 01/11/2017 0915   TRIG 63.0 01/11/2017 0915   HDL 46.10 01/11/2017 0915   CHOLHDL 3 01/11/2017 0915   VLDL 12.6 01/11/2017 0915   LDLCALC 69 01/11/2017 0915      Wt Readings from Last 3 Encounters:  10/06/17 178 lb 12.8 oz (81.1 kg)  09/12/17 165 lb 9.6 oz (75.1 kg)  08/17/17 196 lb 12.8 oz (89.3 kg)      ASSESSMENT AND PLAN: 1  Paroxysmal atrial flutter    Clinic in [place]   EKG in Jan supports  COntinue eliquis CHADSVASC 2   Check CBC    2  HTN Great control    3  HL  Would continue lipitor  Lipids very good on recent check  4  Increaesed monocytes  Ill when CBC check ed  Will repeat   Current medicines are reviewed at length with the patient today.  The patient does not have concerns regarding medicines.  Signed, Dorris Carnes, MD  10/06/2017 11:42 AM    [facility] [place], [place], [place]  [address] Phone: [phone]; Fax: [phone]

## 2017-10-10 ENCOUNTER — Other Ambulatory Visit: Payer: Self-pay | Admitting: Internal Medicine

## 2017-10-10 MED FILL — ELIQUIS 5 MG TABLET: 5 | 30 days supply | Qty: 60 | Fill #0

## 2017-10-10 MED FILL — LISINOPRIL 5 MG TAB: 5 | 30 days supply | Qty: 30 | Fill #1

## 2017-10-13 ENCOUNTER — Other Ambulatory Visit: Payer: Self-pay | Admitting: Internal Medicine

## 2017-10-13 MED FILL — NovoLOG 100 UNIT/ML SOLN: 100 | 38 days supply | Qty: 30 | Fill #0

## 2017-11-09 MED FILL — ELIQUIS 5 MG TABLET: 5 | 30 days supply | Qty: 60 | Fill #1

## 2017-11-09 MED FILL — LISINOPRIL 5 MG TABLET: 5 | 30 days supply | Qty: 30 | Fill #2

## 2017-11-15 ENCOUNTER — Ambulatory Visit: Payer: PPO | Admitting: Family

## 2017-11-16 ENCOUNTER — Other Ambulatory Visit: Payer: Self-pay | Admitting: Internal Medicine

## 2017-11-16 MED FILL — NovoLOG 100 UNIT/ML SOLN: 100 | 38 days supply | Qty: 30 | Fill #1

## 2017-11-17 MED FILL — LEVEMIR 100 UNITS/ML VIAL: 100 | 54 days supply | Qty: 20 | Fill #0

## 2017-11-23 ENCOUNTER — Ambulatory Visit: Payer: PPO | Admitting: *Deleted

## 2017-11-24 NOTE — Progress Notes (Signed)
Subjective:   Ryan [name] is a 72 y.o. male who presents for Medicare Annual/Subsequent preventive examination.  Review of Systems: No ROS.  Medicare Wellness Visit. Additional risk factors are reflected in the social history.  Cardiac Risk Factors include: advanced age (>76men, >35 women);diabetes mellitus;dyslipidemia;hypertension;male gender Sleep patterns: Sleep 4-5 hrs and states that is his norm.  Home Safety/Smoke Alarms: Feels safe in home. Smoke alarms in place.  Living environment; residence and Firearm Safety: Son's family lives with him.1 story home  Male:   CCS- Cologuard 02/27/15-neg    PSA-  Lab Results  Component Value Date   PSA 0.10 02/04/2015   PSA 0.16 05/25/2011   Eye- diabetic eye exam yearly. Appt in June.  Dental resource list provided.    Objective:    Vitals: BP 136/64 (BP Location: Left Arm, Patient Position: Sitting, Cuff Size: Normal)   Pulse 71   Ht 5\' 9"  (1.753 m)   Wt 179 lb 9.6 oz (81.5 kg)   SpO2 97%   BMI 26.52 kg/m   Body mass index is 26.52 kg/m.  Advanced Directives 11/29/2017 11/22/2016 01/27/2016 01/27/2016  Does Patient Have a Medical Advance Directive? No No No No  Does patient want to make changes to medical advance directive? - Yes (MAU/Ambulatory/Procedural Areas - Information given) - -  Would patient like information on creating a medical advance directive? No - Patient declined - No - patient declined information No - patient declined information    Tobacco Social History   Tobacco Use  Smoking Status Former Smoker  . Types: Cigarettes  . Last attempt to quit: 09/01/1965  . Years since quitting: 52.2  Smokeless Tobacco Never Used     Counseling given: Not Answered   Clinical Intake: Pain : No/denies pain    Past Medical History:  Diagnosis Date  . Allergy   . Diabetes mellitus   . GERD (gastroesophageal reflux disease)   . History of chicken pox   . Hyperlipidemia   . Hypertension   . Myocardial  infarction (HCC) 2017  . Type 2 diabetes mellitus with hyperglycemia, with long-term current use of insulin (HCC) 09/08/2015   Past Surgical History:  Procedure Laterality Date  . EYE SURGERY  1998   bilateral  . SHOULDER SURGERY  1990   right shoulder, torn rotator cuff.   Family History  Problem Relation Age of Onset  . Cancer Mother        cancer  . Heart disease Mother   . Hyperlipidemia Mother   . Hypertension Mother   . Hyperlipidemia Father   . Stroke Father   . Hypertension Father   . Diabetes Paternal Grandfather    Social History   Socioeconomic History  . Marital status: Married    Spouse name: Not on file  . Number of children: 1  . Years of education: Not on file  . Highest education level: Not on file  Occupational History  . Not on file  Social Needs  . Financial resource strain: Not on file  . Food insecurity:    Worry: Not on file    Inability: Not on file  . Transportation needs:    Medical: Not on file    Non-medical: Not on file  Tobacco Use  . Smoking status: Former Smoker    Types: Cigarettes    Last attempt to quit: 09/01/1965    Years since quitting: 52.2  . Smokeless tobacco: Never Used  Substance and Sexual Activity  . Alcohol use:  No  . Drug use: No  . Sexual activity: Never  Lifestyle  . Physical activity:    Days per week: Not on file    Minutes per session: Not on file  . Stress: Not on file  Relationships  . Social connections:    Talks on phone: Not on file    Gets together: Not on file    Attends religious service: Not on file    Active member of club or organization: Not on file    Attends meetings of clubs or organizations: Not on file    Relationship status: Not on file  Other Topics Concern  . Not on file  Social History Narrative   Regular exercise:  5 x weekly   Caffeine Use: 1 cups coffee daily.   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   Married- One son and grandson- both live with them   Wife has ovarian cancer              Outpatient Encounter Medications as of 11/29/2017  Medication Sig  . ACCU-CHEK AVIVA PLUS test strip 1 each by Other route See admin instructions. Check blood sugars 6 times daily  . ACCU-CHEK SOFTCLIX LANCETS lancets 1 each by Other route See admin instructions. Check blood sugar 6 times daily  . aspirin 81 MG tablet Take 81 mg by mouth daily.    Marland Kitchen atorvastatin (LIPITOR) 40 MG tablet TAKE 1 TABLET (40 MG TOTAL) BY MOUTH DAILY.  . Calcium Carbonate-Vitamin D (CALTRATE 600+D) 600-400 MG-UNIT per tablet Take 1 tablet by mouth 2 (two) times daily. Reported on 01/27/2016  . Continuous Blood Gluc Receiver (FREESTYLE LIBRE READER) DEVI 1 Device by Does not apply route 3 (three) times daily.  . Continuous Blood Gluc Sensor (FREESTYLE LIBRE SENSOR SYSTEM) MISC 1 Device by Does not apply route every 30 (thirty) days.  Marland Kitchen ELIQUIS 5 MG TABS tablet TAKE 1 TABLET (5 MG TOTAL) BY MOUTH 2 TIMES DAILY.  Marland Kitchen insulin aspart (NOVOLOG) 100 UNIT/ML injection INJECT up to 30 units a day under skin  . insulin detemir (LEVEMIR) 100 UNIT/ML injection INJECT 0.30 ML (30 UNITS) INTO THE SKIN AT BEDTIME  . lisinopril (PRINIVIL,ZESTRIL) 5 MG tablet Take 1 tablet (5 mg total) by mouth daily.  . metoprolol succinate (TOPROL-XL) 25 MG 24 hr tablet TAKE 1 TABLET (25 MG TOTAL) BY MOUTH DAILY.  Marland Kitchen NOVOLOG 100 UNIT/ML injection INJECT 22-25 UNITS INTO THE SKIN 3 TIMES DAILY WITH MEALS. WITH SLIDING SCALE. "MAX 80 UNITS DAILY"  . ULTICARE INSULIN SYRINGE 30G X 5/16" 1 ML MISC USE TO INJECT INSULIN 4 TIMES DAILY.  . [DISCONTINUED] LEVEMIR 100 UNIT/ML injection INJECT 0.37 ML (37 UNITS) INTO THE SKIN AT BEDTIME  . [DISCONTINUED] atorvastatin (LIPITOR) 40 MG tablet TAKE 1 TABLET (40 MG TOTAL) BY MOUTH DAILY.  . [DISCONTINUED] metoprolol succinate (TOPROL-XL) 25 MG 24 hr tablet TAKE 1 TABLET (25 MG TOTAL) BY MOUTH DAILY.   No facility-administered encounter medications on file as of 11/29/2017.     Activities of Daily  Living In your present state of health, do you have any difficulty performing the following activities: 11/29/2017  Hearing? N  Vision? N  Comment wears glasses.  Difficulty concentrating or making decisions? N  Walking or climbing stairs? N  Dressing or bathing? N  Doing errands, shopping? N  Preparing Food and eating ? N  Using the Toilet? N  In the past six months, have you accidently leaked urine? N  Do you have problems with loss  of bowel control? N  Managing your Medications? N  Managing your Finances? N  Housekeeping or managing your Housekeeping? N  Some recent data might be hidden    Patient Care Team: Sandford Craze, NP as PCP - General (Internal Medicine) Carlus Pavlov, MD as Consulting Physician (Endocrinology) Pricilla Riffle, MD as Consulting Physician (Cardiology)   Assessment:   This is a routine wellness examination for [employer]. Physical assessment deferred to PCP.   Exercise Activities and Dietary recommendations Current Exercise Habits: Home exercise routine, Type of exercise: treadmill, Time (Minutes): 30, Frequency (Times/Week): 7, Weekly Exercise (Minutes/Week): 210, Intensity: Mild   Diet (meal preparation, eat out, water intake, caffeinated beverages, dairy products, fruits and vegetables): in general, a "healthy" diet  , well balanced     Goals    . Weight (lb) < 175 lb (79.4 kg)       Fall Risk Fall Risk  11/29/2017 11/22/2016 02/04/2015 02/04/2015  Falls in the past year? No No No Yes  Number falls in past yr: - - - 1  Injury with Fall? - - - No   Depression Screen PHQ 2/9 Scores 11/29/2017 11/22/2016 02/04/2015 02/04/2015  PHQ - 2 Score 0 0 0 0    Cognitive Function MMSE - Mini Mental State Exam 11/29/2017 11/22/2016  Orientation to time 5 5  Orientation to Place 5 5  Registration 3 3  Attention/ Calculation 5 5  Recall 3 2  Language- name 2 objects 2 2  Language- repeat 1 1  Language- follow 3 step command 3 3  Language- read & follow  direction 1 1  Write a sentence 1 1  Copy design 1 0  Total score 30 28        Immunization History  Administered Date(s) Administered  . H1N1 11/19/2008  . Influenza Split 05/25/2011, 04/20/2012  . Influenza Whole 06/06/2009  . Influenza, High Dose Seasonal PF 04/20/2016, 05/12/2017  . Influenza,inj,Quad PF,6+ Mos 05/07/2013, 05/27/2014, 05/13/2015  . Pneumococcal Conjugate-13 10/19/2004, 05/11/2013  . Pneumococcal Polysaccharide-23 05/25/2011  . Td 08/09/2001, 08/25/2011  . Zoster 08/25/2011   Screening Tests Health Maintenance  Topic Date Due  . OPHTHALMOLOGY EXAM  05/12/2017  . FOOT EXAM  11/22/2017  . Fecal DNA (Cologuard)  02/26/2018  . INFLUENZA VACCINE  03/09/2018  . HEMOGLOBIN A1C  03/12/2018  . TETANUS/TDAP  08/24/2021  . Hepatitis C Screening  Completed  . PNA vac Low Risk Adult  Completed        Plan:   Follow up with PCP as directed  Continue to eat heart healthy diet (full of fruits, vegetables, whole grains, lean protein, water--limit salt, fat, and sugar intake) and increase physical activity as tolerated.  Continue doing brain stimulating activities (puzzles, reading, adult coloring books, staying active) to keep memory sharp.    I have personally reviewed and noted the following in the patient's chart:   . Medical and social history . Use of alcohol, tobacco or illicit drugs  . Current medications and supplements . Functional ability and status . Nutritional status . Physical activity . Advanced directives . List of other physicians . Hospitalizations, surgeries, and ER visits in previous 12 months . Vitals . Screenings to include cognitive, depression, and falls . Referrals and appointments  In addition, I have reviewed and discussed with patient certain preventive protocols, quality metrics, and best practice recommendations. A written personalized care plan for preventive services as well as general preventive health recommendations were  provided to patient.  Mady Haagensen [place], [place]  11/29/2017

## 2017-11-28 ENCOUNTER — Other Ambulatory Visit: Payer: Self-pay | Admitting: Family

## 2017-11-28 MED FILL — METOPROLOL SUCCINATE ER 25: 25 | 90 days supply | Qty: 90 | Fill #0

## 2017-11-28 MED FILL — ATORVASTATIN 40 MG TABLET: 40 | 90 days supply | Qty: 90 | Fill #0

## 2017-11-29 ENCOUNTER — Encounter: Payer: Self-pay | Admitting: *Deleted

## 2017-11-29 ENCOUNTER — Ambulatory Visit (INDEPENDENT_AMBULATORY_CARE_PROVIDER_SITE_OTHER): Payer: PPO | Admitting: *Deleted

## 2017-11-29 VITALS — BP 136/64 | HR 71 | Ht 69.0 in | Wt 179.6 lb

## 2017-11-29 DIAGNOSIS — Z0000 Encounter for general adult medical examination without abnormal findings: Secondary | ICD-10-CM | POA: Diagnosis not present

## 2017-11-29 NOTE — Progress Notes (Signed)
RN note is reviewed and agree.  Sandford Craze NP

## 2017-11-29 NOTE — Patient Instructions (Signed)
Continue to eat heart healthy diet (full of fruits, vegetables, whole grains, lean protein, water--limit salt, fat, and sugar intake) and increase physical activity as tolerated.  Continue doing brain stimulating activities (puzzles, reading, adult coloring books, staying active) to keep memory sharp.    Ryan [name] , Thank you for taking time to come for your Medicare Wellness Visit. I appreciate your ongoing commitment to your health goals. Please review the following plan we discussed and let me know if I can assist you in the future.   These are the goals we discussed: Goals    . Weight (lb) < 175 lb (79.4 kg)       This is a list of the screening recommended for you and due dates:  Health Maintenance  Topic Date Due  . Eye exam for diabetics  05/12/2017  . Complete foot exam   11/22/2017  . Cologuard (Stool DNA test)  02/26/2018  . Flu Shot  03/09/2018  . Hemoglobin A1C  03/12/2018  . Tetanus Vaccine  08/24/2021  .  Hepatitis C: One time screening is recommended by Center for Disease Control  (CDC) for  adults born from [birth year] through 1965.   Completed  . Pneumonia vaccines  Completed     Health Maintenance, Male A healthy lifestyle and preventive care is important for your health and wellness. Ask your health care provider about what schedule of regular examinations is right for you. What should I know about weight and diet? Eat a Healthy Diet  Eat plenty of vegetables, fruits, whole grains, low-fat dairy products, and lean protein.  Do not eat a lot of foods high in solid fats, added sugars, or salt.  Maintain a Healthy Weight Regular exercise can help you achieve or maintain a healthy weight. You should:  Do at least 150 minutes of exercise each week. The exercise should increase your heart rate and make you sweat (moderate-intensity exercise).  Do strength-training exercises at least twice a week.  Watch Your Levels of Cholesterol and Blood Lipids  Have your blood  tested for lipids and cholesterol every 5 years starting at 72 years of age. If you are at high risk for heart disease, you should start having your blood tested when you are 72 years old. You may need to have your cholesterol levels checked more often if: ? Your lipid or cholesterol levels are high. ? You are older than 72 years of age. ? You are at high risk for heart disease.  What should I know about cancer screening? Many types of cancers can be detected early and may often be prevented. Lung Cancer  You should be screened every year for lung cancer if: ? You are a current smoker who has smoked for at least 30 years. ? You are a former smoker who has quit within the past 15 years.  Talk to your health care provider about your screening options, when you should start screening, and how often you should be screened.  Colorectal Cancer  Routine colorectal cancer screening usually begins at 72 years of age and should be repeated every 5-10 years until you are 72 years old. You may need to be screened more often if early forms of precancerous polyps or small growths are found. Your health care provider may recommend screening at an earlier age if you have risk factors for colon cancer.  Your health care provider may recommend using home test kits to check for hidden blood in the stool.  A small camera  at the end of a tube can be used to examine your colon (sigmoidoscopy or colonoscopy). This checks for the earliest forms of colorectal cancer.  Prostate and Testicular Cancer  Depending on your age and overall health, your health care provider may do certain tests to screen for prostate and testicular cancer.  Talk to your health care provider about any symptoms or concerns you have about testicular or prostate cancer.  Skin Cancer  Check your skin from head to toe regularly.  Tell your health care provider about any new moles or changes in moles, especially if: ? There is a change in  a mole's size, shape, or color. ? You have a mole that is larger than a pencil eraser.  Always use sunscreen. Apply sunscreen liberally and repeat throughout the day.  Protect yourself by wearing long sleeves, pants, a wide-brimmed hat, and sunglasses when outside.  What should I know about heart disease, diabetes, and high blood pressure?  If you are 12-66 years of age, have your blood pressure checked every 3-5 years. If you are 31 years of age or older, have your blood pressure checked every year. You should have your blood pressure measured twice--once when you are at a hospital or clinic, and once when you are not at a hospital or clinic. Record the average of the two measurements. To check your blood pressure when you are not at a hospital or clinic, you can use: ? An automated blood pressure machine at a pharmacy. ? A home blood pressure monitor.  Talk to your health care provider about your target blood pressure.  If you are between 23-32 years old, ask your health care provider if you should take aspirin to prevent heart disease.  Have regular diabetes screenings by checking your fasting blood sugar level. ? If you are at a normal weight and have a low risk for diabetes, have this test once every three years after the age of 75. ? If you are overweight and have a high risk for diabetes, consider being tested at a younger age or more often.  A one-time screening for abdominal aortic aneurysm (AAA) by ultrasound is recommended for men aged 30-75 years who are current or former smokers. What should I know about preventing infection? Hepatitis B If you have a higher risk for hepatitis B, you should be screened for this virus. Talk with your health care provider to find out if you are at risk for hepatitis B infection. Hepatitis C Blood testing is recommended for:  Everyone born from [birth year] through 1965.  Anyone with known risk factors for hepatitis C.  Sexually Transmitted Diseases  (STDs)  You should be screened each year for STDs including gonorrhea and chlamydia if: ? You are sexually active and are younger than 72 years of age. ? You are older than 72 years of age and your health care provider tells you that you are at risk for this type of infection. ? Your sexual activity has changed since you were last screened and you are at an increased risk for chlamydia or gonorrhea. Ask your health care provider if you are at risk.  Talk with your health care provider about whether you are at high risk of being infected with HIV. Your health care provider may recommend a prescription medicine to help prevent HIV infection.  What else can I do?  Schedule regular health, dental, and eye exams.  Stay current with your vaccines (immunizations).  Do not use any tobacco products,  such as cigarettes, chewing tobacco, and e-cigarettes. If you need help quitting, ask your health care provider.  Limit alcohol intake to no more than 2 drinks per day. One drink equals 12 ounces of beer, 5 ounces of wine, or 1 ounces of hard liquor.  Do not use street drugs.  Do not share needles.  Ask your health care provider for help if you need support or information about quitting drugs.  Tell your health care provider if you often feel depressed.  Tell your health care provider if you have ever been abused or do not feel safe at home. This information is not intended to replace advice given to you by your health care provider. Make sure you discuss any questions you have with your health care provider. Document Released: 01/22/2008 Document Revised: 03/24/2016 Document Reviewed: 04/29/2015 Elsevier Interactive Patient Education  [employer].

## 2017-12-12 ENCOUNTER — Other Ambulatory Visit: Payer: Self-pay | Admitting: Internal Medicine

## 2017-12-12 MED FILL — LISINOPRIL 5 MG TABLET: 5 | 30 days supply | Qty: 30 | Fill #3

## 2017-12-12 MED FILL — ELIQUIS 5 MG TABLET: 5 | 30 days supply | Qty: 60 | Fill #2

## 2017-12-13 MED FILL — ULTICARE SYR 1 ML 30GX5/16": 30G X 5/16" | 50 days supply | Qty: 200 | Fill #0

## 2017-12-13 MED FILL — ULTICARE SYR 1 ML 30GX5/16: 30G X 5/16" | 50 days supply | Qty: 200 | Fill #0

## 2017-12-21 MED FILL — NovoLOG 100 UNIT/ML SOLN: 100 | 38 days supply | Qty: 30 | Fill #2

## 2018-01-11 ENCOUNTER — Encounter: Payer: Self-pay | Admitting: Internal Medicine

## 2018-01-11 ENCOUNTER — Other Ambulatory Visit: Payer: Self-pay | Admitting: Family

## 2018-01-11 ENCOUNTER — Telehealth: Payer: Self-pay | Admitting: Internal Medicine

## 2018-01-11 ENCOUNTER — Ambulatory Visit: Payer: PPO | Admitting: Internal Medicine

## 2018-01-11 VITALS — BP 150/90 | HR 64 | Ht 69.0 in | Wt 182.2 lb

## 2018-01-11 DIAGNOSIS — E1165 Type 2 diabetes mellitus with hyperglycemia: Secondary | ICD-10-CM

## 2018-01-11 DIAGNOSIS — E1159 Type 2 diabetes mellitus with other circulatory complications: Secondary | ICD-10-CM | POA: Diagnosis not present

## 2018-01-11 DIAGNOSIS — E785 Hyperlipidemia, unspecified: Secondary | ICD-10-CM | POA: Diagnosis not present

## 2018-01-11 LAB — POCT GLYCOSYLATED HEMOGLOBIN (HGB A1C): Hemoglobin A1C: 6.8 % — AB (ref 4.0–5.6)

## 2018-01-11 LAB — MICROALBUMIN / CREATININE URINE RATIO
Creatinine,U: 68.8 mg/dL
Microalb Creat Ratio: 1 mg/g (ref 0.0–30.0)
Microalb, Ur: <0.7 mg/dL (ref 0.0–1.9)

## 2018-01-11 LAB — LIPID PANEL
Cholesterol: 133 mg/dL (ref 0–200)
HDL: 42.1 mg/dL (ref >39.00)
LDL Cholesterol: 79 mg/dL (ref 0–99)
NonHDL: 91.1
Total CHOL/HDL Ratio: 3
Triglycerides: 60 mg/dL (ref 0.0–149.0)
VLDL: 12 mg/dL (ref 0.0–40.0)

## 2018-01-11 MED ORDER — INSULIN ASPART 100 UNIT/ML ~~LOC~~ SOLN: SUBCUTANEOUS | 5 refills | Status: DC

## 2018-01-11 MED ORDER — "INSULIN SYRINGE-NEEDLE U-100 31G X 5/16"" 0.5 ML MISC": 3 refills | Status: DC

## 2018-01-11 MED ORDER — INSULIN DETEMIR 100 UNIT/ML ~~LOC~~ SOLN: SUBCUTANEOUS | 5 refills | Status: DC

## 2018-01-11 MED FILL — ELIQUIS 5 MG TABLET: 5 | 30 days supply | Qty: 60 | Fill #3

## 2018-01-11 MED FILL — LEVEMIR 100 UNITS/ML VIAL: 100 | 54 days supply | Qty: 20 | Fill #1

## 2018-01-11 NOTE — Telephone Encounter (Signed)
[facility] called re: RX for Novalog & levemer are not covered by insurance due to dosage change makes it look like a duplicate. Pharmacy feels that patient might get too confused by the new dosage. Can they leave the dosage the same as it was before today? Patient might require discussing how to adjust/decrease as needed. Pharmacy ph# [phone]

## 2018-01-11 NOTE — Patient Instructions (Addendum)
Please continue: - Levemir 30 units at bedtime - Novolog mealtime 7-9 units 3x a day before meals - Novolog sliding scale as follows:  - 150-175: + 1 unit  - 176-200: + 2 units  - 201-225: + 3 units  - 226-250: + 4 units  - >250: + 5 units If you exercise within 2h after a meal, reduce the insulin with that meal by 25-50%.  Please call and schedule an eye appt with Dr. Randon Goldsmith: [facility]:  Dr. Jeni Salles MD ?  Address: [address] [place], [place], [place] [address]  [phone]  Please return in 6 months with your sugar log.

## 2018-01-11 NOTE — Progress Notes (Signed)
Patient ID: Ryan [name], male   DOB: 07-14-1946, 72 y.o.   MRN: [id number]  HPI: Ryan [name] is a 72 y.o.-year-old male, returning for f/u for DM2 (?1), dx 1999, insulin-dependent since 2005, uncontrolled, with  long-term complications (CAD - s/p AMI 2017). Last visit 4 months ago.  Last hemoglobin A1c was: Lab Results  Component Value Date   HGBA1C 6.7 09/12/2017   HGBA1C 6.4 05/12/2017   HGBA1C 7.1 01/10/2017  06/10/2016: HbA1c calculated from the fructosamine is 6.45%, higher. 03/09/2016: HbA1c calculated from the fructosamine is great, at 5.9%! 01/27/2016: HbA1c calculated from fructosamine is 6.4%  He is now on: - Levemir 30 units at bedtime - Novolog mealtime 7-10 units 3x a day before meals - Novolog sliding scale as follows:  - 150-175: + 1 unit  - 176-200: + 2 units  - 201-225: + 3 units  - 226-250: + 4 units  - >250: + 5 units If you exercise within 2h after a meal, reduce the insulin with that meal by 25-50%. Was on Metformin 1000 mg with dinner (added 08/2015 >> now off) He was on an insulin pump after dx. He was taken off the pump ~2005 by the [place] as this was not covered anymore.   Pt checks his sugars 6-8 times a day per his exemplary log: - am: 71-146, 162 >> 80-140, 172 >> 80-162, 197 (insomnia) - 2h after b'fast  70-129, 154 >> 71-128, 131 >> 80-126 - before lunch: 70-133, 146 >> 70-142, 146, 190 >> 80-118, 120 - 2h after lunch:70-129 >> 75-150, 175 >> 79-122 - before dinner: 70-130 >> 75-137, 183 >> 80-134 - 2h after dinner:  71-138 >> 72-145, 180 >>80-150 - bedtime:   70-143, 150 >> 78-150 >> 82-160 - nighttime: 80-126, 139 >> 80-134, 155, 168 >> 132, 163 Lowest sugar was 43 on 04/04/2017 >> 70 >> 79. he has hypoglycemia awareness in the 60s. Highest sugar was  169 >> 197.  Pt's meals are: - Breakfast: egg beaters + toast - Lunch: cold cuts or salads - Dinner: meat + veggies - Snacks: fruit  He exercises daily: Strength exercises and walking on the  treadmill  -No history of CKD, but last BUN/creatinine higher: Lab Results  Component Value Date   BUN 26 (H) 08/10/2017   CREATININE 1.49 08/10/2017  On  lisinopril.. -+ HL; last set of lipids: Lab Results  Component Value Date   CHOL 127 01/11/2017   HDL 46.10 01/11/2017   LDLCALC 69 01/11/2017   TRIG 63.0 01/11/2017   CHOLHDL 3 01/11/2017  On pravastatin. - last eye exam was in 05/2016: No DR -No numbness and tingling in his feet.   In 2016, he had 3 deaths in the family within 4 months: His mother, his father, and his wife (ovarian cancer). He is taking care of his 20 year old grandson. His son and his daughter in law are both on disability for illness.   He has  A flutter  >> saw Dr. Tenny Craw >> stress test was normal .  He is on Eliquis.  ROS: Constitutional: no weight gain/no weight loss, no fatigue, no subjective hyperthermia, no subjective hypothermia Eyes: no blurry vision, no xerophthalmia ENT: no sore throat, no nodules palpated in throat, no dysphagia, no odynophagia, no hoarseness Cardiovascular: no CP/no SOB/no palpitations/no leg swelling Respiratory: no cough/no SOB/no wheezing Gastrointestinal: no N/no V/no D/no C/no acid reflux Musculoskeletal: no muscle aches/+ joint aches Skin: no rashes, no hair loss Neurological: no tremors/no numbness/no tingling/no dizziness  I reviewed pt's medications, allergies, PMH, social hx, family hx, and changes were documented in the history of present illness. Otherwise, unchanged from my initial visit note.  Past Medical History:  Diagnosis Date  . Allergy   . Diabetes mellitus   . GERD (gastroesophageal reflux disease)   . History of chicken pox   . Hyperlipidemia   . Hypertension   . Myocardial infarction (HCC) 2017  . Type 2 diabetes mellitus with hyperglycemia, with long-term current use of insulin (HCC) 09/08/2015   Past Surgical History:  Procedure Laterality Date  . EYE SURGERY  1998   bilateral  . SHOULDER  SURGERY  1990   right shoulder, torn rotator cuff.   Social History   Socioeconomic History  . Marital status: Married    Spouse name: Not on file  . Number of children: 1  . Years of education: Not on file  . Highest education level: Not on file  Occupational History  . Not on file  Social Needs  . Financial resource strain: Not on file  . Food insecurity:    Worry: Not on file    Inability: Not on file  . Transportation needs:    Medical: Not on file    Non-medical: Not on file  Tobacco Use  . Smoking status: Former Smoker    Types: Cigarettes    Last attempt to quit: 09/01/1965    Years since quitting: 52.3  . Smokeless tobacco: Never Used  Substance and Sexual Activity  . Alcohol use: No  . Drug use: No  . Sexual activity: Never  Lifestyle  . Physical activity:    Days per week: Not on file    Minutes per session: Not on file  . Stress: Not on file  Relationships  . Social connections:    Talks on phone: Not on file    Gets together: Not on file    Attends religious service: Not on file    Active member of club or organization: Not on file    Attends meetings of clubs or organizations: Not on file    Relationship status: Not on file  . Intimate partner violence:    Fear of current or ex partner: Not on file    Emotionally abused: Not on file    Physically abused: Not on file    Forced sexual activity: Not on file  Other Topics Concern  . Not on file  Social History Narrative   Regular exercise:  5 x weekly   Caffeine Use: 1 cups coffee daily.   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   Married- One son and grandson- both live with them   Wife has ovarian cancer            Current Outpatient Medications on File Prior to Visit  Medication Sig Dispense Refill  . ACCU-CHEK AVIVA PLUS test strip 1 each by Other route See admin instructions. Check blood sugars 6 times daily    . ACCU-CHEK SOFTCLIX LANCETS lancets 1 each by Other route See admin instructions. Check  blood sugar 6 times daily    . aspirin 81 MG tablet Take 81 mg by mouth daily.      Marland Kitchen atorvastatin (LIPITOR) 40 MG tablet TAKE 1 TABLET (40 MG TOTAL) BY MOUTH DAILY. 90 tablet 0  . Calcium Carbonate-Vitamin D (CALTRATE 600+D) 600-400 MG-UNIT per tablet Take 1 tablet by mouth 2 (two) times daily. Reported on 01/27/2016    . ELIQUIS 5 MG TABS tablet TAKE 1 TABLET (  5 MG TOTAL) BY MOUTH 2 TIMES DAILY. 60 tablet 5  . lisinopril (PRINIVIL,ZESTRIL) 5 MG tablet Take 1 tablet (5 mg total) by mouth daily. 30 tablet 3  . metoprolol succinate (TOPROL-XL) 25 MG 24 hr tablet TAKE 1 TABLET (25 MG TOTAL) BY MOUTH DAILY. 90 tablet 0   No current facility-administered medications on file prior to visit.    No Known Allergies Family History  Problem Relation Age of Onset  . Cancer Mother        cancer  . Heart disease Mother   . Hyperlipidemia Mother   . Hypertension Mother   . Hyperlipidemia Father   . Stroke Father   . Hypertension Father   . Diabetes Paternal Grandfather     PE: BP (!) 150/90   Pulse 64   Ht 5\' 9"  (1.753 m)   Wt 182 lb 3.2 oz (82.6 kg)   SpO2 97%   BMI 26.91 kg/m  Body mass index is 26.91 kg/m.    Wt Readings from Last 3 Encounters:  01/11/18 182 lb 3.2 oz (82.6 kg)  11/29/17 179 lb 9.6 oz (81.5 kg)  10/06/17 178 lb 12.8 oz (81.1 kg)   Constitutional: Normal weight, in NAD Eyes: PERRLA, EOMI, no exophthalmos ENT: moist mucous membranes, no thyromegaly, no cervical lymphadenopathy Cardiovascular: RRR, No MRG Respiratory: CTA B Gastrointestinal: abdomen soft, NT, ND, BS+ Musculoskeletal: no deformities, strength intact in all 4 Skin: moist, warm, no rashes Neurological: no tremor with outstretched hands, DTR normal in all 4   ASSESSMENT: 1. DM2 or 1, insulin-dependent, fairly well controlled, with long-term complications, but with occasional hypo and hyper-glycemia - He had to stop Metformin b/c GI sxs >> will keep off as sugars are at or close to goal  - CAD, s/p  AMI  2. HL  PLAN:  1. Patient with long-standing, uncontrolled, diabetes, on basal-bolus insulin regimen, with improved sugars at last visit, but lower postprandial CBGs.  We reduced his mealtime insulin doses at that time.  As he was not calculating his carbs or insulin doses, I gave him fixed doses.  He does a very good job checking sugars consistently during the day and always bringing a very good log.  We reviewed his log together again today. - At this visit, sugars are still well controlled, with the exception of more variable CBGs fasting, in a.m.  Usually, he records the higher CBGs after a poor night sleep.  He is not sleeping well ever since he returned back from service.  - No changes are noted at this time, but since sugars after a meal are similar to before the meal, I advised him to stick with the lower doses of NovoLog suggested, 7 units for a smaller meal and 9-10 units  only for larger meals - I suggested to:  Patient Instructions  Please continue: - Levemir 30 units at bedtime - Novolog mealtime 7-9 units 3x a day before meals - Novolog sliding scale as follows:  - 150-175: + 1 unit  - 176-200: + 2 units  - 201-225: + 3 units  - 226-250: + 4 units  - >250: + 5 units If you exercise within 2h after a meal, reduce the insulin with that meal by 25-50%.  Please call and schedule an eye appt with Dr. Randon Goldsmith: [facility]:  Dr. Jeni Salles MD ?  Address: [address] [place], [place], [place] [address]  [phone]  Please return in 6 months with your sugar log.   -  today, HbA1c is 6.8% (slightly higher) - continue checking sugars at different times of the day - check 3-4x a day, rotating checks - advised for yearly eye exams >> he is not UTD as he needs a doctor that takes his insurance.  Recommended Dr. Randon Goldsmith - Return to clinic in 6 mo with sugar log   2. HL - Reviewed latest lipid panel from 01/2017: All lipid  fractions were excellent -  Continues  pravastatin without side effects.  - We will recheck this today  Office Visit on 01/11/2018  Component Date Value Ref Range Status  . Cholesterol 01/11/2018 133  0 - 200 mg/dL Final   ATP III Classification       Desirable:  < 200 mg/dL               Borderline High:  200 - 239 mg/dL          High:  > = 244 mg/dL  . Triglycerides 01/11/2018 60.0  0.0 - 149.0 mg/dL Final   Normal:  <010 mg/dLBorderline High:  150 - 199 mg/dL  . HDL 01/11/2018 42.10  >39.00 mg/dL Final  . VLDL 27/25/3664 12.0  0.0 - 40.0 mg/dL Final  . LDL Cholesterol 01/11/2018 79  0 - 99 mg/dL Final  . Total CHOL/HDL Ratio 01/11/2018 3   Final                  Men          Women1/2 Average Risk     3.4          3.3Average Risk          5.0          4.42X Average Risk          9.6          7.13X Average Risk          15.0          11.0                      . NonHDL 01/11/2018 91.10   Final   NOTE:  Non-HDL goal should be 30 mg/dL higher than patient's LDL goal (i.e. LDL goal of < 70 mg/dL, would have non-HDL goal of < 100 mg/dL)  . Microalb, Ur 01/11/2018 <0.7  0.0 - 1.9 mg/dL Final  . Creatinine,U 40/34/7425 68.8  mg/dL Final  . Microalb Creat Ratio 01/11/2018 1.0  0.0 - 30.0 mg/g Final  . Hemoglobin A1C 01/11/2018 6.8* 4.0 - 5.6 % Final   Excellent labs!  Carlus Pavlov, MD PhD [facility] Endocrinology

## 2018-01-11 NOTE — Telephone Encounter (Signed)
Went over the new order for the pt and discussed his max dosage. They stated they would send through insurance with the max dose for the pt.

## 2018-01-12 MED FILL — LISINOPRIL 5 MG TABLET: 5 | 30 days supply | Qty: 30 | Fill #0

## 2018-01-25 MED FILL — NovoLOG 100 UNIT/ML SOLN: 100 | 67 days supply | Qty: 20 | Fill #0

## 2018-02-13 MED FILL — ELIQUIS 5 MG TABLET: 5 | 30 days supply | Qty: 60 | Fill #4

## 2018-02-13 MED FILL — LISINOPRIL 5 MG TABLET: 5 | 30 days supply | Qty: 30 | Fill #1

## 2018-02-27 ENCOUNTER — Other Ambulatory Visit: Payer: Self-pay | Admitting: Family

## 2018-02-27 MED FILL — METOPROLOL SUCCINATE ER 25: 25 | 90 days supply | Qty: 90 | Fill #0

## 2018-02-27 MED FILL — ATORVASTATIN 40 MG TABLET: 40 | 90 days supply | Qty: 90 | Fill #0

## 2018-02-27 NOTE — Telephone Encounter (Signed)
Called pt and scheduled a follow up for 03/08/18

## 2018-02-27 NOTE — Telephone Encounter (Signed)
Atorvastatin and Metoprolol refills sent to pharmacy. Pt due for follow up with [name]. Please call pt to schedule appt. Thanks!

## 2018-03-08 ENCOUNTER — Encounter: Payer: Self-pay | Admitting: Family

## 2018-03-08 ENCOUNTER — Ambulatory Visit (INDEPENDENT_AMBULATORY_CARE_PROVIDER_SITE_OTHER): Payer: PPO | Admitting: Family

## 2018-03-08 VITALS — BP 138/88 | HR 65 | Temp 98.0°F | Resp 16 | Ht 69.0 in | Wt 172.2 lb

## 2018-03-08 DIAGNOSIS — M545 Low back pain: Secondary | ICD-10-CM | POA: Diagnosis not present

## 2018-03-08 DIAGNOSIS — E785 Hyperlipidemia, unspecified: Secondary | ICD-10-CM | POA: Diagnosis not present

## 2018-03-08 DIAGNOSIS — Z794 Long term (current) use of insulin: Secondary | ICD-10-CM

## 2018-03-08 DIAGNOSIS — I10 Essential (primary) hypertension: Secondary | ICD-10-CM | POA: Diagnosis not present

## 2018-03-08 DIAGNOSIS — E118 Type 2 diabetes mellitus with unspecified complications: Secondary | ICD-10-CM | POA: Diagnosis not present

## 2018-03-08 LAB — BASIC METABOLIC PANEL
BUN: 15 mg/dL (ref 6–23)
CO2: 27 mEq/L (ref 19–32)
Calcium: 9.6 mg/dL (ref 8.4–10.5)
Chloride: 102 mEq/L (ref 96–112)
Creatinine, Ser: 0.95 mg/dL (ref 0.40–1.50)
GFR: 82.87 mL/min (ref >60.00)
Glucose, Bld: 170 mg/dL — ABNORMAL HIGH (ref 70–99)
Potassium: 4.4 mEq/L (ref 3.5–5.1)
Sodium: 135 mEq/L (ref 135–145)

## 2018-03-08 NOTE — Progress Notes (Signed)
Subjective:    Patient ID: Ryan [name], male    DOB: [birth year], 72 y.o.   MRN: [id number]  HPI   Ryan [name] is a 72 yr old male who presents today for follow up.   HTN- reports BP 126/71 at home this AM.  Ryan [name] is having some back pain today.Ryan [name] is maintained on lisinopril and metoprolol.   BP Readings from Last 3 Encounters:  03/08/18 (!) 144/81  01/11/18 (!) 150/90  11/29/17 136/64   DM2- Ryan [name] continues to follow with endocrinology. Reports that his sugars have been stable.   Lab Results  Component Value Date   HGBA1C 6.8 (A) 01/11/2018   HGBA1C 6.7 09/12/2017   HGBA1C 6.4 05/12/2017   Lab Results  Component Value Date   MICROALBUR <0.7 01/11/2018   LDLCALC 79 01/11/2018   CREATININE 1.49 08/10/2017   Hyperlipidemia- maintained on lipitor. Maintained on lipitor '40mg'$ .  Lab Results  Component Value Date   CHOL 133 01/11/2018   HDL 42.10 01/11/2018   LDLCALC 79 01/11/2018   TRIG 60.0 01/11/2018   CHOLHDL 3 01/11/2018   Back pain- Notes back pain is located in the left lower back and can radiate around to the right upper leg.  Has been present x 3 weeks.     Review of Systems See HPI     Past Medical History:  Diagnosis Date  . Allergy   . Diabetes mellitus   . GERD (gastroesophageal reflux disease)   . History of chicken pox   . Hyperlipidemia   . Hypertension   . Myocardial infarction ([facility]) 2017  . Type 2 diabetes mellitus with hyperglycemia, with long-term current use of insulin ([facility]) 09/08/2015     Social History   Socioeconomic History  . Marital status: Married    Spouse name: Not on file  . Number of children: 1  . Years of education: Not on file  . Highest education level: Not on file  Occupational History  . Not on file  Social Needs  . Financial resource strain: Not on file  . Food insecurity:    Worry: Not on file    Inability: Not on file  . Transportation needs:    Medical: Not on file    Non-medical: Not on file  Tobacco Use  .  Smoking status: Former Smoker    Types: Cigarettes    Last attempt to quit: 09/01/1965    Years since quitting: 52.5  . Smokeless tobacco: Never Used  Substance and Sexual Activity  . Alcohol use: No  . Drug use: No  . Sexual activity: Never  Lifestyle  . Physical activity:    Days per week: Not on file    Minutes per session: Not on file  . Stress: Not on file  Relationships  . Social connections:    Talks on phone: Not on file    Gets together: Not on file    Attends religious service: Not on file    Active member of club or organization: Not on file    Attends meetings of clubs or organizations: Not on file    Relationship status: Not on file  . Intimate partner violence:    Fear of current or ex partner: Not on file    Emotionally abused: Not on file    Physically abused: Not on file    Forced sexual activity: Not on file  Other Topics Concern  . Not on file  Social History Narrative   Regular exercise:  5 x weekly   Caffeine Use: 1 cups coffee daily.   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   Married- One son and grandson- both live with them   Wife has ovarian cancer             Past Surgical History:  Procedure Laterality Date  . EYE SURGERY  1998   bilateral  . SHOULDER SURGERY  1990   right shoulder, torn rotator cuff.    Family History  Problem Relation Age of Onset  . Cancer Mother        cancer  . Heart disease Mother   . Hyperlipidemia Mother   . Hypertension Mother   . Hyperlipidemia Father   . Stroke Father   . Hypertension Father   . Diabetes Paternal Grandfather     No Known Allergies  Current Outpatient Medications on File Prior to Visit  Medication Sig Dispense Refill  . ACCU-CHEK AVIVA PLUS test strip 1 each by Other route See admin instructions. Check blood sugars 6 times daily    . ACCU-CHEK SOFTCLIX LANCETS lancets 1 each by Other route See admin instructions. Check blood sugar 6 times daily    . aspirin 81 MG tablet Take 81 mg by  mouth daily.      Marland Kitchen atorvastatin (LIPITOR) 40 MG tablet TAKE 1 TABLET (40 MG TOTAL) BY MOUTH DAILY. 90 tablet 0  . Calcium Carbonate-Vitamin D (CALTRATE 600+D) 600-400 MG-UNIT per tablet Take 1 tablet by mouth 2 (two) times daily. Reported on 01/27/2016    . ELIQUIS 5 MG TABS tablet TAKE 1 TABLET (5 MG TOTAL) BY MOUTH 2 TIMES DAILY. 60 tablet 5  . insulin aspart (NOVOLOG) 100 UNIT/ML injection INJECT 7-10 UNITS INTO THE SKIN 3 TIMES DAILY WITH MEALS. 30 mL 5  . insulin detemir (LEVEMIR) 100 UNIT/ML injection INJECT 0.30 ML (30 UNITS) INTO THE SKIN AT BEDTIME 30 mL 5  . Insulin Syringe-Needle U-100 (BD INSULIN SYRINGE ULTRAFINE) 31G X 5/16" 0.5 ML MISC Use 4x a day 300 each 3  . lisinopril (PRINIVIL,ZESTRIL) 5 MG tablet TAKE 1 TABLET (5 MG TOTAL) BY MOUTH DAILY. 30 tablet 3  . metoprolol succinate (TOPROL-XL) 25 MG 24 hr tablet TAKE 1 TABLET (25 MG TOTAL) BY MOUTH DAILY. 90 tablet 0   No current facility-administered medications on file prior to visit.     BP (!) 144/81 (BP Location: Left Arm, Patient Position: Sitting, Cuff Size: Small)   Pulse 65   Temp 98 F (36.7 C) (Oral)   Resp 16   Ht '5\' 9"'$  (1.753 m)   Wt 172 lb 3.2 oz (78.1 kg)   SpO2 93%   BMI 25.43 kg/m    Objective:   Physical Exam  Constitutional: Ryan [name] is oriented to person, place, and time. Ryan [name] appears well-developed and well-nourished. No distress.  HENT:  Head: Normocephalic and atraumatic.  Cardiovascular: Normal rate and regular rhythm.  No murmur heard. Pulmonary/Chest: Effort normal and breath sounds normal. No respiratory distress. Ryan [name] has no wheezes. Ryan [name] has no rales.  Musculoskeletal: Ryan [name] exhibits no edema.       Thoracic back: Ryan [name] exhibits no tenderness.       Lumbar back: Ryan [name] exhibits no tenderness.  + tenderness to palpation of left lower back  Neurological: Ryan [name] is alert and oriented to person, place, and time.  Skin: Skin is warm and dry.  Psychiatric: Ryan [name] has a normal mood and affect. His behavior is normal.  Thought content normal.  Assessment & Plan:  DM2- stable, management per endo.  HTN- BP acceptable for his age.  Continue current meds.   Obtain follow up bmet.   Low back pain-  Advised pt as follows:   Add tylenol '1000mg'$  twice daily for the next few weeks for your back pain.  You can also use salon pas patches for your back pain. Let me know if your back pain worsens or if not improved in 2-3 weeks.  Will try to avoid nsaids due to renal insufficiency and steroids due to DM.   Hyperlipidemia- tolerating statin. LDL at goal.

## 2018-03-08 NOTE — Patient Instructions (Signed)
Please complete lab work prior to leaving. Continue current meds. Add tylenol 1000mg  twice daily for the next few weeks for your back pain.  You can also use salon pas patches for your back pain. Let me know if your back pain worsens or if not improved in 2-3 weeks.

## 2018-03-09 ENCOUNTER — Other Ambulatory Visit: Payer: Self-pay

## 2018-03-09 ENCOUNTER — Telehealth: Payer: Self-pay | Admitting: Internal Medicine

## 2018-03-09 MED ORDER — INSULIN ASPART 100 UNIT/ML ~~LOC~~ SOLN: SUBCUTANEOUS | 5 refills | Status: DC

## 2018-03-09 NOTE — Telephone Encounter (Signed)
Pt has been using the sliding scale and mainly using between 10-12 units. Resent his prescription with the dosage and pt will pick it up since his old prescription he has been running out.

## 2018-03-09 NOTE — Telephone Encounter (Signed)
Patient would like a call back to discuss his insulin  Please advise

## 2018-03-10 MED FILL — LEVEMIR 100 UNITS/ML VIAL: 100 | 67 days supply | Qty: 20 | Fill #0

## 2018-03-13 MED FILL — LISINOPRIL 5 MG TABLET: 5 | 30 days supply | Qty: 30 | Fill #2

## 2018-03-13 MED FILL — ELIQUIS 5 MG TABLET: 5 | 30 days supply | Qty: 60 | Fill #5

## 2018-03-23 MED FILL — NovoLOG 100 UNIT/ML SOLN: 100 | 84 days supply | Qty: 30 | Fill #0

## 2018-04-13 ENCOUNTER — Other Ambulatory Visit: Payer: Self-pay | Admitting: Internal Medicine

## 2018-04-13 MED FILL — ELIQUIS 5 MG TABLET: 5 | 30 days supply | Qty: 60 | Fill #0

## 2018-04-13 MED FILL — LISINOPRIL 5 MG TABLET: 5 | 30 days supply | Qty: 30 | Fill #3

## 2018-04-13 NOTE — Telephone Encounter (Signed)
Eliquis 5mg  refill request received; pt is 72 yrs old, Wt-78.1kg, Crea-0.95 on 03/08/18, last seen by Dr. Tenny Craw on 10/06/17; will send in refill to requested pharmacy.

## 2018-05-15 ENCOUNTER — Other Ambulatory Visit: Payer: Self-pay | Admitting: Family

## 2018-05-15 DIAGNOSIS — I10 Essential (primary) hypertension: Secondary | ICD-10-CM

## 2018-05-15 MED FILL — TRUEPLUS SYR 0.5ML 31GX5/16: 31G X 5/16" | 75 days supply | Qty: 300 | Fill #0

## 2018-05-15 MED FILL — ELIQUIS 5 MG TABLET: 5 | 30 days supply | Qty: 60 | Fill #1

## 2018-05-17 MED FILL — LISINOPRIL 5 MG TABLET: 5 | 30 days supply | Qty: 30 | Fill #0

## 2018-05-18 MED FILL — LEVEMIR 100 UNITS/ML VIAL: 100 | 67 days supply | Qty: 20 | Fill #1

## 2018-05-26 ENCOUNTER — Ambulatory Visit (INDEPENDENT_AMBULATORY_CARE_PROVIDER_SITE_OTHER): Payer: PPO

## 2018-05-26 DIAGNOSIS — Z23 Encounter for immunization: Secondary | ICD-10-CM | POA: Diagnosis not present

## 2018-05-26 NOTE — Progress Notes (Signed)
Patient in today to have his Flu vaccine  He tolerated the injection well I n his right arm

## 2018-05-27 IMAGING — DX DG CHEST 2V
2 series · 2 of 2 positions shown · non-contrast
Comparison: PA and lateral chest 05/07/2013 and 07/13/2011.

CLINICAL DATA: Tachycardia and irregular heart rate today.

EXAM:
CHEST  2 VIEW

[chest pa]
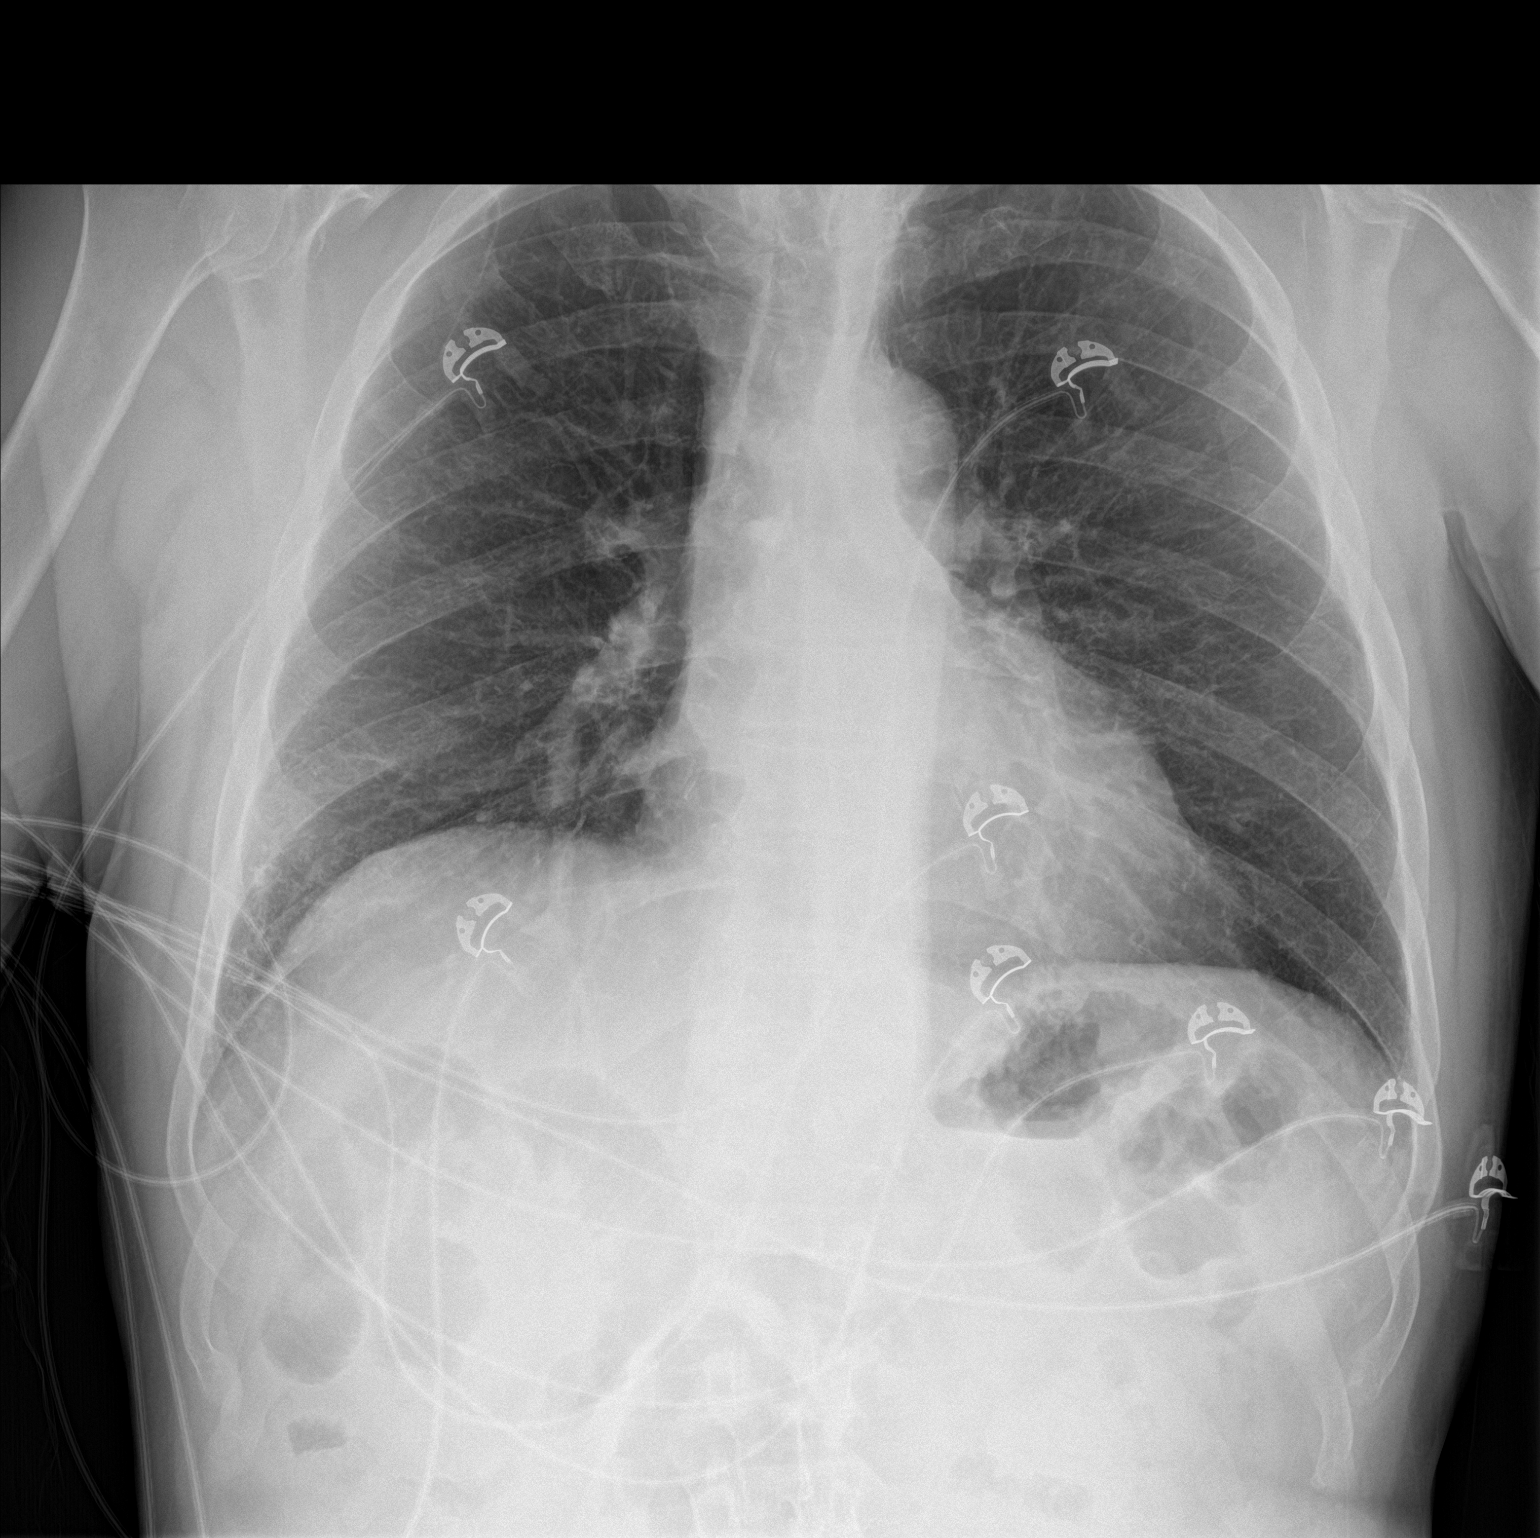

[chest lat]
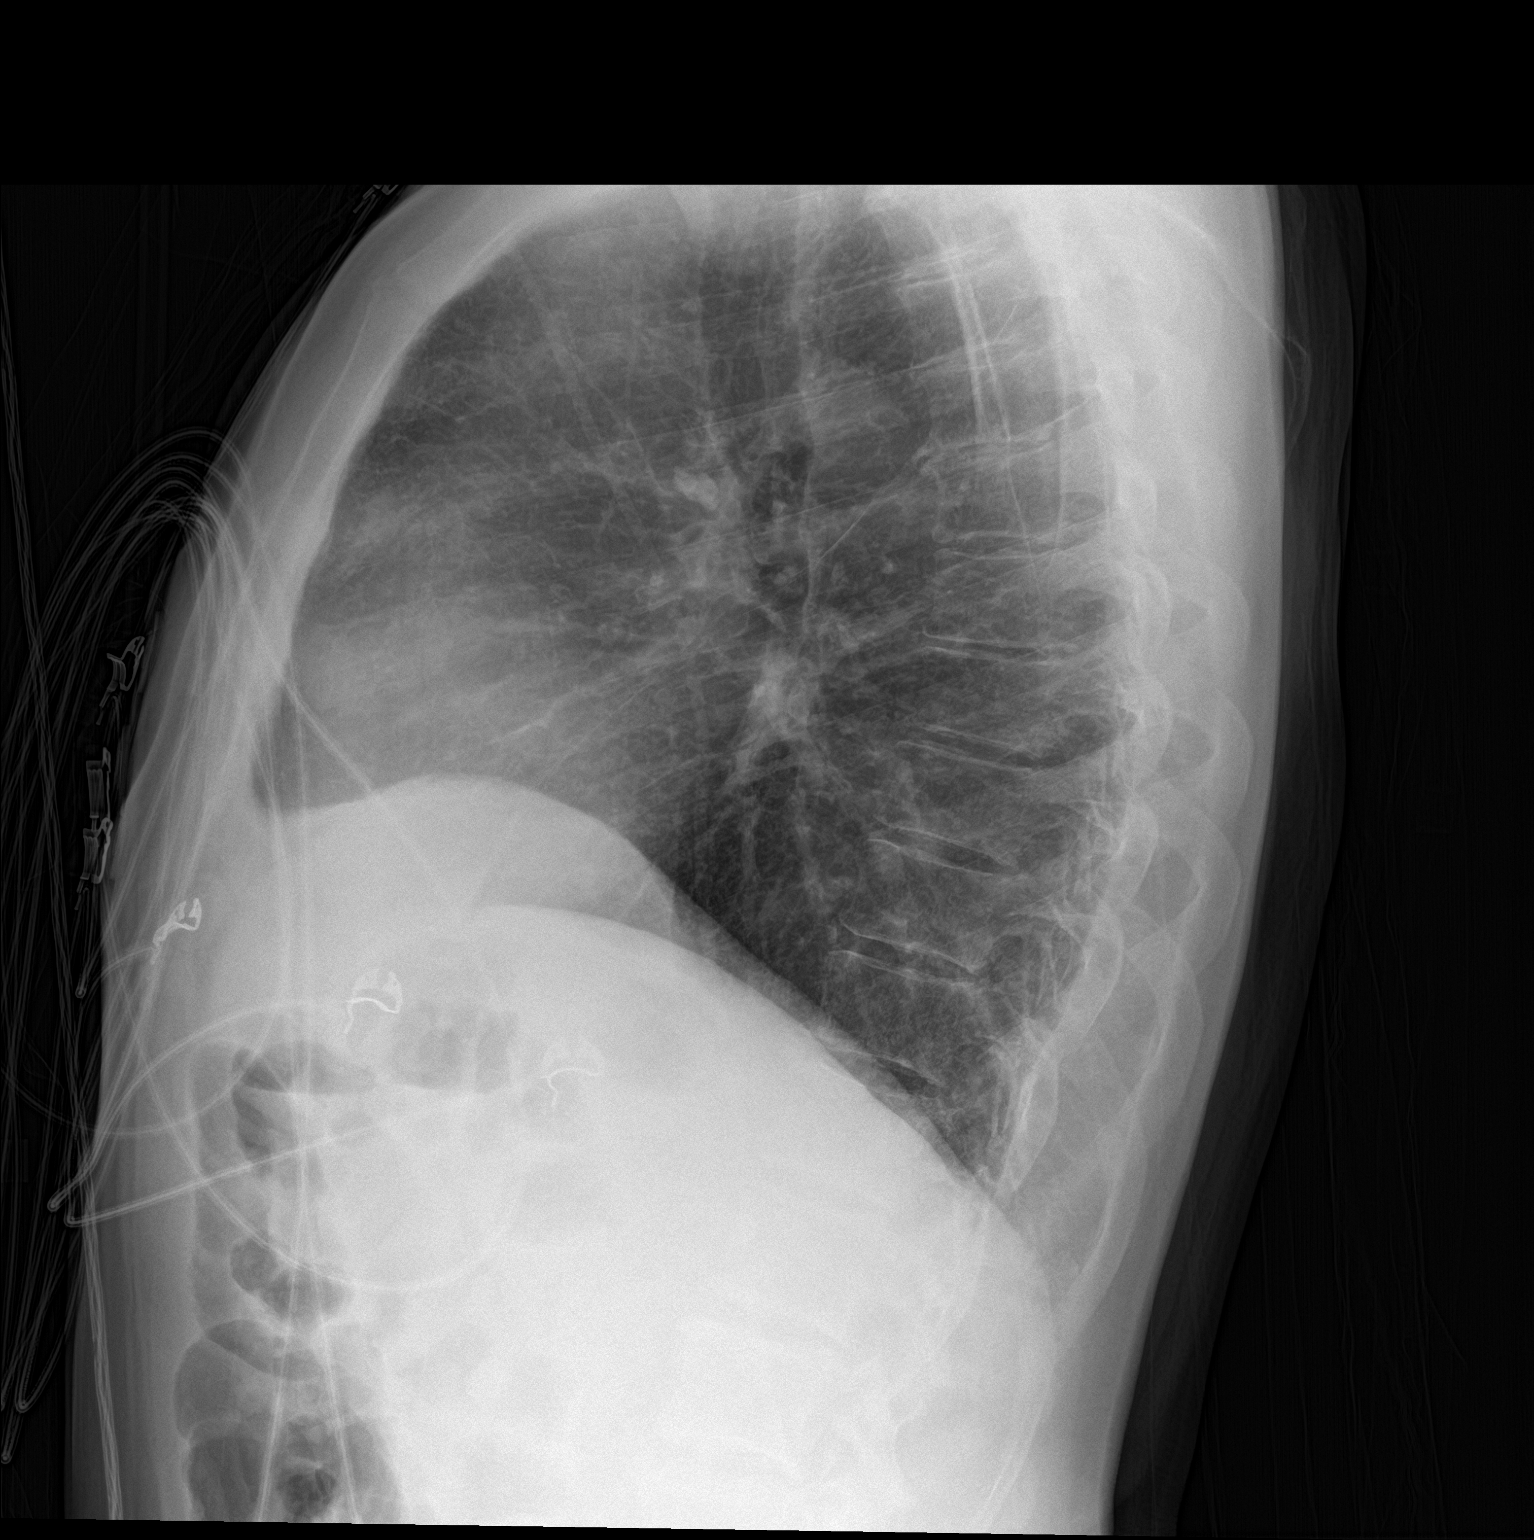

[2 of 2 positions shown; findings below may reference images not displayed]

FINDINGS: The lungs are clear. Heart size is normal. There is no pneumothorax
or pleural effusion. No focal bony abnormality.
IMPRESSION: No acute disease.

## 2018-05-29 ENCOUNTER — Other Ambulatory Visit: Payer: Self-pay | Admitting: Family

## 2018-05-29 MED FILL — METOPROLOL SUCCINATE ER 25: 25 | 90 days supply | Qty: 90 | Fill #0

## 2018-05-29 MED FILL — ATORVASTATIN 40 MG TABLET: 40 | 90 days supply | Qty: 90 | Fill #0

## 2018-06-05 MED FILL — NovoLOG 100 UNIT/ML SOLN: 100 | 67 days supply | Qty: 20 | Fill #1

## 2018-06-14 MED FILL — LISINOPRIL 5 MG TABLET: 5 | 30 days supply | Qty: 30 | Fill #1

## 2018-06-14 MED FILL — ELIQUIS 5 MG TABLET: 5 | 30 days supply | Qty: 60 | Fill #2

## 2018-07-13 ENCOUNTER — Encounter: Payer: Self-pay | Admitting: Internal Medicine

## 2018-07-13 ENCOUNTER — Ambulatory Visit (INDEPENDENT_AMBULATORY_CARE_PROVIDER_SITE_OTHER): Payer: PPO | Admitting: Internal Medicine

## 2018-07-13 VITALS — BP 146/88 | HR 67 | Ht 69.0 in | Wt 173.0 lb

## 2018-07-13 DIAGNOSIS — E1159 Type 2 diabetes mellitus with other circulatory complications: Secondary | ICD-10-CM | POA: Diagnosis not present

## 2018-07-13 DIAGNOSIS — E785 Hyperlipidemia, unspecified: Secondary | ICD-10-CM | POA: Diagnosis not present

## 2018-07-13 DIAGNOSIS — E1165 Type 2 diabetes mellitus with hyperglycemia: Secondary | ICD-10-CM | POA: Diagnosis not present

## 2018-07-13 LAB — POCT GLYCOSYLATED HEMOGLOBIN (HGB A1C): Hemoglobin A1C: 6.2 % — AB (ref 4.0–5.6)

## 2018-07-13 MED FILL — ELIQUIS 5 MG TABLET: 5 | 30 days supply | Qty: 60 | Fill #3

## 2018-07-13 MED FILL — LISINOPRIL 5 MG TABLET: 5 | 30 days supply | Qty: 30 | Fill #2

## 2018-07-13 NOTE — Patient Instructions (Signed)
Please continue: - Levemir 30 units at bedtime - Novolog mealtime 7-9 units 3x a day before meals - Novolog sliding scale as follows:  - 150-175: + 1 unit  - 176-200: + 2 units  - 201-225: + 3 units  - 226-250: + 4 units  - >250: + 5 units If you exercise within 2h after a meal, reduce the insulin with that meal by 25-50%.

## 2018-07-13 NOTE — Progress Notes (Signed)
Patient ID: Ryan [name], male   DOB: 1946/02/06, 72 y.o.   MRN: [id number]  HPI: Ryan [name] is a 72 y.o.-year-old male, returning for f/u for DM2 (?1), dx 1999, insulin-dependent since 2005, uncontrolled, with  long-term complications (CAD - s/p AMI 2017). Last visit 6 months ago.  He changed his diet since last visit >> sugars improved.  Last hemoglobin A1c was: Lab Results  Component Value Date   HGBA1C 6.8 (A) 01/11/2018   HGBA1C 6.7 09/12/2017   HGBA1C 6.4 05/12/2017  06/10/2016: HbA1c calculated from the fructosamine is 6.45%, higher. 03/09/2016: HbA1c calculated from the fructosamine is great, at 5.9%! 01/27/2016: HbA1c calculated from fructosamine is 6.4%  He is now on: - Levemir 30 units at bedtime - Novolog mealtime 7-9 units 3x a day before meals - Novolog sliding scale as follows:  - 150-175: + 1 unit  - 176-200: + 2 units  - 201-225: + 3 units  - 226-250: + 4 units  - >250: + 5 units If you exercise within 2h after a meal, reduce the insulin with that meal by 25-50%. Was on Metformin 1000 mg with dinner (added 08/2015 >> now off) He was on an insulin pump after dx. He was taken off the pump ~2005 by the [place] as this was not covered anymore.   Pt checks his sugars 6-8 times a day per his exemplary log: - am:  80-140, 172 >> 80-162, 197 (insomnia) >> 80-168 - 2h after b'fast 71-128, 131 >> 80-126 >> 82-130 - before lunch: 70-142, 146, 190 >> 80-118, 120 >> 82-114, 147 - 2h after lunch: 75-150, 175 >> 79-122 >> 80-139 - before dinner: 75-137, 183 >> 80-134 >> 80-112, 140 - 2h after dinner: 72-145, 180 >>80-150 >> 80-123 - bedtime: 78-150 >> 82-160 >> 80-150 - nighttime: 80-134, 155, 168 >> 132, 163 >> 80-150 Lowest sugar was 43 on 04/04/2017 >> 70 >> 79 >> 60. he has hypoglycemia awareness in the 60s. Highest sugar was  169 >> 197 >> 200 (sick)  Pt's meals are: - Breakfast: egg beaters + toast - Lunch: cold cuts or salads - Dinner: meat + veggies - Snacks:  fruit  He exercises daily: Strength exercises and walking on the treadmill.  -No CKD: Lab Results  Component Value Date   BUN 15 03/08/2018   CREATININE 0.95 03/08/2018  On  Lisinopril. -+ HL; last set of lipids: Lab Results  Component Value Date   CHOL 133 01/11/2018   HDL 42.10 01/11/2018   LDLCALC 79 01/11/2018   TRIG 60.0 01/11/2018   CHOLHDL 3 01/11/2018  On Pravastatin. - last eye exam was in 05/2016: No DR - no numbness and tingling in his feet.   In 2016, he had 3 deaths in the family within 4 months: His mother, his father, and his wife (ovarian cancer). He is taking care of his 7 year old grandson. His son and his daughter in law are both on disability for illness.   He has  A flutter  >> saw Dr. Tenny Craw >> stress test was normal .  He is on Eliquis.  ROS: Constitutional: no weight gain/weight loss, no fatigue, no subjective hyperthermia, no subjective hypothermia Eyes: no blurry vision, no xerophthalmia ENT: no sore throat, no nodules palpated in neck, no dysphagia, no odynophagia, no hoarseness Cardiovascular: no CP/no SOB/no palpitations/no leg swelling Respiratory: no cough/no SOB/no wheezing Gastrointestinal: no N/no V/no D/no C/no acid reflux Musculoskeletal: no muscle aches/no joint aches Skin: no rashes, no hair loss Neurological:  no tremors/no numbness/no tingling/no dizziness  I reviewed pt's medications, allergies, PMH, social hx, family hx, and changes were documented in the history of present illness. Otherwise, unchanged from my initial visit note.  Past Medical History:  Diagnosis Date  . Allergy   . Diabetes mellitus   . GERD (gastroesophageal reflux disease)   . History of chicken pox   . Hyperlipidemia   . Hypertension   . Myocardial infarction (HCC) 2017  . Type 2 diabetes mellitus with hyperglycemia, with long-term current use of insulin (HCC) 09/08/2015   Past Surgical History:  Procedure Laterality Date  . EYE SURGERY  1998   bilateral   . SHOULDER SURGERY  1990   right shoulder, torn rotator cuff.   Social History   Socioeconomic History  . Marital status: Married    Spouse name: Not on file  . Number of children: 1  . Years of education: Not on file  . Highest education level: Not on file  Occupational History  . Not on file  Social Needs  . Financial resource strain: Not on file  . Food insecurity:    Worry: Not on file    Inability: Not on file  . Transportation needs:    Medical: Not on file    Non-medical: Not on file  Tobacco Use  . Smoking status: Former Smoker    Types: Cigarettes    Last attempt to quit: 09/01/1965    Years since quitting: 52.8  . Smokeless tobacco: Never Used  Substance and Sexual Activity  . Alcohol use: No  . Drug use: No  . Sexual activity: Never  Lifestyle  . Physical activity:    Days per week: Not on file    Minutes per session: Not on file  . Stress: Not on file  Relationships  . Social connections:    Talks on phone: Not on file    Gets together: Not on file    Attends religious service: Not on file    Active member of club or organization: Not on file    Attends meetings of clubs or organizations: Not on file    Relationship status: Not on file  . Intimate partner violence:    Fear of current or ex partner: Not on file    Emotionally abused: Not on file    Physically abused: Not on file    Forced sexual activity: Not on file  Other Topics Concern  . Not on file  Social History Narrative   Regular exercise:  5 x weekly   Caffeine Use: 1 cups coffee daily.   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   Married- One son and grandson- both live with them   Wife has ovarian cancer            Current Outpatient Medications on File Prior to Visit  Medication Sig Dispense Refill  . ACCU-CHEK AVIVA PLUS test strip 1 each by Other route See admin instructions. Check blood sugars 6 times daily    . ACCU-CHEK SOFTCLIX LANCETS lancets 1 each by Other route See admin  instructions. Check blood sugar 6 times daily    . aspirin 81 MG tablet Take 81 mg by mouth daily.      Marland Kitchen atorvastatin (LIPITOR) 40 MG tablet TAKE 1 TABLET (40 MG TOTAL) BY MOUTH DAILY. 90 tablet 0  . Calcium Carbonate-Vitamin D (CALTRATE 600+D) 600-400 MG-UNIT per tablet Take 1 tablet by mouth 2 (two) times daily. Reported on 01/27/2016    . ELIQUIS 5  MG TABS tablet TAKE 1 TABLET (5 MG TOTAL) BY MOUTH 2 TIMES DAILY. 60 tablet 5  . insulin aspart (NOVOLOG) 100 UNIT/ML injection INJECT 10-12 UNITS INTO THE SKIN 3 TIMES DAILY WITH MEALS. UNLESS SUGAR BELOW 150 40 mL 5  . insulin detemir (LEVEMIR) 100 UNIT/ML injection INJECT 0.30 ML (30 UNITS) INTO THE SKIN AT BEDTIME 30 mL 5  . Insulin Syringe-Needle U-100 (BD INSULIN SYRINGE ULTRAFINE) 31G X 5/16" 0.5 ML MISC Use 4x a day 300 each 3  . lisinopril (PRINIVIL,ZESTRIL) 5 MG tablet TAKE 1 TABLET (5 MG TOTAL) BY MOUTH DAILY. 30 tablet 3  . metoprolol succinate (TOPROL-XL) 25 MG 24 hr tablet TAKE 1 TABLET (25 MG TOTAL) BY MOUTH DAILY. 90 tablet 0   No current facility-administered medications on file prior to visit.    No Known Allergies Family History  Problem Relation Age of Onset  . Cancer Mother        cancer  . Heart disease Mother   . Hyperlipidemia Mother   . Hypertension Mother   . Hyperlipidemia Father   . Stroke Father   . Hypertension Father   . Diabetes Paternal Grandfather     PE: BP (!) 146/88   Pulse 67   Ht 5\' 9"  (1.753 m) Comment: measured  Wt 173 lb (78.5 kg)   SpO2 97%   BMI 25.55 kg/m  Body mass index is 25.55 kg/m.    Wt Readings from Last 3 Encounters:  07/13/18 173 lb (78.5 kg)  03/08/18 172 lb 3.2 oz (78.1 kg)  01/11/18 182 lb 3.2 oz (82.6 kg)   Constitutional: Slightly overweight, in NAD Eyes: PERRLA, EOMI, no exophthalmos ENT: moist mucous membranes, no thyromegaly, no cervical lymphadenopathy Cardiovascular: RRR, No MRG Respiratory: CTA B Gastrointestinal: abdomen soft, NT, ND, BS+ Musculoskeletal:  no deformities, strength intact in all 4 Skin: moist, warm, no rashes Neurological: no tremor with outstretched hands, DTR normal in all 4   ASSESSMENT: 1. DM2 or 1, insulin-dependent, fairly well controlled, with long-term complications, but with occasional hypo and hyper-glycemia - He had to stop Metformin b/c GI sxs >> will keep off as sugars are at or close to goal  - CAD, s/p AMI  2. HL  3.  Overweight  PLAN:  1. Patient with longstanding, uncontrolled diabetes, with better control more recently.  He is on a basal-bolus insulin regimen, with lower postprandial CBGs at the previous visits, after which we reduce his NovoLog with meals.  At last visit, he was having more variable fasting CBGs.  He was telling me that he was not sleeping well ever since he returned back from service.  We did not change the regimen at last visit but I advised him to stick with the lowest NovoLog doses with meals. -At this visit, reviewing his great sugar log, his sugars are almost all of them at goal.  He has occasional spikes in the morning, in the 150s and even 170s, usually after a night in which she did not sleep well.  He has PTSD from the time spent in the [employer].  He had one instance when his sugars decreased to 60 during the night.  At that time, he was having an episode of confusion but he believes that his confusion was related to having a very vivid dream about the war.  No other episodes of low blood sugars. -We will continue the current regimen for now. - I suggested to:  Patient Instructions  Please continue: - Levemir 30  units at bedtime - Novolog mealtime 7-9 units 3x a day before meals - Novolog sliding scale as follows:  - 150-175: + 1 unit  - 176-200: + 2 units  - 201-225: + 3 units  - 226-250: + 4 units  - >250: + 5 units If you exercise within 2h after a meal, reduce the insulin with that meal by 25-50%.  Please return in 6 months with your sugar log.   - today, HbA1c is 6.2%  (improved) - continue checking sugars at different times of the day - check 3-4x a day, rotating checks - advised for yearly eye exams >> he is not UTD, but is planning to schedule I - Return to clinic in 6 mo with sugar log    2. HL - Reviewed latest lipid panel from 01/2018 >> all fractions at goal Lab Results  Component Value Date   CHOL 133 01/11/2018   HDL 42.10 01/11/2018   LDLCALC 79 01/11/2018   TRIG 60.0 01/11/2018   CHOLHDL 3 01/11/2018  - Continues pravastatin without side effects.  3.  Overweight -Patient lost almost 10 pounds since last visit -He tells me he improved his diet and I strongly advised him to continue with this.   Carlus Pavlov, MD PhD [facility] Endocrinology

## 2018-08-11 MED FILL — LISINOPRIL 5 MG TABLET: 5 | 30 days supply | Qty: 30 | Fill #3

## 2018-08-11 MED FILL — LEVEMIR 100 UNITS/ML VIAL: 100 | 67 days supply | Qty: 20 | Fill #2

## 2018-08-11 MED FILL — ELIQUIS 5 MG TABLET: 5 | 30 days supply | Qty: 60 | Fill #4

## 2018-08-11 MED FILL — NovoLOG 100 UNIT/ML SOLN: 100 | 67 days supply | Qty: 20 | Fill #2

## 2018-08-28 ENCOUNTER — Other Ambulatory Visit: Payer: Self-pay | Admitting: Family

## 2018-08-29 MED FILL — ATORVASTATIN 40 MG TABLET: 40 | 90 days supply | Qty: 90 | Fill #0

## 2018-08-29 MED FILL — METOPROLOL SUCCINATE ER 25: 25 | 90 days supply | Qty: 90 | Fill #0

## 2018-09-08 ENCOUNTER — Encounter: Payer: Self-pay | Admitting: Family

## 2018-09-08 ENCOUNTER — Telehealth: Payer: Self-pay | Admitting: Family

## 2018-09-08 ENCOUNTER — Ambulatory Visit (INDEPENDENT_AMBULATORY_CARE_PROVIDER_SITE_OTHER): Payer: PPO | Admitting: Family

## 2018-09-08 VITALS — BP 134/70 | HR 72 | Temp 98.2°F | Resp 16 | Ht 69.0 in | Wt 175.0 lb

## 2018-09-08 DIAGNOSIS — E119 Type 2 diabetes mellitus without complications: Secondary | ICD-10-CM | POA: Diagnosis not present

## 2018-09-08 DIAGNOSIS — Z794 Long term (current) use of insulin: Secondary | ICD-10-CM | POA: Diagnosis not present

## 2018-09-08 DIAGNOSIS — E7800 Pure hypercholesterolemia, unspecified: Secondary | ICD-10-CM | POA: Diagnosis not present

## 2018-09-08 DIAGNOSIS — I4892 Unspecified atrial flutter: Secondary | ICD-10-CM | POA: Diagnosis not present

## 2018-09-08 DIAGNOSIS — Z23 Encounter for immunization: Secondary | ICD-10-CM | POA: Diagnosis not present

## 2018-09-08 DIAGNOSIS — I10 Essential (primary) hypertension: Secondary | ICD-10-CM | POA: Diagnosis not present

## 2018-09-08 LAB — BASIC METABOLIC PANEL
BUN: 17 mg/dL (ref 6–23)
CO2: 28 mEq/L (ref 19–32)
Calcium: 9.9 mg/dL (ref 8.4–10.5)
Chloride: 100 mEq/L (ref 96–112)
Creatinine, Ser: 1.08 mg/dL (ref 0.40–1.50)
GFR: 67.15 mL/min (ref >60.00)
Glucose, Bld: 190 mg/dL — ABNORMAL HIGH (ref 70–99)
Potassium: 4.3 mEq/L (ref 3.5–5.1)
Sodium: 136 mEq/L (ref 135–145)

## 2018-09-08 NOTE — Progress Notes (Signed)
Subjective:    Patient ID: Ryan [name], male    DOB: 1946/03/31, 73 y.o.   MRN: [id number]  HPI  Patient is a 73 yr old male who presents today for follow up.  HTN- maintained on toprol xl, and lisinopril.  BP Readings from Last 3 Encounters:  09/08/18 134/70  07/13/18 (!) 146/88  03/08/18 138/88   DM2- following with endocrinology. Has eye exam on the 12th. Lab Results  Component Value Date   HGBA1C 6.2 (A) 07/13/2018   HGBA1C 6.8 (A) 01/11/2018   HGBA1C 6.7 09/12/2017   Lab Results  Component Value Date   MICROALBUR <0.7 01/11/2018   LDLCALC 79 01/11/2018   CREATININE 0.95 03/08/2018   Hyperlipidemia- maintained on lipitor.  Lab Results  Component Value Date   CHOL 133 01/11/2018   HDL 42.10 01/11/2018   LDLCALC 79 01/11/2018   TRIG 60.0 01/11/2018   CHOLHDL 3 01/11/2018   PAF- maintained on eliquis per cardiology.     Review of Systems See HPI  Past Medical History:  Diagnosis Date  . Allergy   . Diabetes mellitus   . GERD (gastroesophageal reflux disease)   . History of chicken pox   . Hyperlipidemia   . Hypertension   . Myocardial infarction ([place]) 2017  . Type 2 diabetes mellitus with hyperglycemia, with long-term current use of insulin ([place]) 09/08/2015     Social History   Socioeconomic History  . Marital status: Married    Spouse name: Not on file  . Number of children: 1  . Years of education: Not on file  . Highest education level: Not on file  Occupational History  . Not on file  Social Needs  . Financial resource strain: Not on file  . Food insecurity:    Worry: Not on file    Inability: Not on file  . Transportation needs:    Medical: Not on file    Non-medical: Not on file  Tobacco Use  . Smoking status: Former Smoker    Types: Cigarettes    Last attempt to quit: 09/01/1965    Years since quitting: 53.0  . Smokeless tobacco: Never Used  Substance and Sexual Activity  . Alcohol use: No  . Drug use: No  . Sexual activity:  Never  Lifestyle  . Physical activity:    Days per week: Not on file    Minutes per session: Not on file  . Stress: Not on file  Relationships  . Social connections:    Talks on phone: Not on file    Gets together: Not on file    Attends religious service: Not on file    Active member of club or organization: Not on file    Attends meetings of clubs or organizations: Not on file    Relationship status: Not on file  . Intimate partner violence:    Fear of current or ex partner: Not on file    Emotionally abused: Not on file    Physically abused: Not on file    Forced sexual activity: Not on file  Other Topics Concern  . Not on file  Social History Narrative   Regular exercise:  5 x weekly   Caffeine Use: 1 cups coffee daily.   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   Married- One son and grandson- both live with them   Wife has ovarian cancer             Past Surgical History:  Procedure Laterality Date  .  EYE SURGERY  1998   bilateral  . SHOULDER SURGERY  1990   right shoulder, torn rotator cuff.    Family History  Problem Relation Age of Onset  . Cancer Mother        cancer  . Heart disease Mother   . Hyperlipidemia Mother   . Hypertension Mother   . Hyperlipidemia Father   . Stroke Father   . Hypertension Father   . Diabetes Paternal Grandfather     No Known Allergies  Current Outpatient Medications on File Prior to Visit  Medication Sig Dispense Refill  . ACCU-CHEK AVIVA PLUS test strip 1 each by Other route See admin instructions. Check blood sugars 6 times daily    . ACCU-CHEK SOFTCLIX LANCETS lancets 1 each by Other route See admin instructions. Check blood sugar 6 times daily    . aspirin 81 MG tablet Take 81 mg by mouth daily.      Marland Kitchen atorvastatin (LIPITOR) 40 MG tablet TAKE 1 TABLET (40 MG TOTAL) BY MOUTH DAILY. 90 tablet 0  . Calcium Carbonate-Vitamin D (CALTRATE 600+D) 600-400 MG-UNIT per tablet Take 1 tablet by mouth 2 (two) times daily. Reported on  01/27/2016    . ELIQUIS 5 MG TABS tablet TAKE 1 TABLET (5 MG TOTAL) BY MOUTH 2 TIMES DAILY. 60 tablet 5  . insulin aspart (NOVOLOG) 100 UNIT/ML injection INJECT 10-12 UNITS INTO THE SKIN 3 TIMES DAILY WITH MEALS. UNLESS SUGAR BELOW 150 40 mL 5  . insulin detemir (LEVEMIR) 100 UNIT/ML injection INJECT 0.30 ML (30 UNITS) INTO THE SKIN AT BEDTIME 30 mL 5  . Insulin Syringe-Needle U-100 (BD INSULIN SYRINGE ULTRAFINE) 31G X 5/16" 0.5 ML MISC Use 4x a day 300 each 3  . lisinopril (PRINIVIL,ZESTRIL) 5 MG tablet TAKE 1 TABLET (5 MG TOTAL) BY MOUTH DAILY. 30 tablet 3  . metoprolol succinate (TOPROL-XL) 25 MG 24 hr tablet TAKE 1 TABLET (25 MG TOTAL) BY MOUTH DAILY. 90 tablet 0   No current facility-administered medications on file prior to visit.     BP 134/70 (BP Location: Right Arm, Patient Position: Sitting, Cuff Size: Normal)   Pulse 72   Temp 98.2 F (36.8 C) (Oral)   Resp 16   Ht '5\' 9"'$  (1.753 m)   Wt 175 lb (79.4 kg)   SpO2 98%   BMI 25.84 kg/m       Objective:   Physical Exam Constitutional:      General: He is not in acute distress.    Appearance: He is well-developed.  HENT:     Head: Normocephalic and atraumatic.  Cardiovascular:     Rate and Rhythm: Normal rate and regular rhythm.     Heart sounds: No murmur.  Pulmonary:     Effort: Pulmonary effort is normal. No respiratory distress.     Breath sounds: Normal breath sounds. No wheezing or rales.  Skin:    General: Skin is warm and dry.  Neurological:     Mental Status: He is alert and oriented to person, place, and time.  Psychiatric:        Behavior: Behavior normal.        Thought Content: Thought content normal.           Assessment & Plan:  HTN- bp stable on current meds. Obtain follow up bmet.  DM2- well controlled. Management per endocrinology. Scheduled for DM eye. Also due for pneumovax 23 booster.   Hyperlipidemia- LDL at goal on statin, continue same.   PAF-  rate controlled. On Eliquis, management  per cardiology.

## 2018-09-08 NOTE — Addendum Note (Signed)
Addended by: Steve Rattler A on: 09/08/2018 12:02 PM   Modules accepted: Orders

## 2018-09-08 NOTE — Telephone Encounter (Signed)
Please initiate cologuard screening for this patient.

## 2018-09-11 NOTE — Telephone Encounter (Signed)
Form filled out and faxed to [employer] with insurance information

## 2018-09-12 ENCOUNTER — Other Ambulatory Visit: Payer: Self-pay | Admitting: Family

## 2018-09-12 DIAGNOSIS — I10 Essential (primary) hypertension: Secondary | ICD-10-CM

## 2018-09-12 MED FILL — ELIQUIS 5 MG TABLET: 5 | 30 days supply | Qty: 60 | Fill #5

## 2018-09-13 MED FILL — LISINOPRIL 5 MG TABLET: 5 | 30 days supply | Qty: 30 | Fill #0

## 2018-09-20 DIAGNOSIS — Z961 Presence of intraocular lens: Secondary | ICD-10-CM | POA: Diagnosis not present

## 2018-09-20 DIAGNOSIS — H26493 Other secondary cataract, bilateral: Secondary | ICD-10-CM | POA: Diagnosis not present

## 2018-09-20 DIAGNOSIS — E119 Type 2 diabetes mellitus without complications: Secondary | ICD-10-CM | POA: Diagnosis not present

## 2018-09-20 LAB — HM DIABETES EYE EXAM

## 2018-09-27 DIAGNOSIS — Z1212 Encounter for screening for malignant neoplasm of rectum: Secondary | ICD-10-CM | POA: Diagnosis not present

## 2018-09-27 DIAGNOSIS — Z1211 Encounter for screening for malignant neoplasm of colon: Secondary | ICD-10-CM | POA: Diagnosis not present

## 2018-10-01 LAB — COLOGUARD: Cologuard: NEGATIVE

## 2018-10-04 DIAGNOSIS — H26492 Other secondary cataract, left eye: Secondary | ICD-10-CM | POA: Diagnosis not present

## 2018-10-09 ENCOUNTER — Telehealth: Payer: Self-pay | Admitting: Family

## 2018-10-09 NOTE — Telephone Encounter (Signed)
See mychart.  

## 2018-10-11 ENCOUNTER — Other Ambulatory Visit: Payer: Self-pay | Admitting: Internal Medicine

## 2018-10-11 MED FILL — ELIQUIS 5 MG TABLET: 5 | 30 days supply | Qty: 60 | Fill #0

## 2018-10-11 MED FILL — LISINOPRIL 5 MG TABLET: 5 | 30 days supply | Qty: 30 | Fill #1

## 2018-10-11 NOTE — Telephone Encounter (Signed)
Eliquis 5mg  refill request received; pt is 73 yrs old, wt-79.4kg, Crea-1.08 on 08/11/2018, last seen by Dr. Tenny Craw on 10/06/2017-pt needs an appt for f/u; will send in a month a call pt to have her schedule an appt.  Called pt to inform him that he needs to schedule a follow up appt with Dr. Tenny Craw, however, had to leave a message and stated that a one month supply was sent today. Gave the number to call back to schedule the appt as well.

## 2018-10-19 MED FILL — LEVEMIR 100 UNITS/ML VIAL: 100 | 67 days supply | Qty: 20 | Fill #3

## 2018-10-19 MED FILL — NovoLOG 100 UNIT/ML SOLN: 100 | 67 days supply | Qty: 20 | Fill #3

## 2018-10-30 ENCOUNTER — Telehealth: Payer: Self-pay

## 2018-10-30 NOTE — Telephone Encounter (Signed)
   Cardiac Questionnaire:    Since your last visit or hospitalization:    1. Have you been having new or worsening chest pain? NO   2. Have you been having new or worsening shortness of breath? NO 3. Have you been having new or worsening leg swelling, wt gain, or increase in abdominal girth (pants fitting more tightly)? NO   4. Have you had any passing out spells? NO    *A YES to any of these questions would result in the appointment being kept. *If all the answers to these questions are NO, we should indicate that given the current situation regarding the worldwide coronarvirus pandemic, at the recommendation of the CDC, we are looking to limit gatherings in our waiting area, and thus will reschedule their appointment beyond four weeks from today.   _____________   COVID-19 Pre-Screening Questions:  . Do you currently have a fever? NO (yes = cancel and refer to pcp for e-visit) . Have you recently travelled on a cruise, internationally, or to [place], [place], [place], [place], [place], or [place], [place] ([place]) ? NO (yes = cancel, stay home, monitor symptoms, and contact pcp or initiate e-visit if symptoms develop) . Have you been in contact with someone that is currently pending confirmation of Covid19 testing or has been confirmed to have the Covid19 virus?  NO (yes = cancel, stay home, away from tested individual, monitor symptoms, and contact pcp or initiate e-visit if symptoms develop) . Are you currently experiencing fatigue or cough? NO (yes = pt should be prepared to have a mask placed at the time of their visit).   Appointment Cancelled due to Coronavirus:  Called patient in regards to f/u appointment with Nina Hammond, NP on 11/06/2018.   Patient denies having any chest pain, SOB, cough, fever, or any other Sx.   Patient was okay with cancelling appointment and has been made aware that they will be contacted in the near future to reschedule.   Refills have been sent in if needed.   Patient  understands to let us know if they develop any Sx before then. .   

## 2018-10-31 ENCOUNTER — Telehealth: Payer: Self-pay | Admitting: Cardiology

## 2018-10-31 NOTE — Telephone Encounter (Signed)
   Primary Cardiologist:  Dietrich Pates, MD   Patient contacted.  History reviewed.  No symptoms to suggest any unstable cardiac conditions.  Based on discussion, with current pandemic situation, the patient agrees to a virtual visit and he has been scheduled for 11/02/2018 at 10:00. The patient was told to check his BP, pulse and weight prior to the visit and he agrees.     Berton Bon, NP  10/31/2018 4:13 PM         .

## 2018-11-02 ENCOUNTER — Encounter: Payer: Self-pay | Admitting: Cardiology

## 2018-11-02 ENCOUNTER — Telehealth (INDEPENDENT_AMBULATORY_CARE_PROVIDER_SITE_OTHER): Payer: PPO | Admitting: Cardiology

## 2018-11-02 ENCOUNTER — Other Ambulatory Visit: Payer: Self-pay

## 2018-11-02 VITALS — BP 128/74 | HR 60 | Temp 98.2°F | Ht 69.0 in | Wt 172.6 lb

## 2018-11-02 DIAGNOSIS — E7800 Pure hypercholesterolemia, unspecified: Secondary | ICD-10-CM

## 2018-11-02 DIAGNOSIS — E785 Hyperlipidemia, unspecified: Secondary | ICD-10-CM | POA: Diagnosis not present

## 2018-11-02 DIAGNOSIS — I10 Essential (primary) hypertension: Secondary | ICD-10-CM

## 2018-11-02 DIAGNOSIS — I4892 Unspecified atrial flutter: Secondary | ICD-10-CM | POA: Diagnosis not present

## 2018-11-02 DIAGNOSIS — Z794 Long term (current) use of insulin: Secondary | ICD-10-CM

## 2018-11-02 DIAGNOSIS — Z7901 Long term (current) use of anticoagulants: Secondary | ICD-10-CM

## 2018-11-02 DIAGNOSIS — E119 Type 2 diabetes mellitus without complications: Secondary | ICD-10-CM

## 2018-11-02 NOTE — Patient Instructions (Signed)
Medication Instructions:  Your physician recommends that you continue on your current medications as directed. Please refer to the Current Medication list given to you today.  If you need a refill on your cardiac medications before your next appointment, please call your pharmacy.   Lab work: FUTURE LAB WORK IN 4 WEEKS: BMET & CBC (can be done at our office or by your pcp) If we are still on lock down due to COVID-19 DO NOT come into the office we will get labs after restrictions are lifted  If you have labs (blood work) drawn today and your tests are completely normal, you will receive your results only by: Marland Kitchen MyChart Message (if you have MyChart) OR . A paper copy in the mail If you have any lab test that is abnormal or we need to change your treatment, we will call you to review the results.  Testing/Procedures: None  Follow-Up: At [facility], you and your health needs are our priority.  As part of our continuing mission to provide you with exceptional heart care, we have created designated Provider Care Teams.  These Care Teams include your primary Cardiologist (physician) and Advanced Practice Providers (APPs -  Physician Assistants and Nurse Practitioners) who all work together to provide you with the care you need, when you need it. You will need a follow up appointment in:  1 years.  Please call our office 2 months in advance to schedule this appointment.  You may see Dorris Carnes, MD or one of the following Advanced Practice Providers on your designated Care Team: Richardson Dopp, PA-C [place], [place] . Daune Perch, NP  Any Other Special Instructions Will Be Listed Below (If Applicable).  DASH Eating Plan DASH stands for "Dietary Approaches to Stop Hypertension." The DASH eating plan is a healthy eating plan that has been shown to reduce high blood pressure (hypertension). It may also reduce your risk for type 2 diabetes, heart disease, and stroke. The DASH eating plan may also  help with weight loss. What are tips for following this plan?  General guidelines  Avoid eating more than 2,300 mg (milligrams) of salt (sodium) a day. If you have hypertension, you may need to reduce your sodium intake to 1,500 mg a day.  Limit alcohol intake to no more than 1 drink a day for nonpregnant women and 2 drinks a day for men. One drink equals 12 oz of beer, 5 oz of wine, or 1 oz of hard liquor.  Work with your health care provider to maintain a healthy body weight or to lose weight. Ask what an ideal weight is for you.  Get at least 30 minutes of exercise that causes your heart to beat faster (aerobic exercise) most days of the week. Activities may include walking, swimming, or biking.  Work with your health care provider or diet and nutrition specialist (dietitian) to adjust your eating plan to your individual calorie needs. Reading food labels   Check food labels for the amount of sodium per serving. Choose foods with less than 5 percent of the Daily Value of sodium. Generally, foods with less than 300 mg of sodium per serving fit into this eating plan.  To find whole grains, look for the word "whole" as the first word in the ingredient list. Shopping  Buy products labeled as "low-sodium" or "no salt added."  Buy fresh foods. Avoid canned foods and premade or frozen meals. Cooking  Avoid adding salt when cooking. Use salt-free seasonings or herbs  instead of table salt or sea salt. Check with your health care provider or pharmacist before using salt substitutes.  Do not fry foods. Cook foods using healthy methods such as baking, boiling, grilling, and broiling instead.  Cook with heart-healthy oils, such as olive, canola, soybean, or sunflower oil. Meal planning  Eat a balanced diet that includes: ? 5 or more servings of fruits and vegetables each day. At each meal, try to fill half of your plate with fruits and vegetables. ? Up to 6-8 servings of whole grains each  day. ? Less than 6 oz of lean meat, poultry, or fish each day. A 3-oz serving of meat is about the same size as a deck of cards. One egg equals 1 oz. ? 2 servings of low-fat dairy each day. ? A serving of nuts, seeds, or beans 5 times each week. ? Heart-healthy fats. Healthy fats called Omega-3 fatty acids are found in foods such as flaxseeds and coldwater fish, like sardines, salmon, and mackerel.  Limit how much you eat of the following: ? Canned or prepackaged foods. ? Food that is high in trans fat, such as fried foods. ? Food that is high in saturated fat, such as fatty meat. ? Sweets, desserts, sugary drinks, and other foods with added sugar. ? Full-fat dairy products.  Do not salt foods before eating.  Try to eat at least 2 vegetarian meals each week.  Eat more home-cooked food and less restaurant, buffet, and fast food.  When eating at a restaurant, ask that your food be prepared with less salt or no salt, if possible. What foods are recommended? The items listed may not be a complete list. Talk with your dietitian about what dietary choices are best for you. Grains Whole-grain or whole-wheat bread. Whole-grain or whole-wheat pasta. Brown rice. [place]. Bulgur. Whole-grain and low-sodium cereals. Pita bread. Low-fat, low-sodium crackers. Whole-wheat flour tortillas. Vegetables Fresh or frozen vegetables (raw, steamed, roasted, or grilled). Low-sodium or reduced-sodium tomato and vegetable juice. Low-sodium or reduced-sodium tomato sauce and tomato paste. Low-sodium or reduced-sodium canned vegetables. Fruits All fresh, dried, or frozen fruit. Canned fruit in natural juice (without added sugar). Meat and other protein foods Skinless chicken or [place]. Ground chicken or [place]. Pork with fat trimmed off. Fish and seafood. Egg whites. Dried beans, peas, or lentils. Unsalted nuts, nut butters, and seeds. Unsalted canned beans. Lean cuts of beef with fat trimmed off.  Low-sodium, lean deli meat. Dairy Low-fat (1%) or fat-free (skim) milk. Fat-free, low-fat, or reduced-fat cheeses. Nonfat, low-sodium ricotta or cottage cheese. Low-fat or nonfat yogurt. Low-fat, low-sodium cheese. Fats and oils Soft margarine without trans fats. Vegetable oil. Low-fat, reduced-fat, or light mayonnaise and salad dressings (reduced-sodium). Canola, safflower, olive, soybean, and sunflower oils. Avocado. Seasoning and other foods Herbs. Spices. Seasoning mixes without salt. Unsalted popcorn and pretzels. Fat-free sweets. What foods are not recommended? The items listed may not be a complete list. Talk with your dietitian about what dietary choices are best for you. Grains Baked goods made with fat, such as croissants, muffins, or some breads. Dry pasta or rice meal packs. Vegetables Creamed or fried vegetables. Vegetables in a cheese sauce. Regular canned vegetables (not low-sodium or reduced-sodium). Regular canned tomato sauce and paste (not low-sodium or reduced-sodium). Regular tomato and vegetable juice (not low-sodium or reduced-sodium). Angie Fava. Olives. Fruits Canned fruit in a light or heavy syrup. Fried fruit. Fruit in cream or butter sauce. Meat and other protein foods Fatty cuts of meat. Ribs. Maceo Pro  meat. Berniece Salines. Sausage. Bologna and other processed lunch meats. Salami. Fatback. Hotdogs. Bratwurst. Salted nuts and seeds. Canned beans with added salt. Canned or smoked fish. Whole eggs or egg yolks. Chicken or [place] with skin. Dairy Whole or 2% milk, cream, and half-and-half. Whole or full-fat cream cheese. Whole-fat or sweetened yogurt. Full-fat cheese. Nondairy creamers. Whipped toppings. Processed cheese and cheese spreads. Fats and oils Butter. Stick margarine. Lard. Shortening. Ghee. Bacon fat. Tropical oils, such as coconut, palm kernel, or palm oil. Seasoning and other foods Salted popcorn and pretzels. Onion salt, garlic salt, seasoned salt, table salt, and sea  salt. Worcestershire sauce. Tartar sauce. Barbecue sauce. Teriyaki sauce. Soy sauce, including reduced-sodium. Steak sauce. Canned and packaged gravies. Fish sauce. Oyster sauce. Cocktail sauce. Horseradish that you find on the shelf. Ketchup. Mustard. Meat flavorings and tenderizers. Bouillon cubes. Hot sauce and Tabasco sauce. Premade or packaged marinades. Premade or packaged taco seasonings. Relishes. Regular salad dressings. Where to find more information:  National Heart, Lung, and [place]: https://wilson-eaton.com/  American Heart Association: www.heart.org Summary  The DASH eating plan is a healthy eating plan that has been shown to reduce high blood pressure (hypertension). It may also reduce your risk for type 2 diabetes, heart disease, and stroke.  With the DASH eating plan, you should limit salt (sodium) intake to 2,300 mg a day. If you have hypertension, you may need to reduce your sodium intake to 1,500 mg a day.  When on the DASH eating plan, aim to eat more fresh fruits and vegetables, whole grains, lean proteins, low-fat dairy, and heart-healthy fats.  Work with your health care provider or diet and nutrition specialist (dietitian) to adjust your eating plan to your individual calorie needs. This information is not intended to replace advice given to you by your health care provider. Make sure you discuss any questions you have with your health care provider. Document Released: 07/15/2011 Document Revised: 07/19/2016 Document Reviewed: 07/19/2016 Elsevier Interactive Patient Education  2019 [employer].

## 2018-11-02 NOTE — Progress Notes (Signed)
Virtual Visit via Telephone Note    Evaluation Performed:  Follow-up visit  This visit type was conducted due to national recommendations for restrictions regarding the COVID-19 Pandemic (e.g. social distancing).  This format is felt to be most appropriate for this patient at this time.  All issues noted in this document were discussed and addressed.  No physical exam was performed (except for noted visual exam findings with Video Visits).  Please refer to the patient's chart (MyChart message for video visits and phone note for telephone visits) for the patient's consent to telehealth for [facility].  Date:  11/02/2018   ID:  Ryan [name], DOB [birth year], MRN [id number]  Patient Location:  [place] [address]   Provider location:   [place], [place] [address]  PCP:  [name], NP  Cardiologist:  Dorris Carnes, MD  Electrophysiologist:  None   Chief Complaint:  Follow up afib  History of Present Illness:    Ryan [name] is a 73 y.o. male who presents via audio conferencing for a telehealth visit today.  Pt has hx of aflutter on Eliquis. No chest pain/pressure/discomfort, shortness of breath, palpitations, lightheadedness or syncope. He says he feels great. He is trying to reduce his belly fat with situps but this hurts his back. We discussed diet and cardio exercise to help reduce his insulin resistance and belly fat.   The patient does not symptoms concerning for COVID-19 infection (fever, chills, cough, or new SHORTNESS OF BREATH).    Prior CV studies:   The following studies were reviewed today:  Normal stress test 03/11/2016 Echo 01/28/2016: EF 55-60%, mild MR, mild concentric LVH  Past Medical History:  Diagnosis Date  . Allergy   . Diabetes mellitus   . GERD (gastroesophageal reflux disease)   . History of chicken pox   . Hyperlipidemia   . Hypertension   . Myocardial infarction ([place]) 2017  . Type 2 diabetes mellitus with  hyperglycemia, with long-term current use of insulin ([place]) 09/08/2015   Past Surgical History:  Procedure Laterality Date  . EYE SURGERY  1998   bilateral  . SHOULDER SURGERY  1990   right shoulder, torn rotator cuff.     Current Meds  Medication Sig  . ACCU-CHEK AVIVA PLUS test strip 1 each by Other route See admin instructions. Check blood sugars 6 times daily  . ACCU-CHEK SOFTCLIX LANCETS lancets 1 each by Other route See admin instructions. Check blood sugar 6 times daily  . aspirin 81 MG tablet Take 81 mg by mouth daily.    Marland Kitchen atorvastatin (LIPITOR) 40 MG tablet TAKE 1 TABLET (40 MG TOTAL) BY MOUTH DAILY.  . Calcium Carbonate-Vitamin D (CALTRATE 600+D) 600-400 MG-UNIT per tablet Take 1 tablet by mouth 2 (two) times daily. Reported on 01/27/2016  . ELIQUIS 5 MG TABS tablet TAKE 1 TABLET (5 MG TOTAL) BY MOUTH 2 TIMES DAILY.  Marland Kitchen insulin aspart (NOVOLOG) 100 UNIT/ML injection INJECT 10-12 UNITS INTO THE SKIN 3 TIMES DAILY WITH MEALS. UNLESS SUGAR BELOW 150  . insulin detemir (LEVEMIR) 100 UNIT/ML injection INJECT 0.30 ML (30 UNITS) INTO THE SKIN AT BEDTIME  . Insulin Syringe-Needle U-100 (BD INSULIN SYRINGE ULTRAFINE) 31G X 5/16" 0.5 ML MISC Use 4x a day  . lisinopril (PRINIVIL,ZESTRIL) 5 MG tablet TAKE 1 TABLET BY MOUTH DAILY.  . metoprolol succinate (TOPROL-XL) 25 MG 24 hr tablet TAKE 1 TABLET (25 MG TOTAL) BY MOUTH DAILY.     Allergies:  Patient has no known allergies.   Social History   Tobacco Use  . Smoking status: Former Smoker    Types: Cigarettes    Last attempt to quit: 09/01/1965    Years since quitting: 53.2  . Smokeless tobacco: Never Used  Substance Use Topics  . Alcohol use: No  . Drug use: No     Family Hx: The patient's family history includes Cancer in his mother; Diabetes in his paternal grandfather; Heart disease in his mother; Hyperlipidemia in his father and mother; Hypertension in his father and mother; Stroke in his father.  ROS:   Please see the  history of present illness.     All other systems reviewed and are negative.   Labs/Other Tests and Data Reviewed:    Recent Labs: 09/08/2018: BUN 17; Creatinine, Ser 1.08; Potassium 4.3; Sodium 136   Recent Lipid Panel Lab Results  Component Value Date/Time   CHOL 133 01/11/2018 11:03 AM   TRIG 60.0 01/11/2018 11:03 AM   HDL 42.10 01/11/2018 11:03 AM   CHOLHDL 3 01/11/2018 11:03 AM   LDLCALC 79 01/11/2018 11:03 AM    Wt Readings from Last 3 Encounters:  11/02/18 172 lb 9.6 oz (78.3 kg)  09/08/18 175 lb (79.4 kg)  07/13/18 173 lb (78.5 kg)     Exam:    Vital Signs:  BP 128/74   Pulse 60   Temp 98.2 F (36.8 C)   Ht 5' 9"$  (1.753 m)   Wt 172 lb 9.6 oz (78.3 kg)   BMI 25.49 kg/m    Pt in no acute distress. Pulmonary: speaking with ease and no shortness of breath noted with talking over the phone. No cough noted.  Neuro: Alert, oriented and conversant answering all questions appropriately.  ASSESSMENT & PLAN:    1.  Paroxysmal atrial flutter  -On Eliquis fro stroke risk reduction. Last CBC was in 09/2017. Pt will need CBC but with current COVID 19 restrictions, will defer to when the restrictions are lifted.  -No symptoms of recurrent aflutter.  -No unusual bleeding. Pt reports that he had a normal cologuard in 09/2018.  -Contiue current therapy.   2.  Hypertension -On lisinopril, metoprolol succinate -BP well controlled. Pt checks it every day.  -SCr was normal at 1.08 in 08/2018.   3. Hyperlipidemia -LDL was 79 in 01/2018. Good control on atorvastatin.   4. DM type 2 on insulin -A1c 6.2 in 07/2018. Well controlled at <7.     COVID-19 Education: The signs and symptoms of COVID-19 were discussed with the patient and how to seek care for testing (follow up with PCP or arrange E-visit).  The importance of social distancing was discussed today.  Patient Risk:   After full review of this patients clinical status, I feel that they are at least moderate risk at this  time.  Time:   Today, I have spent 12 minutes with the patient with telehealth technology discussing Afib, diet exercise,  Cholesterol, DM, meds, labs.     Medication Adjustments/Labs and Tests Ordered: Current medicines are reviewed at length with the patient today.  Concerns regarding medicines are outlined above.  Tests Ordered: Orders Placed This Encounter  Procedures  . CBC  . Basic metabolic panel   Medication Changes: No orders of the defined types were placed in this encounter.   Disposition:  in 10 month(s)  Signed, Daune Perch, NP  11/02/2018 10:39 AM    [place]

## 2018-11-02 NOTE — Telephone Encounter (Signed)
Pt had a virtual visit yesterday. Can remove from pool.

## 2018-11-06 ENCOUNTER — Ambulatory Visit: Payer: PPO | Admitting: Cardiology

## 2018-11-13 ENCOUNTER — Other Ambulatory Visit: Payer: Self-pay | Admitting: Internal Medicine

## 2018-11-13 MED FILL — ELIQUIS 5 MG TABLET: 5 | 30 days supply | Qty: 60 | Fill #0

## 2018-11-13 MED FILL — LISINOPRIL 5 MG TABLET: 5 | 30 days supply | Qty: 30 | Fill #2

## 2018-11-13 NOTE — Telephone Encounter (Signed)
Pt last saw Berton Bon, NP on 11/02/18, last labs 09/07/18 Creat 1.08, age 73, weight 78.3kg, based on specified criteria pt is on appropriate dosage of Eliquis 5mg  BID.  Will refill rx.

## 2018-11-27 ENCOUNTER — Other Ambulatory Visit: Payer: Self-pay | Admitting: Family

## 2018-11-27 MED FILL — ATORVASTATIN 40 MG TABLET: 40 | 90 days supply | Qty: 90 | Fill #0

## 2018-11-27 MED FILL — METOPROLOL SUCCINATE ER 25: 25 | 90 days supply | Qty: 90 | Fill #0

## 2018-12-01 ENCOUNTER — Ambulatory Visit: Payer: PPO | Admitting: *Deleted

## 2018-12-12 MED FILL — ELIQUIS 5 MG TABLET: 5 | 30 days supply | Qty: 60 | Fill #1

## 2018-12-12 MED FILL — LISINOPRIL 5 MG TABLET: 5 | 30 days supply | Qty: 30 | Fill #3

## 2018-12-25 MED FILL — LEVEMIR 100 UNITS/ML VIAL: 100 | 67 days supply | Qty: 20 | Fill #4

## 2018-12-25 MED FILL — NovoLOG 100 UNIT/ML SOLN: 100 | 67 days supply | Qty: 20 | Fill #4

## 2019-01-10 ENCOUNTER — Other Ambulatory Visit: Payer: Self-pay

## 2019-01-10 ENCOUNTER — Other Ambulatory Visit: Payer: Self-pay | Admitting: Family

## 2019-01-10 DIAGNOSIS — I10 Essential (primary) hypertension: Secondary | ICD-10-CM

## 2019-01-10 MED FILL — ELIQUIS 5 MG TABLET: 5 | 30 days supply | Qty: 60 | Fill #2

## 2019-01-10 MED FILL — LISINOPRIL 5 MG TABLET: 5 | 30 days supply | Qty: 30 | Fill #0

## 2019-01-12 ENCOUNTER — Ambulatory Visit: Payer: HMO | Admitting: Internal Medicine

## 2019-01-12 ENCOUNTER — Encounter: Payer: Self-pay | Admitting: Internal Medicine

## 2019-01-12 ENCOUNTER — Other Ambulatory Visit: Payer: Self-pay

## 2019-01-12 VITALS — BP 140/80 | HR 69 | Ht 69.0 in | Wt 176.0 lb

## 2019-01-12 DIAGNOSIS — E663 Overweight: Secondary | ICD-10-CM | POA: Diagnosis not present

## 2019-01-12 DIAGNOSIS — E1159 Type 2 diabetes mellitus with other circulatory complications: Secondary | ICD-10-CM

## 2019-01-12 DIAGNOSIS — E785 Hyperlipidemia, unspecified: Secondary | ICD-10-CM | POA: Diagnosis not present

## 2019-01-12 DIAGNOSIS — E1165 Type 2 diabetes mellitus with hyperglycemia: Secondary | ICD-10-CM

## 2019-01-12 LAB — POCT GLYCOSYLATED HEMOGLOBIN (HGB A1C): Hemoglobin A1C: 6.8 % — AB (ref 4.0–5.6)

## 2019-01-12 NOTE — Progress Notes (Signed)
Patient ID: Ryan [name], male   DOB: 1945/09/30, 73 y.o.   MRN: [id number]  HPI: Ryan [name] is a 73 y.o.-year-old male, returning for f/u for DM2 (?1), dx 1999, insulin-dependent since 2005, uncontrolled, with  long-term complications (CAD - s/p AMI 2017). Last visit 6 months ago.  Before last visit, he started to change his diet and his sugars improved.  He also lost 10 pounds. He continues to limit portions, but he gained a little bit of weight since last visit and sugars are slightly higher.  Last hemoglobin A1c was: Lab Results  Component Value Date   HGBA1C 6.2 (A) 07/13/2018   HGBA1C 6.8 (A) 01/11/2018   HGBA1C 6.7 09/12/2017  06/10/2016: HbA1c calculated from the fructosamine is 6.45%, higher. 03/09/2016: HbA1c calculated from the fructosamine is great, at 5.9%! 01/27/2016: HbA1c calculated from fructosamine is 6.4%  He is now on: - Levemir 30 units at bedtime - Novolog mealtime 7-10 units 3x a day before meals - Novolog sliding scale as follows:  - 150-175: + 1 unit  - 176-200: + 2 units  - 201-225: + 3 units  - 226-250: + 4 units  - >250: + 5 units If you exercise within 2h after a meal, reduce the insulin with that meal by 25-50%.  Was on Metformin 1000 mg with dinner (added 08/2015 >> now off) He was on an insulin pump after dx. He was taken off the pump ~2005 by the [place] as this was not covered anymore.   Pt checks his sugars 6-8 times a day per his exemplary log - am:  80-162, 197 (insomnia) >> 80-168 >> 78-175 - 2h after b'fast: 80-126 >> 82-130 >> 86-120 - before lunch: 80-118, 120 >> 82-114, 147 >> 75-118,142 - 2h after lunch: 79-122 >> 80-139 >> 72-116 - before dinner: 80-134 >> 80-112, 140 >> 70, 79-129, 136 - 2h after dinner: 80-150 >> 80-123 >> 77-143 - bedtime: 82-160 >> 80-150 >> 72-136, 166 - nighttime: 132, 163 >> 80-150 >> 94 Lowest sugar was 43 on 04/04/2017 >> ...60 >> 70. he has hypoglycemia awareness in the 60s. Highest sugar was  200 (sick) >>  175  Pt's meals are: - Breakfast: egg beaters + toast - Lunch: cold cuts or salads - Dinner: meat + veggies - Snacks: fruit  He exercises daily: Strength exercises and walking on treadmill.  -No CKD: Lab Results  Component Value Date   BUN 17 09/08/2018   CREATININE 1.08 09/08/2018  On lisinopril. -+ HL; last set of lipids: Lab Results  Component Value Date   CHOL 133 01/11/2018   HDL 42.10 01/11/2018   LDLCALC 79 01/11/2018   TRIG 60.0 01/11/2018   CHOLHDL 3 01/11/2018  On pravastatin. - last eye exam was in 2020 (Dr. Katy Fitch): No DR - no numbness and tingling in his feet.   In 2016, he had 3 deaths in the family within 4 months: His mother, his father, and his wife (ovarian cancer). He is taking care of his grandson. His son and his daughter in law are both on disability for illness.   He has  A flutter  >> saw Dr. [name] >> stress test was normal .  He is on Eliquis.  ROS: Constitutional: + weight gain/no weight loss, no fatigue, no subjective hyperthermia, no subjective hypothermia Eyes: no blurry vision, no xerophthalmia ENT: no sore throat, no nodules palpated in neck, no dysphagia, no odynophagia, no hoarseness Cardiovascular: no CP/no SOB/no palpitations/no leg swelling Respiratory: no cough/no  SOB/no wheezing Gastrointestinal: no N/no V/no D/no C/no acid reflux Musculoskeletal: no muscle aches/no joint aches Skin: no rashes, no hair loss Neurological: no tremors/no numbness/no tingling/no dizziness  I reviewed pt's medications, allergies, PMH, social hx, family hx, and changes were documented in the history of present illness. Otherwise, unchanged from my initial visit note.  Past Medical History:  Diagnosis Date  . Allergy   . Diabetes mellitus   . GERD (gastroesophageal reflux disease)   . History of chicken pox   . Hyperlipidemia   . Hypertension   . Myocardial infarction ([place]) 2017  . Type 2 diabetes mellitus with hyperglycemia, with long-term current  use of insulin ([place]) 09/08/2015   Past Surgical History:  Procedure Laterality Date  . EYE SURGERY  1998   bilateral  . SHOULDER SURGERY  1990   right shoulder, torn rotator cuff.   Social History   Socioeconomic History  . Marital status: Married    Spouse name: Not on file  . Number of children: 1  . Years of education: Not on file  . Highest education level: Not on file  Occupational History  . Not on file  Social Needs  . Financial resource strain: Not on file  . Food insecurity:    Worry: Not on file    Inability: Not on file  . Transportation needs:    Medical: Not on file    Non-medical: Not on file  Tobacco Use  . Smoking status: Former Smoker    Types: Cigarettes    Last attempt to quit: 09/01/1965    Years since quitting: 53.4  . Smokeless tobacco: Never Used  Substance and Sexual Activity  . Alcohol use: No  . Drug use: No  . Sexual activity: Never  Lifestyle  . Physical activity:    Days per week: Not on file    Minutes per session: Not on file  . Stress: Not on file  Relationships  . Social connections:    Talks on phone: Not on file    Gets together: Not on file    Attends religious service: Not on file    Active member of club or organization: Not on file    Attends meetings of clubs or organizations: Not on file    Relationship status: Not on file  . Intimate partner violence:    Fear of current or ex partner: Not on file    Emotionally abused: Not on file    Physically abused: Not on file    Forced sexual activity: Not on file  Other Topics Concern  . Not on file  Social History Narrative   Regular exercise:  5 x weekly   Caffeine Use: 1 cups coffee daily.   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   Married- One son and grandson- both live with them   Wife has ovarian cancer            Current Outpatient Medications on File Prior to Visit  Medication Sig Dispense Refill  . ACCU-CHEK AVIVA PLUS test strip 1 each by Other route See admin  instructions. Check blood sugars 6 times daily    . ACCU-CHEK SOFTCLIX LANCETS lancets 1 each by Other route See admin instructions. Check blood sugar 6 times daily    . apixaban (ELIQUIS) 5 MG TABS tablet Take 1 tablet (5 mg total) by mouth 2 (two) times daily. 60 tablet 6  . aspirin 81 MG tablet Take 81 mg by mouth daily.      Marland Kitchen  atorvastatin (LIPITOR) 40 MG tablet TAKE 1 TABLET BY MOUTH ONCE DAILY 90 tablet 0  . Calcium Carbonate-Vitamin D (CALTRATE 600+D) 600-400 MG-UNIT per tablet Take 1 tablet by mouth 2 (two) times daily. Reported on 01/27/2016    . insulin aspart (NOVOLOG) 100 UNIT/ML injection INJECT 10-12 UNITS INTO THE SKIN 3 TIMES DAILY WITH MEALS. UNLESS SUGAR BELOW 150 40 mL 5  . insulin detemir (LEVEMIR) 100 UNIT/ML injection INJECT 0.30 ML (30 UNITS) INTO THE SKIN AT BEDTIME 30 mL 5  . Insulin Syringe-Needle U-100 (BD INSULIN SYRINGE ULTRAFINE) 31G X 5/16" 0.5 ML MISC Use 4x a day 300 each 3  . lisinopril (ZESTRIL) 5 MG tablet TAKE 1 TABLET BY MOUTH DAILY. 30 tablet 3  . metoprolol succinate (TOPROL-XL) 25 MG 24 hr tablet TAKE 1 TABLET BY MOUTH ONCE DAILY 90 tablet 0   No current facility-administered medications on file prior to visit.    No Known Allergies Family History  Problem Relation Age of Onset  . Cancer Mother        cancer  . Heart disease Mother   . Hyperlipidemia Mother   . Hypertension Mother   . Hyperlipidemia Father   . Stroke Father   . Hypertension Father   . Diabetes Paternal Grandfather     PE: BP 140/80   Pulse 69   Ht '5\' 9"'$  (1.753 m)   Wt 176 lb (79.8 kg)   SpO2 96%   BMI 25.99 kg/m  Body mass index is 25.99 kg/m.    Wt Readings from Last 3 Encounters:  01/12/19 176 lb (79.8 kg)  11/02/18 172 lb 9.6 oz (78.3 kg)  09/08/18 175 lb (79.4 kg)   Constitutional: Slightly overweight, in NAD Eyes: PERRLA, EOMI, no exophthalmos ENT: moist mucous membranes, no thyromegaly, no cervical lymphadenopathy Cardiovascular: RRR, No MRG Respiratory: CTA  B Gastrointestinal: abdomen soft, NT, ND, BS+ Musculoskeletal: no deformities, strength intact in all 4 Skin: moist, warm, no rashes Neurological: no tremor with outstretched hands, DTR normal in all 4   ASSESSMENT: 1. DM2 or 1, insulin-dependent, fairly well controlled, with long-term complications, but with occasional hypo and hyper-glycemia - He had to stop Metformin b/c GI sxs >> will keep off as sugars are at or close to goal  - CAD, s/p AMI  2. HL  3.  Overweight  PLAN:  1. Patient with longstanding, uncontrolled, diabetes, with better control recently.  He is on a basal-bolus insulin regimen, with lower postprandial CBGs at previous visits, after which we reduced his NovoLog with meals.  At last visit, reviewing his great sugar log, his glucose levels were almost all at goal.  He only had occasional spikes in the morning, in the 150s in the 170s, usually after a night in which he did not sleep well.  He has PTSD from the time spent in the [employer]. -At this visit, sugars are slightly higher in the morning, but not consistently.  He has spikes in the 150s to 170s, but the majority of the sugars continue to remain at goal.  He feels that the high blood sugars are again when he is not sleeping well.  After meals, his sugars can be lower than before meals or stable so I advised him to use the lower range of the NovoLog doses.  No changes needed in his Levemir dose -We will continue the current regimen for now - I suggested to:  Patient Instructions  Please  continue: - Levemir 30 units at bedtime - Novolog  mealtime 7-9 units 3x a day before meals - Novolog sliding scale as follows:  - 150-175: + 1 unit  - 176-200: + 2 units  - 201-225: + 3 units  - 226-250: + 4 units  - >250: + 5 units If you exercise within 2h after a meal, reduce the insulin with that meal by 25-50%.  Please return in 6 months with your sugar log.   - today, HbA1c is 6.8% (higher) - continue checking sugars at  different times of the day - check 3-4x a day, rotating checks - advised for yearly eye exams >> he is UTD - Return to clinic in 6 mo with sugar log     2. HL - Reviewed latest lipid panel from 01/2018:  Lab Results  Component Value Date   CHOL 133 01/11/2018   HDL 42.10 01/11/2018   LDLCALC 79 01/11/2018   TRIG 60.0 01/11/2018   CHOLHDL 3 01/11/2018  - Continues pravastatin without side effects. - will have a new Lipid panel in July at [facility] with PCP  3.  Overweight -Before last visit, patient lost approximately 10 pounds by improving his diet -Since then, he gained 3 pounds back  [name], MD PhD [facility]

## 2019-01-12 NOTE — Patient Instructions (Addendum)
Please  continue: - Levemir 30 units at bedtime - Novolog mealtime 7-10 units 3x a day before meals - Novolog sliding scale as follows:  - 150-175: + 1 unit  - 176-200: + 2 units  - 201-225: + 3 units  - 226-250: + 4 units  - >250: + 5 units If you exercise within 2h after a meal, reduce the insulin with that meal by 25-50%.  Please return in 6 months with your sugar log.

## 2019-01-24 NOTE — Progress Notes (Signed)
Virtual Visit via Video Note  I connected with patient on 01/25/19 at  1:00 PM EDT by audio enabled telemedicine application and verified that I am speaking with the correct person using two identifiers.   THIS ENCOUNTER IS A VIRTUAL VISIT DUE TO COVID-19 - PATIENT WAS NOT SEEN IN THE OFFICE. PATIENT HAS CONSENTED TO VIRTUAL VISIT / TELEMEDICINE VISIT   Location of patient: home  Location of provider: office  I discussed the limitations of evaluation and management by telemedicine and the availability of in person appointments. The patient expressed understanding and agreed to proceed.   Subjective:   Ryan [name] is a 73 y.o. male who presents for Medicare Annual/Subsequent preventive examination.  Review of Systems: No ROS.  Medicare Wellness Virtual Visit.  Visual/audio telehealth visit, UTA vital signs.   See social history for additional risk factors.   Sleep patterns: no issues Home Safety/Smoke Alarms: Feels safe in home. Smoke alarms in place.  Lives in 1 story home. Son's family lives with him.  Eye: Dr.Groat Feb 2020 per pt.  Male:   CCS- Cologuard 09/27/18    PSA-  Lab Results  Component Value Date   PSA 0.10 02/04/2015   PSA 0.16 05/25/2011       Objective:    Vitals: BP 124/77 Comment: pt reported all vitals  Pulse 60   Temp (!) 97.5 F (36.4 C)   Wt 172 lb (78 kg)   SpO2 96%   BMI 25.40 kg/m   Body mass index is 25.4 kg/m.  Advanced Directives 01/25/2019 11/29/2017 11/22/2016 01/27/2016 01/27/2016  Does Patient Have a Medical Advance Directive? Yes No No No No  Type of Paramedic of [place];Living will - - - -  Does patient want to make changes to medical advance directive? No - Patient declined - Yes (MAU/Ambulatory/Procedural Areas - Information given) - -  Copy of [place] in Chart? Yes - validated most recent copy scanned in chart (See row information) - - - -  Would patient like information on creating a  medical advance directive? - No - Patient declined - No - patient declined information No - patient declined information    Tobacco Social History   Tobacco Use  Smoking Status Former Smoker  . Types: Cigarettes  . Quit date: 09/01/1965  . Years since quitting: 53.4  Smokeless Tobacco Never Used     Counseling given: Not Answered   Clinical Intake Pain : No/denies pain   Past Medical History:  Diagnosis Date  . Allergy   . Diabetes mellitus   . GERD (gastroesophageal reflux disease)   . History of chicken pox   . Hyperlipidemia   . Hypertension   . Myocardial infarction ([place]) 2017  . Type 2 diabetes mellitus with hyperglycemia, with long-term current use of insulin ([place]) 09/08/2015   Past Surgical History:  Procedure Laterality Date  . EYE SURGERY  1998   bilateral  . SHOULDER SURGERY  1990   right shoulder, torn rotator cuff.   Family History  Problem Relation Age of Onset  . Cancer Mother        cancer  . Heart disease Mother   . Hyperlipidemia Mother   . Hypertension Mother   . Hyperlipidemia Father   . Stroke Father   . Hypertension Father   . Diabetes Paternal Grandfather    Social History   Socioeconomic History  . Marital status: Married    Spouse name: Not on file  .  Number of children: 1  . Years of education: Not on file  . Highest education level: Not on file  Occupational History  . Not on file  Social Needs  . Financial resource strain: Not on file  . Food insecurity    Worry: Not on file    Inability: Not on file  . Transportation needs    Medical: Not on file    Non-medical: Not on file  Tobacco Use  . Smoking status: Former Smoker    Types: Cigarettes    Quit date: 09/01/1965    Years since quitting: 53.4  . Smokeless tobacco: Never Used  Substance and Sexual Activity  . Alcohol use: No  . Drug use: No  . Sexual activity: Never  Lifestyle  . Physical activity    Days per week: Not on file    Minutes per session: Not on file   . Stress: Not on file  Relationships  . Social Musician on phone: Not on file    Gets together: Not on file    Attends religious service: Not on file    Active member of club or organization: Not on file    Attends meetings of clubs or organizations: Not on file    Relationship status: Not on file  Other Topics Concern  . Not on file  Social History Narrative   Regular exercise:  5 x weekly   Caffeine Use: 1 cups coffee daily.   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   Married- One son and grandson- both live with them   Wife has ovarian cancer             Outpatient Encounter Medications as of 01/25/2019  Medication Sig  . ACCU-CHEK AVIVA PLUS test strip 1 each by Other route See admin instructions. Check blood sugars 6 times daily  . ACCU-CHEK SOFTCLIX LANCETS lancets 1 each by Other route See admin instructions. Check blood sugar 6 times daily  . apixaban (ELIQUIS) 5 MG TABS tablet Take 1 tablet (5 mg total) by mouth 2 (two) times daily.  Marland Kitchen aspirin 81 MG tablet Take 81 mg by mouth daily.    Marland Kitchen atorvastatin (LIPITOR) 40 MG tablet TAKE 1 TABLET BY MOUTH ONCE DAILY  . Calcium Carbonate-Vitamin D (CALTRATE 600+D) 600-400 MG-UNIT per tablet Take 1 tablet by mouth 2 (two) times daily. Reported on 01/27/2016  . insulin aspart (NOVOLOG) 100 UNIT/ML injection INJECT 10-12 UNITS INTO THE SKIN 3 TIMES DAILY WITH MEALS. UNLESS SUGAR BELOW 150  . insulin detemir (LEVEMIR) 100 UNIT/ML injection INJECT 0.30 ML (30 UNITS) INTO THE SKIN AT BEDTIME  . Insulin Syringe-Needle U-100 (BD INSULIN SYRINGE ULTRAFINE) 31G X 5/16" 0.5 ML MISC Use 4x a day  . lisinopril (ZESTRIL) 5 MG tablet TAKE 1 TABLET BY MOUTH DAILY.  . metoprolol succinate (TOPROL-XL) 25 MG 24 hr tablet TAKE 1 TABLET BY MOUTH ONCE DAILY   No facility-administered encounter medications on file as of 01/25/2019.     Activities of Daily Living No flowsheet data found.  Patient Care Team: Sandford Craze, NP as PCP -  General (Internal Medicine) Pricilla Riffle, MD as PCP - Cardiology (Cardiology) Carlus Pavlov, MD as Consulting Physician (Endocrinology) Pricilla Riffle, MD as Consulting Physician (Cardiology) Bary Richard, RN as [facility] Care Management   Assessment:   This is a routine wellness examination for [employer]. Physical assessment deferred to PCP.  Exercise Activities and Dietary recommendations   Diet (meal preparation, eat out,  water intake, caffeinated beverages, dairy products, fruits and vegetables): well balanced, on average, 3 meals per day   Goals    . Increase water intake    . Weight (lb) < 175 lb (79.4 kg)       Fall Risk Fall Risk  01/25/2019 11/29/2017 11/22/2016 02/04/2015 02/04/2015  Falls in the past year? 0 No No No Yes  Number falls in past yr: - - - - 1  Injury with Fall? - - - - No    Depression Screen PHQ 2/9 Scores 01/25/2019 11/29/2017 11/22/2016 02/04/2015  PHQ - 2 Score 0 0 0 0    Cognitive Function Ad8 score reviewed for issues:  Issues making decisions:no  Less interest in hobbies / activities:no  Repeats questions, stories (family complaining):no  Trouble using ordinary gadgets (microwave, computer, phone):no  Forgets the month or year: no  Mismanaging finances: no  Remembering appts:no  Daily problems with thinking and/or memory:no Ad8 score is=0    MMSE - Mini Mental State Exam 11/29/2017 11/22/2016  Orientation to time 5 5  Orientation to Place 5 5  Registration 3 3  Attention/ Calculation 5 5  Recall 3 2  Language- name 2 objects 2 2  Language- repeat 1 1  Language- follow 3 step command 3 3  Language- read & follow direction 1 1  Write a sentence 1 1  Copy design 1 0  Total score 30 28        Immunization History  Administered Date(s) Administered  . H1N1 11/19/2008  . Influenza Split 05/25/2011, 04/20/2012  . Influenza Whole 06/06/2009  . Influenza, High Dose Seasonal PF 04/20/2016, 05/12/2017, 05/26/2018   . Influenza,inj,Quad PF,6+ Mos 05/07/2013, 05/27/2014, 05/13/2015  . Pneumococcal Conjugate-13 10/19/2004, 05/11/2013  . Pneumococcal Polysaccharide-23 05/25/2011, 09/08/2018  . Td 08/09/2001, 08/25/2011  . Zoster 08/25/2011   Screening Tests Health Maintenance  Topic Date Due  . INFLUENZA VACCINE  03/10/2019  . HEMOGLOBIN A1C  07/14/2019  . FOOT EXAM  09/09/2019  . OPHTHALMOLOGY EXAM  09/21/2019  . TETANUS/TDAP  08/24/2021  . Fecal DNA (Cologuard)  09/27/2021  . Hepatitis C Screening  Completed  . PNA vac Low Risk Adult  Completed     Plan:   See you next year!  Continue to eat heart healthy diet (full of fruits, vegetables, whole grains, lean protein, water--limit salt, fat, and sugar intake) and increase physical activity as tolerated.  Continue doing brain stimulating activities (puzzles, reading, adult coloring books, staying active) to keep memory sharp.   Bring a copy of your living will and/or healthcare power of attorney to your next office visit.    I have personally reviewed and noted the following in the patient's chart:   . Medical and social history . Use of alcohol, tobacco or illicit drugs  . Current medications and supplements . Functional ability and status . Nutritional status . Physical activity . Advanced directives . List of other physicians . Hospitalizations, surgeries, and ER visits in previous 12 months . Vitals . Screenings to include cognitive, depression, and falls . Referrals and appointments  In addition, I have reviewed and discussed with patient certain preventive protocols, quality metrics, and best practice recommendations. A written personalized care plan for preventive services as well as general preventive health recommendations were provided to patient.     Avon GullyBritt, Amara Manalang Angel, [place]  01/25/2019

## 2019-01-25 ENCOUNTER — Ambulatory Visit (INDEPENDENT_AMBULATORY_CARE_PROVIDER_SITE_OTHER): Payer: HMO | Admitting: *Deleted

## 2019-01-25 ENCOUNTER — Encounter: Payer: Self-pay | Admitting: *Deleted

## 2019-01-25 ENCOUNTER — Other Ambulatory Visit: Payer: Self-pay

## 2019-01-25 VITALS — BP 124/77 | HR 60 | Temp 97.5°F | Wt 172.0 lb

## 2019-01-25 DIAGNOSIS — Z0000 Encounter for general adult medical examination without abnormal findings: Secondary | ICD-10-CM | POA: Diagnosis not present

## 2019-01-25 NOTE — Progress Notes (Signed)
Reviewed and agree.  Clarann Helvey S [name] NP 

## 2019-01-25 NOTE — Patient Instructions (Signed)
See you next year!  Continue to eat heart healthy diet (full of fruits, vegetables, whole grains, lean protein, water--limit salt, fat, and sugar intake) and increase physical activity as tolerated.  Continue doing brain stimulating activities (puzzles, reading, adult coloring books, staying active) to keep memory sharp.   Bring a copy of your living will and/or healthcare power of attorney to your next office visit.   [name] , Thank you for taking time to come for your Medicare Wellness Visit. I appreciate your ongoing commitment to your health goals. Please review the following plan we discussed and let me know if I can assist you in the future.   These are the goals we discussed: Goals    . Increase water intake    . Weight (lb) < 175 lb (79.4 kg)       This is a list of the screening recommended for you and due dates:  Health Maintenance  Topic Date Due  . Flu Shot  03/10/2019  . Hemoglobin A1C  07/14/2019  . Complete foot exam   09/09/2019  . Eye exam for diabetics  09/21/2019  . Tetanus Vaccine  08/24/2021  . Cologuard (Stool DNA test)  09/27/2021  .  Hepatitis C: One time screening is recommended by Center for Disease Control  (CDC) for  adults born from [birth year] through 1965.   Completed  . Pneumonia vaccines  Completed    Health Maintenance After Age 44 After age 14, you are at a higher risk for certain long-term diseases and infections as well as injuries from falls. Falls are a major cause of broken bones and head injuries in people who are older than age 27. Getting regular preventive care can help to keep you healthy and well. Preventive care includes getting regular testing and making lifestyle changes as recommended by your health care provider. Talk with your health care provider about:  Which screenings and tests you should have. A screening is a test that checks for a disease when you have no symptoms.  A diet and exercise plan that is right for you. What should  I know about screenings and tests to prevent falls? Screening and testing are the best ways to find a health problem early. Early diagnosis and treatment give you the best chance of managing medical conditions that are common after age 75. Certain conditions and lifestyle choices may make you more likely to have a fall. Your health care provider may recommend:  Regular vision checks. Poor vision and conditions such as cataracts can make you more likely to have a fall. If you wear glasses, make sure to get your prescription updated if your vision changes.  Medicine review. Work with your health care provider to regularly review all of the medicines you are taking, including over-the-counter medicines. Ask your health care provider about any side effects that may make you more likely to have a fall. Tell your health care provider if any medicines that you take make you feel dizzy or sleepy.  Osteoporosis screening. Osteoporosis is a condition that causes the bones to get weaker. This can make the bones weak and cause them to break more easily.  Blood pressure screening. Blood pressure changes and medicines to control blood pressure can make you feel dizzy.  Strength and balance checks. Your health care provider may recommend certain tests to check your strength and balance while standing, walking, or changing positions.  Foot health exam. Foot pain and numbness, as well as not wearing proper  footwear, can make you more likely to have a fall.  Depression screening. You may be more likely to have a fall if you have a fear of falling, feel emotionally low, or feel unable to do activities that you used to do.  Alcohol use screening. Using too much alcohol can affect your balance and may make you more likely to have a fall. What actions can I take to lower my risk of falls? General instructions  Talk with your health care provider about your risks for falling. Tell your health care provider if: ? You  fall. Be sure to tell your health care provider about all falls, even ones that seem minor. ? You feel dizzy, sleepy, or off-balance.  Take over-the-counter and prescription medicines only as told by your health care provider. These include any supplements.  Eat a healthy diet and maintain a healthy weight. A healthy diet includes low-fat dairy products, low-fat (lean) meats, and fiber from whole grains, beans, and lots of fruits and vegetables. Home safety  Remove any tripping hazards, such as rugs, cords, and clutter.  Install safety equipment such as grab bars in bathrooms and safety rails on stairs.  Keep rooms and walkways well-lit. Activity   Follow a regular exercise program to stay fit. This will help you maintain your balance. Ask your health care provider what types of exercise are appropriate for you.  If you need a cane or walker, use it as recommended by your health care provider.  Wear supportive shoes that have nonskid soles. Lifestyle  Do not drink alcohol if your health care provider tells you not to drink.  If you drink alcohol, limit how much you have: ? 0-1 drink a day for women. ? 0-2 drinks a day for men.  Be aware of how much alcohol is in your drink. In the [place] one drink equals one typical bottle of beer (12 oz), one-half glass of wine (5 oz), or one shot of hard liquor (1 oz).  Do not use any products that contain nicotine or tobacco, such as cigarettes and e-cigarettes. If you need help quitting, ask your health care provider. Summary  Having a healthy lifestyle and getting preventive care can help to protect your health and wellness after age 61.  Screening and testing are the best way to find a health problem early and help you avoid having a fall. Early diagnosis and treatment give you the best chance for managing medical conditions that are more common for people who are older than age 71.  Falls are a major cause of broken bones and head  injuries in people who are older than age 60. Take precautions to prevent a fall at home.  Work with your health care provider to learn what changes you can make to improve your health and wellness and to prevent falls. This information is not intended to replace advice given to you by your health care provider. Make sure you discuss any questions you have with your health care provider. Document Released: 06/08/2017 Document Revised: 06/08/2017 Document Reviewed: 06/08/2017 Elsevier Interactive Patient Education  2019 [employer].

## 2019-02-09 MED FILL — LISINOPRIL 5 MG TABLET: 5 | 30 days supply | Qty: 30 | Fill #1

## 2019-02-09 MED FILL — ELIQUIS 5 MG TABLET: 5 | 30 days supply | Qty: 60 | Fill #3

## 2019-02-26 ENCOUNTER — Other Ambulatory Visit: Payer: Self-pay | Admitting: Family

## 2019-02-26 ENCOUNTER — Other Ambulatory Visit: Payer: Self-pay | Admitting: Internal Medicine

## 2019-02-26 MED FILL — LEVEMIR 100 UNITS/ML VIAL: 100 | 67 days supply | Qty: 30 | Fill #0

## 2019-02-26 MED FILL — NovoLOG 100 UNIT/ML SOLN: 100 | 67 days supply | Qty: 30 | Fill #0

## 2019-02-26 MED FILL — METOPROLOL SUCCINATE ER 25: 25 | 30 days supply | Qty: 30 | Fill #0

## 2019-02-26 MED FILL — ATORVASTATIN 40 MG TABLET: 40 | 30 days supply | Qty: 30 | Fill #0

## 2019-03-01 ENCOUNTER — Other Ambulatory Visit: Payer: Self-pay | Admitting: *Deleted

## 2019-03-01 NOTE — Patient Outreach (Signed)
  [facility]) Care Management Chronic Special Needs Program    03/01/2019  Name: Ryan [name], DOB: Jul 06, 1946  MRN: [id number]   Ryan. Ryan [name] is enrolled in a chronic special needs plan for Diabetes. The person answering the phone at Ryan [name] home number stated he is not there. Provided this RNCM's phone number with request for Ryan [name] to return call in order to compete the initial intake assessment and address the referral received from the client's primary provider office.  Plan: If no return call from client, will make another outreach next week.   Kelli Churn RN, CCM, [facility] Care Management [phone]

## 2019-03-06 ENCOUNTER — Other Ambulatory Visit: Payer: Self-pay | Admitting: *Deleted

## 2019-03-06 ENCOUNTER — Encounter: Payer: Self-pay | Admitting: *Deleted

## 2019-03-06 ENCOUNTER — Ambulatory Visit: Payer: Self-pay | Admitting: *Deleted

## 2019-03-06 DIAGNOSIS — E7800 Pure hypercholesterolemia, unspecified: Secondary | ICD-10-CM

## 2019-03-06 DIAGNOSIS — I4892 Unspecified atrial flutter: Secondary | ICD-10-CM

## 2019-03-06 DIAGNOSIS — E1165 Type 2 diabetes mellitus with hyperglycemia: Secondary | ICD-10-CM

## 2019-03-06 DIAGNOSIS — E1159 Type 2 diabetes mellitus with other circulatory complications: Secondary | ICD-10-CM

## 2019-03-06 NOTE — Patient Outreach (Addendum)
[facility]) Care Management Chronic Special Needs Program  03/06/2019  Name: Ryan [name] DOB: Dec 03, 1945  MRN: [id number]  Ryan [name] is enrolled in a chronic special needs plan for Diabetes. Chronic Care Management Coordinator telephoned client to review health risk assessment and to develop individualized care plan.  Introduced the chronic care management program, importance of client participation, and taking their care plan to all provider appointments and inpatient facilities.  Reviewed the transition of care process and possible referral to community care management.  Subjective: Reached Ryan [name] via home number. 2 HIPAA identifiers verified. Explained purpose of call and completed initial assessment and to address the referral received from his primary care provider on 02/28/19 regarding coverage gap assistance.  Ryan [name] states he thinks he may have had the Covid 19 infection as he has been very fatigued, his toes were red and hurt. He denies fever, cough or dyspnea. He says his diabetes is in good control and that his Hgb A1C is always under 7.0%. He reports only occasional (once/ week)  hypoglycemia and he is always able to self treat. Says he tried the [employer] flash glucose monitoring system but he had an extreme skin reaction to the sensor and "felt sick". He says he does not want to try the system again. Says he is OK with pricking his fingers 4 times daily. He says he is in the coverage gap and would welcome a pharmacy referral for copay assistance.  He says he is adherent with his medications and keeps a chart of his home blood pressure, CBGs , weight and O2 saturations. He says his sister died from complications of high blood pressure so he is very cognizant of his BP targets and readings.  He says he walks daily and has a home treadmill although he prefers to walk outside. He says he used to walk 30-40 miles per week but has not been able to do this lately  due to the fatigue he has been experiencing. He says he is aware of heart attack symptoms and follows his meal plan as prescribed and states " I stay on top of my health issues".  He says he lives with his son and daughter in [place] . He says his wife died in 12/21/14 from ovarian cancer and he had to make the decision to terminate her life support so he has an advance directive and health care power of attorney and he says his son is aware of his end of life wishes. He says he does not need additional information or need to make changes to his advance directive.   Goals Addressed            This Visit's Progress     Patient Stated   . "Maintain my  A1C under 7.0%" (pt-stated)       Reviewed targets for Hgb A1C and pre and post meal blood sugars Assessed medication taking behavior Reviewed sliding scale per Dr. Cruzita Lederer      Other   . Client understands the importance of follow-up with providers by attending scheduled visits   On track   . HEMOGLOBIN A1C < 7.0       Reviewed target Hgb A1C    . Maintain timely refills of diabetic medication as prescribed within the year .   On track   . Obtain annual  Lipid Profile, LDL-C   On track   . Obtain Annual Eye (retinal)  Exam    On track   .  Obtain Annual Foot Exam   On track   . Obtain annual screen for micro albuminuria (urine) , nephropathy (kidney problems)   On track   . Obtain Hemoglobin A1C at least 2 times per year   On track   . Visit Primary Care Provider or Endocrinologist at least 2 times per year    On track    Assessment: Client is meeting diabetes self-management goal of hemoglobin A1C of <7% with ongoing good control for last several years and most recent reading of 6.8% on 01/12/19 with occasional reports of mild hypoglycemia . Client has good knowledge of: COVID-19 cause, symptoms, precautions (social distancing, stay at home order, hand washing, wear face covering when unable to maintain or ensure 6 foot social distancing), and  symptoms requiring provider notification.  Plan:   Send successful outreach letter with a copy of their individualized care plan   Send individual care plan to provider  Chronic care management coordination will outreach in:  5 months  Will refer client to:  Pharmacy for coverage gap assistance   Cranford Mon RN, CCM, CDE Chronic Care Management Coordinator [facility] Care Management [phone]

## 2019-03-06 NOTE — Addendum Note (Signed)
Addended by: Barrington Ellison on: 03/06/2019 12:45 PM   Modules accepted: Orders

## 2019-03-07 ENCOUNTER — Other Ambulatory Visit: Payer: Self-pay

## 2019-03-07 ENCOUNTER — Ambulatory Visit (INDEPENDENT_AMBULATORY_CARE_PROVIDER_SITE_OTHER): Payer: HMO | Admitting: Family

## 2019-03-07 ENCOUNTER — Telehealth: Payer: Self-pay | Admitting: Pharmacist

## 2019-03-07 DIAGNOSIS — R609 Edema, unspecified: Secondary | ICD-10-CM | POA: Diagnosis not present

## 2019-03-07 NOTE — Patient Outreach (Addendum)
[facility]) Care Management  [place]   03/08/2019  ELGIE MAZIARZ [birth year] [id number]  Reason for referral: Medication Assistance, Medication Review  Referral source: [employer] Care Manager with [facility] Current insurance: [employer] C-SNP  PMHx includes but not limited to:   Allergic rhinitis, Atrial Flutter, GERD, Hypertension, hyperlipidemia, osteopenia, type 2 diabetes.  Outreach:  Successful telephone call with patient.  HIPAA identifiers verified.   Subjective:  Patient is a 73 year old male with the above mentioned medical conditions. He reported he is currently in the donut hole and expressed concern about having to pay >$500 for his insulin (Levemir and Novolog).  Does the patient ever forget to take medication?  no Does the patient have problems obtaining medications due to transportation?   no Does the patient have problems obtaining medications due to cost?  yes  Does the patient feel that medications prescribed are effective?  yes Does the patient ever experience any side effects to the medications prescribed?  no  Does the patient measure his/her own blood glucose at home?  Yes Does the patient measure his/her own blood pressure at home? Yes   Objective: The 10-year ASCVD risk score [name], et al., 2013) is: 35.6%   Values used to calculate the score:     Age: 73 years     Sex: Male     Is Non-Hispanic African American: No     Diabetic: Yes     Tobacco smoker: No     Systolic Blood Pressure: 859 mmHg     Is BP treated: Yes     HDL Cholesterol: 42.1 mg/dL     Total Cholesterol: 133 mg/dL  Lab Results  Component Value Date   CREATININE 1.08 09/08/2018   CREATININE 0.95 03/08/2018   CREATININE 1.49 08/10/2017    Lab Results  Component Value Date   HGBA1C 6.8 (A) 01/12/2019    Lipid Panel     Component Value Date/Time   CHOL 133 01/11/2018 1103   TRIG 60.0 01/11/2018 1103   HDL 42.10  01/11/2018 1103   CHOLHDL 3 01/11/2018 1103   VLDL 12.0 01/11/2018 1103   LDLCALC 79 01/11/2018 1103    BP Readings from Last 3 Encounters:  01/25/19 124/77  01/12/19 140/80  11/02/18 128/74    Allergies  Allergen Reactions  . Other Other (See Comments)    Client states he became very sick and had significant skin inflammation and irritation with the [facility] sensor    Medications Reviewed Today    Reviewed by Elayne Guerin, [facility] (Pharmacist) on 03/08/19 at 1522  Med List Status: <None>  Medication Order Taking? Sig Documenting Provider Last Dose Status Informant  apixaban (ELIQUIS) 5 MG TABS tablet [id number] Yes Take 1 tablet (5 mg total) by mouth 2 (two) times daily. Fay Records, MD Taking Active   aspirin 81 MG tablet [id number] Yes Take 81 mg by mouth daily.   [provider] Taking Active Self  atorvastatin (LIPITOR) 40 MG tablet [id number] Yes TAKE 1 TABLET BY MOUTH ONCE DAILY [name], NP Taking Active   Calcium Carbonate-Vitamin D (CALTRATE 600+D) 600-400 MG-UNIT per tablet [id number] Yes Take 1 tablet by mouth 2 (two) times daily. Reported on 01/27/2016 [provider] Taking Active Self  insulin aspart (NOVOLOG) 100 UNIT/ML injection [id number] Yes INJECT 7-10 UNITS INTO THE SKIN 3 TIMES A DAY WITH MEALS [name], MD Taking Active  Med Note Broadus John, JANET S   Tue Mar 06, 2019 11:31 AM) Novolog mealtime 7-9 units 3x a day before meals - Novolog sliding scale as follows:  - 150-175: + 1 unit  - 176-200: + 2 units  - 201-225: + 3 units  - 226-250: + 4 units  - >250: + 5 units If you exercise within 2h after a meal, reduce the insulin with that meal by 25-50%.  insulin detemir (LEVEMIR) 100 UNIT/ML injection [id number] Yes INJECT 0.30 ML (30 UNITS) INTO THE SKIN AT BEDTIME [name], MD Taking Active   Insulin Syringe-Needle U-100 (BD INSULIN SYRINGE ULTRAFINE) 31G X 5/16" 0.5 ML MISC [id number] Yes Use 4x a day  [name], MD Taking Active   lisinopril (ZESTRIL) 5 MG tablet [id number] Yes TAKE 1 TABLET BY MOUTH DAILY. [name], NP Taking Active   metoprolol succinate (TOPROL-XL) 25 MG 24 hr tablet [id number] Yes TAKE 1 TABLET BY MOUTH ONCE DAILY [name], NP Taking Active           Assessment: Drugs sorted by system:  Neurologic/Psychologic:  Hematologic:   Cardiovascular:  Eliquis, Aspirin, Atorvastatin, Lisinopril, Metoprolol,   Endocrine: Novolog, Levemir,   Vitamins/Minerals/Supplements: Calcium Carbonate,   Medication Review Findings:  . HgA1c- 6.8 . On statin -Atorvastatin 25m last filled 11/27/18 & 02/26/19   Medication Adherence Findings: Adherence Review  _0  Excellent (no doses missed/week)     _1  Good (no more than 1 dose missed/week)     _2  Partial (2-3 doses missed/week) _3  Poor (>3 doses missed/week)  Patient with excellent understanding of regimen and good understanding of indications.    Medication Assistance Findings:  Medication assistance needs identified: Novolog, Levemir  Extra Help:  Not eligible for Extra Help Low Income Subsidy based on reported income and assets  Patient Assistance Programs: Levemir and Novolog  made by NHilorequirement met: Yes o Out-of-pocket prescription expenditure met:   Yes - Patient has met application requirements to apply for this program.    Additional medication assistance options reviewed with patient as warranted:  CVisual merchandiser Plan: . I will route patient assistance letter to TLambertontechnician who will coordinate patient assistance program application process for medications listed above.  [facility] will assist with obtaining all required documents from both patient and provider(s) and submit application(s) once completed.  . Will route note to Dr. GRenne Crigler  . Will follow-up in 6-8 weeks.   KElayne Guerin PharmD, [place] Clinical Pharmacist ([phone]

## 2019-03-07 NOTE — Progress Notes (Signed)
Virtual Visit via telephone Note  I connected with [name] on 03/07/19 at 11:20 AM EDT by a video enabled telemedicine application and verified that I am speaking with the correct person using two identifiers.  Location: Patient: home Provider: work   I discussed the limitations of evaluation and management by telemedicine and the availability of in person appointments. The patient expressed understanding and agreed to proceed.  History of Present Illness:  Patient is a 73 yr old male who presents today to discuss foot swelling.  He reports that he first noticed the swelling around 7/1 then "went down." Then last month it went down.  Today he reports mild bilateral LE edema. Reports right foot is sore "along the side." Reports that it hurt to walk on both feet at one time, today only right foot is sore. Denies fever.  He denies sob or chest pain. Reports weight has been stable. Reports stable blood sugars <100 fasting.   bp 128/74, oxygen 97, HR 59, Temp 96.1   Observations/Objective:   Gen: Awake, alert, no acute distress Resp: Breathing is even and non-labored Psych: calm/pleasant demeanor Neuro: Alert and Oriented x 3, + facial symmetry, speech is clear.   Assessment and Plan:  LE edema- bilateral, some joint pain. Does not sound to be overtly volume overloaded. Dependent edema, gout flare are both considerations. Clinically doubt DVT.  I advised pt that I would really need to see him in the office to adequately evaluate this and offered him a face to face visit this week. He states that he cannot get here this week due to transportation issues.  He is scheduled to follow up in 1 month already. Advised pt that should he have increased pain/swelling, redness to schedule a sooner face to face visit and he verbalizes understanding.   11 minutes spent on today's phone visit.   Follow Up Instructions:    I discussed the assessment and treatment plan with the patient. The patient  was provided an opportunity to ask questions and all were answered. The patient agreed with the plan and demonstrated an understanding of the instructions.   The patient was advised to call back or seek an in-person evaluation if the symptoms worsen or if the condition fails to improve as anticipated.  Nance Pear, NP

## 2019-03-08 ENCOUNTER — Other Ambulatory Visit: Payer: Self-pay | Admitting: Pharmacy Technician

## 2019-03-08 NOTE — Patient Outreach (Signed)
[facility]) Care Management  03/08/2019  Ryan [name] 26-May-1946 [id number]                           Medication Assistance Referral  Referral From: [place]  Medication/Company: Software engineer / [employer] Patient application portion:  Education officer, museum portion: Faxed  to Dr [name]   Follow up:  Will follow up with patient in 5-10 business days to confirm application(s) have been received.  Ryan [name], [place] Management [phone]

## 2019-03-09 ENCOUNTER — Ambulatory Visit: Payer: PPO | Admitting: Family

## 2019-03-14 MED FILL — ELIQUIS 5 MG TABLET: 5 | 30 days supply | Qty: 60 | Fill #4

## 2019-03-14 MED FILL — LISINOPRIL 5 MG TABLET: 5 | 30 days supply | Qty: 30 | Fill #2

## 2019-03-21 ENCOUNTER — Other Ambulatory Visit: Payer: Self-pay | Admitting: Pharmacy Technician
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
## 2019-04-02 ENCOUNTER — Other Ambulatory Visit: Payer: Self-pay | Admitting: Family

## 2019-04-02 ENCOUNTER — Other Ambulatory Visit: Payer: Self-pay | Admitting: Internal Medicine

## 2019-04-02 MED FILL — TRUEPLUS SYR 0.5ML 31GX5/16: 31G X 5/16" | 75 days supply | Qty: 300 | Fill #0

## 2019-04-02 MED FILL — METOPROLOL SUCCINATE ER 25: 25 | 30 days supply | Qty: 30 | Fill #0

## 2019-04-02 MED FILL — ATORVASTATIN 40 MG TABLET: 40 | 30 days supply | Qty: 30 | Fill #0

## 2019-04-03 ENCOUNTER — Other Ambulatory Visit: Payer: Self-pay | Admitting: Pharmacy Technician

## 2019-04-03 NOTE — Patient Outreach (Signed)
[facility] [facility]) Care Management  04/03/2019  RYLEE NUZUM March 17, 1946 [id number]    Received all necessary documents and signatures from both patient and provider for [employer] application for Novolog and Levemir vials.  Submitted completed application via fax to [employer].  Will followup with Novo Nordisk in 2-5 business days to inquire if a determination has been made.  Wateen Varon P. Lenore Moyano, [place] Management [phone]

## 2019-04-06 ENCOUNTER — Ambulatory Visit: Payer: HMO | Admitting: Family

## 2019-04-06 ENCOUNTER — Other Ambulatory Visit: Payer: Self-pay

## 2019-04-06 ENCOUNTER — Encounter: Payer: Self-pay | Admitting: Family

## 2019-04-06 ENCOUNTER — Other Ambulatory Visit: Payer: Self-pay | Admitting: Pharmacy Technician

## 2019-04-06 VITALS — BP 129/71 | HR 65 | Temp 96.9°F | Resp 16 | Ht 69.3 in | Wt 168.8 lb

## 2019-04-06 DIAGNOSIS — M79672 Pain in left foot: Secondary | ICD-10-CM

## 2019-04-06 DIAGNOSIS — M7662 Achilles tendinitis, left leg: Secondary | ICD-10-CM

## 2019-04-06 DIAGNOSIS — I10 Essential (primary) hypertension: Secondary | ICD-10-CM

## 2019-04-06 DIAGNOSIS — Z20828 Contact with and (suspected) exposure to other viral communicable diseases: Secondary | ICD-10-CM

## 2019-04-06 DIAGNOSIS — Z20822 Contact with and (suspected) exposure to covid-19: Secondary | ICD-10-CM

## 2019-04-06 DIAGNOSIS — E785 Hyperlipidemia, unspecified: Secondary | ICD-10-CM

## 2019-04-06 LAB — COMPREHENSIVE METABOLIC PANEL
ALT: 11 U/L (ref 0–53)
AST: 13 U/L (ref 0–37)
Albumin: 3.9 g/dL (ref 3.5–5.2)
Alkaline Phosphatase: 74 U/L (ref 39–117)
BUN: 17 mg/dL (ref 6–23)
CO2: 28 mEq/L (ref 19–32)
Calcium: 9.3 mg/dL (ref 8.4–10.5)
Chloride: 101 mEq/L (ref 96–112)
Creatinine, Ser: 0.98 mg/dL (ref 0.40–1.50)
GFR: 74.99 mL/min (ref >60.00)
Glucose, Bld: 154 mg/dL — ABNORMAL HIGH (ref 70–99)
Potassium: 4.3 mEq/L (ref 3.5–5.1)
Sodium: 137 mEq/L (ref 135–145)
Total Bilirubin: 0.8 mg/dL (ref 0.2–1.2)
Total Protein: 7 g/dL (ref 6.0–8.3)

## 2019-04-06 LAB — LIPID PANEL
Cholesterol: 128 mg/dL (ref 0–200)
HDL: 39.2 mg/dL (ref >39.00)
LDL Cholesterol: 74 mg/dL (ref 0–99)
NonHDL: 89.27
Total CHOL/HDL Ratio: 3
Triglycerides: 78 mg/dL (ref 0.0–149.0)
VLDL: 15.6 mg/dL (ref 0.0–40.0)

## 2019-04-06 LAB — URIC ACID: Uric Acid, Serum: 4.4 mg/dL (ref 4.0–7.8)

## 2019-04-06 LAB — SARS-COV-2 IGG: SARS-COV-2 IgG: 0.02

## 2019-04-06 NOTE — Patient Outreach (Signed)
[place] [facility]) Care Management  04/06/2019  AARON BOSTWICK [birth year] [id number]    Care coordiantion call placed to [place] in regards to patient's application for Levemir and Novolog vials.  Spoke to [employer] who informed that the proof of income that patient is submitted is invalid. While it is some type of tax form it is not the 1040. Patient will need to submit a valid proof of income.  Donte also informed based on the household number and income written on the application, the patient would need to apply for LIS. If denied then the letter would need to be submitted to [employer] for approval. They do not accept partial subsidy.  Will outreach [place] for assistance with helping patient apply for LIS.  Jamis Kryder P. Jordane Hisle, [employer] Management [phone]

## 2019-04-06 NOTE — Patient Instructions (Signed)
Please complete lab work prior to leaving. You should be contacted about your referral to Sports medicine for left achilles pain.

## 2019-04-09 ENCOUNTER — Encounter: Payer: Self-pay | Admitting: Family

## 2019-04-09 NOTE — Progress Notes (Signed)
[id number]  HPI   Patient is a 73 yr old male who presents today for follow up.   HTN- maintained on toprol xl 25mg  and lisinopril 5mg .     BP Readings from Last 3 Encounters:  04/06/19 129/71  01/25/19 124/77  01/12/19 140/80  LE edema-   DM2- continues novolog and levemir.  He follows with endocrinology.   Recent Labs       Lab Results  Component Value Date   HGBA1C 6.8 (A) 01/12/2019   HGBA1C 6.2 (A) 07/13/2018   HGBA1C 6.8 (A) 01/11/2018   Recent Labs       Lab Results  Component Value Date   MICROALBUR <0.7 01/11/2018   LDLCALC 79 01/11/2018   CREATININE 1.08 09/08/2018  Had myalgias, anorexia and low grad temp back in end of June/early July.   Foot swelling- reports that his swelling has improved. Still having some left achilles tendon tenderness.   Recent Labs       Lab Results  Component Value Date   CHOL 133 01/11/2018   HDL 42.10 01/11/2018   LDLCALC 79 01/11/2018   TRIG 60.0 01/11/2018   CHOLHDL 3 01/11/2018  Review of Systems  See HPI      Past Medical History:  Diagnosis Date  . Allergy   . Diabetes mellitus   . GERD (gastroesophageal reflux disease)   . History of chicken pox   . Hyperlipidemia   . Hypertension   . Myocardial infarction (HCC) 2017  . Type 2 diabetes mellitus with hyperglycemia, with long-term current use of insulin (HCC) 09/08/2015   Social History        Socioeconomic History  . Marital status: Widowed    Spouse name: Not on file  . Number of children: 1  . Years of education: Not on file  . Highest education level: Not on file  Occupational History  . Not on file  Social Needs  . Financial resource strain: Not hard at all  . Food insecurity    Worry: Never true    Inability: Never true  . Transportation needs    Medical: No    Non-medical: No  Tobacco Use  . Smoking status: Former Smoker    Types: Cigarettes    Quit date: 09/01/1965    Years since quitting: 53.6  . Smokeless tobacco: Never Used  Substance  and Sexual Activity  . Alcohol use: No  . Drug use: No  . Sexual activity: Never  Lifestyle  . Physical activity    Days per week: 7 days    Minutes per session: 30 min  . Stress: Only a little  Relationships  . Social connections    Talks on phone: More than three times a week    Gets together: More than three times a week    Attends religious service: Never    Active member of club or organization: Not on file    Attends meetings of clubs or organizations: Not on file    Relationship status: Widowed  . Intimate partner violence    Fear of current or ex partner: No    Emotionally abused: No    Physically abused: No    Forced sexual activity: No  Other Topics Concern  . Not on file  Social History Narrative   Regular exercise: 5 x weekly   Caffeine Use: 1 cups coffee daily.   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   Married- One son and grandson- both live with them   Wife died  from ovarian cancer in 2016              Past Surgical History:  Procedure Laterality Date  . EYE SURGERY  1998   bilateral  . SHOULDER SURGERY  1990   right shoulder, torn rotator cuff.        Family History  Problem Relation Age of Onset  . Cancer Mother    cancer  . Heart disease Mother   . Hyperlipidemia Mother   . Hypertension Mother   . Hyperlipidemia Father   . Stroke Father   . Hypertension Father   . Diabetes Paternal Grandfather         Allergies  Allergen Reactions  . Other Other (See Comments)    Client states he became very sick and had significant skin inflammation and irritation with the Freestyle Libre sensor         Current Outpatient Medications on File Prior to Visit  Medication Sig Dispense Refill  . apixaban (ELIQUIS) 5 MG TABS tablet Take 1 tablet (5 mg total) by mouth 2 (two) times daily. 60 tablet 6  . aspirin 81 MG tablet Take 81 mg by mouth daily.     Marland Kitchen atorvastatin (LIPITOR) 40 MG tablet TAKE 1 TABLET BY MOUTH ONCE DAILY **NEED FOLLOW UP FOR FURTHER REFILLS**  30 tablet 2  . Calcium Carbonate-Vitamin D (CALTRATE 600+D) 600-400 MG-UNIT per tablet Take 1 tablet by mouth 2 (two) times daily. Reported on 01/27/2016    . insulin aspart (NOVOLOG) 100 UNIT/ML injection INJECT 7-10 UNITS INTO THE SKIN 3 TIMES A DAY WITH MEALS 30 mL 5  . insulin detemir (LEVEMIR) 100 UNIT/ML injection INJECT 0.30 ML (30 UNITS) INTO THE SKIN AT BEDTIME 30 mL 5  . lisinopril (ZESTRIL) 5 MG tablet TAKE 1 TABLET BY MOUTH DAILY. 30 tablet 3  . metoprolol succinate (TOPROL-XL) 25 MG 24 hr tablet TAKE 1 TABLET BY MOUTH ONCE DAILY **NEEDS FOLLOW UP FOR FURTHER REFILLS** 30 tablet 2  . TRUEPLUS INSULIN SYRINGE 31G X 5/16" 0.5 ML MISC USE TO INJECT INSULIN 4 TIMES A DAY 300 each 3   No current facility-administered medications on file prior to visit.   BP 129/71 (BP Location: Right Arm, Patient Position: Sitting, Cuff Size: Small)  Pulse 65  Temp (!) 96.9 F (36.1 C) (Temporal)  Resp 16  Ht 5' 9.3" (1.76 m)  Wt 168 lb 12.8 oz (76.6 kg)  SpO2 99%  BMI 24.71 kg/m    Objective:   Gen: awake, alert, NAD. CV: S1/S2, RRR Resp: Breath sounds CTA bilaterally Psych: calm, pleasant affect Skin: no rash noted Ext: no peripheral edema Musculoskeletal: + tenderness to palpation overlying left achilles tendon.   Assessment & Plan:   HTN-  bp stable on current regimen, continue same  DM2- clinically stable. Continue current doses of novolog and levemir- management per endocrinology. Lab Results  Component Value Date   HGBA1C 6.8 (A) 01/12/2019   Achilles tendonitis- will refer to sports medicine for further evaluation.   He is requesting covid-19 antibody testing.  Test has been ordered.

## 2019-04-12 ENCOUNTER — Ambulatory Visit: Payer: HMO | Admitting: Family Medicine

## 2019-04-12 ENCOUNTER — Other Ambulatory Visit: Payer: Self-pay

## 2019-04-12 ENCOUNTER — Ambulatory Visit: Payer: Self-pay

## 2019-04-12 ENCOUNTER — Encounter: Payer: Self-pay | Admitting: Family Medicine

## 2019-04-12 VITALS — BP 143/83 | Ht 70.0 in | Wt 166.0 lb

## 2019-04-12 DIAGNOSIS — M79672 Pain in left foot: Secondary | ICD-10-CM

## 2019-04-12 MED ORDER — COLCHICINE 0.6 MG PO TABS: 0.6000 mg | ORAL_TABLET | Freq: Two times a day (BID) | ORAL | 2 refills | Status: DC

## 2019-04-12 MED FILL — ELIQUIS 5 MG TABLET: 5 | 30 days supply | Qty: 60 | Fill #5

## 2019-04-12 MED FILL — COLCHICINE 0.6 MG TABS: 0.6 | 30 days supply | Qty: 60 | Fill #0

## 2019-04-12 MED FILL — LISINOPRIL 5 MG TABLET: 5 | 30 days supply | Qty: 30 | Fill #3

## 2019-04-12 NOTE — Progress Notes (Signed)
Ryan [name] - 73 y.o. male MRN [id number]  Date of birth: [birth year]  SUBJECTIVE:  Including CC & ROS.  Chief Complaint  Patient presents with  . Foot Pain    left achilles    Ryan [name] is a 73 y.o. male that is presenting with left foot and ankle pain.  The pain is acute in nature.  He denies any specific inciting event.  He reports the pain occurring over the medial midfoot and traveling to around his ankle as well as the posterior aspect of the ankle and on the Achilles.  This pain is intermittent in nature.  Does seem to be worse with movement.  It is mild to moderate in nature.  Seems to be going away recently.  He walks fairly often and he does have diabetes.  He was walking roughly 30 miles a week.  Has not walked recently due to the pain.  Does have some swelling and redness in different parts of the foot.   Independent review of his lab work from 8/28 shows a hemoglobin A1c of 6.8 uric acid was also 4.4.   Review of Systems  Constitutional: Negative for fever.  HENT: Negative for congestion.   Respiratory: Negative for cough.   Cardiovascular: Negative for chest pain.  Gastrointestinal: Negative for abdominal pain.  Musculoskeletal: Positive for gait problem.  Skin: Negative for color change.  Neurological: Negative for weakness.  Hematological: Negative for adenopathy.    HISTORY: Past Medical, Surgical, Social, and Family History Reviewed & Updated per EMR.   Pertinent Historical Findings include:  Past Medical History:  Diagnosis Date  . Allergy   . Diabetes mellitus   . GERD (gastroesophageal reflux disease)   . History of chicken pox   . Hyperlipidemia   . Hypertension   . Myocardial infarction (HCC) 2017  . Type 2 diabetes mellitus with hyperglycemia, with long-term current use of insulin (HCC) 09/08/2015    Past Surgical History:  Procedure Laterality Date  . EYE SURGERY  1998   bilateral  . SHOULDER SURGERY  1990   right shoulder, torn rotator  cuff.    Allergies  Allergen Reactions  . Other Other (See Comments)    Client states he became very sick and had significant skin inflammation and irritation with the Freestyle Libre sensor    Family History  Problem Relation Age of Onset  . Cancer Mother        cancer  . Heart disease Mother   . Hyperlipidemia Mother   . Hypertension Mother   . Hyperlipidemia Father   . Stroke Father   . Hypertension Father   . Diabetes Paternal Grandfather      Social History   Socioeconomic History  . Marital status: Widowed    Spouse name: Not on file  . Number of children: 1  . Years of education: Not on file  . Highest education level: Not on file  Occupational History  . Not on file  Social Needs  . Financial resource strain: Not hard at all  . Food insecurity    Worry: Never true    Inability: Never true  . Transportation needs    Medical: No    Non-medical: No  Tobacco Use  . Smoking status: Former Smoker    Types: Cigarettes    Quit date: 09/01/1965    Years since quitting: 53.6  . Smokeless tobacco: Never Used  Substance and Sexual Activity  . Alcohol use: No  . Drug use: No  .  Sexual activity: Never  Lifestyle  . Physical activity    Days per week: 7 days    Minutes per session: 30 min  . Stress: Only a little  Relationships  . Social connections    Talks on phone: More than three times a week    Gets together: More than three times a week    Attends religious service: Never    Active member of club or organization: Not on file    Attends meetings of clubs or organizations: Not on file    Relationship status: Widowed  . Intimate partner violence    Fear of current or ex partner: No    Emotionally abused: No    Physically abused: No    Forced sexual activity: No  Other Topics Concern  . Not on file  Social History Narrative   Regular exercise:  5 x weekly   Caffeine Use: 1 cups coffee daily.   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   Married- One son and  grandson- both live with them   Wife died from ovarian cancer in 2016           PHYSICAL EXAM:  VS: BP (!) 143/83   Ht 5\' 10"  (1.778 m)   Wt 166 lb (75.3 kg)   BMI 23.82 kg/m  Physical Exam Gen: NAD, alert, cooperative with exam, well-appearing ENT: normal lips, normal nasal mucosa,  Eye: normal EOM, normal conjunctiva and lids CV:  no edema, +2 pedal pulses   Resp: no accessory muscle use, non-labored,  Skin: no rashes, no areas of induration  Neuro: normal tone, normal sensation to touch Psych:  normal insight, alert and oriented MSK:  Left foot: Obvious swelling over the midfoot dorsally as well as medially. No Haglund's deformity. No significant tenderness palpation over the Achilles or at the insertion. Some tenderness at the navicular with the insertion of the posterior tibialis. Some ecchymosis changes over the medial midfoot. Significant hallux valgus. Has maintained the longitudinal arch. Normal strength resistance. Neurovascular intact  Limited ultrasound: Left foot and ankle:  Achilles tendon appears to be normal thickness.  There is a hyper echogenicity at the insertion next could suggest a calcific change but no significant Doppler uptake in this area.  There is a small retrocalcaneal bursitis. There is effusion in the soft tissue medially to the posterior tibialis.  There is no effusion in the posterior tibialis tract. There is different areas of effusion deep to the insertion of the posterior tibialis at the navicular.  There is increased Doppler activity in this area.  There appears to be hyperechoic changes to suggest crystals and gouty change.  There is no specific area of tenderness when scanning to suggest a stress fracture.  There is calcific changes of the posterior tibialis at the insertion into the navicular. There appears to be a double line of the first MTP joint with small effusion and hyperechoic changes to suggest gouty changes in this area as well.   Summary: Findings suggest symptoms are most likely related to gouty change.  Ultrasound and interpretation by Clare GandyJeremy Schmitz, MD      ASSESSMENT & PLAN:   Left foot pain Scanning seems more consistent with gout due to the large area of change as well as swelling and redness.  Less likely for stress fracture.  He is diabetic as well as on Eliquis.  Not likely for tendinitis -Colchicine. -Counseled on supportive care. -Would you do if given prednisone if no improvement will consider prednisone.  Could  consider a cam walker and x-ray of concern for stress fracture.

## 2019-04-12 NOTE — Assessment & Plan Note (Signed)
Scanning seems more consistent with gout due to the large area of change as well as swelling and redness.  Less likely for stress fracture.  He is diabetic as well as on Eliquis.  Not likely for tendinitis -Colchicine. -Counseled on supportive care. -Would you do if given prednisone if no improvement will consider prednisone.  Could consider a cam walker and x-ray of concern for stress fracture.

## 2019-04-12 NOTE — Patient Instructions (Signed)
Nice to meet you Please try the medicine until you don't have any swelling or pain and then 2 days after that.  Please try ice and elevation.   Please send me a message in MyChart with any questions or updates.  Please see me back in 4 weeks. We can consider making custom orthotics once your pain has improved.   --Dr. Raeford Razor

## 2019-04-17 ENCOUNTER — Telehealth: Payer: Self-pay | Admitting: Pharmacist

## 2019-04-17 NOTE — Patient Outreach (Signed)
[place] [facility]) Care Management  04/17/2019  Ryan [name] 11/24/1945 [id number]  Patient was called to follow up on medication assistance and to tie up some loose ends with his financial documentation.  Based on the number of people in his household per the information sent, a  LIS application needs to be completed.  In addition, an actual 1040 is necessary vs the child income credit paperwork he sent.  Plan: Send an unsuccessful contact letter. Call patient back in 7-10 business days.  Elayne Guerin, PharmD, [place] Clinical Pharmacist [phone]

## 2019-04-17 NOTE — Telephone Encounter (Signed)
-----   Message from [employer], CPhT sent at 04/06/2019 11:52 AM EDT ----- Ryan [name] 2 things with this patient:  1) pt needs to apply for lis based on # ppl houshold is 5, # below 31 is 1 , income is 37,134.  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 They need the 1040.  Thanks, Sharee Pimple

## 2019-04-19 ENCOUNTER — Other Ambulatory Visit: Payer: Self-pay | Admitting: Pharmacist

## 2019-04-19 ENCOUNTER — Ambulatory Visit: Payer: Self-pay | Admitting: Pharmacist

## 2019-04-19 NOTE — Patient Outreach (Signed)
[place] [facility]) Care Management  04/19/2019  Ryan [name] June 21, 1946 [id number]   Called patient to follow up on medication assistance and to complete an Extra Help application based on the number of people listed on his tax return and financial documentation. Unfortunately, he did not answer the phone. HIPAA compliant message was left on his voicemail.   Plan: Send patient an unsuccessful outreach letter. Call patient back in 7-10 business days.  Elayne Guerin, PharmD, [place] Clinical Pharmacist [phone]

## 2019-04-25 ENCOUNTER — Other Ambulatory Visit: Payer: Self-pay | Admitting: Pharmacy Technician

## 2019-04-25 NOTE — Patient Outreach (Signed)
[place] [facility]) Care Management  04/25/2019  Ryan [name] 12/04/1945 [id number]   Successful outreach call placed to patient in regards to [employer] application for  [employer].  Spoke to patient, HIPAA identifiers verified.  Informed patient that [employer] would not accept his proof of income that was submitted. He informed he received a letter stating he needed to submit his form 1099 or a tax return 1040. Patient informed he would locate this information and mail it in.  Verified with patient that there are 5 members in the household of which 1 was under the age of 42. Verified income amount with patient that he wrote on the application. Informed patient that [employer] is requiring patient to apply for low income subsidy/extra help based on the information he submitted. Informed him that Beckley had tried to outreach him to help him complete this online. Informed if he would be willing for her to outreach him again. Patient agreed. Informed patient to be expecting a call from her in the next few days. Patient verbalized understanding.  Will followup with patient in 4-5 weeks to allow time for him to receive the final decisional letter from social security.  Ryan [name], [place] Management [phone]

## 2019-04-27 ENCOUNTER — Ambulatory Visit: Payer: Self-pay | Admitting: Pharmacist

## 2019-04-27 ENCOUNTER — Other Ambulatory Visit: Payer: Self-pay | Admitting: Pharmacist

## 2019-04-27 NOTE — Patient Outreach (Addendum)
[place] [facility]) Care Management  04/27/2019  Ryan [name] [birth year] [id number]   Called patient to complete an LIS application. Unfortunately, he did not answer the phone. HIPAA compliant message was left with a male who answered the phone.   Plan: Call the patient back in 10-14 business days. Send patient an unsuccessful letter.   Elayne Guerin, PharmD, [place] Clinical Pharmacist [phone]  ADDENDUM  Patient called me back. HIPAA identifiers were obtained x2.    An Extra Help Application was completed on the patient's behalf on the ssa.gov website.  Patient was instructed to be watching for a determination letter from [employer]. Based on the patient's resources he will most likely be denied. A denial letter is necessary for the patient to continue in the patient assistance process per [employer] Patient Assistance Program.  Patient will be sent a self addressed envelope to send the denial letter back in when it arrives.  Plan: Ryan [name] will follow for patient assistance. Follow up with patient in 4-6 weeks.   Elayne Guerin, PharmD, [place] Clinical Pharmacist [phone]

## 2019-05-02 ENCOUNTER — Ambulatory Visit: Payer: Self-pay | Admitting: Pharmacist

## 2019-05-07 MED FILL — METOPROLOL SUCCINATE ER 25: 25 | 30 days supply | Qty: 30 | Fill #1

## 2019-05-07 MED FILL — ATORVASTATIN 40 MG TABLET: 40 | 30 days supply | Qty: 30 | Fill #1

## 2019-05-10 ENCOUNTER — Encounter: Payer: Self-pay | Admitting: Family Medicine

## 2019-05-10 ENCOUNTER — Other Ambulatory Visit: Payer: Self-pay

## 2019-05-10 ENCOUNTER — Ambulatory Visit: Payer: HMO | Admitting: Family Medicine

## 2019-05-10 ENCOUNTER — Other Ambulatory Visit: Payer: Self-pay | Admitting: Family

## 2019-05-10 DIAGNOSIS — M79672 Pain in left foot: Secondary | ICD-10-CM

## 2019-05-10 DIAGNOSIS — I10 Essential (primary) hypertension: Secondary | ICD-10-CM

## 2019-05-10 MED FILL — ELIQUIS 5 MG TABLET: 5 | 30 days supply | Qty: 60 | Fill #6

## 2019-05-10 MED FILL — LISINOPRIL 5 MG TABLET: 5 | 30 days supply | Qty: 30 | Fill #0

## 2019-05-10 MED FILL — COLCHICINE 0.6 MG TABS: 0.6 | 30 days supply | Qty: 60 | Fill #1

## 2019-05-10 NOTE — Assessment & Plan Note (Signed)
Pain likely result of an acute gout flare.  Had good response with colchicine and normal uric acid on 8/28. -Can stop the colchicine for now.  Advised to pick more up so we can take it if his symptoms return. -Counseled on diet. -Counseled on supportive care. -Can follow-up as needed.

## 2019-05-10 NOTE — Progress Notes (Signed)
Ryan [name] - 73 y.o. male MRN [id number]  Date of birth: 06/25/1946  SUBJECTIVE:  Including CC & ROS.  Chief Complaint  Patient presents with  . Follow-up    follow up for left foot    Ryan [name] is a 73 y.o. male that is following up for his left foot pain.  He was prescribed colchicine and has noticed complete resolution of his symptoms.  His uric acid was normal at 4.4.  Likely an acute gout flare with no chronic stipulations.  He is still currently taking the colchicine.  Not having any pain with walking or any other time of the day.   Review of Systems  Constitutional: Negative for fever.  HENT: Negative for congestion.   Respiratory: Negative for cough.   Cardiovascular: Negative for chest pain.  Gastrointestinal: Negative for abdominal pain.  Musculoskeletal: Positive for arthralgias.  Neurological: Negative for weakness.  Hematological: Negative for adenopathy.    HISTORY: Past Medical, Surgical, Social, and Family History Reviewed & Updated per EMR.   Pertinent Historical Findings include:  Past Medical History:  Diagnosis Date  . Allergy   . Diabetes mellitus   . GERD (gastroesophageal reflux disease)   . History of chicken pox   . Hyperlipidemia   . Hypertension   . Myocardial infarction ([place]) 2017  . Type 2 diabetes mellitus with hyperglycemia, with long-term current use of insulin ([place]) 09/08/2015    Past Surgical History:  Procedure Laterality Date  . EYE SURGERY  1998   bilateral  . SHOULDER SURGERY  1990   right shoulder, torn rotator cuff.    Allergies  Allergen Reactions  . Other Other (See Comments)    Client states he became very sick and had significant skin inflammation and irritation with the Freestyle Libre sensor    Family History  Problem Relation Age of Onset  . Cancer Mother        cancer  . Heart disease Mother   . Hyperlipidemia Mother   . Hypertension Mother   . Hyperlipidemia Father   . Stroke Father   . Hypertension  Father   . Diabetes Paternal Grandfather      Social History   Socioeconomic History  . Marital status: Widowed    Spouse name: Not on file  . Number of children: 1  . Years of education: Not on file  . Highest education level: Not on file  Occupational History  . Not on file  Social Needs  . Financial resource strain: Not hard at all  . Food insecurity    Worry: Never true    Inability: Never true  . Transportation needs    Medical: No    Non-medical: No  Tobacco Use  . Smoking status: Former Smoker    Types: Cigarettes    Quit date: 09/01/1965    Years since quitting: 53.7  . Smokeless tobacco: Never Used  Substance and Sexual Activity  . Alcohol use: No  . Drug use: No  . Sexual activity: Never  Lifestyle  . Physical activity    Days per week: 7 days    Minutes per session: 30 min  . Stress: Only a little  Relationships  . Social connections    Talks on phone: More than three times a week    Gets together: More than three times a week    Attends religious service: Never    Active member of club or organization: Not on file    Attends meetings of  clubs or organizations: Not on file    Relationship status: Widowed  . Intimate partner violence    Fear of current or ex partner: No    Emotionally abused: No    Physically abused: No    Forced sexual activity: No  Other Topics Concern  . Not on file  Social History Narrative   Regular exercise:  5 x weekly   Caffeine Use: 1 cups coffee daily.   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   Married- One son and grandson- both live with them   Wife died from ovarian cancer in 10-19-2014           PHYSICAL EXAM:  VS: BP (!) 149/84   Pulse 62   Ht '5\' 10"'$  (1.778 m)   Wt 169 lb (76.7 kg)   BMI 24.25 kg/m  Physical Exam Gen: NAD, alert, cooperative with exam, well-appearing ENT: normal lips, normal nasal mucosa,  Eye: normal EOM, normal conjunctiva and lids CV:  no edema, +2 pedal pulses   Resp: no accessory muscle use,  non-labored,  Skin: no rashes, no areas of induration  Neuro: normal tone, normal sensation to touch Psych:  normal insight, alert and oriented MSK:  Left foot: No redness or swelling. Normal range of motion. Normal strength resistance. Neurovascular intact     ASSESSMENT & PLAN:   Left foot pain Pain likely result of an acute gout flare.  Had good response with colchicine and normal uric acid on 8/28. -Can stop the colchicine for now.  Advised to pick more up so we can take it if his symptoms return. -Counseled on diet. -Counseled on supportive care. -Can follow-up as needed.

## 2019-05-15 ENCOUNTER — Other Ambulatory Visit: Payer: Self-pay

## 2019-05-15 ENCOUNTER — Ambulatory Visit (INDEPENDENT_AMBULATORY_CARE_PROVIDER_SITE_OTHER): Payer: HMO

## 2019-05-15 DIAGNOSIS — Z23 Encounter for immunization: Secondary | ICD-10-CM

## 2019-05-31 ENCOUNTER — Other Ambulatory Visit: Payer: Self-pay | Admitting: Pharmacy Technician

## 2019-05-31 NOTE — Patient Outreach (Signed)
[place] [facility]) Care Management  05/31/2019  HAYVEN FATIMA 1946-08-05 [id number]    Unsuccessful outreach call placed to patient in regards to [employer] application for [employer].  Unfortunately patient did not answer the phone, HIPAA compliant voicemail left on BOTH home and mobile numbers.  Was calling patient to inquire if he was able to locate his financials and to inquire if he has received a letter from social security concerning his LIS/Extra help decision.  Will attempt another outreach in 5-7 business days if call is not returned.  Izaias Krupka P. Merridy Pascoe, [place] Management [phone]

## 2019-06-01 MED FILL — NovoLOG 100 UNIT/ML SOLN: 100 | 67 days supply | Qty: 30 | Fill #1

## 2019-06-01 MED FILL — LEVEMIR 100 UNITS/ML VIAL: 100 | 67 days supply | Qty: 30 | Fill #1

## 2019-06-04 MED FILL — ATORVASTATIN 40 MG TABLET: 40 | 30 days supply | Qty: 30 | Fill #2

## 2019-06-04 MED FILL — METOPROLOL SUCCINATE ER 25: 25 | 30 days supply | Qty: 30 | Fill #2

## 2019-06-05 ENCOUNTER — Other Ambulatory Visit: Payer: Self-pay | Admitting: Pharmacy Technician
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
## 2019-06-06 ENCOUNTER — Other Ambulatory Visit: Payer: Self-pay | Admitting: Pharmacist

## 2019-06-06 ENCOUNTER — Ambulatory Visit: Payer: Self-pay | Admitting: Pharmacist

## 2019-06-06 NOTE — Patient Outreach (Addendum)
[name]) Care Management  06/06/2019  Ryan [name] 1946/01/09 [id number]   Patient was called to follow up on medication assistance. Unfortunately, he did not answer either of his phones. A message was left on his home voicemail.  A gentleman answered the number listed as his mobile number and a HIPAA compliant message was left with him.  Susy Frizzle, CPhT sent me a message stating the patient assistance program needs an updated proof of income and his LIS letter if he was denied.  Plan: Send patient an unsuccessful outreach letter.  Follow up in 4-6 weeks. Sharee Pimple Simcox will also follow.   Elayne Guerin, PharmD, [place] Clinical Pharmacist [phone]  ADDENDUM  Patient called back. HIPAA identifiers were obtained. Patient explained that a Chief Executive Officer called him and requested additional information to make a determination about LIS/Extra Help. As such, he has not received a letter yet.   Patient apparently sent a copy of his child income tax credit vs the actual tax return. He will be sent a letter with a return addressed envelope for him to send a copy of his actual tax return.  Elayne Guerin, PharmD, [employer] Clinical Pharmacist [phone]

## 2019-06-12 ENCOUNTER — Other Ambulatory Visit: Payer: Self-pay | Admitting: Internal Medicine

## 2019-06-12 MED FILL — LISINOPRIL 5 MG TABLET: 5 | 30 days supply | Qty: 30 | Fill #1

## 2019-06-12 MED FILL — ELIQUIS 5 MG TABLET: 5 | 30 days supply | Qty: 60 | Fill #0

## 2019-06-12 NOTE — Telephone Encounter (Signed)
Eliquis 5mg  refill request received. Pt is 73yrs old, weight-76.7kg, Crea-0.98 on 04/06/2019, Diagnosis-Atrial flutter, and last seen by [facility] NP 11/02/2018. Dose is appropriate based on dosing criteria. Will send in refill to requested pharmacy.

## 2019-06-25 ENCOUNTER — Other Ambulatory Visit: Payer: Self-pay | Admitting: Family

## 2019-06-26 ENCOUNTER — Other Ambulatory Visit: Payer: Self-pay | Admitting: Pharmacy Technician

## 2019-06-26 NOTE — Patient Outreach (Signed)
[place] [facility]) Care Management  06/26/2019  Ryan [name] [birth year] [id number]    Received 1040 worksheets (not the 1040 itself)  and a pre decisional notice (not a final notice)  for denial of LIS from patient to submit to [employer] for consideration into their patient assistance program for 2020 for Levemir and Novolog.  Submitted the information that was provided though not sure if the PAP will accept the documents.  Will follow up with [employer] in 2-3 business days to inquire about status of application.  Jaidin Richison P. Ercia Crisafulli, [place] Management [phone]

## 2019-06-27 MED FILL — METOPROLOL SUCCINATE ER 25: 25 | 30 days supply | Qty: 30 | Fill #0

## 2019-06-27 MED FILL — ATORVASTATIN 40 MG TABLET: 40 | 30 days supply | Qty: 30 | Fill #0

## 2019-06-28 ENCOUNTER — Other Ambulatory Visit: Payer: Self-pay | Admitting: Pharmacy Technician

## 2019-06-28 NOTE — Patient Outreach (Signed)
[place] [facility]) Care Management  06/28/2019  FLYNT BREEZE 12/13/1945 [id number]   Care coordination call placed to [place] in regards to patient's application for Levemir and Novolog.  Spoke to [place] who informed they were unable to read the proof of income that was submitted. He informed it came over blurry. He requested that it be refaxed.  Refaxed document to [employer]. Jarleo informed to check back tomorrow to see if this fax was more legible.  Will followup with [employer] tomorrow to inquire on status of application.  Coston Mandato P. Luvinia Lucy, [place] Management [phone]

## 2019-06-29 ENCOUNTER — Other Ambulatory Visit: Payer: Self-pay | Admitting: Pharmacy Technician

## 2019-06-29 NOTE — Patient Outreach (Signed)
[facility]) Care Management  06/29/2019  Ryan [name] 1946/02/04 [id number]   Care coordination call placed to [place] in regards to patient's application for Levemir and Novolog.  Spoke to [place] who informed the new information has not been attached to the patient's file. He informed that could take up to 24-48 hours. Ryan [name]' suggestion is to try back later today, they close at 8pm, or to try back sometime next week.  Will followup with [employer] in 2-3 business days.  [name] P. Abdou Stocks, [place] Management [phone]

## 2019-07-02 ENCOUNTER — Other Ambulatory Visit: Payer: Self-pay | Admitting: Pharmacy Technician

## 2019-07-02 ENCOUNTER — Other Ambulatory Visit: Payer: Self-pay

## 2019-07-02 ENCOUNTER — Telehealth: Payer: Self-pay | Admitting: Pharmacist

## 2019-07-02 NOTE — Patient Outreach (Addendum)
[place]) Care Management  07/02/2019  Ryan [name] 12-15-1945 [id number]   Patient was called to follow up on medication assistance. HIPAA identifiers were obtained. It was explained to the patient that [employer] would not accept the proof of income the patient submitted. They require pages 1-2 of the actual 1040 and the patient sent the tax worksheet.  Patient was given the address for [facility] and reminded that the cut of for 2020 with [place] in 07/09/2019.  He said he will look for pages 1-2 and send them in  ASAP.  Plan: Susy Frizzle will follow for patient assistance. Pharmacist will follow up with patient in 2021.  Elayne Guerin, PharmD, [place] Clinical Pharmacist [phone]

## 2019-07-02 NOTE — Telephone Encounter (Signed)
-----   Message from Jason Fila, CPhT sent at 07/02/2019  9:27 AM EST ----- Juluis Rainier, POI he sent in they will not accept

## 2019-07-02 NOTE — Patient Outreach (Signed)
[facility]) Care Management  07/02/2019  [name] 1945/11/03 [id number]   Care coordination call placed to [place] in regards to patient's application for Levemir and Novolog.  Spoke to [employer] who informed the proof of income that was submitted is NOT a valid form that they can accept. It is a 1040 WORKSHEET and they need the actual [place]. Other acceptable forms of proof of income could include W2, social security benefit statements or 1099. They need to see where the income is coming from and that is why the worksheet is NOT acceptable.  Will route note to [place] for assistance with this matter as the enrollment window closes 07/09/2019 for 2020 patient assistance consideration.  Judieth Mckown P. Lavada Langsam, [place] Management [phone]

## 2019-07-03 ENCOUNTER — Encounter: Payer: Self-pay | Admitting: Family

## 2019-07-03 ENCOUNTER — Other Ambulatory Visit: Payer: Self-pay

## 2019-07-03 ENCOUNTER — Ambulatory Visit (INDEPENDENT_AMBULATORY_CARE_PROVIDER_SITE_OTHER): Payer: HMO | Admitting: Family

## 2019-07-03 VITALS — BP 138/74 | HR 61 | Temp 97.3°F | Resp 16 | Ht 69.0 in | Wt 170.0 lb

## 2019-07-03 DIAGNOSIS — E11649 Type 2 diabetes mellitus with hypoglycemia without coma: Secondary | ICD-10-CM | POA: Diagnosis not present

## 2019-07-03 DIAGNOSIS — E7800 Pure hypercholesterolemia, unspecified: Secondary | ICD-10-CM

## 2019-07-03 DIAGNOSIS — I10 Essential (primary) hypertension: Secondary | ICD-10-CM | POA: Diagnosis not present

## 2019-07-03 LAB — BASIC METABOLIC PANEL
BUN: 14 mg/dL (ref 6–23)
CO2: 29 mEq/L (ref 19–32)
Calcium: 9.8 mg/dL (ref 8.4–10.5)
Chloride: 101 mEq/L (ref 96–112)
Creatinine, Ser: 0.92 mg/dL (ref 0.40–1.50)
GFR: 80.61 mL/min (ref >60.00)
Glucose, Bld: 111 mg/dL — ABNORMAL HIGH (ref 70–99)
Potassium: 4.3 mEq/L (ref 3.5–5.1)
Sodium: 136 mEq/L (ref 135–145)
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
## 2019-07-03 NOTE — Patient Instructions (Signed)
Please complete lab work prior to leaving. Keep your upcoming appointment with Dr. Cruzita Lederer.

## 2019-07-10 ENCOUNTER — Other Ambulatory Visit: Payer: Self-pay | Admitting: Pharmacy Technician

## 2019-07-10 NOTE — Patient Outreach (Signed)
[place] [facility]) Care Management  07/10/2019  JAMA KRICHBAUM 21-Apr-1946 [id number]    Care coordination note being sent to [place] in regards to [facility] application for [employer].  [employer] patient assistance program for 2020 has ended as of 07/09/2019.  Will route note to [place] that patient's case will be closed due to not receiving the necessary documents back in time for 2020 enrollment. Patient may apply again in January if he chooses. The supporting documentation will still need to be sent in with the new application. Will remove myself from care team.  Luiz Ochoa. Shawneen Deetz, [place] Management [phone]

## 2019-07-11 MED FILL — ELIQUIS 5 MG TABLET: 5 | 30 days supply | Qty: 60 | Fill #0

## 2019-07-11 MED FILL — LISINOPRIL 5 MG TABS: 5 | 30 days supply | Qty: 30 | Fill #0

## 2019-07-12 ENCOUNTER — Ambulatory Visit: Payer: Self-pay | Admitting: Pharmacist

## 2019-07-13 ENCOUNTER — Other Ambulatory Visit: Payer: Self-pay

## 2019-07-13 ENCOUNTER — Ambulatory Visit (INDEPENDENT_AMBULATORY_CARE_PROVIDER_SITE_OTHER): Payer: HMO | Admitting: Internal Medicine

## 2019-07-13 ENCOUNTER — Encounter: Payer: Self-pay | Admitting: Internal Medicine

## 2019-07-13 DIAGNOSIS — E1159 Type 2 diabetes mellitus with other circulatory complications: Secondary | ICD-10-CM

## 2019-07-13 DIAGNOSIS — E1165 Type 2 diabetes mellitus with hyperglycemia: Secondary | ICD-10-CM

## 2019-07-13 DIAGNOSIS — E785 Hyperlipidemia, unspecified: Secondary | ICD-10-CM | POA: Diagnosis not present

## 2019-07-13 DIAGNOSIS — E663 Overweight: Secondary | ICD-10-CM | POA: Diagnosis not present

## 2019-07-13 MED ORDER — DEXCOM G6 TRANSMITTER MISC: 1.0000 | 3 refills | Status: DC

## 2019-07-13 MED ORDER — DEXCOM G6 SENSOR MISC: 1.0000 | 3 refills | Status: AC

## 2019-07-13 MED ORDER — GLUCAGON 3 MG/DOSE NA POWD: 3.0000 mg | Freq: Once | NASAL | 11 refills | Status: DC | PRN

## 2019-07-13 MED ORDER — INSULIN ASPART 100 UNIT/ML ~~LOC~~ SOLN: SUBCUTANEOUS | 5 refills | Status: DC

## 2019-07-13 MED ORDER — GLUCAGON EMERGENCY 1 MG IJ KIT: 1.0000 mg | PACK | Freq: Once | INTRAMUSCULAR | 12 refills | Status: DC | PRN

## 2019-07-13 MED ORDER — DEXCOM G6 RECEIVER DEVI: 1.0000 | Freq: Once | 0 refills | Status: AC

## 2019-07-13 MED FILL — GLUCAGON 1 MG EMERGENCY KIT: 1 | 1 days supply | Qty: 1 | Fill #0
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
## 2019-07-13 NOTE — Patient Instructions (Signed)
Please continue: - Levemir 30 units at bedtime  Please decrease: - Novolog mealtime 5-8 units 3x a day before meals (if you are active after a meal, take no more than 5-6 units before that meal) - Novolog sliding scale as follows:  - 150-175: + 1 unit  - 176-200: + 2 units  - 201-225: + 3 units  - 226-250: + 4 units  - >250: + 5 units  Try to obtain the Dexcom CGM from [place].  Please return in 3 months with your sugar log.

## 2019-07-26 ENCOUNTER — Other Ambulatory Visit: Payer: Self-pay | Admitting: *Deleted

## 2019-07-26 ENCOUNTER — Ambulatory Visit: Payer: Self-pay | Admitting: *Deleted

## 2019-07-26 NOTE — Patient Outreach (Addendum)
  [facility]) Care Management Chronic Special Needs Program    07/26/2019  Name: Ryan [name], DOB: [birth year]  MRN: [id number]   Ryan. Ryan [name] is enrolled in a chronic special needs plan for Diabetes. Reached Ryan [name] via home number and explained purpose of call; to schedule follow up assessment . He also says he is having difficulty determining if his insurance will cover the Dexcom G6 sensor.  Plan: Advised Ryan [name] this RNCM will contact [employer] concierge to determine benefit coverage for the Dexcom sensor. Per client request, scheduled follow up assessment outreach for 12/21 at 11:00 am.  Kelli Churn RN, CCM, [place] Management Coordinator [place] Management [phone]

## 2019-07-30 ENCOUNTER — Encounter: Payer: Self-pay | Admitting: *Deleted

## 2019-07-30 ENCOUNTER — Other Ambulatory Visit: Payer: Self-pay | Admitting: *Deleted
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
## 2019-07-31 MED FILL — ATORVASTATIN 40 MG TABLET: 40 | 30 days supply | Qty: 30 | Fill #1

## 2019-07-31 MED FILL — METOPROLOL SUCCINATE ER 25: 25 | 30 days supply | Qty: 30 | Fill #1

## 2019-08-06 MED FILL — NovoLOG 100 UNIT/ML SOLN: 100 | 84 days supply | Qty: 30 | Fill #0

## 2019-08-06 MED FILL — LEVEMIR 100 UNITS/ML VIAL: 100 | 67 days supply | Qty: 20 | Fill #0

## 2019-08-13 MED FILL — ELIQUIS 5 MG TABLET: 5 | 30 days supply | Qty: 60 | Fill #1

## 2019-08-13 MED FILL — LISINOPRIL 5 MG TABS: 5 | 30 days supply | Qty: 30 | Fill #1

## 2019-08-15 ENCOUNTER — Telehealth: Payer: Self-pay

## 2019-08-15 NOTE — Telephone Encounter (Signed)
[employer] Patient Assistance Program application received via [facility], request for Levemir and Novolog has been filled out, signed by Dr. Elvera Lennox and faxed back to [facility] with confirmation.

## 2019-08-16 ENCOUNTER — Other Ambulatory Visit: Payer: Self-pay | Admitting: Pharmacy Technician

## 2019-08-16 ENCOUNTER — Other Ambulatory Visit: Payer: Self-pay | Admitting: Pharmacist

## 2019-08-16 NOTE — Patient Outreach (Signed)
[facility]) Care Management  08/16/2019  Ryan [name] [birth year] [id number]    Received both patient and provider portion(s) of patient assistance application(s) for Levemir and Novolog. Faxed completed application and required documents into [employer].  Will follow up with company(ies) in 5-7 business days to check status of application(s).  Gregorio Worley P. Mumtaz Lovins, CPhT [facility] Care Management [phone]

## 2019-08-16 NOTE — Patient Outreach (Signed)
[facility]) Care Management  08/16/2019  Ryan [name] Jun 20, 1946 [id number]   Patient was called regarding medication assistance. HIPAA identifiers were obtained. Patient was asked about the medication assistance forms and the requested financial documents. Patient said he mailed them 2-3 weeks ago.  Called and spoke with Pattricia Boss, CPhT who said she received the patient's forms and sent everything off for the patient today. (She had not had time to document sending the forms when I called the patient).  Patient was called back and informed we received his forms and had sent everything off.  Plan: Noreene Larsson will follow up with the patient on the process of his application. Call patient back in 6-8 weeks.  Beecher Mcardle, PharmD, BCACP [facility] Clinical Pharmacist [phone]

## 2019-08-21 ENCOUNTER — Other Ambulatory Visit: Payer: Self-pay | Admitting: Pharmacy Technician

## 2019-08-21 ENCOUNTER — Telehealth: Payer: Self-pay

## 2019-08-21 NOTE — Patient Outreach (Signed)
[facility]) Care Management  08/21/2019  Ryan [name] 03/17/1946 [id number]    Care coordination call placed to [employer] in regards to patient's application for Novolog and Levemir Flex pens.  Spoke to [name] who informed patient has been APPROVED 08/19/2019-07/08/2020. Patient will be receiving a 4 month supply of medication delivered to the providers office in the next 10-14 business days. The order was placed on 08/20/2019. The next refill order that the provider can request will be around 11/08/2019. The patient's ID is [id number].  Will follow up with patient in 14-21 business days to confirm receipt of medication.  Candise Crabtree P. Yasenia Reedy, CPhT [facility] Care Management [phone]

## 2019-08-21 NOTE — Telephone Encounter (Signed)
Patient has been approved for [employer] PAP.  Letter sent to scan.

## 2019-08-29 ENCOUNTER — Telehealth: Payer: Self-pay

## 2019-08-29 NOTE — Telephone Encounter (Signed)
Notified patient he can pick these up, he will come by tomorrow.

## 2019-08-29 NOTE — Telephone Encounter (Signed)
Patient assistance medications from [employer] has arrived and ready for pick up:  Novolog Levemir Pen needles

## 2019-09-03 MED FILL — METOPROLOL SUCCINATE ER 25: 25 | 30 days supply | Qty: 30 | Fill #2

## 2019-09-03 MED FILL — ATORVASTATIN 40 MG TABLET: 40 | 30 days supply | Qty: 30 | Fill #2

## 2019-09-06 ENCOUNTER — Other Ambulatory Visit: Payer: Self-pay | Admitting: Pharmacy Technician

## 2019-09-06 NOTE — Patient Outreach (Signed)
[facility]) Care Management  09/06/2019  Ryan [name] 1946-06-30 [id number]    Successful call placed to patient regarding patient assistance medication delivery of Novolog and Levemir with [employer], HIPAA identifiers verified.   Patient informed he received 2 boxes of Novolog and 3 boxes of Levemir. Informed patient his provider will receive a fax indicating his refills are due. They then have to fill out a form and fax it back to [employer]. Informed patient to call his provider's office when he has a 2 week supply remaining to inform them he needs a refill to avoid a delay in therapy. Patient informed he has no other questions or concerns. Patient verbalized understanding and confirmed having name and number.  Follow up:  Will route note to [facility] RPh Nunzio Cobbs for case closure and will remove myself from care team.  [name]. Solmon Bohr, CPhT [facility] Care Management [phone]

## 2019-09-10 ENCOUNTER — Other Ambulatory Visit: Payer: Self-pay | Admitting: Family

## 2019-09-10 DIAGNOSIS — I10 Essential (primary) hypertension: Secondary | ICD-10-CM

## 2019-09-10 MED FILL — ELIQUIS 5 MG TABLET: 5 | 30 days supply | Qty: 60 | Fill #2

## 2019-09-10 MED FILL — LISINOPRIL 5 MG TABS: 5 | 30 days supply | Qty: 30 | Fill #0

## 2019-09-19 ENCOUNTER — Telehealth: Payer: Self-pay | Admitting: Family

## 2019-09-19 NOTE — Chronic Care Management (AMB) (Signed)
  Chronic Care Management   Note  09/19/2019 Name: Ryan [name] MRN: [id number] DOB: 1946/06/15  Ryan [name] is a 74 y.o. year old male who is a primary care patient of [name], NP. I reached out to [name] by phone today in response to a referral sent by [name], [name], NP.   [name] was given information about Chronic Care Management services today including:  1. CCM service includes personalized support from designated clinical staff supervised by his physician, including individualized plan of care and coordination with other care providers 2. 24/7 contact phone numbers for assistance for urgent and routine care needs. 3. Service will only be billed when office clinical staff spend 20 minutes or more in a month to coordinate care. 4. Only one practitioner may furnish and bill the service in a calendar month. 5. The patient may stop CCM services at any time (effective at the end of the month) by phone call to the office staff. 6. The patient will be responsible for cost sharing (co-pay) of up to 20% of the service fee (after annual deductible is met).  Patient agreed to services and verbal consent obtained.   Follow up plan:   Raynicia Dukes [employer] Scheduler

## 2019-09-20 ENCOUNTER — Other Ambulatory Visit: Payer: Self-pay

## 2019-09-20 DIAGNOSIS — E7800 Pure hypercholesterolemia, unspecified: Secondary | ICD-10-CM

## 2019-09-20 DIAGNOSIS — I10 Essential (primary) hypertension: Secondary | ICD-10-CM

## 2019-09-20 DIAGNOSIS — E11649 Type 2 diabetes mellitus with hypoglycemia without coma: Secondary | ICD-10-CM

## 2019-09-25 ENCOUNTER — Other Ambulatory Visit: Payer: Self-pay

## 2019-09-25 ENCOUNTER — Ambulatory Visit: Payer: HMO | Admitting: Pharmacist

## 2019-09-25 DIAGNOSIS — M79672 Pain in left foot: Secondary | ICD-10-CM

## 2019-09-25 DIAGNOSIS — K219 Gastro-esophageal reflux disease without esophagitis: Secondary | ICD-10-CM

## 2019-09-25 DIAGNOSIS — M85851 Other specified disorders of bone density and structure, right thigh: Secondary | ICD-10-CM

## 2019-09-25 DIAGNOSIS — E119 Type 2 diabetes mellitus without complications: Secondary | ICD-10-CM

## 2019-09-25 DIAGNOSIS — I4892 Unspecified atrial flutter: Secondary | ICD-10-CM

## 2019-09-25 DIAGNOSIS — I10 Essential (primary) hypertension: Secondary | ICD-10-CM

## 2019-09-25 DIAGNOSIS — Z794 Long term (current) use of insulin: Secondary | ICD-10-CM

## 2019-09-25 DIAGNOSIS — E7800 Pure hypercholesterolemia, unspecified: Secondary | ICD-10-CM
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
## 2019-09-27 ENCOUNTER — Other Ambulatory Visit: Payer: Self-pay | Admitting: Pharmacist

## 2019-09-27 NOTE — Patient Outreach (Signed)
[facility]) Care Management  09/27/2019  Ryan [name] [birth year] [id number]  Patient was called for follow up. HIPAA identifiers were obtained.  Patient is a 74 year old male with multiple medical conditions including but not limited to:  Allergic rhinitis, atrial flutter, GERD, hypertension, hyperlipidemia, osteopenia, and type 2 diabetes.  [facility] assisted the patient with receiving Novolog and Levemir through [place] Patient Assistance Program.  Patient confirmed receipt of his insulin with Susy Frizzle, CPhT  Medications were reviewed via telephone:  Medications Reviewed Today    Reviewed by Elayne Guerin, [facility] (Pharmacist) on 09/27/19 at 1425  Med List Status: <None>  Medication Order Taking? Sig Documenting Provider Last Dose Status Informant  aspirin 81 MG tablet [id number] Yes Take 81 mg by mouth daily.   [provider] Taking Active Self  atorvastatin (LIPITOR) 40 MG tablet [id number] Yes TAKE 1 TABLET BY MOUTH ONCE DAILY **NEED FOLLOW UP FOR FURTHER REFILLSInda Castle, [name], NP Taking Active   Calcium Carbonate-Vitamin D (CALTRATE 600+D) 600-400 MG-UNIT per tablet [id number] Yes Take 1 tablet by mouth 2 (two) times daily. Reported on 01/27/2016 [provider] Taking Active Self  colchicine 0.6 MG tablet [id number] No Take 1 tablet (0.6 mg total) by mouth 2 (two) times daily.  Patient not taking: Reported on 07/03/2019   [name], MD Not Taking Active            Med Note Katrine Coho Jul 30, 2019 11:40 AM) Client states MD told him to keep the medication around in case he needs it   Continuous Blood Gluc Transmit (DEXCOM G6 TRANSMITTER) MISC [id number]  1 Device by Does not apply route every 3 (three) months.  Patient not taking: Reported on 09/25/2019   [name], MD  Active            Med Note De Blanch   Tue Sep 25, 2019 11:23 AM) Still has not received  ELIQUIS 5 MG TABS tablet [id number] Yes TAKE 1 TABLET  (5 MG TOTAL) BY MOUTH 2 (TWO) TIMES DAILY. Fay Records, MD Taking Active   glucagon [facility] EMERGENCY) 1 MG injection [id number] Yes Inject 1 mg into the muscle once as needed for up to 1 dose. [name], MD Taking Active   Glucagon 3 MG/DOSE POWD [id number] No Place 3 mg into the nose once as needed for up to 1 dose. [name], MD Unknown Active   insulin aspart (NOVOLOG) 100 UNIT/ML injection [id number] Yes INJECT 5-8 UNITS INTO THE SKIN 3 TIMES A DAY WITH MEALS [name], MD Taking Active   insulin detemir (LEVEMIR) 100 UNIT/ML injection [id number] Yes INJECT 0.30 ML (30 UNITS) INTO THE SKIN AT BEDTIME [name], MD Taking Active   lisinopril (ZESTRIL) 5 MG tablet [id number] Yes TAKE 1 TABLET BY MOUTH DAILY. [name], NP Taking Active   metoprolol succinate (TOPROL-XL) 25 MG 24 hr tablet [id number] Yes TAKE 1 TABLET BY MOUTH ONCE DAILY **NEEDS FOLLOW UP FOR FURTHER REFILLSDebbrah Alar, NP Taking Active   TRUE[employer]US INSULIN SYRINGE 31G X 5/16" 0.5 ML MISC [id number] Yes USE TO INJECT INSULIN 4 TIMES A [name], MD Taking Active          Findings: HgA1c 6.8% 01/2019 On statin-atorvastatin 30 tablets filled 09/03/2019 (will ask about 90 day supply at next call)  Plan Investigate 90 day supplies at next call Follow up in 2-3 months. Eliquis patient assistance can be researched at that time  as well since he has to spend 3% of his income on medication related expenses to qualify.   Elayne Guerin, PharmD, [place] Clinical Pharmacist [phone]

## 2019-09-30 ENCOUNTER — Ambulatory Visit: Payer: HMO | Attending: Internal Medicine

## 2019-09-30 DIAGNOSIS — Z23 Encounter for immunization: Secondary | ICD-10-CM | POA: Insufficient documentation

## 2019-09-30 NOTE — Progress Notes (Signed)
   Covid-19 Vaccination Clinic  Name:  AKRAM KISSICK    MRN: [id number] DOB: 1945-11-27  09/30/2019  Mr. Thilges was observed post Covid-19 immunization for 15 minutes without incidence. He was provided with Vaccine Information Sheet and instruction to access the V-Safe system.   Mr. Gladson was instructed to call 911 with any severe reactions post vaccine: Marland Kitchen Difficulty breathing  . Swelling of your face and throat  . A fast heartbeat  . A bad rash all over your body  . Dizziness and weakness    Immunizations Administered    Name Date Dose VIS Date Route   Pfizer COVID-19 Vaccine 09/30/2019 12:04 PM 0.3 mL 07/20/2019 Intramuscular   Manufacturer: [employer]   Lot: [id number]   NDC: [address]-4

## 2019-10-02 ENCOUNTER — Other Ambulatory Visit: Payer: Self-pay | Admitting: Family

## 2019-10-02 MED FILL — ATORVASTATIN 40 MG TABLET: 40 | 30 days supply | Qty: 30 | Fill #0

## 2019-10-02 MED FILL — METOPROLOL SUCCINATE ER 25: 25 | 30 days supply | Qty: 30 | Fill #0

## 2019-10-02 NOTE — Patient Instructions (Addendum)
Visit Information  Goals Addressed            This Visit's Progress   . COMPLETED: " I'm having trouble determining if my insurance will cover the Dexcom sensor" (pt-stated)       -Inquiring from Dexcom rep if adhesive in Dexcom is the same as adhesive in [employer] (still awaiting response) -Spoke to [employer] with [employer] who will assist in getting Dexcom ordered for patient (awaiting fax to continue process).   Securely emailed the Chancellor regarding the Dexcom G6 sensor benefit coverage. Advised client that per the concierge, the sensor will require prior approval from the provider and if approved, client will be accountable for 20% of the total cost of the sensor    . "Maintain my  A1C under 7.0%" (pt-stated)   On track    BG averages per visit on 09/25/19 are at goal. All averages are <150.  Reviewed targets for Hgb A1C and pre and post meal blood sugars Assessed medication taking behavior Reviewed sliding scale per Dr. Cruzita Lederer Reviewed frequency of hypoglycemia Hgb A1C was 6.8% on 01/12/19    . Obtain annual screen for micro albuminuria (urine) , nephropathy (kidney problems)   Not on track    Reinforced need for MA/CR and pt to obtain at next office visit or at appt with Dr. Renne Crigler  Most recent urine for protein was completed on 01/11/18 , advised client via telephone and care plan  and provider via care plan     . Obtain Hemoglobin A1C at least 2 times per year   Not on track    Advised pt on 09/25/19 CCM telephone visit to get a1c drawn at next office visit or appt with Dr. Renne Crigler    . Visit Primary Care Provider or Endocrinologist at least 2 times per year    On track    Pt to schedule appt with Endocrinologist   Client completed visit with primary care provider either via in person or via telehealth on 1/31, 7/29, 8/28, and 07/03/19 Client completed visit with endocrinologist either via in person visit or via telehealth on 6/5/and  07/13/19    . Weight (lb) < 175 lb (79.4 kg)       Weight on 09/25/19 was 172 lbs       Ryan [name] was given information about Chronic Care Management services today including:  1. CCM service includes personalized support from designated clinical staff supervised by his physician, including individualized plan of care and coordination with other care providers 2. 24/7 contact phone numbers for assistance for urgent and routine care needs. 3. Service will only be billed when office clinical staff spend 20 minutes or more in a month to coordinate care. 4. Only one practitioner may furnish and bill the service in a calendar month. 5. The patient may stop CCM services at any time (effective at the end of the month) by phone call to the office staff. 6. The patient will be responsible for cost sharing (co-pay) of up to 20% of the service fee (after annual deductible is met).  Patient agreed to services and verbal consent obtained.   The patient verbalized understanding of instructions provided today and agreed to receive a mailed copy of patient instruction and/or educational materials. The pharmacy team will reach out to the patient again over the next 30 days.    De Blanch, PharmD Clinical Pharmacist [facility] [phone]   Gout  Gout  is painful swelling of your joints. Gout is a type of arthritis. It is caused by having too much uric acid in your body. Uric acid is a chemical that is made when your body breaks down substances called purines. If your body has too much uric acid, sharp crystals can form and build up in your joints. This causes pain and swelling. Gout attacks can happen quickly and be very painful (acute gout). Over time, the attacks can affect more joints and happen more often (chronic gout). What are the causes?  Too much uric acid in your blood. This can happen because: ? Your kidneys do not remove enough uric acid from your  blood. ? Your body makes too much uric acid. ? You eat too many foods that are high in purines. These foods include organ meats, some seafood, and beer.  Trauma or stress. What increases the risk?  Having a family history of gout.  Being male and middle-aged.  Being male and having gone through menopause.  Being very overweight (obese).  Drinking alcohol, especially beer.  Not having enough water in the body (being dehydrated).  Losing weight too quickly.  Having an organ transplant.  Having lead poisoning.  Taking certain medicines.  Having kidney disease.  Having a skin condition called psoriasis. What are the signs or symptoms? An attack of acute gout usually happens in just one joint. The most common place is the big toe. Attacks often start at night. Other joints that may be affected include joints of the feet, ankle, knee, fingers, wrist, or elbow. Symptoms of an attack may include:  Very bad pain.  Warmth.  Swelling.  Stiffness.  Shiny, red, or purple skin.  Tenderness. The affected joint may be very painful to touch.  Chills and fever. Chronic gout may cause symptoms more often. More joints may be involved. You may also have white or yellow lumps (tophi) on your hands or feet or in other areas near your joints. How is this treated?  Treatment for this condition has two phases: treating an acute attack and preventing future attacks.  Acute gout treatment may include: ? NSAIDs. ? Steroids. These are taken by mouth or injected into a joint. ? Colchicine. This medicine relieves pain and swelling. It can be given by mouth or through an IV tube.  Preventive treatment may include: ? Taking small doses of NSAIDs or colchicine daily. ? Using a medicine that reduces uric acid levels in your blood. ? Making changes to your diet. You may need to see a food expert (dietitian) about what to eat and drink to prevent gout. Follow these instructions at  home: During a gout attack   If told, put ice on the painful area: ? Put ice in a plastic bag. ? Place a towel between your skin and the bag. ? Leave the ice on for 20 minutes, 2-3 times a Ryan [name].  Raise (elevate) the painful joint above the level of your heart as often as you can.  Rest the joint as much as possible. If the joint is in your leg, you may be given crutches.  Follow instructions from your doctor about what you cannot eat or drink. Avoiding future gout attacks  Eat a low-purine diet. Avoid foods and drinks such as: ? Liver. ? Kidney. ? Anchovies. ? Asparagus. ? Herring. ? Mushrooms. ? Mussels. ? Beer.  Stay at a healthy weight. If you want to lose weight, talk with your doctor. Do not lose weight too fast.  Start or continue an exercise plan as told by your doctor. Eating and drinking  Drink enough fluids to keep your pee (urine) pale yellow.  If you drink alcohol: ? Limit how much you use to:  0-1 drink a Seline Enzor for women.  0-2 drinks a Naimah Yingst for men. ? Be aware of how much alcohol is in your drink. In the [place] one drink equals one 12 oz bottle of beer (355 mL), one 5 oz glass of wine (148 mL), or one 1 oz glass of hard liquor (44 mL). General instructions  Take over-the-counter and prescription medicines only as told by your doctor.  Do not drive or use heavy machinery while taking prescription pain medicine.  Return to your normal activities as told by your doctor. Ask your doctor what activities are safe for you.  Keep all follow-up visits as told by your doctor. This is important. Contact a doctor if:  You have another gout attack.  You still have symptoms of a gout attack after 10 days of treatment.  You have problems (side effects) because of your medicines.  You have chills or a fever.  You have burning pain when you pee (urinate).  You have pain in your lower back or belly. Get help right away if:  You have very bad pain.  Your pain  cannot be controlled.  You cannot pee. Summary  Gout is painful swelling of the joints.  The most common site of pain is the big toe, but it can affect other joints.  Medicines and avoiding some foods can help to prevent and treat gout attacks. This information is not intended to replace advice given to you by your health care provider. Make sure you discuss any questions you have with your health care provider. Document Revised: 02/15/2018 Document Reviewed: 02/15/2018 Elsevier Patient Education  [place].

## 2019-10-04 ENCOUNTER — Telehealth: Payer: Self-pay | Admitting: Family

## 2019-10-04 DIAGNOSIS — E1165 Type 2 diabetes mellitus with hyperglycemia: Secondary | ICD-10-CM

## 2019-10-04 DIAGNOSIS — E1159 Type 2 diabetes mellitus with other circulatory complications: Secondary | ICD-10-CM

## 2019-10-04 NOTE — Telephone Encounter (Signed)
Please contact pt to schedule lab appointment.

## 2019-10-06 ENCOUNTER — Telehealth: Payer: Self-pay | Admitting: Pharmacist

## 2019-10-06 NOTE — Telephone Encounter (Signed)
Patient was supposed to receive a Dexcom CGM around December, but had difficulties with coordination. I got in contact with Virl Son at [facility] who has been very helpful in helping coordinate care for this patient. She sent me the certificate of medical necessity and requested a copy of the patient's most recent office visit. I completed the form and placed it for signature by [name]. I also printed a copy of the most recent office visit with me on 09/25/2019. I will fax this back to [facility] after obtaining signature from [place].   This coordination of care took 30 minutes.

## 2019-10-09 ENCOUNTER — Other Ambulatory Visit (INDEPENDENT_AMBULATORY_CARE_PROVIDER_SITE_OTHER): Payer: HMO

## 2019-10-09 ENCOUNTER — Other Ambulatory Visit: Payer: Self-pay

## 2019-10-09 DIAGNOSIS — E1165 Type 2 diabetes mellitus with hyperglycemia: Secondary | ICD-10-CM | POA: Diagnosis not present

## 2019-10-09 DIAGNOSIS — E1159 Type 2 diabetes mellitus with other circulatory complications: Secondary | ICD-10-CM | POA: Diagnosis not present

## 2019-10-09 LAB — BASIC METABOLIC PANEL
BUN: 23 mg/dL (ref 6–23)
CO2: 29 mEq/L (ref 19–32)
Calcium: 9.5 mg/dL (ref 8.4–10.5)
Chloride: 99 mEq/L (ref 96–112)
Creatinine, Ser: 1.07 mg/dL (ref 0.40–1.50)
GFR: 67.67 mL/min (ref >60.00)
Glucose, Bld: 228 mg/dL — ABNORMAL HIGH (ref 70–99)
Potassium: 3.8 mEq/L (ref 3.5–5.1)
Sodium: 135 mEq/L (ref 135–145)

## 2019-10-09 LAB — HEMOGLOBIN A1C: Hgb A1c MFr Bld: 7.2 % — ABNORMAL HIGH (ref 4.6–6.5)

## 2019-10-09 LAB — MICROALBUMIN / CREATININE URINE RATIO
Creatinine,U: 92.8 mg/dL
Microalb Creat Ratio: 1 mg/g (ref 0.0–30.0)
Microalb, Ur: 0.9 mg/dL (ref 0.0–1.9)

## 2019-10-11 MED FILL — LISINOPRIL 5 MG TABS: 5 | 30 days supply | Qty: 30 | Fill #1

## 2019-10-11 MED FILL — ELIQUIS 5 MG TABLET: 5 | 30 days supply | Qty: 60 | Fill #3

## 2019-10-24 ENCOUNTER — Ambulatory Visit: Payer: HMO | Attending: Internal Medicine

## 2019-10-24 DIAGNOSIS — Z23 Encounter for immunization: Secondary | ICD-10-CM

## 2019-10-24 NOTE — Progress Notes (Signed)
   Covid-19 Vaccination Clinic  Name:  KAELYN NAUTA    MRN: [id number] DOB: 1946/06/29  10/24/2019  Mr. Deleo was observed post Covid-19 immunization for 15 minutes without incident. He was provided with Vaccine Information Sheet and instruction to access the V-Safe system.   Mr. Coonradt was instructed to call 911 with any severe reactions post vaccine: Marland Kitchen Difficulty breathing  . Swelling of face and throat  . A fast heartbeat  . A bad rash all over body  . Dizziness and weakness   Immunizations Administered    Name Date Dose VIS Date Route   Pfizer COVID-19 Vaccine 10/24/2019 10:37 AM 0.3 mL 07/20/2019 Intramuscular   Manufacturer: [employer], [employer]   Lot: [id number]   NDC: [address]-5

## 2019-10-30 MED FILL — ATORVASTATIN 40 MG TABLET: 40 | 30 days supply | Qty: 30 | Fill #1

## 2019-10-30 MED FILL — METOPROLOL SUCCINATE ER 25: 25 | 30 days supply | Qty: 30 | Fill #1

## 2019-11-01 ENCOUNTER — Other Ambulatory Visit: Payer: Self-pay

## 2019-11-05 ENCOUNTER — Ambulatory Visit (INDEPENDENT_AMBULATORY_CARE_PROVIDER_SITE_OTHER): Payer: HMO | Admitting: Internal Medicine

## 2019-11-05 ENCOUNTER — Other Ambulatory Visit: Payer: Self-pay

## 2019-11-05 ENCOUNTER — Encounter: Payer: Self-pay | Admitting: Internal Medicine

## 2019-11-05 VITALS — BP 120/70 | HR 62 | Ht 69.0 in | Wt 176.0 lb

## 2019-11-05 DIAGNOSIS — E1165 Type 2 diabetes mellitus with hyperglycemia: Secondary | ICD-10-CM | POA: Diagnosis not present

## 2019-11-05 DIAGNOSIS — E663 Overweight: Secondary | ICD-10-CM

## 2019-11-05 DIAGNOSIS — E785 Hyperlipidemia, unspecified: Secondary | ICD-10-CM

## 2019-11-05 DIAGNOSIS — E1159 Type 2 diabetes mellitus with other circulatory complications: Secondary | ICD-10-CM | POA: Diagnosis not present
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
## 2019-11-05 NOTE — Patient Instructions (Signed)
Please continue: - Levemir 30 units at bedtime - Novolog mealtime 5-8 units 3x a day before meals (if you are active after a meal, take no more than 5-6 units before that meal) - Novolog sliding scale as follows:  - 150-175: + 1 unit  - 176-200: + 2 units  - 201-225: + 3 units  - 226-250: + 4 units  - >250: + 5 units  Please return in 3-4 months with your sugar log.

## 2019-11-12 ENCOUNTER — Other Ambulatory Visit: Payer: Self-pay | Admitting: Family

## 2019-11-12 DIAGNOSIS — I10 Essential (primary) hypertension: Secondary | ICD-10-CM

## 2019-11-12 MED FILL — LISINOPRIL 5 MG TABLET: 5 | 30 days supply | Qty: 30 | Fill #0

## 2019-11-12 MED FILL — ELIQUIS 5 MG TABLET: 5 | 30 days supply | Qty: 60 | Fill #4

## 2019-11-14 ENCOUNTER — Ambulatory Visit: Payer: Self-pay | Admitting: Pharmacist

## 2019-11-14 DIAGNOSIS — E119 Type 2 diabetes mellitus without complications: Secondary | ICD-10-CM

## 2019-11-14 DIAGNOSIS — E785 Hyperlipidemia, unspecified: Secondary | ICD-10-CM

## 2019-11-14 DIAGNOSIS — Z794 Long term (current) use of insulin: Secondary | ICD-10-CM
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
## 2019-11-29 ENCOUNTER — Other Ambulatory Visit: Payer: Self-pay | Admitting: Pharmacist

## 2019-11-29 NOTE — Patient Outreach (Signed)
[facility] ([facility])  [facility] Quality Pharmacy Team    [facility] pharmacy case will be closed as our team is transitioning from the [facility] Care Management Department into the [facility] Quality Department and will no longer be using [facility] for documentation purposes.      Nagi Furio J. Phi Avans, PharmD, BCACP [facility] Clinical Pharmacist [phone]   

## 2019-12-03 MED FILL — METOPROLOL SUCCINATE ER 25: 25 | 30 days supply | Qty: 30 | Fill #2

## 2019-12-03 MED FILL — ATORVASTATIN 40 MG TABLET: 40 | 30 days supply | Qty: 30 | Fill #2

## 2019-12-05 ENCOUNTER — Ambulatory Visit: Payer: Self-pay | Admitting: Pharmacist

## 2019-12-10 ENCOUNTER — Telehealth: Payer: Self-pay

## 2019-12-10 NOTE — Telephone Encounter (Signed)
Forms for Novo Nordiak PAP refills of Levemir and Novolog filled out, signed by Dr. Elvera Lennox and faxed to [employer] with confirmation.

## 2019-12-11 MED FILL — ELIQUIS 5 MG TABLET: 5 | 30 days supply | Qty: 60 | Fill #5

## 2019-12-11 MED FILL — LISINOPRIL 5 MG TABLET: 5 | 30 days supply | Qty: 30 | Fill #1

## 2019-12-28 NOTE — Telephone Encounter (Signed)
Novofine plus 32 G Novolog flex pen levemir flex touch  PAP received and in fridge. Pt is aware and told he needed to get it today.

## 2019-12-31 ENCOUNTER — Other Ambulatory Visit: Payer: Self-pay | Admitting: Family

## 2019-12-31 MED FILL — ATORVASTATIN CALCIUM 40 MG: 40 | 30 days supply | Qty: 30 | Fill #0

## 2019-12-31 MED FILL — METOPROLOL SUCCINATE ER 25: 25 | 30 days supply | Qty: 30 | Fill #0

## 2020-01-01 ENCOUNTER — Ambulatory Visit (INDEPENDENT_AMBULATORY_CARE_PROVIDER_SITE_OTHER): Payer: HMO | Admitting: Family

## 2020-01-01 ENCOUNTER — Other Ambulatory Visit: Payer: Self-pay

## 2020-01-01 ENCOUNTER — Encounter: Payer: Self-pay | Admitting: Family

## 2020-01-01 VITALS — BP 150/75 | HR 62 | Temp 97.3°F | Resp 16 | Wt 169.0 lb

## 2020-01-01 DIAGNOSIS — E785 Hyperlipidemia, unspecified: Secondary | ICD-10-CM

## 2020-01-01 DIAGNOSIS — E1165 Type 2 diabetes mellitus with hyperglycemia: Secondary | ICD-10-CM | POA: Diagnosis not present

## 2020-01-01 DIAGNOSIS — E1159 Type 2 diabetes mellitus with other circulatory complications: Secondary | ICD-10-CM | POA: Diagnosis not present

## 2020-01-01 DIAGNOSIS — I10 Essential (primary) hypertension: Secondary | ICD-10-CM

## 2020-01-01 DIAGNOSIS — E118 Type 2 diabetes mellitus with unspecified complications: Secondary | ICD-10-CM | POA: Diagnosis not present

## 2020-01-01 LAB — BASIC METABOLIC PANEL
BUN: 15 mg/dL (ref 6–23)
CO2: 30 mEq/L (ref 19–32)
Calcium: 9.5 mg/dL (ref 8.4–10.5)
Chloride: 103 mEq/L (ref 96–112)
Creatinine, Ser: 1.07 mg/dL (ref 0.40–1.50)
GFR: 67.62 mL/min (ref >60.00)
Glucose, Bld: 128 mg/dL — ABNORMAL HIGH (ref 70–99)
Potassium: 4.7 mEq/L (ref 3.5–5.1)
Sodium: 138 mEq/L (ref 135–145)

## 2020-01-01 LAB — LIPID PANEL
Cholesterol: 119 mg/dL (ref 0–200)
HDL: 42.4 mg/dL (ref >39.00)
LDL Cholesterol: 61 mg/dL (ref 0–99)
NonHDL: 76.26
Total CHOL/HDL Ratio: 3
Triglycerides: 77 mg/dL (ref 0.0–149.0)
VLDL: 15.4 mg/dL (ref 0.0–40.0)

## 2020-01-01 MED ORDER — CEPHALEXIN 500 MG PO CAPS: 500.0000 mg | ORAL_CAPSULE | Freq: Three times a day (TID) | ORAL | 0 refills | Status: DC

## 2020-01-01 MED FILL — CEPHALEXIN 500 MG CAPSULE: 500 | 7 days supply | Qty: 21 | Fill #0

## 2020-01-01 NOTE — Progress Notes (Signed)
Subjective:    Patient ID: Ryan [name], male    DOB: 01/07/1946, 74 y.o.   MRN: [id number]  HPI  Patient is a 74 yr old male who presents today for follow up.  DM2- currently being managed by endo. Eye exam is due.  Lab Results  Component Value Date   HGBA1C 7.2 (H) 10/09/2019   HGBA1C 6.8 (A) 01/12/2019   HGBA1C 6.2 (A) 07/13/2018   Lab Results  Component Value Date   MICROALBUR 0.9 10/09/2019   LDLCALC 74 04/06/2019   CREATININE 1.07 10/09/2019   HTN- on lisinopril 5mg , toprol xl 25mg .  Reports sbp <135/76  BP Readings from Last 3 Encounters:  01/01/20 (!) 150/75  11/05/19 120/70  07/03/19 138/74   Hyperlipidemia- miantained on lipitor 40mg .   Review of Systems See HPI  Past Medical History:  Diagnosis Date  . Allergy   . Diabetes mellitus   . GERD (gastroesophageal reflux disease)   . History of chicken pox   . Hyperlipidemia   . Hypertension   . Myocardial infarction (HCC) 12-21-2015  . Type 2 diabetes mellitus with hyperglycemia, with long-term current use of insulin (HCC) 09/08/2015     Social History   Socioeconomic History  . Marital status: Widowed    Spouse name: Not on file  . Number of children: 1  . Years of education: Not on file  . Highest education level: Not on file  Occupational History  . Occupation: retired    Comment: from  Tobacco Use  . Smoking status: Former Smoker    Types: Cigarettes    Quit date: 09/01/1965    Years since quitting: 54.3  . Smokeless tobacco: Never Used  Substance and Sexual Activity  . Alcohol use: No  . Drug use: No  . Sexual activity: Never  Other Topics Concern  . Not on file  Social History Narrative   Regular exercise:  5 x weekly   Caffeine Use: 1 cups coffee daily.   Retired from 09/10/2015.   Son, daughter in law and young adult granddaughter and grandson live with client   Wife died from ovarian cancer in 21-Dec-2014         Social Determinants of Health   Financial Resource Strain:  Low Risk   . Difficulty of Paying Living Expenses: Not hard at all  Food Insecurity: No Food Insecurity  . Worried About 09/03/1965 in the Last Year: Never true  . Ran Out of Food in the Last Year: Never true  Transportation Needs: No Transportation Needs  . Lack of Transportation (Medical): No  . Lack of Transportation (Non-Medical): No  Physical Activity: Sufficiently Active  . Days of Exercise per Week: 7 days  . Minutes of Exercise per Session: 30 min  Stress: No Stress Concern Present  . Feeling of Stress : Only a little  Social Connections: Unknown  . Frequency of Communication with Friends and Family: More than three times a week  . Frequency of Social Gatherings with Friends and Family: More than three times a week  . Attends Religious Services: Never  . Active Member of Clubs or Organizations: Not on file  . Attends Building control surveyor Meetings: Not on file  . Marital Status: Widowed  Intimate Partner Violence: Not At Risk  . Fear of Current or Ex-Partner: No  . Emotionally Abused: No  . Physically Abused: No  . Sexually Abused: No    Past Surgical History:  Procedure Laterality Date  .  EYE SURGERY  1998   bilateral  . SHOULDER SURGERY  1990   right shoulder, torn rotator cuff.    Family History  Problem Relation Age of Onset  . Cancer Mother        cancer  . Heart disease Mother   . Hyperlipidemia Mother   . Hypertension Mother   . Hyperlipidemia Father   . Stroke Father   . Hypertension Father   . Diabetes Paternal Grandfather     Allergies  Allergen Reactions  . Other Other (See Comments)    Client states he became very sick and had significant skin inflammation and irritation with the Freestyle Libre sensor    Current Outpatient Medications on File Prior to Visit  Medication Sig Dispense Refill  . aspirin 81 MG tablet Take 81 mg by mouth daily.      Marland Kitchen atorvastatin (LIPITOR) 40 MG tablet TAKE 1 TABLET BY MOUTH ONCE DAILY **NEED FOLLOW UP  FOR FURTHER REFILLS** 30 tablet 2  . Calcium Carbonate-Vitamin D (CALTRATE 600+D) 600-400 MG-UNIT per tablet Take 1 tablet by mouth 2 (two) times daily. Reported on 01/27/2016    . colchicine 0.6 MG tablet Take 1 tablet (0.6 mg total) by mouth 2 (two) times daily. 60 tablet 2  . Continuous Blood Gluc Transmit (DEXCOM G6 TRANSMITTER) MISC 1 Device by Does not apply route every 3 (three) months. 1 each 3  . ELIQUIS 5 MG TABS tablet TAKE 1 TABLET (5 MG TOTAL) BY MOUTH 2 (TWO) TIMES DAILY. 60 tablet 6  . glucagon (GLUCAGON EMERGENCY) 1 MG injection Inject 1 mg into the muscle once as needed for up to 1 dose. 1 each 12  . Glucagon 3 MG/DOSE POWD Place 3 mg into the nose once as needed for up to 1 dose. 1 each 11  . insulin aspart (NOVOLOG) 100 UNIT/ML injection INJECT 5-8 UNITS INTO THE SKIN 3 TIMES A DAY WITH MEALS 30 mL 5  . insulin detemir (LEVEMIR) 100 UNIT/ML injection INJECT 0.30 ML (30 UNITS) INTO THE SKIN AT BEDTIME 30 mL 5  . lisinopril (ZESTRIL) 5 MG tablet TAKE 1 TABLET BY MOUTH ONCE DAILY 30 tablet 1  . metoprolol succinate (TOPROL-XL) 25 MG 24 hr tablet TAKE 1 TABLET BY MOUTH ONCE DAILY **NEEDS FOLLOW UP FOR FURTHER REFILLS** 30 tablet 2  . TRUEPLUS INSULIN SYRINGE 31G X 5/16" 0.5 ML MISC USE TO INJECT INSULIN 4 TIMES A DAY 300 each 3   No current facility-administered medications on file prior to visit.    BP (!) 150/75   Pulse 62   Temp (!) 97.3 F (36.3 C) (Temporal)   Resp 16   Wt 169 lb (76.7 kg)   SpO2 100%   BMI 24.96 kg/m       Objective:   Physical Exam Constitutional:      General: He is not in acute distress.    Appearance: He is well-developed.  HENT:     Head: Normocephalic and atraumatic.  Cardiovascular:     Rate and Rhythm: Normal rate and regular rhythm.     Heart sounds: No murmur.  Pulmonary:     Effort: Pulmonary effort is normal. No respiratory distress.     Breath sounds: Normal breath sounds. No wheezing or rales.  Skin:    General: Skin is warm  and dry.  Neurological:     Mental Status: He is alert and oriented to person, place, and time.  Psychiatric:        Behavior: Behavior  normal.        Thought Content: Thought content normal.           Assessment & Plan:  DM2- overall stable- management per endo.  Hyperlipidemia- tolerating statin, obtain lipid panel.   HTN- bp was a bit elevated today. Home readings have been much better. I advised the patient to continue to monitor at home and call if he starts seeing readings >140/90. Continue current meds.  This visit occurred during the SARS-CoV-2 public health emergency.  Safety protocols were in place, including screening questions prior to the visit, additional usage of staff PPE, and extensive cleaning of exam room while observing appropriate contact time as indicated for disinfecting solutions.

## 2020-01-01 NOTE — Patient Instructions (Signed)
Please complete lab work prior to leaving.   

## 2020-01-09 ENCOUNTER — Other Ambulatory Visit: Payer: Self-pay | Admitting: Internal Medicine

## 2020-01-09 ENCOUNTER — Other Ambulatory Visit: Payer: Self-pay | Admitting: Family

## 2020-01-09 DIAGNOSIS — I10 Essential (primary) hypertension: Secondary | ICD-10-CM

## 2020-01-09 MED FILL — LISINOPRIL 5 MG TABLET: 5 | 30 days supply | Qty: 30 | Fill #0

## 2020-01-09 MED FILL — GLUCAGON 1 MG EMERGENCY KIT: 1 | 1 days supply | Qty: 1 | Fill #1

## 2020-01-09 NOTE — Telephone Encounter (Signed)
Pt last saw Berton Bon, NP on 11/02/18 telemedicine Covid-19, pt is overdue for follow-up. Last labs 01/01/20 Creat 1.07, age 74, weight 76.7kg, based on specified criteria pt is on appropriate dosage of Eliquis 5mg  BID.   Will send note to schedulers to call pt to schedule f/u with Dr . Will await appt to refill rx.

## 2020-01-10 MED FILL — ELIQUIS 5 MG TABLET: 5 | 30 days supply | Qty: 60 | Fill #0

## 2020-01-10 NOTE — Telephone Encounter (Signed)
Eliquis 5mg  refill request received. Patient is 74 years old, weight-76.7kg, Crea-1.07 on 01/01/2020, Diagnosis-Aflutter, and last seen by 01/03/2020 on 11/01/48 via telemedicine and the pt received a call from the schedulers and have an appt set for 02/08/2020 with Dr. 04/10/2020. Dose is appropriate based on dosing criteria. Will send in refill to requested pharmacy as pt is pending the upcoming appt.

## 2020-01-21 ENCOUNTER — Ambulatory Visit: Payer: Self-pay

## 2020-01-21 ENCOUNTER — Other Ambulatory Visit: Payer: Self-pay

## 2020-01-21 NOTE — Patient Outreach (Signed)
[facility]) Care Management Chronic Special Needs Program  01/21/2020  Name: Ryan [name] DOB: [birth year]  MRN: [id number]  [name] is enrolled in a chronic special needs plan for Diabetes.Telephone call to client for CSNP assessment follow up. HPAA verified. Client reports having recent follow up appointment with his primary care provider. Client reports having a scheduled appointment with his ophthalmologist on 6/ 17/21. Client reports taking his medications as prescribed. Client reports having a follow up appointment with his cardiologist on 02/08/20 and follow up with his endocrinologist on 02/19/20.  Client reports he is doing well. He states his most recent A1c is 7.2 and he continues to work to bring it under 7.0.  Client reports his blood sugars range from 70 to 130.      Goals Addressed              This Visit's Progress   .  "I may want to update my living will"   On track     Client reports receiving packet but has not completed.  Client reports his son will assist him when needed.  Contact your RN case manager if you have questions regarding the completion of your Advanced Directive.  Goal renewed 2021    .  "Maintain my  A1C under 7.0%" (pt-stated)   On track     Client reports most recent Hgb A1c is 7.2 Client reports fasting blood sugar ranges 70 to 130 Continue to take your diabetic medication as advised.  Continue home exercise as tolerated and as advised by your doctor.  Continue to adhere to diabetic diet.  Goal Renewed    .  HEMOGLOBIN A1C < 7        Discussed Target Hgb A1c with client Reviewed diabetes self management actions:  Glucose monitoring per provider recommendation  Check feet daily  Visit provider every 3-6 months as directed  Hbg A1C level every 3-6 months.  Eye Exam yearly  Carbohydrate controlled meal planning  Taking diabetes medication as prescribed by provider  Physical activity  Goal renewed 2021    .   Increase water intake   On track     Set daily goals for increasing your water intake Keep a reusable water bottle with you Set reminders  Replace other drinks with water Flavor your water with lemons, limes, or fruit Goal renewed 2021    .  Maintain timely refills of diabetic medication as prescribed within the year .   On track     Chart review indicates client maintains timely refills of his medications. Contact your RN case manager if you have difficulty obtaining your medications Contact your doctor if you have questions or concerns.  Goal renewed 2021    .  Obtain Annual Eye (retinal)  Exam    On track     Diabetic eye exam completed 09/20/18,  Client reports having a scheduled appointment for his eye exam on 01/24/20. Goal renewed 2021     .  COMPLETED: Obtain annual screen for micro albuminuria (urine) , nephropathy (kidney problems)        Recent Microabuminuria completed 10/09/19    .  Obtain Hemoglobin A1C at least 2 times per year   On track     10/09/19: a1c - 7.2% Continue to maintain yearly physicals, follow up visits and labs with your doctor Goal renewed 2021    .  Visit Primary Care Provider or Endocrinologist at least 2 times per year  On track     Primary care provider visits 10/09/19, 01/01/20 Endocrinology visit  07/13/19, 11/05/19 Continue to maintain yearly physicals and follow up visits with your provider.  Goal renewed 2021      ASSESSMENT;   Client has received medication assistance with [facility] pharmacy for his Novolog and Levemir. Client appears to be managing his health conditions overall and reports having good family support.  Client's C-SNP tier level will be changed to Tier 1.    Plan:   RN  Case manager will send client and primary care provider updated individualized care plan RNCM will follow up with client in 12 months.   George Ina RN,BSN,CCM Chronic Care Management Coordinator [facility] Care  Management [phone]      Otho Ket  [facility] Case Manager, C-SNP   .

## 2020-01-24 DIAGNOSIS — Z961 Presence of intraocular lens: Secondary | ICD-10-CM | POA: Diagnosis not present

## 2020-01-24 DIAGNOSIS — H35372 Puckering of macula, left eye: Secondary | ICD-10-CM | POA: Diagnosis not present

## 2020-01-24 DIAGNOSIS — E119 Type 2 diabetes mellitus without complications: Secondary | ICD-10-CM | POA: Diagnosis not present

## 2020-01-24 LAB — HM DIABETES EYE EXAM

## 2020-01-25 NOTE — Progress Notes (Addendum)
I connected with [name] today by telephone and verified that I am speaking with the correct person using two identifiers. Location patient: home Location provider: work Persons participating in the virtual visit: patient, Engineer, civil (consulting).    I discussed the limitations, risks, security and privacy concerns of performing an evaluation and management service by telephone and the availability of in person appointments. I also discussed with the patient that there may be a patient responsible charge related to this service. The patient expressed understanding and verbally consented to this telephonic visit.    Interactive audio and video telecommunications were attempted between this provider and patient, however failed, due to patient having technical difficulties OR patient did not have access to video capability.  We continued and completed visit with audio only.  Some vital signs may be absent or patient reported.    Subjective:   Ryan [name] is a 74 y.o. male who presents for Medicare Annual/Subsequent preventive examination.  Lives in 1 story home. Son's familiy lives w/ him  Review of Systems:   Cardiac Risk Factors include: advanced age (>41men, >7 women);diabetes mellitus;dyslipidemia;hypertension;male gender     Objective:    Vitals: BP 133/79 Comment: pt reported vitals  Pulse (!) 56   Temp 98.6 F (37 C) (Temporal)   Wt 169 lb (76.7 kg)   BMI 24.96 kg/m   Body mass index is 24.96 kg/m.  Advanced Directives 01/28/2020 07/30/2019 03/06/2019 01/25/2019 11/29/2017 11/22/2016 01/27/2016  Does Patient Have a Medical Advance Directive? No Yes Yes Yes No No No  Type of Advance Directive - Healthcare Power of [place];Living will Healthcare Power of [place];Living will Healthcare Power of [place];Living will - - -  Does patient want to make changes to medical advance directive? - Yes (MAU/Ambulatory/Procedural Areas - Information given) No - Patient declined No - Patient declined - Yes  (MAU/Ambulatory/Procedural Areas - Information given) -  Copy of Healthcare Power of Attorney in Chart? - No - copy requested - Yes - validated most recent copy scanned in chart (See row information) - - -  Would patient like information on creating a medical advance directive? No - Patient declined - - - No - Patient declined - No - patient declined information    Tobacco Social History   Tobacco Use  Smoking Status Former Smoker  . Types: Cigarettes  . Quit date: 09/01/1965  . Years since quitting: 54.4  Smokeless Tobacco Never Used     Counseling given: Not Answered   Clinical Intake:     Pain : No/denies pain           Nutrition Risk Assessment:  Has the patient had any N/V/D within the last 2 months?  No  Does the patient have any non-healing wounds?  No  Has the patient had any unintentional weight loss or weight gain?  No   Diabetes:  Is the patient diabetic?  Yes  If diabetic, was a CBG obtained today?  Yes  147 @home  per pt. Did the patient bring in their glucometer from home?  No  How often do you monitor your CBG's? daily.   Financial Strains and Diabetes Management:  Are you having any financial strains with the device, your supplies or your medication? No .  Does the patient want to be seen by Chronic Care Management for management of their diabetes?  No  Would the patient like to be referred to a Nutritionist or for Diabetic Management?  No   Diabetic Exams:  Diabetic Eye Exam:  Completed 01/24/20 w/ Dr.Groat.  Diabetic Foot Exam: Completed 01/01/20 per pt w/ PCP.         Past Medical History:  Diagnosis Date  . Allergy   . Diabetes mellitus   . GERD (gastroesophageal reflux disease)   . History of chicken pox   . Hyperlipidemia   . Hypertension   . Myocardial infarction (HCC) 2015-12-04  . Type 2 diabetes mellitus with hyperglycemia, with long-term current use of insulin (HCC) 09/08/2015   Past Surgical History:  Procedure Laterality Date  .  EYE SURGERY  1998   bilateral  . SHOULDER SURGERY  1990   right shoulder, torn rotator cuff.   Family History  Problem Relation Age of Onset  . Cancer Mother        cancer  . Heart disease Mother   . Hyperlipidemia Mother   . Hypertension Mother   . Hyperlipidemia Father   . Stroke Father   . Hypertension Father   . Diabetes Paternal Grandfather    Social History   Socioeconomic History  . Marital status: Widowed    Spouse name: Not on file  . Number of children: 1  . Years of education: Not on file  . Highest education level: Not on file  Occupational History  . Occupation: retired    Comment: from Engineering geologist  Tobacco Use  . Smoking status: Former Smoker    Types: Cigarettes    Quit date: 09/01/1965    Years since quitting: 54.4  . Smokeless tobacco: Never Used  Vaping Use  . Vaping Use: Never used  Substance and Sexual Activity  . Alcohol use: No  . Drug use: No  . Sexual activity: Never  Other Topics Concern  . Not on file  Social History Narrative   Regular exercise:  5 x weekly   Caffeine Use: 1 cups coffee daily.   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   Son, daughter in law and young adult granddaughter and grandson live with client   Wife died from ovarian cancer in 2014/12/04         Social Determinants of Health   Financial Resource Strain: Low Risk   . Difficulty of Paying Living Expenses: Not hard at all  Food Insecurity: No Food Insecurity  . Worried About Programme researcher, broadcasting/film/video in the Last Year: Never true  . Ran Out of Food in the Last Year: Never true  Transportation Needs: No Transportation Needs  . Lack of Transportation (Medical): No  . Lack of Transportation (Non-Medical): No  Physical Activity: Sufficiently Active  . Days of Exercise per Week: 7 days  . Minutes of Exercise per Session: 30 min  Stress: No Stress Concern Present  . Feeling of Stress : Only a little  Social Connections: Unknown  . Frequency of Communication with Friends and Family:  More than three times a week  . Frequency of Social Gatherings with Friends and Family: More than three times a week  . Attends Religious Services: Never  . Active Member of Clubs or Organizations: Not on file  . Attends Banker Meetings: Not on file  . Marital Status: Widowed    Outpatient Encounter Medications as of 01/28/2020  Medication Sig  . aspirin 81 MG tablet Take 81 mg by mouth daily.    Marland Kitchen atorvastatin (LIPITOR) 40 MG tablet TAKE 1 TABLET BY MOUTH ONCE DAILY **NEED FOLLOW UP FOR FURTHER REFILLS**  . Calcium Carbonate-Vitamin D (CALTRATE 600+D) 600-400 MG-UNIT per tablet Take 1 tablet by mouth  2 (two) times daily. Reported on 01/27/2016  . cholecalciferol (VITAMIN D3) 25 MCG (1000 UNIT) tablet Take 1,000 Units by mouth daily.  . colchicine 0.6 MG tablet Take 1 tablet (0.6 mg total) by mouth 2 (two) times daily.  . Continuous Blood Gluc Transmit (DEXCOM G6 TRANSMITTER) MISC 1 Device by Does not apply route every 3 (three) months.  Marland Kitchen ELIQUIS 5 MG TABS tablet TAKE 1 TABLET (5 MG TOTAL) BY MOUTH 2 (TWO) TIMES DAILY.  Marland Kitchen glucagon (GLUCAGON EMERGENCY) 1 MG injection Inject 1 mg into the muscle once as needed for up to 1 dose.  Marland Kitchen Glucagon 3 MG/DOSE POWD Place 3 mg into the nose once as needed for up to 1 dose.  . insulin aspart (NOVOLOG) 100 UNIT/ML injection INJECT 5-8 UNITS INTO THE SKIN 3 TIMES A DAY WITH MEALS  . insulin detemir (LEVEMIR) 100 UNIT/ML injection INJECT 0.30 ML (30 UNITS) INTO THE SKIN AT BEDTIME  . lisinopril (ZESTRIL) 5 MG tablet TAKE 1 TABLET BY MOUTH ONCE DAILY  . metoprolol succinate (TOPROL-XL) 25 MG 24 hr tablet TAKE 1 TABLET BY MOUTH ONCE DAILY **NEEDS FOLLOW UP FOR FURTHER REFILLS**  . TRUEPLUS INSULIN SYRINGE 31G X 5/16" 0.5 ML MISC USE TO INJECT INSULIN 4 TIMES A DAY  . [DISCONTINUED] cephALEXin (KEFLEX) 500 MG capsule Take 1 capsule (500 mg total) by mouth 3 (three) times daily. (Patient not taking: Reported on 01/21/2020)   No facility-administered  encounter medications on file as of 01/28/2020.    Activities of Daily Living In your present state of health, do you have any difficulty performing the following activities: 01/28/2020 01/21/2020  Hearing? N N  Vision? N N  Difficulty concentrating or making decisions? N N  Walking or climbing stairs? N N  Dressing or bathing? N N  Doing errands, shopping? Y N  Comment family drives him -  Quarry manager and eating ? N N  Using the Toilet? N N  In the past six months, have you accidently leaked urine? N N  Do you have problems with loss of bowel control? N N  Managing your Medications? N N  Managing your Finances? N N  Housekeeping or managing your Housekeeping? N N  Some recent data might be hidden    Patient Care Team: Sandford Craze, NP as PCP - General (Internal Medicine) Pricilla Riffle, MD as PCP - Cardiology (Cardiology) Carlus Pavlov, MD as Consulting Physician (Endocrinology) Pricilla Riffle, MD as Consulting Physician (Cardiology) Day, Dannielle Karvonen, [facility] as Pharmacist (Pharmacist) Otho Ket, RN as [facility] Care Management   Assessment:   This is a routine wellness examination for Ryan [name].  Exercise Activities and Dietary recommendations Current Exercise Habits: Home exercise routine, Type of exercise: walking, Time (Minutes): 30, Frequency (Times/Week): 7, Weekly Exercise (Minutes/Week): 210, Intensity: Mild, Exercise limited by: None identified Diet (meal preparation, eat out, water intake, caffeinated beverages, dairy products, fruits and vegetables): well balanced   Goals Addressed            This Visit's Progress   . Complete Advance Directives         Fall Risk Fall Risk  01/28/2020 01/25/2019 11/29/2017 11/22/2016 02/04/2015  Falls in the past year? 0 0 No No No  Number falls in past yr: 0 - - - -  Injury with Fall? 0 - - - -  Follow up Education provided;Falls prevention discussed - - - -   FALL RISK PREVENTION PERTAINING TO THE HOME:   Lives in  1 story. Son and his family live w/ him.   Any stairs in or around the home? Yes  If so, are there any without handrails? No   Home free of loose throw rugs in walkways, pet beds, electrical cords, etc? Yes  Adequate lighting in your home to reduce risk of falls? Yes   ASSISTIVE DEVICES UTILIZED TO PREVENT FALLS:  Life alert? No  Use of a cane, walker or w/c? No  Grab bars in the bathroom? Yes  Shower chair or bench in shower? No  Elevated toilet seat or a handicapped toilet? No    Depression Screen PHQ 2/9 Scores 01/28/2020 01/21/2020 03/06/2019 01/25/2019  PHQ - 2 Score 0 0 0 0    Cognitive Function Ad8 score reviewed for issues:  Issues making decisions:no  Less interest in hobbies / activities:no  Repeats questions, stories (family complaining):no  Trouble using ordinary gadgets (microwave, computer, phone):no  Forgets the month or year: no  Mismanaging finances: no  Remembering appts:no Daily problems with thinking and/or memory:no Ad8 score is=0     MMSE - Mini Mental State Exam 11/29/2017 11/22/2016  Orientation to time 5 5  Orientation to Place 5 5  Registration 3 3  Attention/ Calculation 5 5  Recall 3 2  Language- name 2 objects 2 2  Language- repeat 1 1  Language- follow 3 step command 3 3  Language- read & follow direction 1 1  Write a sentence 1 1  Copy design 1 0  Total score 30 28        Immunization History  Administered Date(s) Administered  . Fluad Quad(high Dose 65+) 05/15/2019  . H1N1 11/19/2008  . Influenza Split 05/25/2011, 04/20/2012  . Influenza Whole 06/06/2009  . Influenza, High Dose Seasonal PF 04/20/2016, 05/12/2017, 05/26/2018  . Influenza,inj,Quad PF,6+ Mos 05/07/2013, 05/27/2014, 05/13/2015  . PFIZER SARS-COV-2 Vaccination 09/30/2019, 10/24/2019  . Pneumococcal Conjugate-13 10/19/2004, 05/11/2013  . Pneumococcal Polysaccharide-23 05/25/2011, 09/08/2018  . Td 08/09/2001, 08/25/2011  . Zoster 08/25/2011     Tdap: done 08/25/11  Flu Vaccine: done 05/15/19  Pneumococcal Vaccine: last done 09/08/18  Covid-19 Vaccine: Completed vaccines  Screening Tests Health Maintenance  Topic Date Due  . INFLUENZA VACCINE  03/09/2020  . FOOT EXAM  04/05/2020  . HEMOGLOBIN A1C  04/10/2020  . OPHTHALMOLOGY EXAM  01/23/2021  . TETANUS/TDAP  08/24/2021  . Fecal DNA (Cologuard)  09/27/2021  . COVID-19 Vaccine  Completed  . Hepatitis C Screening  Completed  . PNA vac Low Risk Adult  Completed   Cancer Screenings:  Colorectal Screening: Completed Cologuard 09/27/18. Negative. Due every 75yrs.  Lung Cancer Screening: (Low Dose CT Chest recommended if Age 51-80 years, 30 pack-year currently smoking OR have quit w/in 15years.) does not qualify.    Additional Screening:  Hepatitis C Screening:  Completed 05/26/11  Dental Screening: Recommended annual dental exams for proper oral hygiene  Community Resource Referral:  CRR required this visit?  No        Plan:  Please schedule your next medicare wellness visit with me in 1 yr.  Continue to eat heart healthy diet (full of fruits, vegetables, whole grains, lean protein, water--limit salt, fat, and sugar intake) and increase physical activity as tolerated.  Continue doing brain stimulating activities (puzzles, reading, adult coloring books, staying active) to keep memory sharp.     I have personally reviewed and addressed the Medicare Annual Wellness questionnaire and have noted the following in the patient's chart:  A. Medical and social history  B. Use of alcohol, tobacco or illicit drugs  C. Current medications and supplements D. Functional ability and status E.  Nutritional status F.  Physical activity G. Advance directives H. List of other physicians I.  Hospitalizations, surgeries, and ER visits in previous 12 months J.  Vitals K. Screenings such as hearing and vision if needed, cognitive and depression L. Referrals and appointments   In  addition, I have reviewed and discussed with patient certain preventive protocols, quality metrics, and best practice recommendations. A written personalized care plan for preventive services as well as general preventive health recommendations were provided to patient.  Due to this being a telephonic visit, the after visit summary with patients personalized plan was offered to patient via mail or my-chart.  Patient would like to access on my-chart   Signed,   [place]on [place], [place]  01/28/2020 Nurse Health Advisor  Nurse Notes:

## 2020-01-28 ENCOUNTER — Ambulatory Visit (INDEPENDENT_AMBULATORY_CARE_PROVIDER_SITE_OTHER): Payer: HMO | Admitting: *Deleted

## 2020-01-28 ENCOUNTER — Encounter: Payer: Self-pay | Admitting: *Deleted

## 2020-01-28 ENCOUNTER — Ambulatory Visit: Payer: Self-pay

## 2020-01-28 VITALS — BP 133/79 | HR 56 | Temp 98.6°F | Wt 169.0 lb

## 2020-01-28 DIAGNOSIS — Z0000 Encounter for general adult medical examination without abnormal findings: Secondary | ICD-10-CM | POA: Diagnosis not present

## 2020-01-28 NOTE — Patient Instructions (Signed)
Please schedule your next medicare wellness visit with me in 1 yr.  Continue to eat heart healthy diet (full of fruits, vegetables, whole grains, lean protein, water--limit salt, fat, and sugar intake) and increase physical activity as tolerated.  Continue doing brain stimulating activities (puzzles, reading, adult coloring books, staying active) to keep memory sharp.    Ryan [name] , Thank you for taking time to come for your Medicare Wellness Visit. I appreciate your ongoing commitment to your health goals. Please review the following plan we discussed and let me know if I can assist you in the future.   These are the goals we discussed: Goals    .  "I may want to update my living will"      Client reports receiving packet but has not completed.  Client reports his son will assist him when needed.  Contact your RN case manager if you have questions regarding the completion of your Advanced Directive.  Goal renewed 2021    .  "Maintain my  A1C under 7.0%" (pt-stated)      Client reports most recent Hgb A1c is 7.2 Client reports fasting blood sugar ranges 70 to 130 Continue to take your diabetic medication as advised.  Continue home exercise as tolerated and as advised by your doctor.  Continue to adhere to diabetic diet.  Goal Renewed    .  Complete Advance Directives    .  HEMOGLOBIN A1C < 7      Discussed Target Hgb A1c with client Reviewed diabetes self management actions:  Glucose monitoring per provider recommendation  Check feet daily  Visit provider every 3-6 months as directed  Hbg A1C level every 3-6 months.  Eye Exam yearly  Carbohydrate controlled meal planning  Taking diabetes medication as prescribed by provider  Physical activity  Goal renewed 2021    .  Increase water intake      Set daily goals for increasing your water intake Keep a reusable water bottle with you Set reminders  Replace other drinks with water Flavor your water with lemons, limes,  or fruit Goal renewed 2021    .  Maintain timely refills of diabetic medication as prescribed within the year .      Chart review indicates client maintains timely refills of his medications. Contact your RN case manager if you have difficulty obtaining your medications Contact your doctor if you have questions or concerns.  Goal renewed 2021    .  Obtain Annual Eye (retinal)  Exam       Diabetic eye exam completed 09/20/18,  Client reports having a scheduled appointment for his eye exam on 01/24/20. Goal renewed 2021     .  Obtain Annual Foot Exam      Diabetic foot exam completed on 04/06/19 Diabetes foot care - Check feet daily at home (look for skin color changes, cuts, sores or cracks in the skin, swelling of feet or ankles, ingrown or fungal toenails, corn or calluses). Report these findings to your doctor - Wash feet with soap and water, dry feet well especially between toes - Moisturize your feet but not between the toes - Always wear shoes that protect your whole feet.  Goal renewed 2021    .  Obtain Hemoglobin A1C at least 2 times per year      10/09/19: a1c - 7.2% Continue to maintain yearly physicals, follow up visits and labs with your doctor Goal renewed 2021    .  Visit Primary Care Provider  or Endocrinologist at least 2 times per year       Primary care provider visits 10/09/19, 01/01/20 Endocrinology visit  07/13/19, 11/05/19 Continue to maintain yearly physicals and follow up visits with your provider.  Goal renewed 2021    .  Weight (lb) < 175 lb (79.4 kg)      Weight on 09/25/19 was 172 lbs       This is a list of the screening recommended for you and due dates:  Health Maintenance  Topic Date Due  . Flu Shot  03/09/2020  . Complete foot exam   04/05/2020  . Hemoglobin A1C  04/10/2020  . Eye exam for diabetics  01/23/2021  . Tetanus Vaccine  08/24/2021  . Cologuard (Stool DNA test)  09/27/2021  . COVID-19 Vaccine  Completed  .  Hepatitis C: One time screening  is recommended by Center for Disease Control  (CDC) for  adults born from [birth year] through 1965.   Completed  . Pneumonia vaccines  Completed    Preventive Care 90 Years and Older, Male Preventive care refers to lifestyle choices and visits with your health care provider that can promote health and wellness. This includes:  A yearly physical exam. This is also called an annual well check.  Regular dental and eye exams.  Immunizations.  Screening for certain conditions.  Healthy lifestyle choices, such as diet and exercise. What can I expect for my preventive care visit? Physical exam Your health care provider will check:  Height and weight. These may be used to calculate body mass index (BMI), which is a measurement that tells if you are at a healthy weight.  Heart rate and blood pressure.  Your skin for abnormal spots. Counseling Your health care provider may ask you questions about:  Alcohol, tobacco, and drug use.  Emotional well-being.  Home and relationship well-being.  Sexual activity.  Eating habits.  History of falls.  Memory and ability to understand (cognition).  Work and work Statistician. What immunizations do I need?  Influenza (flu) vaccine  This is recommended every year. Tetanus, diphtheria, and pertussis (Tdap) vaccine  You may need a Td booster every 10 years. Varicella (chickenpox) vaccine  You may need this vaccine if you have not already been vaccinated. Zoster (shingles) vaccine  You may need this after age 67. Pneumococcal conjugate (PCV13) vaccine  One dose is recommended after age 58. Pneumococcal polysaccharide (PPSV23) vaccine  One dose is recommended after age 43. Measles, mumps, and rubella (MMR) vaccine  You may need at least one dose of MMR if you were born in 1957 or later. You may also need a second dose. Meningococcal conjugate (MenACWY) vaccine  You may need this if you have certain conditions. Hepatitis A  vaccine  You may need this if you have certain conditions or if you travel or work in places where you may be exposed to hepatitis A. Hepatitis B vaccine  You may need this if you have certain conditions or if you travel or work in places where you may be exposed to hepatitis B. Haemophilus influenzae type b (Hib) vaccine  You may need this if you have certain conditions. You may receive vaccines as individual doses or as more than one vaccine together in one shot (combination vaccines). Talk with your health care provider about the risks and benefits of combination vaccines. What tests do I need? Blood tests  Lipid and cholesterol levels. These may be checked every 5 years, or more frequently depending on  your overall health.  Hepatitis C test.  Hepatitis B test. Screening  Lung cancer screening. You may have this screening every year starting at age 37 if you have a 30-pack-year history of smoking and currently smoke or have quit within the past 15 years.  Colorectal cancer screening. All adults should have this screening starting at age 45 and continuing until age 65. Your health care provider may recommend screening at age 33 if you are at increased risk. You will have tests every 1-10 years, depending on your results and the type of screening test.  Prostate cancer screening. Recommendations will vary depending on your family history and other risks.  Diabetes screening. This is done by checking your blood sugar (glucose) after you have not eaten for a while (fasting). You may have this done every 1-3 years.  Abdominal aortic aneurysm (AAA) screening. You may need this if you are a current or former smoker.  Sexually transmitted disease (STD) testing. Follow these instructions at home: Eating and drinking  Eat a diet that includes fresh fruits and vegetables, whole grains, lean protein, and low-fat dairy products. Limit your intake of foods with high amounts of sugar, saturated  fats, and salt.  Take vitamin and mineral supplements as recommended by your health care provider.  Do not drink alcohol if your health care provider tells you not to drink.  If you drink alcohol: ? Limit how much you have to 0-2 drinks a day. ? Be aware of how much alcohol is in your drink. In the [place] one drink equals one 12 oz bottle of beer (355 mL), one 5 oz glass of wine (148 mL), or one 1 oz glass of hard liquor (44 mL). Lifestyle  Take daily care of your teeth and gums.  Stay active. Exercise for at least 30 minutes on 5 or more days each week.  Do not use any products that contain nicotine or tobacco, such as cigarettes, e-cigarettes, and chewing tobacco. If you need help quitting, ask your health care provider.  If you are sexually active, practice safe sex. Use a condom or other form of protection to prevent STIs (sexually transmitted infections).  Talk with your health care provider about taking a low-dose aspirin or statin. What's next?  Visit your health care provider once a year for a well check visit.  Ask your health care provider how often you should have your eyes and teeth checked.  Stay up to date on all vaccines. This information is not intended to replace advice given to you by your health care provider. Make sure you discuss any questions you have with your health care provider. Document Revised: 07/20/2018 Document Reviewed: 07/20/2018 Elsevier Patient Education  2020 [employer].

## 2020-02-04 MED FILL — ATORVASTATIN CALCIUM 40 MG: 40 | 30 days supply | Qty: 30 | Fill #1

## 2020-02-04 MED FILL — ELIQUIS 5 MG TABLET: 5 | 30 days supply | Qty: 60 | Fill #1

## 2020-02-04 MED FILL — METOPROLOL SUCCINATE ER 25: 25 | 30 days supply | Qty: 30 | Fill #1

## 2020-02-04 MED FILL — LISINOPRIL 5 MG TABLET: 5 | 30 days supply | Qty: 30 | Fill #1

## 2020-02-07 NOTE — Progress Notes (Signed)
Cardiology Office Note   Date:  02/09/2020   ID:  [name], DOB May 06, 1946, MRN [id number]  PCP:  Sandford Craze, NP  Cardiologist:   Dietrich Pates, MD   F/U of atrial flutter and HTN      History of Present Illness: Ryan [name] is a 74 y.o. male with a history of atrial flutter  Placed on Eliquis in 2017Hx of  Lexiscan in Aug 2017 was normal  Echo  In 2017 with normal LVEF  I saw the pt in 2019   He has since been seen by Lonell Face in March 2021  Since seen the pt denies symptoms of palpitations.   Breathing is OK   He remains active      Outpatient Medications Prior to Visit  Medication Sig Dispense Refill  . atorvastatin (LIPITOR) 40 MG tablet TAKE 1 TABLET BY MOUTH ONCE DAILY **NEED FOLLOW UP FOR FURTHER REFILLS** 30 tablet 2  . Calcium Carbonate-Vitamin D (CALTRATE 600+D) 600-400 MG-UNIT per tablet Take 1 tablet by mouth 2 (two) times daily. Reported on 01/27/2016    . cholecalciferol (VITAMIN D3) 25 MCG (1000 UNIT) tablet Take 1,000 Units by mouth daily.    . colchicine 0.6 MG tablet Take 1 tablet (0.6 mg total) by mouth 2 (two) times daily. 60 tablet 2  . Continuous Blood Gluc Transmit (DEXCOM G6 TRANSMITTER) MISC 1 Device by Does not apply route every 3 (three) months. 1 each 3  . ELIQUIS 5 MG TABS tablet TAKE 1 TABLET (5 MG TOTAL) BY MOUTH 2 (TWO) TIMES DAILY. 60 tablet 1  . glucagon (GLUCAGON EMERGENCY) 1 MG injection Inject 1 mg into the muscle once as needed for up to 1 dose. 1 each 12  . Glucagon 3 MG/DOSE POWD Place 3 mg into the nose once as needed for up to 1 dose. 1 each 11  . insulin aspart (NOVOLOG) 100 UNIT/ML injection INJECT 5-8 UNITS INTO THE SKIN 3 TIMES A DAY WITH MEALS 30 mL 5  . insulin detemir (LEVEMIR) 100 UNIT/ML injection INJECT 0.30 ML (30 UNITS) INTO THE SKIN AT BEDTIME 30 mL 5  . lisinopril (ZESTRIL) 5 MG tablet TAKE 1 TABLET BY MOUTH ONCE DAILY 30 tablet 1  . metoprolol succinate (TOPROL-XL) 25 MG 24 hr tablet TAKE 1 TABLET BY MOUTH ONCE  DAILY **NEEDS FOLLOW UP FOR FURTHER REFILLS** 30 tablet 2  . TRUEPLUS INSULIN SYRINGE 31G X 5/16" 0.5 ML MISC USE TO INJECT INSULIN 4 TIMES A DAY 300 each 3  . aspirin 81 MG tablet Take 81 mg by mouth daily.       No facility-administered medications prior to visit.     Allergies:   Other   Past Medical History:  Diagnosis Date  . Allergy   . Diabetes mellitus   . GERD (gastroesophageal reflux disease)   . History of chicken pox   . Hyperlipidemia   . Hypertension   . Myocardial infarction (HCC) 2017  . Type 2 diabetes mellitus with hyperglycemia, with long-term current use of insulin (HCC) 09/08/2015    Past Surgical History:  Procedure Laterality Date  . EYE SURGERY  1998   bilateral  . SHOULDER SURGERY  1990   right shoulder, torn rotator cuff.     Social History:  The patient  reports that he quit smoking about 54 years ago. His smoking use included cigarettes. He has never used smokeless tobacco. He reports that he does not drink alcohol and does not use drugs.  Family History:  The patient's family history includes Cancer in his mother; Diabetes in his paternal grandfather; Heart disease in his mother; Hyperlipidemia in his father and mother; Hypertension in his father and mother; Stroke in his father.    ROS:  Please see the history of present illness. All other systems are reviewed and  Negative to the above problem except as noted.    PHYSICAL EXAM: VS:  BP 130/80   Pulse 60   Ht 5' 9.75" (1.772 m)   Wt 171 lb 3.2 oz (77.7 kg)   SpO2 98%   BMI 24.74 kg/m   GEN: Well nourished, well developed, in no acute distress  HEENT: normal  Neck: JVP normal  No carotid bruits, Cardiac: RRR; no murmurs, rubs, or gallops,no edema  Respiratory:  clear to auscultation bilaterally, normal work of breathing GI: soft, nontender, nondistended, + BS  No hepatomegaly  MS: no deformity Moving all extremities   Skin: warm and dry, no rash Neuro:  Strength and sensation are  intact Psych: euthymic mood, full affect   EKG:  EKG is t ordered today.   SR 60 bpm    Lipid Panel    Component Value Date/Time   CHOL 119 01/01/2020 1126   TRIG 77.0 01/01/2020 1126   HDL 42.40 01/01/2020 1126   CHOLHDL 3 01/01/2020 1126   VLDL 15.4 01/01/2020 1126   LDLCALC 61 01/01/2020 1126      Wt Readings from Last 3 Encounters:  02/08/20 171 lb 3.2 oz (77.7 kg)  01/28/20 169 lb (76.7 kg)  01/01/20 169 lb (76.7 kg)      ASSESSMENT AND PLAN: 1  Paroxysmal atrial flutter  Pt denies symtpoms (was very symptomatic when had in past)    I would keep on anticoag   He can stop aspirin  2  HTN Great control  BP is contrlled   3  HL  Would continue lipitor  Last lipids LDL 61  HDL 43     Current medicines are reviewed at length with the patient today.  The patient does not have concerns regarding medicines.  Signed, Dietrich Pates, MD  02/09/2020 12:56 AM    [facility] [address] [place], [place], [place]  [address] Phone: [phone]; Fax: [phone]

## 2020-02-08 ENCOUNTER — Ambulatory Visit: Payer: HMO | Admitting: Internal Medicine

## 2020-02-08 ENCOUNTER — Encounter: Payer: Self-pay | Admitting: Internal Medicine

## 2020-02-08 ENCOUNTER — Other Ambulatory Visit: Payer: Self-pay

## 2020-02-08 VITALS — BP 130/80 | HR 60 | Ht 69.75 in | Wt 171.2 lb

## 2020-02-08 DIAGNOSIS — I10 Essential (primary) hypertension: Secondary | ICD-10-CM | POA: Diagnosis not present

## 2020-02-08 NOTE — Patient Instructions (Signed)
Medication Instructions:  Please discontinue your Aspirin.  Continue all other medications as listed.  *If you need a refill on your cardiac medications before your next appointment, please call your pharmacy*  Follow-Up: At [facility], you and your health needs are our priority.  As part of our continuing mission to provide you with exceptional heart care, we have created designated Provider Care Teams.  These Care Teams include your primary Cardiologist (physician) and Advanced Practice Providers (APPs -  Physician Assistants and Nurse Practitioners) who all work together to provide you with the care you need, when you need it.  We recommend signing up for the patient portal called "MyChart".  Sign up information is provided on this After Visit Summary.  MyChart is used to connect with patients for Virtual Visits (Telemedicine).  Patients are able to view lab/test results, encounter notes, upcoming appointments, etc.  Non-urgent messages can be sent to your provider as well.   To learn more about what you can do with MyChart, go to [email or web address].    Your next appointment:   9 month(s)  The format for your next appointment:   In Person  Provider:   Dietrich Pates, MD   Thank you for choosing [facility]!!

## 2020-02-19 ENCOUNTER — Other Ambulatory Visit: Payer: Self-pay

## 2020-02-19 ENCOUNTER — Ambulatory Visit (INDEPENDENT_AMBULATORY_CARE_PROVIDER_SITE_OTHER): Payer: HMO | Admitting: Internal Medicine

## 2020-02-19 ENCOUNTER — Encounter: Payer: Self-pay | Admitting: Internal Medicine

## 2020-02-19 VITALS — BP 120/88 | HR 63 | Ht 69.75 in | Wt 172.0 lb

## 2020-02-19 DIAGNOSIS — E1159 Type 2 diabetes mellitus with other circulatory complications: Secondary | ICD-10-CM

## 2020-02-19 DIAGNOSIS — E1165 Type 2 diabetes mellitus with hyperglycemia: Secondary | ICD-10-CM

## 2020-02-19 DIAGNOSIS — E663 Overweight: Secondary | ICD-10-CM | POA: Diagnosis not present

## 2020-02-19 LAB — POCT GLYCOSYLATED HEMOGLOBIN (HGB A1C): Hemoglobin A1C: 6.7 % — AB (ref 4.0–5.6)

## 2020-02-19 MED ORDER — INSULIN DETEMIR 100 UNIT/ML ~~LOC~~ SOLN: SUBCUTANEOUS | 5 refills | Status: DC

## 2020-02-19 NOTE — Addendum Note (Signed)
Addended by: Darliss Ridgel I on: 02/19/2020 03:05 PM   Modules accepted: Orders
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
## 2020-02-19 NOTE — Patient Instructions (Signed)
Please decrease: - Levemir 25 units at bedtime - Novolog mealtime 5-7 units 3x a day before meals (if you are active after a meal, take no more than 5 units before that meal)  Continue: - Novolog sliding scale as follows:  - 150-175: + 1 unit  - 176-200: + 2 units  - 201-225: + 3 units  - 226-250: + 4 units  - >250: + 5 units  Please return in 4 months with your sugar log.

## 2020-03-03 ENCOUNTER — Other Ambulatory Visit: Payer: Self-pay | Admitting: Internal Medicine

## 2020-03-03 ENCOUNTER — Other Ambulatory Visit: Payer: Self-pay | Admitting: Family

## 2020-03-03 DIAGNOSIS — I10 Essential (primary) hypertension: Secondary | ICD-10-CM

## 2020-03-03 MED FILL — METOPROLOL SUCCINATE ER 25: 25 | 30 days supply | Qty: 30 | Fill #2

## 2020-03-03 MED FILL — GLUCAGON 1 MG EMERGENCY KIT: 1 | 1 days supply | Qty: 1 | Fill #2

## 2020-03-03 MED FILL — ATORVASTATIN CALCIUM 40 MG: 40 | 30 days supply | Qty: 30 | Fill #2

## 2020-03-03 MED FILL — LISINOPRIL 5 MG TABLET: 5 | 30 days supply | Qty: 30 | Fill #0

## 2020-03-03 MED FILL — ELIQUIS 5 MG TABLET: 5 | 30 days supply | Qty: 60 | Fill #0

## 2020-03-03 NOTE — Telephone Encounter (Signed)
Eliquis 5mg  refill request received. Patient is 74 years old, weight-78kg, Crea-1.07 on 01/01/2020, Diagnosis-Aflutter, and last seen by Dr. 01/03/2020 on 02/08/2020. Dose is appropriate based on dosing criteria. Will send in refill to requested pharmacy.

## 2020-03-25 ENCOUNTER — Ambulatory Visit: Payer: HMO | Admitting: Pharmacist

## 2020-03-25 DIAGNOSIS — I10 Essential (primary) hypertension: Secondary | ICD-10-CM

## 2020-03-25 DIAGNOSIS — E785 Hyperlipidemia, unspecified: Secondary | ICD-10-CM

## 2020-03-25 DIAGNOSIS — E118 Type 2 diabetes mellitus with unspecified complications: Secondary | ICD-10-CM

## 2020-03-25 NOTE — Patient Instructions (Signed)
Visit Information  Goals Addressed            This Visit's Progress    Chronic Care Management Pharmacy Care Plan       CARE PLAN ENTRY (see longitudinal plan of care for additional care plan information)  Current Barriers:   Chronic Disease Management support, education, and care coordination needs related to Afib/flutter, DM, HTN, HLD, GERD, Osteopenia, Gout   Hypertension BP Readings from Last 3 Encounters:  02/19/20 120/88  02/08/20 130/80  01/28/20 133/79    Pharmacist Clinical Goal(s): o Over the next 90 days, patient will work with PharmD and providers to maintain BP goal <140/90  Current regimen:   Lisinopril 5mg  daily  Metoprolol succinate 25mg  daily  Patient self care activities - Over the next 90 days, patient will: o Check BP daily, document, and provide at future appointments o Ensure daily salt intake < 2300 mg/Selby Foisy  Hyperlipidemia Lab Results  Component Value Date/Time   LDLCALC 61 01/01/2020 11:26 AM    Pharmacist Clinical Goal(s): o Over the next 90 days, patient will work with PharmD and providers to maintain LDL goal < 70  Current regimen:  o Atorvastatin 40mg  daily  Patient self care activities - Over the next 90 days, patient will: o Maintain cholesterol medication regimen.   Diabetes Lab Results  Component Value Date/Time   HGBA1C 6.7 (A) 02/19/2020 03:05 PM   HGBA1C 7.2 (H) 10/09/2019 01:15 PM   HGBA1C 6.8 (A) 01/12/2019 10:17 AM   HGBA1C 7.9 (H) 01/27/2016 03:18 PM    Pharmacist Clinical Goal(s): o Over the next 90 days, patient will work with PharmD and providers to maintain A1c goal <7%  Current regimen:   Novolog 5-8 units TID w/ meals  Levemir 25 units HS  Interventions: o Attempted to get patient set up with Dexcom, but patient concerned with possible allergic reaction  Patient self care activities - Over the next 90 days, patient will: o Check blood sugar 3-4 times daily, document, and provide at future  appointments o Contact provider with any episodes of hypoglycemia  Health Maintenance   Pharmacist Clinical Goal(s) o Over the next 90 days, patient will work with PharmD and providers to complete health maintenance screenings/vaccinations  Interventions: o Discussed completing Shingrix vaccine series  Patient self care activities - Over the next 90 days, patient will: o Complete Shingrix vaccine series    Medication management  Pharmacist Clinical Goal(s): o Over the next 90 days, patient will work with PharmD and providers to maintain optimal medication adherence  Current pharmacy: 12/09/2019 Outpatient Pharmacy  Interventions o Comprehensive medication review performed. o Continue current medication management strategy  Patient self care activities - Over the next 90 days, patient will: o Focus on medication adherence by filling and taking medications appropriately  o Take medications as prescribed o Report any questions or concerns to PharmD and/or provider(s)  Please see past updates related to this goal by clicking on the "Past Updates" button in the selected goal         The patient verbalized understanding of instructions provided today and agreed to receive a mailed copy of patient instruction and/or educational materials.  Telephone follow up appointment with pharmacy team member scheduled for: 09/25/2020  01/29/2016, PharmD Clinical Pharmacist [facility] [phone]

## 2020-03-25 NOTE — Chronic Care Management (AMB) (Signed)
Chronic Care Management Pharmacy  Name: Ryan [name]  MRN: [id number] DOB: 1945/08/14  Chief Complaint/ HPI  Ryan [name],  74 y.o. , male presents for their Follow-Up CCM visit with the clinical pharmacist via telephone.  PCP : Ryan Craze, NP  Their chronic conditions include: Afib/flutter, DM, HTN, HLD, GERD, Osteopenia, Gout  Office Visits: 01/28/20: Medicare Annual Wellness Exam w/ Ryan Haagensen, RN - goal addressed to complete advance directives.  01/01/20: Visit w/ Ryan Craze, NP - No med changes noted. Monitor BP at home.  Consult Visits: 02/19/20: Endo visit w/ Dr. Elvera [name] - Decrease Levemir to 25 units HS, Novolog 5-7 units TID Novolog Sliding Scale 150-175 - 1 unit 176-200 - 2 units 201-225 - 3 units 226-250 - 4 units >250 - 5 units  02/08/20: Cardio visit w/ Dr. Tenny [name] - D/C aspirin  Medications: Outpatient Encounter Medications as of 03/25/2020  Medication Sig  . apixaban (ELIQUIS) 5 MG TABS tablet Take 1 tablet (5 mg total) by mouth 2 (two) times daily.  Marland Kitchen atorvastatin (LIPITOR) 40 MG tablet TAKE 1 TABLET BY MOUTH ONCE DAILY **NEED FOLLOW UP FOR FURTHER REFILLS**  . Calcium Carbonate-Vitamin D (CALTRATE 600+D) 600-400 MG-UNIT per tablet Take 1 tablet by mouth 2 (two) times daily. Reported on 01/27/2016  . cholecalciferol (VITAMIN D3) 25 MCG (1000 UNIT) tablet Take 1,000 Units by mouth daily.  . colchicine 0.6 MG tablet Take 1 tablet (0.6 mg total) by mouth 2 (two) times daily.  Marland Kitchen glucagon (GLUCAGON EMERGENCY) 1 MG injection Inject 1 mg into the muscle once as needed for up to 1 dose.  . insulin aspart (NOVOLOG) 100 UNIT/ML injection INJECT 5-8 UNITS INTO THE SKIN 3 TIMES A Ryan [name] WITH MEALS  . insulin detemir (LEVEMIR) 100 UNIT/ML injection INJECT 0.25 ML (25 UNITS) INTO THE SKIN AT BEDTIME  . lisinopril (ZESTRIL) 5 MG tablet TAKE 1 TABLET BY MOUTH ONCE DAILY  . metoprolol succinate (TOPROL-XL) 25 MG 24 hr tablet TAKE 1 TABLET BY MOUTH ONCE DAILY  **NEEDS FOLLOW UP FOR FURTHER REFILLS**  . TRUEPLUS INSULIN SYRINGE 31G X 5/16" 0.5 ML MISC USE TO INJECT INSULIN 4 TIMES A Ryan [name]   No facility-administered encounter medications on file as of 03/25/2020.   SDOH Screenings   Alcohol Screen:   . Last Alcohol Screening Score (AUDIT):   Depression (PHQ2-9): Low Risk   . PHQ-2 Score: 0  Financial Resource Strain: Low Risk   . Difficulty of Paying Living Expenses: Not hard at all  Food Insecurity: No Food Insecurity  . Worried About Programme researcher, broadcasting/film/video in the Last Year: Never true  . Ran Out of Food in the Last Year: Never true  Housing: Low Risk   . Last Housing Risk Score: 0  Physical Activity: Sufficiently Active  . Days of Exercise per Week: 7 days  . Minutes of Exercise per Session: 30 min  Social Connections: Unknown  . Frequency of Communication with Friends and Family: More than three times a week  . Frequency of Social Gatherings with Friends and Family: More than three times a week  . Attends Religious Services: Never  . Active Member of Clubs or Organizations: Not on file  . Attends Banker Meetings: Not on file  . Marital Status: Widowed  Stress: No Stress Concern Present  . Feeling of Stress : Only a little  Tobacco Use: Medium Risk  . Smoking Tobacco Use: Former Smoker  . Smokeless Tobacco Use: Never Used  Transportation Needs: No  Transportation Needs  . Lack of Transportation (Medical): No  . Lack of Transportation (Non-Medical): No     Current Diagnosis/Assessment:  Goals Addressed            This Visit's Progress   . Chronic Care Management Pharmacy Care Plan       CARE PLAN ENTRY (see longitudinal plan of care for additional care plan information)  Current Barriers:  . Chronic Disease Management support, education, and care coordination needs related to Afib/flutter, DM, HTN, HLD, GERD, Osteopenia, Gout   Hypertension BP Readings from Last 3 Encounters:  02/19/20 120/88  02/08/20 130/80    01/28/20 133/79   . Pharmacist Clinical Goal(s): o Over the next 90 days, patient will work with PharmD and providers to maintain BP goal <140/90 . Current regimen:   Lisinopril 5mg  daily  Metoprolol succinate 25mg  daily . Patient self care activities - Over the next 90 days, patient will: o Check BP daily, document, and provide at future appointments o Ensure daily salt intake < 2300 mg/Ryan [name]  Hyperlipidemia Lab Results  Component Value Date/Time   LDLCALC 61 01/01/2020 11:26 AM   . Pharmacist Clinical Goal(s): o Over the next 90 days, patient will work with PharmD and providers to maintain LDL goal < 70 . Current regimen:  o Atorvastatin 40mg  daily . Patient self care activities - Over the next 90 days, patient will: o Maintain cholesterol medication regimen.   Diabetes Lab Results  Component Value Date/Time   HGBA1C 6.7 (A) 02/19/2020 03:05 PM   HGBA1C 7.2 (H) 10/09/2019 01:15 PM   HGBA1C 6.8 (A) 01/12/2019 10:17 AM   HGBA1C 7.9 (H) 01/27/2016 03:18 PM   . Pharmacist Clinical Goal(s): o Over the next 90 days, patient will work with PharmD and providers to maintain A1c goal <7% . Current regimen:   Novolog 5-8 units TID w/ meals  Levemir 25 units HS . Interventions: o Attempted to get patient set up with Dexcom, but patient concerned with possible allergic reaction . Patient self care activities - Over the next 90 days, patient will: o Check blood sugar 3-4 times daily, document, and provide at future appointments o Contact provider with any episodes of hypoglycemia  Health Maintenance  . Pharmacist Clinical Goal(s) o Over the next 90 days, patient will work with PharmD and providers to complete health maintenance screenings/vaccinations . Interventions: o Discussed completing Shingrix vaccine series . Patient self care activities - Over the next 90 days, patient will: o Complete Shingrix vaccine series    Medication management . Pharmacist Clinical  Goal(s): o Over the next 90 days, patient will work with PharmD and providers to maintain optimal medication adherence . Current pharmacy: 12/09/[facility] Outpatient [facility] . Interventions o Comprehensive medication review performed. o Continue current medication management strategy . Patient self care activities - Over the next 90 days, patient will: o Focus on medication adherence by filling and taking medications appropriately  o Take medications as prescribed o Report any questions or concerns to PharmD and/or provider(s)  Please see past updates related to this goal by clicking on the "Past Updates" button in the selected goal        Social Hx: Gets meds delivered from 03/14/2019 in [place] x 2yrs. Wife passed away 12/03/14 due to ovarian cancer. Son and wife live with him and drive him around. 2 grandkids 22 and 18.  Was informed he could get his license back. He is going to set up appt.  On list to get COVID vaccine.  AFIB   Patient is currently rate controlled.  Patient has failed these meds in past: None noted Patient is currently controlled on the following medications:   Eliquis 5mg  BID  Metoprolol succinate 25mg  daily  Update 03/25/20 States he is tolerating regimen well with no side effects.   Plan -Continue current medications  Diabetes   Recent Relevant Labs: Lab Results  Component Value Date/Time   HGBA1C 6.7 (A) 02/19/2020 03:05 PM   HGBA1C 7.2 (H) 10/09/2019 01:15 PM   HGBA1C 6.8 (A) 01/12/2019 10:17 AM   HGBA1C 7.9 (H) 01/27/2016 03:18 PM   MICROALBUR 0.9 10/09/2019 01:15 PM   MICROALBUR <0.7 01/11/2018 11:03 AM     Checking BG: 6 times daily (before each meal, 2 hours after, bedtime)  Was unable to get Dexcom Device. Had issues with [facility] DME company.  Had allergy to [employer]. Wonders if [employer] uses same adhesive.   AM 150 (5 units)  Needs appt with Dr. Lafe Garin  Below are 2 week averages of the  following: Recent FBG Readings:131 Recent pre-meal BG readings: Before lunch = 96 Before dinner = 113 Recent 2hr PP BG readings:  After breakfast = 104 after lunch =  98 after dinner = 118 Recent HS BG readings: 100 No BGs <70 Lowest: 79 Highest: 175 Patient has failed these meds in past: metformin (GI), insulin pump (no longer covered by insurance) Patient is currently controlled and improved from previous on the following medications:   Novolog 5-8 units TID w/ meals  Levemir 25 units HS  Last diabetic Eye exam:  Lab Results  Component Value Date/Time   HMDIABEYEEXA No Retinopathy 01/24/2020 12:00 AM   Exam 09/20/18 w/ Dr. Dione Booze  Last diabetic Foot exam: No results found for: HMDIABFOOTEX   We discussed:  Storage of Insulin at Room Temperature Levemir = 42 days Novolog = 28 days  Update 03/25/20 BG this morning 88 No hypo since [name] visit Highest:169  Patient had long discussion with Dexcom rep and decided not to proceed due to concern for cross reactivity since he was allergic to adhesive in [employer] -Continue current medications   Hypertension   BP goal <140/90  Office blood pressures are  BP Readings from Last 3 Encounters:  02/19/20 120/88  02/08/20 130/80  01/28/20 133/79    Patient has failed these meds in the past: amlodipine (pt doesn't remember why he stopped)  Patient is currently controlled on the following medications:   Lisinopril 5mg  daily  Metoprolol succinate 25mg  daily  Patient checks BP at home daily  Patient home BP readings are ranging: 130s/80s  Update 03/25/20 124/72 pulse 51 this AM States he has a list of BP readings he wants to present to [name] at her follow up visit. He asks about low BP. Discussed that a BP of <100/60 would be concerning.  He also asks about metoprolol wondering if he could D/C it.  Discussed that beta blockers are indicated post MI x 3 years. Pt notes that he was taking a beta blocker  previous to his MI in 2017.  Could consider D/C beta blocker, but noting controlled BP would consider continuation unless no longer tolerated.   Plan -Continue current medications    Hyperlipidemia/CAD   Lipid Panel     Component Value Date/Time   CHOL 119 01/01/2020 1126   TRIG 77.0 01/01/2020 1126   HDL 42.40 01/01/2020 1126   CHOLHDL 3 01/01/2020 1126   VLDL  15.4 01/01/2020 1126   LDLCALC 61 01/01/2020 1126    ASCVD 10-year risk: Hx of ASCVD  LDL Goal <70  Patient has failed these meds in past: pravastatin (pt doesn't remember why he stopped; most likely due to intensity) Patient is currently controlled and improved from previous on the following medications:   Atorvastatin 40mg  daily  We discussed:  diet and exercise extensively  Exercise: Has not done much lately. Does ~2-3 miles every other Aine Strycharz.  Update 03/25/20 Lipid panel improved from previous. (LDL 74 to 61)  Plan -Continue current medications  -Resume normal exercise regimen   Vaccines   Reviewed and discussed patient's vaccination history.    Immunization History  Administered Date(s) Administered  . Fluad Quad(high Dose 65+) 05/15/2019  . H1N1 11/19/2008  . Influenza Split 05/25/2011, 04/20/2012  . Influenza Whole 06/06/2009  . Influenza, High Dose Seasonal PF 04/20/2016, 05/12/2017, 05/26/2018  . Influenza,inj,Quad PF,6+ Mos 05/07/2013, 05/27/2014, 05/13/2015  . PFIZER SARS-COV-2 Vaccination 09/30/2019, 10/24/2019  . Pneumococcal Conjugate-13 10/19/2004, 05/11/2013  . Pneumococcal Polysaccharide-23 05/25/2011, 09/08/2018  . Td 08/09/2001, 08/25/2011  . Zoster 08/25/2011   Noted patient completed COVID vaccine series.  Plan -Recommended patient receive Shingrix vaccine in pharmacy.

## 2020-03-31 ENCOUNTER — Telehealth: Payer: Self-pay

## 2020-03-31 NOTE — Telephone Encounter (Signed)
[facility] Patient Assistance Program Refill/Reorder request for Levemir and Novolog has been filled out, signed by Dr. [name] and faxed back to [facility] with confirmation.  

## 2020-04-02 ENCOUNTER — Ambulatory Visit (HOSPITAL_BASED_OUTPATIENT_CLINIC_OR_DEPARTMENT_OTHER)
Admission: RE | Admit: 2020-04-02 | Discharge: 2020-04-02 | Disposition: A | Discharge status: Home / Self Care | Payer: HMO | Source: Ambulatory Visit | Attending: Family | Admitting: Family

## 2020-04-02 ENCOUNTER — Ambulatory Visit (INDEPENDENT_AMBULATORY_CARE_PROVIDER_SITE_OTHER): Payer: HMO | Admitting: Family

## 2020-04-02 ENCOUNTER — Other Ambulatory Visit: Payer: Self-pay

## 2020-04-02 ENCOUNTER — Telehealth: Payer: Self-pay | Admitting: Family

## 2020-04-02 ENCOUNTER — Other Ambulatory Visit: Payer: Self-pay | Admitting: Family

## 2020-04-02 ENCOUNTER — Encounter: Payer: Self-pay | Admitting: Family

## 2020-04-02 VITALS — BP 144/89 | HR 62 | Temp 98.5°F | Resp 16 | Ht 69.0 in | Wt 175.0 lb

## 2020-04-02 DIAGNOSIS — R0989 Other specified symptoms and signs involving the circulatory and respiratory systems: Secondary | ICD-10-CM | POA: Insufficient documentation

## 2020-04-02 DIAGNOSIS — I10 Essential (primary) hypertension: Secondary | ICD-10-CM | POA: Diagnosis not present

## 2020-04-02 DIAGNOSIS — R918 Other nonspecific abnormal finding of lung field: Secondary | ICD-10-CM | POA: Diagnosis not present

## 2020-04-02 DIAGNOSIS — I509 Heart failure, unspecified: Secondary | ICD-10-CM

## 2020-04-02 DIAGNOSIS — E7800 Pure hypercholesterolemia, unspecified: Secondary | ICD-10-CM

## 2020-04-02 DIAGNOSIS — E118 Type 2 diabetes mellitus with unspecified complications: Secondary | ICD-10-CM

## 2020-04-02 MED ORDER — FUROSEMIDE 20 MG PO TABS: 20.0000 mg | ORAL_TABLET | Freq: Every day | ORAL | 3 refills | Status: DC

## 2020-04-02 MED FILL — FUROSEMIDE 20 MG TABLET: 20 | 30 days supply | Qty: 30 | Fill #0

## 2020-04-02 NOTE — Telephone Encounter (Signed)
Please advise pt that I have reviewed his xray and he does have some mild fluid on his lungs.  I would like him to add lasix 20mg  once daily.  (fluid pill)  Follow up in 1 week with me. I will also order an echocardiogram to check his heart.

## 2020-04-02 NOTE — Progress Notes (Signed)
Subjective:    Patient ID: Ryan [name], male    DOB: 02/06/1946, 74 y.o.   MRN: [id number]  HPI  Patient is a 74 yr old male who presents today for follow up.  DM2- following with gherghe.  Reports that he had a recent hypoglycemic event but has been in contact with his endocrinologist about this.  Lab Results  Component Value Date   HGBA1C 6.7 (A) 02/19/2020   HGBA1C 7.2 (H) 10/09/2019   HGBA1C 6.8 (A) 01/12/2019   Lab Results  Component Value Date   MICROALBUR 0.9 10/09/2019   LDLCALC 61 01/01/2020   CREATININE 1.07 01/01/2020   HTN- maintained on toprol xl, lisinopril 5mg . He brings home readings with him today which are reviewed. He home readings look better than today's bp in the office.  BP Readings from Last 3 Encounters:  04/02/20 (!) 144/89  02/19/20 120/88  02/08/20 130/80   Hyperlipidemia- continues lipitor 40mg .  Lab Results  Component Value Date   CHOL 119 01/01/2020   HDL 42.40 01/01/2020   LDLCALC 61 01/01/2020   TRIG 77.0 01/01/2020   CHOLHDL 3 01/01/2020   Wt Readings from Last 3 Encounters:  04/02/20 175 lb (79.4 kg)  02/19/20 172 lb (78 kg)  02/08/20 171 lb 3.2 oz (77.7 kg)     Review of Systems    see HPI  Past Medical History:  Diagnosis Date  . Allergy   . Diabetes mellitus   . GERD (gastroesophageal reflux disease)   . History of chicken pox   . Hyperlipidemia   . Hypertension   . Myocardial infarction (HCC) 12-01-15  . Type 2 diabetes mellitus with hyperglycemia, with long-term current use of insulin (HCC) 09/08/2015     Social History   Socioeconomic History  . Marital status: Widowed    Spouse name: Not on file  . Number of children: 1  . Years of education: Not on file  . Highest education level: Not on file  Occupational History  . Occupation: retired    Comment: from 2018  Tobacco Use  . Smoking status: Former Smoker    Types: Cigarettes    Quit date: 09/01/1965    Years since quitting: 54.6  . Smokeless tobacco:  Never Used  Vaping Use  . Vaping Use: Never used  Substance and Sexual Activity  . Alcohol use: No  . Drug use: No  . Sexual activity: Never  Other Topics Concern  . Not on file  Social History Narrative   Regular exercise:  5 x weekly   Caffeine Use: 1 cups coffee daily.   Retired from Engineering geologist.   Son, daughter in law and young adult granddaughter and grandson live with client   Wife died from ovarian cancer in 01-Dec-2014         Social Determinants of Health   Financial Resource Strain: Low Risk   . Difficulty of Paying Living Expenses: Not hard at all  Food Insecurity: No Food Insecurity  . Worried About Building control surveyor in the Last Year: Never true  . Ran Out of Food in the Last Year: Never true  Transportation Needs: No Transportation Needs  . Lack of Transportation (Medical): No  . Lack of Transportation (Non-Medical): No  Physical Activity: Sufficiently Active  . Days of Exercise per Week: 7 days  . Minutes of Exercise per Session: 30 min  Stress: No Stress Concern Present  . Feeling of Stress : Only a little  Social Connections: Unknown  .  Frequency of Communication with Friends and Family: More than three times a week  . Frequency of Social Gatherings with Friends and Family: More than three times a week  . Attends Religious Services: Never  . Active Member of Clubs or Organizations: Not on file  . Attends Banker Meetings: Not on file  . Marital Status: Widowed  Intimate Partner Violence:   . Fear of Current or Ex-Partner: Not on file  . Emotionally Abused: Not on file  . Physically Abused: Not on file  . Sexually Abused: Not on file    Past Surgical History:  Procedure Laterality Date  . EYE SURGERY  1998   bilateral  . SHOULDER SURGERY  1990   right shoulder, torn rotator cuff.    Family History  Problem Relation Age of Onset  . Cancer Mother        cancer  . Heart disease Mother   . Hyperlipidemia Mother   . Hypertension  Mother   . Hyperlipidemia Father   . Stroke Father   . Hypertension Father   . Diabetes Paternal Grandfather     Allergies  Allergen Reactions  . Other Other (See Comments)    Client states he became very sick and had significant skin inflammation and irritation with the Freestyle Libre sensor    Current Outpatient Medications on File Prior to Visit  Medication Sig Dispense Refill  . apixaban (ELIQUIS) 5 MG TABS tablet Take 1 tablet (5 mg total) by mouth 2 (two) times daily. 60 tablet 5  . atorvastatin (LIPITOR) 40 MG tablet TAKE 1 TABLET BY MOUTH ONCE DAILY **NEED FOLLOW UP FOR FURTHER REFILLS** 30 tablet 2  . Calcium Carbonate-Vitamin D (CALTRATE 600+D) 600-400 MG-UNIT per tablet Take 1 tablet by mouth 2 (two) times daily. Reported on 01/27/2016    . cholecalciferol (VITAMIN D3) 25 MCG (1000 UNIT) tablet Take 1,000 Units by mouth daily.    . colchicine 0.6 MG tablet Take 1 tablet (0.6 mg total) by mouth 2 (two) times daily. 60 tablet 2  . glucagon (GLUCAGON EMERGENCY) 1 MG injection Inject 1 mg into the muscle once as needed for up to 1 dose. 1 each 12  . insulin aspart (NOVOLOG) 100 UNIT/ML injection INJECT 5-8 UNITS INTO THE SKIN 3 TIMES A DAY WITH MEALS 30 mL 5  . insulin detemir (LEVEMIR) 100 UNIT/ML injection INJECT 0.25 ML (25 UNITS) INTO THE SKIN AT BEDTIME 30 mL 5  . lisinopril (ZESTRIL) 5 MG tablet TAKE 1 TABLET BY MOUTH ONCE DAILY 30 tablet 1  . metoprolol succinate (TOPROL-XL) 25 MG 24 hr tablet TAKE 1 TABLET BY MOUTH ONCE DAILY **NEEDS FOLLOW UP FOR FURTHER REFILLS** 30 tablet 2  . TRUEPLUS INSULIN SYRINGE 31G X 5/16" 0.5 ML MISC USE TO INJECT INSULIN 4 TIMES A DAY 300 each 3   No current facility-administered medications on file prior to visit.    BP (!) 144/89 (BP Location: Left Arm, Patient Position: Sitting, Cuff Size: Small)   Pulse 62   Temp 98.5 F (36.9 C) (Oral)   Resp 16   Ht 5\' 9"  (1.753 m)   Wt 175 lb (79.4 kg)   SpO2 97%   BMI 25.84 kg/m     Objective:   Physical Exam Constitutional:      General: He is not in acute distress.    Appearance: He is well-developed.  HENT:     Head: Normocephalic and atraumatic.  Cardiovascular:     Rate and Rhythm: Normal rate  and regular rhythm.     Heart sounds: No murmur heard.   Pulmonary:     Effort: Pulmonary effort is normal. No respiratory distress.     Breath sounds: Examination of the right-lower field reveals rales. Examination of the left-lower field reveals rales. Rales present. No wheezing.  Skin:    General: Skin is warm and dry.  Neurological:     Mental Status: He is alert and oriented to person, place, and time.  Psychiatric:        Behavior: Behavior normal.        Thought Content: Thought content normal.           Assessment & Plan:  HTN- overall bp looks good. Continue current meds.  DM2- recent hypoglycemic event. Management per endocrinology.  Hyperlipidemia- LDL at goal. Continue statin.  Bibasilar rales- obtain CXR to rule out volume overload.   This visit occurred during the SARS-CoV-2 public health emergency.  Safety protocols were in place, including screening questions prior to the visit, additional usage of staff PPE, and extensive cleaning of exam room while observing appropriate contact time as indicated for disinfecting solutions.

## 2020-04-07 ENCOUNTER — Other Ambulatory Visit: Payer: Self-pay | Admitting: Family

## 2020-04-07 MED FILL — ELIQUIS 5 MG TABLET: 5 | 30 days supply | Qty: 60 | Fill #1

## 2020-04-07 MED FILL — ATORVASTATIN CALCIUM 40 MG: 40 | 30 days supply | Qty: 30 | Fill #0

## 2020-04-07 MED FILL — LISINOPRIL 5 MG TABLET: 5 | 30 days supply | Qty: 30 | Fill #1

## 2020-04-07 MED FILL — METOPROLOL SUCCINATE ER 25: 25 | 30 days supply | Qty: 30 | Fill #0

## 2020-04-21 ENCOUNTER — Other Ambulatory Visit: Payer: Self-pay

## 2020-04-21 ENCOUNTER — Ambulatory Visit (HOSPITAL_BASED_OUTPATIENT_CLINIC_OR_DEPARTMENT_OTHER)
Admission: RE | Admit: 2020-04-21 | Discharge: 2020-04-21 | Disposition: A | Discharge status: Home / Self Care | Payer: HMO | Source: Ambulatory Visit | Attending: Family | Admitting: Family

## 2020-04-21 DIAGNOSIS — I509 Heart failure, unspecified: Secondary | ICD-10-CM | POA: Diagnosis not present

## 2020-04-21 LAB — ECHOCARDIOGRAM COMPLETE
Area-P 1/2: 3.42 cm2
MV M vel: 5.43 m/s
MV Peak grad: 117.9 mmHg
Radius: 0.6 cm
S' Lateral: 3.07 cm

## 2020-04-21 NOTE — Progress Notes (Signed)
  Echocardiogram 2D Echocardiogram has been performed.  Ryan [name] 04/21/2020, 9:21 AM

## 2020-04-23 NOTE — Telephone Encounter (Signed)
Patient is aware the NovoLog flex pen and levemir pens are in the fridge for him to pick up  He stated it will be tomorrow as that is when he has transportation

## 2020-04-25 ENCOUNTER — Encounter: Payer: Self-pay | Admitting: Family

## 2020-04-25 ENCOUNTER — Other Ambulatory Visit: Payer: Self-pay

## 2020-04-25 ENCOUNTER — Ambulatory Visit (INDEPENDENT_AMBULATORY_CARE_PROVIDER_SITE_OTHER): Payer: HMO | Admitting: Family

## 2020-04-25 VITALS — BP 130/83 | HR 69 | Temp 98.3°F | Resp 16 | Wt 171.2 lb

## 2020-04-25 DIAGNOSIS — I509 Heart failure, unspecified: Secondary | ICD-10-CM

## 2020-04-25 DIAGNOSIS — E1159 Type 2 diabetes mellitus with other circulatory complications: Secondary | ICD-10-CM

## 2020-04-25 DIAGNOSIS — I152 Hypertension secondary to endocrine disorders: Secondary | ICD-10-CM

## 2020-04-25 DIAGNOSIS — Z23 Encounter for immunization: Secondary | ICD-10-CM

## 2020-04-25 DIAGNOSIS — I10 Essential (primary) hypertension: Secondary | ICD-10-CM | POA: Diagnosis not present

## 2020-04-25 LAB — BASIC METABOLIC PANEL
BUN: 19 mg/dL (ref 7–25)
CO2: 29 mmol/L (ref 20–32)
Calcium: 9.5 mg/dL (ref 8.6–10.3)
Chloride: 100 mmol/L (ref 98–110)
Creat: 1.08 mg/dL (ref 0.70–1.18)
Glucose, Bld: 176 mg/dL — ABNORMAL HIGH (ref 65–99)
Potassium: 4.4 mmol/L (ref 3.5–5.3)
Sodium: 136 mmol/L (ref 135–146)

## 2020-04-25 NOTE — Patient Instructions (Signed)
Please complete lab work prior to leaving.   

## 2020-04-25 NOTE — Progress Notes (Signed)
Subjective:    Patient ID: Ryan [name], male    DOB: July 27, 1946, 74 y.o.   MRN: [id number]  HPI  Patient presents today for follow up. Last visit he was noted to be volume overloaded on exam/x-ray and we started lasix. He reports feeling well since starting lasix with the exception of increased urination.  Since last visit he completed a 2D echo which noted normal LVEF and mild to moderate mitral regurg.   Wt Readings from Last 3 Encounters:  04/25/20 171 lb 3.2 oz (77.7 kg)  04/02/20 175 lb (79.4 kg)  02/19/20 172 lb (78 kg)      Review of Systems See HPI    Past Medical History:  Diagnosis Date  . Allergy   . Diabetes mellitus   . GERD (gastroesophageal reflux disease)   . History of chicken pox   . Hyperlipidemia   . Hypertension   . Myocardial infarction (HCC) 2015-12-11  . Type 2 diabetes mellitus with hyperglycemia, with long-term current use of insulin (HCC) 09/08/2015     Social History   Socioeconomic History  . Marital status: Widowed    Spouse name: Not on file  . Number of children: 1  . Years of education: Not on file  . Highest education level: Not on file  Occupational History  . Occupation: retired    Comment: from Engineering geologist  Tobacco Use  . Smoking status: Former Smoker    Types: Cigarettes    Quit date: 09/01/1965    Years since quitting: 54.6  . Smokeless tobacco: Never Used  Vaping Use  . Vaping Use: Never used  Substance and Sexual Activity  . Alcohol use: No  . Drug use: No  . Sexual activity: Never  Other Topics Concern  . Not on file  Social History Narrative   Regular exercise:  5 x weekly   Caffeine Use: 1 cups coffee daily.   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   Son, daughter in law and young adult granddaughter and grandson live with client   Wife died from ovarian cancer in 12/11/2014         Social Determinants of Health   Financial Resource Strain: Low Risk   . Difficulty of Paying Living Expenses: Not hard at all  Food Insecurity:  No Food Insecurity  . Worried About Programme researcher, broadcasting/film/video in the Last Year: Never true  . Ran Out of Food in the Last Year: Never true  Transportation Needs: No Transportation Needs  . Lack of Transportation (Medical): No  . Lack of Transportation (Non-Medical): No  Physical Activity: Sufficiently Active  . Days of Exercise per Week: 7 days  . Minutes of Exercise per Session: 30 min  Stress: No Stress Concern Present  . Feeling of Stress : Only a little  Social Connections: Unknown  . Frequency of Communication with Friends and Family: More than three times a week  . Frequency of Social Gatherings with Friends and Family: More than three times a week  . Attends Religious Services: Never  . Active Member of Clubs or Organizations: Not on file  . Attends Banker Meetings: Not on file  . Marital Status: Widowed  Intimate Partner Violence:   . Fear of Current or Ex-Partner: Not on file  . Emotionally Abused: Not on file  . Physically Abused: Not on file  . Sexually Abused: Not on file    Past Surgical History:  Procedure Laterality Date  . EYE SURGERY  1996/12/10  bilateral  . SHOULDER SURGERY  1990   right shoulder, torn rotator cuff.    Family History  Problem Relation Age of Onset  . Cancer Mother        cancer  . Heart disease Mother   . Hyperlipidemia Mother   . Hypertension Mother   . Hyperlipidemia Father   . Stroke Father   . Hypertension Father   . Diabetes Paternal Grandfather     Allergies  Allergen Reactions  . Other Other (See Comments)    Client states he became very sick and had significant skin inflammation and irritation with the Freestyle Libre sensor    Current Outpatient Medications on File Prior to Visit  Medication Sig Dispense Refill  . apixaban (ELIQUIS) 5 MG TABS tablet Take 1 tablet (5 mg total) by mouth 2 (two) times daily. 60 tablet 5  . atorvastatin (LIPITOR) 40 MG tablet TAKE 1 TABLET BY MOUTH ONCE DAILY **NEED FOLLOW UP FOR  FURTHER REFILLS** 30 tablet 2  . Calcium Carbonate-Vitamin D (CALTRATE 600+D) 600-400 MG-UNIT per tablet Take 1 tablet by mouth 2 (two) times daily. Reported on 01/27/2016    . cholecalciferol (VITAMIN D3) 25 MCG (1000 UNIT) tablet Take 1,000 Units by mouth daily.    . furosemide (LASIX) 20 MG tablet Take 1 tablet (20 mg total) by mouth daily. 30 tablet 3  . glucagon (GLUCAGON EMERGENCY) 1 MG injection Inject 1 mg into the muscle once as needed for up to 1 dose. 1 each 12  . insulin aspart (NOVOLOG) 100 UNIT/ML injection INJECT 5-8 UNITS INTO THE SKIN 3 TIMES A DAY WITH MEALS 30 mL 5  . insulin detemir (LEVEMIR) 100 UNIT/ML injection INJECT 0.25 ML (25 UNITS) INTO THE SKIN AT BEDTIME 30 mL 5  . lisinopril (ZESTRIL) 5 MG tablet TAKE 1 TABLET BY MOUTH ONCE DAILY 30 tablet 1  . metoprolol succinate (TOPROL-XL) 25 MG 24 hr tablet TAKE 1 TABLET BY MOUTH ONCE DAILY **NEEDS FOLLOW UP FOR FURTHER REFILLS** 30 tablet 2  . TRUEPLUS INSULIN SYRINGE 31G X 5/16" 0.5 ML MISC USE TO INJECT INSULIN 4 TIMES A DAY 300 each 3   No current facility-administered medications on file prior to visit.    BP 130/83 (BP Location: Left Arm, Patient Position: Sitting, Cuff Size: Small)   Pulse 69   Temp 98.3 F (36.8 C) (Oral)   Resp 16   Wt 171 lb 3.2 oz (77.7 kg)   SpO2 97%   BMI 25.28 kg/m    Objective:   Physical Exam Constitutional:      General: He is not in acute distress.    Appearance: He is well-developed.  HENT:     Head: Normocephalic and atraumatic.  Cardiovascular:     Rate and Rhythm: Normal rate and regular rhythm.     Heart sounds: No murmur heard.   Pulmonary:     Effort: Pulmonary effort is normal. No respiratory distress.     Breath sounds: Examination of the right-lower field reveals rales. Examination of the left-lower field reveals rales. Rales (rales are much more faint than last visit.) present. No wheezing.  Skin:    General: Skin is warm and dry.  Neurological:     Mental  Status: He is alert and oriented to person, place, and time.  Psychiatric:        Behavior: Behavior normal.        Thought Content: Thought content normal.           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 Clinically stable and down 4 pounds since starting furosemide. Will continue same and obtain follow up bmet.   This visit occurred during the SARS-CoV-2 public health emergency.  Safety protocols were in place, including screening questions prior to the visit, additional usage of staff PPE, and extensive cleaning of exam room while observing appropriate contact time as indicated for disinfecting solutions.

## 2020-05-05 ENCOUNTER — Other Ambulatory Visit (HOSPITAL_BASED_OUTPATIENT_CLINIC_OR_DEPARTMENT_OTHER): Payer: HMO

## 2020-05-06 ENCOUNTER — Other Ambulatory Visit: Payer: Self-pay | Admitting: Family

## 2020-05-06 DIAGNOSIS — I10 Essential (primary) hypertension: Secondary | ICD-10-CM

## 2020-05-06 MED FILL — METOPROLOL SUCCINATE ER 25: 25 | 30 days supply | Qty: 30 | Fill #1

## 2020-05-06 MED FILL — LISINOPRIL 5 MG TABLET: 5 | 90 days supply | Qty: 90 | Fill #0

## 2020-05-06 MED FILL — ATORVASTATIN 40 MG TABLET: 40 | 30 days supply | Qty: 30 | Fill #1

## 2020-05-06 MED FILL — ELIQUIS 5 MG TABLET: 5 | 30 days supply | Qty: 60 | Fill #2

## 2020-05-06 MED FILL — FUROSEMIDE 20 MG TABS: 20 | 30 days supply | Qty: 30 | Fill #0

## 2020-05-29 ENCOUNTER — Telehealth: Payer: Self-pay | Admitting: Pharmacist

## 2020-05-29 NOTE — Progress Notes (Addendum)
Chronic Care Management Pharmacy Assistant   Name: FILBERT ECKART  MRN: [id number] DOB: [birth year]  Reason for Encounter: General Adherence Call  Patient Questions:  1.  Have you seen any other providers since your last visit?Yes   2.  Any changes in your medicines or health? No    PCP : [name], NP   Their chronic conditions include: Afib/flutter, DM, HTN, HLD, GERD, Osteopenia, Gout.  Office Visit: 04-25-2020 (PCP) : Patient presented in the office for f/u. The notes indicate last visit he was noted to be volume overloaded on exam/x-ray and we started lasix. He reports feeling well since starting lasix with the exception of increased urination. Clinically stable and down 4 pounds since starting furosemide.  04-02-2020 (PCP): Patient presented in the office for a follow up. Patient reported a  recent hypoglycemic event but has been in contact with his endocrinologist about this. Patient's BP was 144/89 at the time of the visit. Home readings were better than blood pressure taken at the time of the visit. A CXR was ordered to rule out volume overload for Bibasilar rales. Medication changes: colchicine 0.6 mg tab twice a day was discontinued.   Consults: 04-21-2020 (Cardio)- Echocardiogram  Allergies:   Allergies  Allergen Reactions   Other Other (See Comments)    Client states he became very sick and had significant skin inflammation and irritation with the Freestyle Libre sensor    Medications: Outpatient Encounter Medications as of 05/29/2020  Medication Sig   apixaban (ELIQUIS) 5 MG TABS tablet Take 1 tablet (5 mg total) by mouth 2 (two) times daily.   atorvastatin (LIPITOR) 40 MG tablet TAKE 1 TABLET BY MOUTH ONCE DAILY **NEED FOLLOW UP FOR FURTHER REFILLS**   Calcium Carbonate-Vitamin D (CALTRATE 600+D) 600-400 MG-UNIT per tablet Take 1 tablet by mouth 2 (two) times daily. Reported on 01/27/2016   cholecalciferol (VITAMIN D3) 25 MCG (1000 UNIT) tablet Take 1,000  Units by mouth daily.   furosemide (LASIX) 20 MG tablet Take 1 tablet (20 mg total) by mouth daily.   glucagon (GLUCAGON EMERGENCY) 1 MG injection Inject 1 mg into the muscle once as needed for up to 1 dose.   insulin aspart (NOVOLOG) 100 UNIT/ML injection INJECT 5-8 UNITS INTO THE SKIN 3 TIMES A DAY WITH MEALS   insulin detemir (LEVEMIR) 100 UNIT/ML injection INJECT 0.25 ML (25 UNITS) INTO THE SKIN AT BEDTIME   lisinopril (ZESTRIL) 5 MG tablet Take 1 tablet (5 mg total) by mouth daily.   metoprolol succinate (TOPROL-XL) 25 MG 24 hr tablet TAKE 1 TABLET BY MOUTH ONCE DAILY **NEEDS FOLLOW UP FOR FURTHER REFILLS**   TRUEPLUS INSULIN SYRINGE 31G X 5/16" 0.5 ML MISC USE TO INJECT INSULIN 4 TIMES A DAY   No facility-administered encounter medications on file as of 05/29/2020.    Current Diagnosis: Patient Active Problem List   Diagnosis Date Noted   Left foot pain 04/12/2019   Overweight (BMI 25.0-29.9) 01/12/2019   Atrial flutter ([place]) 01/27/2016   Elevated troponin 01/27/2016   Poorly controlled type 2 diabetes mellitus with circulatory disorder ([place]) 09/08/2015   Preventative health care 02/05/2015   Benign paroxysmal positional vertigo 01/15/2014   Skin lesion of left arm 05/11/2013   Osteopenia 06/09/2011   Hyperbilirubinemia 05/26/2011   HTN (hypertension) 02/22/2011   Hyperlipidemia 02/22/2011   GERD (gastroesophageal reflux disease) 02/22/2011   Allergic rhinitis 02/22/2011    Goals Addressed   None    Contacted the patient for a General Adherence  touch point. Patient stated his blood pressure has been good at home. The reading provided from this morning was 118/73. While reviewing Adherence Data for glucagon kit, I inquired if the patient needed this medication and the status of fill. Patient is receiving patient assistance for insulin with [employer]. Patient states He did use the emergency glucagon kit some time ago, but has not needed it since he BS has been controled  with insulin. The cost of glucagon is $70 and he was unable to purchase it at the time. Informed the patient I would be assisting in the re-enrollment with patient assistance in December. May also qualify for PAP with Eliquis 5 mg tab.   Follow-Up:  Pharmacist Review   Fanny Skates, [place] Pharmacist Assistant [phone]  Reviewed by: De Blanch, PharmD Clinical Pharmacist [facility] at [facility] [phone]

## 2020-06-06 ENCOUNTER — Other Ambulatory Visit: Payer: Self-pay | Admitting: Family

## 2020-06-06 DIAGNOSIS — I10 Essential (primary) hypertension: Secondary | ICD-10-CM

## 2020-06-06 MED FILL — FUROSEMIDE 20 MG TABS: 20 | 30 days supply | Qty: 30 | Fill #1

## 2020-06-06 MED FILL — METOPROLOL SUCCINATE ER 25: 25 | 30 days supply | Qty: 30 | Fill #2

## 2020-06-06 MED FILL — ELIQUIS 5 MG TABLET: 5 | 30 days supply | Qty: 60 | Fill #3

## 2020-06-06 MED FILL — ATORVASTATIN 40 MG TABLET: 40 | 30 days supply | Qty: 30 | Fill #2

## 2020-06-20 ENCOUNTER — Other Ambulatory Visit: Payer: Self-pay

## 2020-06-20 NOTE — Patient Outreach (Signed)
  [facility]) Care Management Chronic Special Needs Program    06/20/2020  Name: Ryan [name], DOB: 1946-05-16  MRN: [id number]   [name] is enrolled in a chronic special needs plan for Diabetes.  Client is transitioning into another [employer] case management program as of 08/09/20.  Case manager will no longer be following goal after 08/08/20.    George Ina RN,BSN,CCM Chronic Care Management Coordinator [facility] Care Management [phone]

## 2020-06-24 ENCOUNTER — Telehealth: Payer: Self-pay | Admitting: Pharmacist

## 2020-06-24 NOTE — Progress Notes (Addendum)
Chronic Care Management Pharmacy Assistant   Name: Ryan [name]  MRN: [id number] DOB: 08-Mar-1946  Reason for Encounter: DM Disease State  Patient Questions:  1.  Have you seen any other providers since your last visit? No  2.  Any changes in your medicines or health? No  PCP : Sandford Craze, NP   Their chronic conditions include: Afib/flutter, DM, HTN, HLD, GERD, Osteopenia, Gout.  Office Visits: None since their last CCM Disease State Call with the pharmacist assistant on 05-29-2020.  Consults: None since their last CCM Disease State Call with the pharmacist assistant on 05-29-2020.  Allergies:   Allergies  Allergen Reactions   Other Other (See Comments)    Client states he became very sick and had significant skin inflammation and irritation with the Freestyle Libre sensor    Medications: Outpatient Encounter Medications as of 06/24/2020  Medication Sig   apixaban (ELIQUIS) 5 MG TABS tablet Take 1 tablet (5 mg total) by mouth 2 (two) times daily.   atorvastatin (LIPITOR) 40 MG tablet TAKE 1 TABLET BY MOUTH ONCE DAILY **NEED FOLLOW UP FOR FURTHER REFILLS**   Calcium Carbonate-Vitamin D (CALTRATE 600+D) 600-400 MG-UNIT per tablet Take 1 tablet by mouth 2 (two) times daily. Reported on 01/27/2016   cholecalciferol (VITAMIN D3) 25 MCG (1000 UNIT) tablet Take 1,000 Units by mouth daily.   furosemide (LASIX) 20 MG tablet Take 1 tablet (20 mg total) by mouth daily.   glucagon (GLUCAGON EMERGENCY) 1 MG injection Inject 1 mg into the muscle once as needed for up to 1 dose.   insulin aspart (NOVOLOG) 100 UNIT/ML injection INJECT 5-8 UNITS INTO THE SKIN 3 TIMES A DAY WITH MEALS   insulin detemir (LEVEMIR) 100 UNIT/ML injection INJECT 0.25 ML (25 UNITS) INTO THE SKIN AT BEDTIME   lisinopril (ZESTRIL) 5 MG tablet Take 1 tablet (5 mg total) by mouth daily.   metoprolol succinate (TOPROL-XL) 25 MG 24 hr tablet TAKE 1 TABLET BY MOUTH ONCE DAILY **NEEDS FOLLOW UP FOR FURTHER  REFILLS**   TRUEPLUS INSULIN SYRINGE 31G X 5/16" 0.5 ML MISC USE TO INJECT INSULIN 4 TIMES A DAY   No facility-administered encounter medications on file as of 06/24/2020.    Current Diagnosis: Patient Active Problem List   Diagnosis Date Noted   Left foot pain 04/12/2019   Overweight (BMI 25.0-29.9) 01/12/2019   Atrial flutter (HCC) 01/27/2016   Elevated troponin 01/27/2016   Poorly controlled type 2 diabetes mellitus with circulatory disorder (HCC) 09/08/2015   Preventative health care 02/05/2015   Benign paroxysmal positional vertigo 01/15/2014   Skin lesion of left arm 05/11/2013   Osteopenia 06/09/2011   Hyperbilirubinemia 05/26/2011   HTN (hypertension) 02/22/2011   Hyperlipidemia 02/22/2011   GERD (gastroesophageal reflux disease) 02/22/2011   Allergic rhinitis 02/22/2011    Goals Addressed   None    Recent Relevant Labs: Lab Results  Component Value Date/Time   HGBA1C 6.7 (A) 02/19/2020 03:05 PM   HGBA1C 7.2 (H) 10/09/2019 01:15 PM   HGBA1C 6.8 (A) 01/12/2019 10:17 AM   HGBA1C 7.9 (H) 01/27/2016 03:18 PM   MICROALBUR 0.9 10/09/2019 01:15 PM   MICROALBUR <0.7 01/11/2018 11:03 AM    Kidney Function Lab Results  Component Value Date/Time   CREATININE 1.08 04/25/2020 11:43 AM   CREATININE 1.07 01/01/2020 11:26 AM   CREATININE 1.07 10/09/2019 01:15 PM   CREATININE 0.93 01/15/2014 08:43 AM   GFR 67.62 01/01/2020 11:26 AM   GFRNONAA 68 04/04/2017 02:43 PM   GFRNONAA  85 01/15/2014 08:43 AM   GFRAA 79 04/04/2017 02:43 PM   GFRAA >89 01/15/2014 08:43 AM    Current antihyperglycemic regimen:  Novolog 5-8 units TID w/ meals Levemir 25 units HS  What recent interventions/DTPs have been made to improve glycemic control:  none  Have there been any recent hospitalizations or ED visits since last visit with CPP? No   Patient denies hypoglycemic symptoms, including None   Patient denies hyperglycemic symptoms, including none   How often are you checking your  blood sugar? before meals and at bedtime   What are your blood sugars ranging?  Fasting: 106 After meals: 80-90 Bedtime: 119  During the week, how often does your blood glucose drop below 70? Never   Are you checking your feet daily/regularly? Yes, patient states his feet are in good shape  Adherence Review: Is the patient currently on a STATIN medication? Yes atorvastatin 40 MG tablet Is the patient currently on ACE/ARB medication? Yes lisinopril 5 MG tablet Does the patient have >5 day gap between last estimated fill dates? No    Follow-Up:  Pharmacist Review   Corwin Levins, [facility] Clinical Pharmacist Assistant [phone]  Reviewed by: Katrinka Blazing, PharmD Clinical Pharmacist [facility] at [facility] [phone]

## 2020-06-26 ENCOUNTER — Ambulatory Visit (INDEPENDENT_AMBULATORY_CARE_PROVIDER_SITE_OTHER): Payer: HMO | Admitting: Internal Medicine

## 2020-06-26 ENCOUNTER — Encounter: Payer: Self-pay | Admitting: Internal Medicine

## 2020-06-26 ENCOUNTER — Other Ambulatory Visit: Payer: Self-pay

## 2020-06-26 VITALS — BP 110/70 | HR 68 | Ht 69.0 in | Wt 170.0 lb

## 2020-06-26 DIAGNOSIS — E663 Overweight: Secondary | ICD-10-CM | POA: Diagnosis not present

## 2020-06-26 DIAGNOSIS — E1159 Type 2 diabetes mellitus with other circulatory complications: Secondary | ICD-10-CM | POA: Diagnosis not present

## 2020-06-26 DIAGNOSIS — E1165 Type 2 diabetes mellitus with hyperglycemia: Secondary | ICD-10-CM

## 2020-06-26 DIAGNOSIS — E785 Hyperlipidemia, unspecified: Secondary | ICD-10-CM

## 2020-06-26 LAB — POCT GLYCOSYLATED HEMOGLOBIN (HGB A1C): Hemoglobin A1C: 6.5 % — AB (ref 4.0–5.6)

## 2020-06-26 NOTE — Patient Instructions (Addendum)
Please continue: - Levemir 25 units at bedtime - Novolog mealtime 5-8 units 3x a day before meals (if you are active after a meal, take no more than 5 units before that meal) - Novolog sliding scale as follows:  - 150-175: + 1 unit  - 176-200: + 2 units  - 201-225: + 3 units  - 226-250: + 4 units  - >250: + 5 units  Please write down the lows, also.  Let me know if you have any more lows at night.  Please return in 4 months with your sugar log.
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
## 2020-06-26 NOTE — Addendum Note (Signed)
Addended by: Kenyon Ana on: 06/26/2020 03:07 PM   Modules accepted: Orders

## 2020-07-08 ENCOUNTER — Other Ambulatory Visit: Payer: Self-pay | Admitting: Family

## 2020-07-08 MED FILL — FUROSEMIDE 20 MG TABS: 20 | 30 days supply | Qty: 30 | Fill #2

## 2020-07-08 MED FILL — METOPROLOL SUCCINATE ER 25: 25 | 30 days supply | Qty: 30 | Fill #0

## 2020-07-08 MED FILL — ELIQUIS 5 MG TABLET: 5 | 30 days supply | Qty: 60 | Fill #4

## 2020-07-08 MED FILL — ATORVASTATIN 40 MG TABLET: 40 | 30 days supply | Qty: 30 | Fill #0

## 2020-07-11 ENCOUNTER — Telehealth: Payer: Self-pay | Admitting: Pharmacist

## 2020-07-11 NOTE — Progress Notes (Addendum)
    Chronic Care Management Pharmacy Assistant   Name: Ryan [name]  MRN: [id number] DOB: 10-20-1945  Reason for Encounter: Patient Assistance Documentation   PCP : Sandford Craze, NP  Allergies:   Allergies  Allergen Reactions   Other Other (See Comments)    Client states he became very sick and had significant skin inflammation and irritation with the Freestyle Libre sensor    Medications: Outpatient Encounter Medications as of 07/11/2020  Medication Sig   apixaban (ELIQUIS) 5 MG TABS tablet Take 1 tablet (5 mg total) by mouth 2 (two) times daily.   atorvastatin (LIPITOR) 40 MG tablet Take 1 tablet (40 mg total) by mouth daily.   Calcium Carbonate-Vitamin D (CALTRATE 600+D) 600-400 MG-UNIT per tablet Take 1 tablet by mouth 2 (two) times daily. Reported on 01/27/2016   cholecalciferol (VITAMIN D3) 25 MCG (1000 UNIT) tablet Take 1,000 Units by mouth daily.   furosemide (LASIX) 20 MG tablet Take 1 tablet (20 mg total) by mouth daily.   glucagon (GLUCAGON EMERGENCY) 1 MG injection Inject 1 mg into the muscle once as needed for up to 1 dose.   insulin aspart (NOVOLOG) 100 UNIT/ML injection INJECT 5-8 UNITS INTO THE SKIN 3 TIMES A DAY WITH MEALS   insulin detemir (LEVEMIR) 100 UNIT/ML injection INJECT 0.25 ML (25 UNITS) INTO THE SKIN AT BEDTIME   lisinopril (ZESTRIL) 5 MG tablet Take 1 tablet (5 mg total) by mouth daily.   metoprolol succinate (TOPROL-XL) 25 MG 24 hr tablet Take 1 tablet (25 mg total) by mouth daily.   TRUEPLUS INSULIN SYRINGE 31G X 5/16" 0.5 ML MISC USE TO INJECT INSULIN 4 TIMES A DAY   No facility-administered encounter medications on file as of 07/11/2020.    Current Diagnosis: Patient Active Problem List   Diagnosis Date Noted   Left foot pain 04/12/2019   Overweight (BMI 25.0-29.9) 01/12/2019   Atrial flutter (HCC) 01/27/2016   Elevated troponin 01/27/2016   Poorly controlled type 2 diabetes mellitus with circulatory disorder (HCC) 09/08/2015    Preventative health care 02/05/2015   Benign paroxysmal positional vertigo 01/15/2014   Skin lesion of left arm 05/11/2013   Osteopenia 06/09/2011   Hyperbilirubinemia 05/26/2011   HTN (hypertension) 02/22/2011   Hyperlipidemia 02/22/2011   GERD (gastroesophageal reflux disease) 02/22/2011   Allergic rhinitis 02/22/2011    Goals Addressed   None    Contacted the patient to inform him that I would be mailing him PAP application for Eliquis. Review the application and detail and went over what he is to complete. Advised the patient to return entire application along with his income information to the clinic so that we can finish the process on his behalf. Plan on following up with the Alver Fisher in two to three weeks for a status update.  Follow-Up:  Pharmacist Review   Corwin Levins, [facility] Clinical Pharmacist Assistant [phone]  Reviewed by: Katrinka Blazing, PharmD, BCACP Clinical Pharmacist [facility] [phone]

## 2020-07-17 ENCOUNTER — Telehealth: Payer: Self-pay | Admitting: Pharmacist

## 2020-07-17 NOTE — Progress Notes (Addendum)
Sending a renewal patient assistance application to the patient for Novolog and Levemir through [employer].  Corwin Levins, CMA Clinical Pharmacist Assistant [phone]  Reviewed by: Katrinka Blazing, PharmD, BCACP Clinical Pharmacist [facility] [phone]

## 2020-07-25 ENCOUNTER — Ambulatory Visit (INDEPENDENT_AMBULATORY_CARE_PROVIDER_SITE_OTHER): Payer: HMO | Admitting: Family

## 2020-07-25 ENCOUNTER — Other Ambulatory Visit: Payer: Self-pay

## 2020-07-25 ENCOUNTER — Other Ambulatory Visit: Payer: Self-pay | Admitting: Family

## 2020-07-25 VITALS — BP 140/85 | HR 59 | Temp 98.1°F | Ht 69.0 in | Wt 170.4 lb

## 2020-07-25 DIAGNOSIS — I10 Essential (primary) hypertension: Secondary | ICD-10-CM

## 2020-07-25 DIAGNOSIS — I5032 Chronic diastolic (congestive) heart failure: Secondary | ICD-10-CM | POA: Diagnosis not present

## 2020-07-25 DIAGNOSIS — I340 Nonrheumatic mitral (valve) insufficiency: Secondary | ICD-10-CM

## 2020-07-25 DIAGNOSIS — H519 Unspecified disorder of binocular movement: Secondary | ICD-10-CM

## 2020-07-25 DIAGNOSIS — I4892 Unspecified atrial flutter: Secondary | ICD-10-CM

## 2020-07-25 DIAGNOSIS — H5500 Unspecified nystagmus: Secondary | ICD-10-CM

## 2020-07-25 LAB — BASIC METABOLIC PANEL
BUN: 15 mg/dL (ref 6–23)
CO2: 28 mEq/L (ref 19–32)
Calcium: 9.3 mg/dL (ref 8.4–10.5)
Chloride: 99 mEq/L (ref 96–112)
Creatinine, Ser: 0.99 mg/dL (ref 0.40–1.50)
GFR: 75.2 mL/min (ref >60.00)
Glucose, Bld: 250 mg/dL — ABNORMAL HIGH (ref 70–99)
Potassium: 4.3 mEq/L (ref 3.5–5.1)
Sodium: 133 mEq/L — ABNORMAL LOW (ref 135–145)

## 2020-07-25 MED ORDER — ATORVASTATIN CALCIUM 40 MG PO TABS: 40.0000 mg | ORAL_TABLET | Freq: Every day | ORAL | 1 refills | Status: DC

## 2020-07-25 MED ORDER — FUROSEMIDE 20 MG PO TABS: 20.0000 mg | ORAL_TABLET | Freq: Every day | ORAL | 1 refills | Status: DC

## 2020-07-25 MED ORDER — METOPROLOL SUCCINATE ER 25 MG PO TB24: 25.0000 mg | ORAL_TABLET | Freq: Every day | ORAL | 1 refills | Status: DC

## 2020-07-25 NOTE — Patient Instructions (Signed)
Please complete lab work prior to leaving.   
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
## 2020-07-27 DIAGNOSIS — I5032 Chronic diastolic (congestive) heart failure: Secondary | ICD-10-CM | POA: Insufficient documentation

## 2020-07-27 DIAGNOSIS — I340 Nonrheumatic mitral (valve) insufficiency: Secondary | ICD-10-CM | POA: Insufficient documentation

## 2020-07-30 NOTE — Addendum Note (Signed)
Addended by: Sandford Craze on: 07/30/2020 03:25 PM   Modules accepted: Orders

## 2020-07-31 MED FILL — ATORVASTATIN CALCIUM 40 MG: 40 | 90 days supply | Qty: 90 | Fill #0

## 2020-07-31 MED FILL — METOPROLOL SUCCINATE ER 25: 25 | 90 days supply | Qty: 90 | Fill #0

## 2020-07-31 MED FILL — FUROSEMIDE 20 MG TAB: 20 | 90 days supply | Qty: 90 | Fill #0

## 2020-08-06 ENCOUNTER — Ambulatory Visit (HOSPITAL_BASED_OUTPATIENT_CLINIC_OR_DEPARTMENT_OTHER)
Admission: RE | Admit: 2020-08-06 | Discharge: 2020-08-06 | Disposition: A | Discharge status: Home / Self Care | Payer: HMO | Source: Ambulatory Visit | Attending: Family | Admitting: Family

## 2020-08-06 ENCOUNTER — Other Ambulatory Visit: Payer: Self-pay

## 2020-08-06 DIAGNOSIS — H519 Unspecified disorder of binocular movement: Secondary | ICD-10-CM | POA: Diagnosis not present

## 2020-08-06 DIAGNOSIS — Z87828 Personal history of other (healed) physical injury and trauma: Secondary | ICD-10-CM | POA: Diagnosis not present

## 2020-08-06 DIAGNOSIS — H5500 Unspecified nystagmus: Secondary | ICD-10-CM

## 2020-08-06 DIAGNOSIS — H5509 Other forms of nystagmus: Secondary | ICD-10-CM | POA: Insufficient documentation

## 2020-08-07 MED FILL — LISINOPRIL 5 MG TABLET: 5 | 90 days supply | Qty: 90 | Fill #0

## 2020-08-07 MED FILL — ELIQUIS 5 MG TABLET: 5 | 30 days supply | Qty: 60 | Fill #5

## 2020-08-12 ENCOUNTER — Other Ambulatory Visit: Payer: Self-pay

## 2020-08-18 ENCOUNTER — Telehealth: Payer: Self-pay | Admitting: Pharmacist

## 2020-08-18 NOTE — Progress Notes (Addendum)
Chronic Care Management Pharmacy Assistant   Name: Ryan [name]  MRN: [id number] DOB: 1946/01/16  Reason for Encounter: Patient Assistance Documentation   PCP : [name], NP  Allergies:   Allergies  Allergen Reactions   Other Other (See Comments)    Client states he became very sick and had significant skin inflammation and irritation with the Freestyle Libre sensor    Medications: Outpatient Encounter Medications as of 08/18/2020  Medication Sig   apixaban (ELIQUIS) 5 MG TABS tablet Take 1 tablet (5 mg total) by mouth 2 (two) times daily.   atorvastatin (LIPITOR) 40 MG tablet Take 1 tablet (40 mg total) by mouth daily.   Calcium Carbonate-Vitamin D 600-400 MG-UNIT tablet Take 1 tablet by mouth 2 (two) times daily. Reported on 01/27/2016   cholecalciferol (VITAMIN D3) 25 MCG (1000 UNIT) tablet Take 1,000 Units by mouth daily.   furosemide (LASIX) 20 MG tablet Take 1 tablet (20 mg total) by mouth daily.   glucagon (GLUCAGON EMERGENCY) 1 MG injection Inject 1 mg into the muscle once as needed for up to 1 dose.   insulin aspart (NOVOLOG) 100 UNIT/ML injection INJECT 5-8 UNITS INTO THE SKIN 3 TIMES A DAY WITH MEALS   insulin detemir (LEVEMIR) 100 UNIT/ML injection INJECT 0.25 ML (25 UNITS) INTO THE SKIN AT BEDTIME   lisinopril (ZESTRIL) 5 MG tablet Take 1 tablet (5 mg total) by mouth daily.   metoprolol succinate (TOPROL-XL) 25 MG 24 hr tablet Take 1 tablet (25 mg total) by mouth daily.   TRUEPLUS INSULIN SYRINGE 31G X 5/16" 0.5 ML MISC USE TO INJECT INSULIN 4 TIMES A DAY   No facility-administered encounter medications on file as of 08/18/2020.    Current Diagnosis: Patient Active Problem List   Diagnosis Date Noted   Chronic diastolic CHF (congestive heart failure) ([place]) 07/27/2020   Moderate mitral regurgitation 07/27/2020   Left foot pain 04/12/2019   Overweight (BMI 25.0-29.9) 01/12/2019   Atrial flutter ([place]) 01/27/2016   Elevated troponin 01/27/2016    Poorly controlled type 2 diabetes mellitus with circulatory disorder ([place]) 09/08/2015   Preventative health care 02/05/2015   Benign paroxysmal positional vertigo 01/15/2014   Skin lesion of left arm 05/11/2013   Osteopenia 06/09/2011   Hyperbilirubinemia 05/26/2011   HTN (hypertension) 02/22/2011   Hyperlipidemia 02/22/2011   GERD (gastroesophageal reflux disease) 02/22/2011   Allergic rhinitis 02/22/2011    Goals Addressed   None    Started 2022 pap for NovoLog and Levemir with [employer] patient assistance program. Completed as much information as possible. Realized an application had been completed and mailed to the patient on 07/17/20. [employer] for a status update. Wait time extended five mins. Left a message for call back.  Contacted the patient to check if he received the applications mailed to him. Patient stated he completed the paperwork and returned it to Dr.[name]'s office. Called [place] to inquire if application had been received. Per Ray at [facility] an application has not been received. Inquired if it was possible to review an application if patient hadn't met 3% out of pocket medication expenses. I was told that an application could still be reviewed without out of pocket expenses depending on the patient's income.   Follow-Up:  Pharmacist Review   Fanny Skates, [place] Pharmacist Assistant [phone]  Faxed appropriate Novo Nordisk application portions to Dr. Arman Filter Office.  Faxed appropriate BMS application portions to Dr. Alan Ripper Office.   41 minutes spent in review,  coordination, and documentation.   Reviewed by: De Blanch, PharmD, BCACP Clinical Pharmacist [facility] [phone]

## 2020-08-25 ENCOUNTER — Telehealth: Payer: Self-pay

## 2020-08-25 NOTE — Telephone Encounter (Addendum)
We received the MD page of a BMSPAF application for Eliquis from [facility] from [facility] requesting that we complete the page, have Dr Tenny Craw sign/date it, and to fax back to her at [phone] and that once she receives this MD page back she will fax all of the pts application to [employer]MSPA[employer]. I have completed the MD page and e-mailed it to Dr Malachy Mood nurse so she can obtain Dr Charlott Rakes signature, date it, and to fax back to [place] at number written on cover letter.

## 2020-08-27 ENCOUNTER — Telehealth: Payer: Self-pay

## 2020-08-27 NOTE — Telephone Encounter (Signed)
Received [employer] patient assistance application that was completed and faxed back to Kanesha Day at [phone] on behalf of the patient.

## 2020-08-29 NOTE — Telephone Encounter (Signed)
BMSPAF (physician page) signed by Dr. Tenny Craw. Faxed to Katrinka Blazing, PharmD at [phone].

## 2020-09-01 NOTE — Telephone Encounter (Signed)
Thanks so much for coordinating this for the patient, Ryan [name]!   Windell Moulding,  Have you received this paperwork for the patient? If so, please coordinate with Burnetta Sabin to assure the application has been completed and submitted for the patient.   Thanks, Dannielle Karvonen

## 2020-09-02 NOTE — Telephone Encounter (Signed)
Burnetta Sabin,   Please coordinate with Windell Moulding to get this resolved for the patient.  If the form has not been received at the office, we might have to request that Lendon Ka at Dr. Charlott Rakes office resend it so we can complete the application.  Windell Moulding,   Please double check for the form in my box up front or [name]'s box up front. If not there, please let Burnetta Sabin know and she can take it from there.   Thanks, Katrinka Blazing, PharmD, [facility] Clinical Pharmacist

## 2020-09-08 ENCOUNTER — Ambulatory Visit: Payer: HMO | Admitting: Internal Medicine

## 2020-09-08 ENCOUNTER — Encounter: Payer: Self-pay | Admitting: Internal Medicine

## 2020-09-08 ENCOUNTER — Other Ambulatory Visit: Payer: Self-pay

## 2020-09-08 VITALS — BP 140/82 | HR 64 | Ht 69.75 in | Wt 174.0 lb

## 2020-09-08 DIAGNOSIS — I509 Heart failure, unspecified: Secondary | ICD-10-CM

## 2020-09-08 DIAGNOSIS — I4892 Unspecified atrial flutter: Secondary | ICD-10-CM | POA: Diagnosis not present

## 2020-09-08 NOTE — Patient Instructions (Signed)
Medication Instructions:  No changes *If you need a refill on your cardiac medications before your next appointment, please call your pharmacy*   Lab Work: Today: CBC If you have labs (blood work) drawn today and your tests are completely normal, you will receive your results only by: . MyChart Message (if you have MyChart) OR . A paper copy in the mail If you have any lab test that is abnormal or we need to change your treatment, we will call you to review the results.   Testing/Procedures: none   Follow-Up: At [facility], you and your health needs are our priority.  As part of our continuing mission to provide you with exceptional heart care, we have created designated Provider Care Teams.  These Care Teams include your primary Cardiologist (physician) and Advanced Practice Providers (APPs -  Physician Assistants and Nurse Practitioners) who all work together to provide you with the care you need, when you need it.  Your next appointment:   9 month(s)  The format for your next appointment:   In Person  Provider:   You may see Paula [name], MD or one of the following Advanced Practice Providers on your designated Care Team:    Scott Weaver, PA-C  Vin Bhagat, PA-C    Other Instructions   

## 2020-09-08 NOTE — Progress Notes (Signed)
Cardiology Office Note   Date:  09/08/2020   ID:  Ryan [name], DOB November 02, 1945, MRN [id number]  PCP:  Sandford Craze, NP  Cardiologist:   Dietrich Pates, MD   F/U of atrial flutter and HTN      History of Present Illness: Ryan [name] is a 75 y.o. male with a history of atrial flutter  Placed on Eliquis in 2017Hx of  Lexiscan in Aug 2017 was normal  Echo  In 2017 with normal LVEF   I last saw the pt in July 2021  Since then he was seen by Verdene Lennert   Set up for an echocardiogram for murmur   He had this done in the fall   LVEF and RVEF wre normal  MR was mild to moderate  Since seen he continues to do well from a caridac standpoint  Breathing is good   He denies CP  No palpitaitons   No dizziness  No edenm in legs    Outpatient Medications Prior to Visit  Medication Sig Dispense Refill  . apixaban (ELIQUIS) 5 MG TABS tablet Take 1 tablet (5 mg total) by mouth 2 (two) times daily. 60 tablet 5  . atorvastatin (LIPITOR) 40 MG tablet Take 1 tablet (40 mg total) by mouth daily. 90 tablet 1  . Calcium Carbonate-Vitamin D 600-400 MG-UNIT tablet Take 1 tablet by mouth 2 (two) times daily. Reported on 01/27/2016    . cholecalciferol (VITAMIN D3) 25 MCG (1000 UNIT) tablet Take 1,000 Units by mouth daily.    . furosemide (LASIX) 20 MG tablet Take 1 tablet (20 mg total) by mouth daily. 90 tablet 1  . glucagon (GLUCAGON EMERGENCY) 1 MG injection Inject 1 mg into the muscle once as needed for up to 1 dose. 1 each 12  . insulin aspart (NOVOLOG) 100 UNIT/ML injection INJECT 5-8 UNITS INTO THE SKIN 3 TIMES A DAY WITH MEALS 30 mL 5  . insulin detemir (LEVEMIR) 100 UNIT/ML injection INJECT 0.25 ML (25 UNITS) INTO THE SKIN AT BEDTIME 30 mL 5  . lisinopril (ZESTRIL) 5 MG tablet Take 1 tablet (5 mg total) by mouth daily. 90 tablet 0  . metoprolol succinate (TOPROL-XL) 25 MG 24 hr tablet Take 1 tablet (25 mg total) by mouth daily. 90 tablet 1  . TRUEPLUS INSULIN SYRINGE 31G X 5/16" 0.5 ML MISC USE TO  INJECT INSULIN 4 TIMES A DAY 300 each 3   No facility-administered medications prior to visit.     Allergies:   Other   Past Medical History:  Diagnosis Date  . Allergy   . Diabetes mellitus   . GERD (gastroesophageal reflux disease)   . History of chicken pox   . Hyperlipidemia   . Hypertension   . Myocardial infarction (HCC) 2017  . Type 2 diabetes mellitus with hyperglycemia, with long-term current use of insulin (HCC) 09/08/2015    Past Surgical History:  Procedure Laterality Date  . EYE SURGERY  1998   bilateral  . SHOULDER SURGERY  1990   right shoulder, torn rotator cuff.     Social History:  The patient  reports that he quit smoking about 55 years ago. His smoking use included cigarettes. He has never used smokeless tobacco. He reports that he does not drink alcohol and does not use drugs.   Family History:  The patient's family history includes Cancer in his mother; Diabetes in his paternal grandfather; Heart disease in his mother; Hyperlipidemia in his father and mother; Hypertension in  his father and mother; Stroke in his father.    ROS:  Please see the history of present illness. All other systems are reviewed and  Negative to the above problem except as noted.    PHYSICAL EXAM: VS:  BP 140/82   Pulse 64   Ht 5' 9.75" (1.772 m)   Wt 174 lb (78.9 kg)   BMI 25.15 kg/m   GEN: Well nourished, well developed, in no acute distress  HEENT: normal  Neck: JVP normal  No carotid bruits, Cardiac: RRR; no murmurs, No LE  edema  Respiratory:  clear to auscultation bilaterally, normal work of breathing GI: soft, nontender, nondistended, + BS  No hepatomegaly  MS: no deformity Moving all extremities   Skin: warm and dry, no rash Neuro:  Strength and sensation are intact Psych: euthymic mood, full affect   EKG:  EKG is t ordered today.   SR 64 bpm  Occsaional PVC  Nonspecific ST changes   Echo:  04/21/20  1. Left ventricular ejection fraction, by estimation, is  60 to 65%. The left ventricle has normal function. The left ventricle has no regional wall motion abnormalities. Left ventricular diastolic parameters are consistent with Grade I diastolic dysfunction (impaired relaxation). 2. Right ventricular systolic function is normal. The right ventricular size is normal. 3. The mitral valve is normal in structure. Mild to moderate mitral valve regurgitation. No evidence of mitral stenosis. 4. The aortic valve is tricuspid. Aortic valve regurgitation is not visualized. No aortic stenosis is present. 5. The inferior vena cava is normal in size with greater than 50% respiratory variability, suggesting right atrial pressure of 3 mmHg. Lipid Panel    Component Value Date/Time   CHOL 119 01/01/2020 1126   TRIG 77.0 01/01/2020 1126   HDL 42.40 01/01/2020 1126   CHOLHDL 3 01/01/2020 1126   VLDL 15.4 01/01/2020 1126   LDLCALC 61 01/01/2020 1126      Wt Readings from Last 3 Encounters:  09/08/20 174 lb (78.9 kg)  07/25/20 170 lb 6.4 oz (77.3 kg)  06/26/20 170 lb (77.1 kg)      ASSESSMENT AND PLAN: 1  Paroxysmal atrial flutter  Pt denies symtpoms (was very symptomatic when had in past)   His CHADSVASC is 3  I would recomm staing on anticoag  2  HTN  BP is higher in office  Log he brings from home is very good  SBP ranges mainly 120s to 130s   COntinue to follow   3  HL  Last lipids LDL 61  HDL 42  Cont lipitor   F?U in the fall  Sooner for problems     Current medicines are reviewed at length with the patient today.  The patient does not have concerns regarding medicines.  Signed, Dietrich Pates, MD  09/08/2020 4:44 PM    [facility] [address] [place], [place], [place]  [address] Phone: [phone]; Fax: [phone]

## 2020-09-09 LAB — CBC
Hematocrit: 43.4 % (ref 37.5–51.0)
Hemoglobin: 14.6 g/dL (ref 13.0–17.7)
MCH: 30.4 pg (ref 26.6–33.0)
MCHC: 33.6 g/dL (ref 31.5–35.7)
MCV: 90 fL (ref 79–97)
Platelets: 255 10*3/uL (ref 150–450)
RBC: 4.81 x10E6/uL (ref 4.14–5.80)
RDW: 12.3 % (ref 11.6–15.4)
WBC: 8.9 10*3/uL (ref 3.4–10.8)

## 2020-09-11 ENCOUNTER — Other Ambulatory Visit: Payer: Self-pay | Admitting: Internal Medicine

## 2020-09-11 MED FILL — ELIQUIS 5 MG TABLET: 5 | 30 days supply | Qty: 60 | Fill #0

## 2020-09-11 NOTE — Telephone Encounter (Signed)
Prescription refill request for Eliquis received.  Indication: Aflutter Last office visit: Tenny Craw, 02/08/2020 Scr: 0.99, 07/25/2020 Age: 75 yo  Weight: 78.9 kg   Prescription refill sent.

## 2020-09-12 NOTE — Telephone Encounter (Signed)
Inbound fax from [employer] requesting missing information be provided. Form completed and faxed back. Awaiting determination.

## 2020-09-22 NOTE — Progress Notes (Signed)
Chronic Care Management Pharmacy Note  09/25/2020 Name:  Ryan [name] MRN:  [id number] DOB:  [birth year]  Subjective: Ryan [name] is an 75 y.o. year old male who is a primary patient of [name], NP.  The CCM team was consulted for assistance with disease management and care coordination needs.    Engaged with patient by telephone for follow up visit in response to provider referral for pharmacy case management and/or care coordination services.   Consent to Services:  The patient was given the following information about Chronic Care Management services today, agreed to services, and gave verbal consent: 1. CCM service includes personalized support from designated clinical staff supervised by the primary care provider, including individualized plan of care and coordination with other care providers 2. 24/7 contact phone numbers for assistance for urgent and routine care needs. 3. Service will only be billed when office clinical staff spend 20 minutes or more in a month to coordinate care. 4. Only one practitioner may furnish and bill the service in a calendar month. 5.The patient may stop CCM services at any time (effective at the end of the month) by phone call to the office staff. 6. The patient will be responsible for cost sharing (co-pay) of up to 20% of the service fee (after annual deductible is met). Patient agreed to services and consent obtained.  Patient Care Team: [name], NP as PCP - General (Internal Medicine) Fay Records, MD as PCP - Cardiology (Cardiology) [name], MD as Consulting Physician (Endocrinology) Fay Records, MD as Consulting Physician (Cardiology) Day, Melvenia Beam, [facility] (Inactive) as Pharmacist (Pharmacist)  Recent office visits: 07/25/2020 [name]) - regular follow up no medication changes.  Denies any recent hypoglycemia.  Recent consult visits: 09/08/20 [name], Cardio) - regular follow up, BP good at home, no medication  changes  Hospital visits: None in previous 6 months  Social Hx: Gets meds delivered from [facility] in [place] x 2yrs. Wife passed away November 02, 2014 due to ovarian cancer. Son and wife live with him and drive him around. 2 grandkids 22 and 18.  Was informed he could get his license back. He is going to set up appt.   On list to get COVID vaccine.  Objective:  Lab Results  Component Value Date   CREATININE 0.99 07/25/2020   BUN 15 07/25/2020   GFR 75.20 07/25/2020   GFRNONAA 68 04/04/2017   GFRAA 79 04/04/2017   NA 133 (L) 07/25/2020   K 4.3 07/25/2020   CALCIUM 9.3 07/25/2020   CO2 28 07/25/2020    Lab Results  Component Value Date/Time   HGBA1C 6.5 (A) 06/26/2020 03:07 PM   HGBA1C 6.7 (A) 02/19/2020 03:05 PM   HGBA1C 7.2 (H) 10/09/2019 01:15 PM   HGBA1C 7.9 (H) 01/27/2016 03:18 PM   FRUCTOSAMINE 285 (H) 06/10/2016 12:16 PM   FRUCTOSAMINE 252 03/09/2016 04:05 PM   GFR 75.20 07/25/2020 11:18 AM   GFR 67.62 01/01/2020 11:26 AM   MICROALBUR 0.9 10/09/2019 01:15 PM   MICROALBUR <0.7 01/11/2018 11:03 AM    Last diabetic Eye exam:  Lab Results  Component Value Date/Time   HMDIABEYEEXA No Retinopathy 01/24/2020 12:00 AM    Last diabetic Foot exam: No results found for: HMDIABFOOTEX   Lab Results  Component Value Date   CHOL 119 01/01/2020   HDL 42.40 01/01/2020   LDLCALC 61 01/01/2020   TRIG 77.0 01/01/2020   CHOLHDL 3 01/01/2020    Hepatic Function Latest Ref Rng &  Units 04/06/2019 08/10/2017 02/11/2016  Total Protein 6.0 - 8.3 g/dL 7.0 7.8 6.6  Albumin 3.5 - 5.2 g/dL 3.9 4.1 3.5  AST 0 - 37 U/L $Remo'13 16 29  'Abjlf$ ALT 0 - 53 U/L 11 14 33  Alk Phosphatase 39 - 117 U/L 74 69 67  Total Bilirubin 0.2 - 1.2 mg/dL 0.8 1.2 0.9  Bilirubin, Direct 0.0 - 0.3 mg/dL - - -    Lab Results  Component Value Date/Time   TSH 2.335 01/30/2016 04:08 AM   TSH 4.110 01/27/2016 09:05 AM    CBC Latest Ref Rng & Units 09/08/2020 10/06/2017 08/10/2017  WBC 3.4 - 10.8  x10E3/uL 8.9 7.5 9.8  Hemoglobin 13.0 - 17.7 g/dL 14.6 14.0 15.4  Hematocrit 37.5 - 51.0 % 43.4 41.3 46.7  Platelets 150 - 450 x10E3/uL 255 - 227.0    No results found for: VD25OH  Clinical ASCVD: Yes  The ASCVD Risk score [name], et al., 2013) failed to calculate for the following reasons:   The valid total cholesterol range is 130 to 320 mg/dL    Depression screen [name] 2/9 01/28/2020 01/21/2020 03/06/2019  Decreased Interest 0 0 0  Down, Depressed, Hopeless 0 0 0  PHQ - 2 Score 0 0 0  Some recent data might be hidden     CHA2DS2-VASc Score =   3 {     Social History   Tobacco Use  Smoking Status Former Smoker  . Types: Cigarettes  . Quit date: 09/01/1965  . Years since quitting: 55.1  Smokeless Tobacco Never Used   BP Readings from Last 3 Encounters:  09/08/20 140/82  07/25/20 140/85  06/26/20 110/70   Pulse Readings from Last 3 Encounters:  09/08/20 64  07/25/20 (!) 59  06/26/20 68   Wt Readings from Last 3 Encounters:  09/08/20 174 lb (78.9 kg)  07/25/20 170 lb 6.4 oz (77.3 kg)  06/26/20 170 lb (77.1 kg)    Assessment/Interventions: Review of patient past medical history, allergies, medications, health status, including review of consultants reports, laboratory and other test data, was performed as part of comprehensive evaluation and provision of chronic care management services.   SDOH:  (Social Determinants of Health) assessments and interventions performed: No   CCM Care Plan  Allergies  Allergen Reactions  . Other Other (See Comments)    Client states he became very sick and had significant skin inflammation and irritation with the [facility] sensor    Medications Reviewed Today    Reviewed by Edythe Clarity, [facility] (Pharmacist) on 09/25/20 at [place] List Status: <None>  Medication Order Taking? Sig Documenting Provider Last Dose Status Informant  atorvastatin (LIPITOR) 40 MG tablet [id number] Yes Take 1 tablet (40 mg total) by mouth  daily. [name], NP Taking Active   Calcium Carbonate-Vitamin D 600-400 MG-UNIT tablet [id number] Yes Take 1 tablet by mouth 2 (two) times daily. Reported on 01/27/2016 [provider] Taking Active Self  cholecalciferol (VITAMIN D3) 25 MCG (1000 UNIT) tablet [id number] Yes Take 1,000 Units by mouth daily. [provider] Taking Active Self  ELIQUIS 5 MG TABS tablet [id number] Yes TAKE 1 TABLET BY MOUTH TWICE DAILY Fay Records, MD Taking Active   furosemide (LASIX) 20 MG tablet [id number] Yes Take 1 tablet (20 mg total) by mouth daily. [name], NP Taking Active   glucagon [facility]) 1 MG injection [id number] Yes Inject 1 mg into the muscle once as needed for up to 1 dose. [name],  MD Taking Active   insulin aspart (NOVOLOG) 100 UNIT/ML injection [id number] Yes INJECT 5-8 UNITS INTO THE SKIN 3 TIMES A DAY WITH MEALS [name], MD Taking Active   insulin detemir (LEVEMIR) 100 UNIT/ML injection [id number] Yes INJECT 0.25 ML (25 UNITS) INTO THE SKIN AT BEDTIME [name], MD Taking Active   lisinopril (ZESTRIL) 5 MG tablet [id number] Yes Take 1 tablet (5 mg total) by mouth daily. [name], NP Taking Active   metoprolol succinate (TOPROL-XL) 25 MG 24 hr tablet [id number] Yes Take 1 tablet (25 mg total) by mouth daily. [name], NP Taking Active   TRUEPLUS INSULIN SYRINGE 31G X 5/16" 0.5 ML MISC [id number] Yes USE TO INJECT INSULIN 4 TIMES A [name], MD Taking Active           Patient Active Problem List   Diagnosis Date Noted  . Chronic diastolic CHF (congestive heart failure) ([place]) 07/27/2020  . Moderate mitral regurgitation 07/27/2020  . Left foot pain 04/12/2019  . Overweight (BMI 25.0-29.9) 01/12/2019  . Atrial flutter ([place]) 01/27/2016  . Elevated troponin 01/27/2016  . Poorly controlled type 2 diabetes mellitus with circulatory disorder ([place]) 09/08/2015  . Preventative health care 02/05/2015   . Benign paroxysmal positional vertigo 01/15/2014  . Skin lesion of left arm 05/11/2013  . Osteopenia 06/09/2011  . Hyperbilirubinemia 05/26/2011  . HTN (hypertension) 02/22/2011  . Hyperlipidemia 02/22/2011  . GERD (gastroesophageal reflux disease) 02/22/2011  . Allergic rhinitis 02/22/2011    Immunization History  Administered Date(s) Administered  . Fluad Quad(high Dose 65+) 05/15/2019, 04/25/2020  . H1N1 11/19/2008  . Influenza Split 05/25/2011, 04/20/2012  . Influenza Whole 06/06/2009  . Influenza, High Dose Seasonal PF 04/20/2016, 05/12/2017, 05/26/2018  . Influenza,inj,Quad PF,6+ Mos 05/07/2013, 05/27/2014, 05/13/2015  . PFIZER(Purple Top)SARS-COV-2 Vaccination 09/30/2019, 10/24/2019, 07/22/2020  . Pneumococcal Conjugate-13 10/19/2004, 05/11/2013  . Pneumococcal Polysaccharide-23 05/25/2011, 09/08/2018  . Td 08/09/2001, 08/25/2011  . Zoster 08/25/2011    Conditions to be addressed/monitored:  Afib/flutter, DM, HTN, HLD, GERD, Osteopenia, Gout   Care Plan : General Pharmacy (Adult)  Updates made by Edythe Clarity, RPH since 09/25/2020 12:00 AM    Problem: HTN, HLD, Afib, DM   Priority: High  Onset Date: 09/25/2020    Long-Range Goal: Patient-Specific Goal   Start Date: 09/25/2020  Expected End Date: 03/25/2021  This Visit's Progress: On track  Priority: High  Note:   Current Barriers:  . Unable to independently afford treatment regimen - assistance in progress for Eliquis and Levemir  Pharmacist Clinical Goal(s):  Marland Kitchen Over the next 180 days, patient will verbalize ability to afford treatment regimen . maintain control of blood sugar and blood pressure as evidenced by home monitoring  . contact provider office for questions/concerns as evidenced notation of same in electronic health record through collaboration with PharmD and provider.    Interventions: . 1:1 collaboration with [name], NP regarding development and update of comprehensive plan of  care as evidenced by provider attestation and co-signature . Inter-disciplinary care team collaboration (see longitudinal plan of care) . Comprehensive medication review performed; medication list updated in electronic medical record  Hypertension (BP goal <130/80) -controlled -Current treatment:  Lisinopril 5mg  daily  Metoprolol succinate 25mg  daily -Medications previously tried: amlodipine (unsure why d/c)  -Current home readings: 123/76 P58, most around here or max 130/80s  -Denies hypotensive/hypertensive symptoms -Educated on BP goals and benefits of medications for prevention of heart attack, stroke and kidney damage; Exercise goal of 150 minutes per week; Importance  of home blood pressure monitoring; -Counseled to monitor BP at home periodically, document, and provide log at future appointments -Recommended to continue current medication  Hyperlipidemia: (LDL goal < 70) -controlled -Current treatment:  Atorvastatin 40mg  daily -Medications previously tried: pravastatin -Current exercise habits: 2-3 miles every other day -Educated on Cholesterol goals;  Benefits of statin for ASCVD risk reduction; -Recommended to continue current medication  Diabetes (A1c goal <7%) -controlled -Current medications:  Novolog 5-8 units TID w/ meals  Levemir 25 units HS -Medications previously tried: metformin (GI)  -Current home glucose readings . fasting glucose: 114, 99, 75, 83, 98, 140 . post prandial glucose: None noted -Denies hypoglycemic/hyperglycemic symptoms  -Current exercise: see above -Educated onA1c and blood sugar goals; Prevention and management of hypoglycemic episodes; -Counseled to check feet daily and get yearly eye exams -Recommended to continue current medication  Atrial Fibrillation (Goal: prevent stroke and major bleeding) -controlled  -CHADSVASC: 3 -Current treatment: . Rate control: Metoprolol succinate 25mg  daily . Anticoagulation: Eliquis 5mg  twice  daily -Medications previously tried: none noted -Home BP and HR readings: HR 58  BP 123/76  -Counseled on bleeding risk associated with Eliquis and importance of self-monitoring for signs/symptoms of bleeding; -Assessed patient finances. Discussed availability of PAP program for Eliquis if copay becomes a burden Recommended continue current medications   Patient Goals/Self-Care Activities . Over the next 180 days, patient will:  - take medications as prescribed check glucose daily, document, and provide at future appointments collaborate with provider on medication access solutions  Follow Up Plan: The care management team will reach out to the patient again over the next 180 days.       Medication Assistance: Application for Levemir renewall, Eliquis   medication assistance program. in process.  Anticipated assistance start date in progress.  See plan of care for additional detail.  Patient's preferred pharmacy is:  [facility], [address] [place] [address] Phone: [phone] Fax: [facility], [place] - [address] [place] [address] Phone: [phone] Fax: [phone], [facility] (New Address) - [place], [address] [place] AT Previously: [address], [address] [place] [address] Phone: [phone] Fax: [phone]  Uses pill box? No Pt endorses 100% compliance  We discussed: Benefits of medication synchronization, packaging and delivery as well as enhanced pharmacist oversight with [employer]. Patient decided to: Continue current medication management strategy  Care Plan and Follow Up Patient Decision:  Patient agrees to Care Plan and Follow-up.  Plan: The patient has been provided with contact information  for the care management team and has been advised to call with any health related questions or concerns.   Beverly Milch, PharmD Clinical Pharmacist [place] [phone]

## 2020-09-25 ENCOUNTER — Ambulatory Visit (INDEPENDENT_AMBULATORY_CARE_PROVIDER_SITE_OTHER): Payer: HMO

## 2020-09-25 DIAGNOSIS — E1165 Type 2 diabetes mellitus with hyperglycemia: Secondary | ICD-10-CM | POA: Diagnosis not present

## 2020-09-25 DIAGNOSIS — E1159 Type 2 diabetes mellitus with other circulatory complications: Secondary | ICD-10-CM

## 2020-09-25 DIAGNOSIS — I10 Essential (primary) hypertension: Secondary | ICD-10-CM | POA: Diagnosis not present

## 2020-09-25 DIAGNOSIS — E7800 Pure hypercholesterolemia, unspecified: Secondary | ICD-10-CM | POA: Diagnosis not present

## 2020-09-25 NOTE — Patient Instructions (Addendum)
Visit Information  Goals Addressed            This Visit's Progress   . Manage My Medicine       Timeframe:  Long-Range Goal Priority:  Medium Start Date:     09/25/20                        Expected End Date:   03/25/21                    Follow Up Date 01/06/21   - call for medicine refill 2 or 3 days before it runs out - keep a list of all the medicines I take; vitamins and herbals too - use a pillbox to sort medicine    Why is this important?   . These steps will help you keep on track with your medicines.   Notes: Please notify providers if copays become a burden for you      Patient Care Plan: General Pharmacy (Adult)    Problem Identified: HTN, HLD, Afib, DM   Priority: High  Onset Date: 09/25/2020    Long-Range Goal: Patient-Specific Goal   Start Date: 09/25/2020  Expected End Date: 03/25/2021  This Visit's Progress: On track  Priority: High  Note:   Current Barriers:  . Unable to independently afford treatment regimen - assistance in progress for Eliquis and Levemir  Pharmacist Clinical Goal(s):  Marland Kitchen Over the next 180 days, patient will verbalize ability to afford treatment regimen . maintain control of blood sugar and blood pressure as evidenced by home monitoring  . contact provider office for questions/concerns as evidenced notation of same in electronic health record through collaboration with PharmD and provider.    Interventions: . 1:1 collaboration with Sandford Craze, NP regarding development and update of comprehensive plan of care as evidenced by provider attestation and co-signature . Inter-disciplinary care team collaboration (see longitudinal plan of care) . Comprehensive medication review performed; medication list updated in electronic medical record  Hypertension (BP goal <130/80) -controlled -Current treatment:  Lisinopril 5mg  daily  Metoprolol succinate 25mg  daily -Medications previously tried: amlodipine (unsure why d/c)   -Current home readings: 123/76 P58, most around here or max 130/80s  -Denies hypotensive/hypertensive symptoms -Educated on BP goals and benefits of medications for prevention of heart attack, stroke and kidney damage; Exercise goal of 150 minutes per week; Importance of home blood pressure monitoring; -Counseled to monitor BP at home periodically, document, and provide log at future appointments -Recommended to continue current medication  Hyperlipidemia: (LDL goal < 70) -controlled -Current treatment:  Atorvastatin 40mg  daily -Medications previously tried: pravastatin -Current exercise habits: 2-3 miles every other day -Educated on Cholesterol goals;  Benefits of statin for ASCVD risk reduction; -Recommended to continue current medication  Diabetes (A1c goal <7%) -controlled -Current medications:  Novolog 5-8 units TID w/ meals  Levemir 25 units HS -Medications previously tried: metformin (GI)  -Current home glucose readings . fasting glucose: 114, 99, 75, 83, 98, 140 . post prandial glucose: None noted -Denies hypoglycemic/hyperglycemic symptoms  -Current exercise: see above -Educated onA1c and blood sugar goals; Prevention and management of hypoglycemic episodes; -Counseled to check feet daily and get yearly eye exams -Recommended to continue current medication  Atrial Fibrillation (Goal: prevent stroke and major bleeding) -controlled  -CHADSVASC: 3 -Current treatment: . Rate control: Metoprolol succinate 25mg  daily . Anticoagulation: Eliquis 5mg  twice daily -Medications previously tried: none noted -Home BP and HR readings: HR 58  BP 123/76  -Counseled on bleeding risk associated with Eliquis and importance of self-monitoring for signs/symptoms of bleeding; -Assessed patient finances. Discussed availability of PAP program for Eliquis if copay becomes a burden Recommended continue current medications   Patient Goals/Self-Care Activities . Over the next 180  days, patient will:  - take medications as prescribed check glucose daily, document, and provide at future appointments collaborate with provider on medication access solutions  Follow Up Plan: The care management team will reach out to the patient again over the next 180 days.       The patient verbalized understanding of instructions, educational materials, and care plan provided today and agreed to receive a mailed copy of patient instructions, educational materials, and care plan.  Telephone follow up appointment with pharmacy team member scheduled for: 6 months  Erroll Luna, [facility]  Dyslipidemia Dyslipidemia is an imbalance of waxy, fat-like substances (lipids) in the blood. The body needs lipids in small amounts. Dyslipidemia often involves a high level of cholesterol or triglycerides, which are types of lipids. Common forms of dyslipidemia include:  High levels of LDL cholesterol. LDL is the type of cholesterol that causes fatty deposits (plaques) to build up in the blood vessels that carry blood away from your heart (arteries).  Low levels of HDL cholesterol. HDL cholesterol is the type of cholesterol that protects against heart disease. High levels of HDL remove the LDL buildup from arteries.  High levels of triglycerides. Triglycerides are a fatty substance in the blood that is linked to a buildup of plaques in the arteries. What are the causes? Primary dyslipidemia is caused by changes (mutations) in genes that are passed down through families (inherited). These mutations cause several types of dyslipidemia. Secondary dyslipidemia is caused by lifestyle choices and diseases that lead to dyslipidemia, such as:  Eating a diet that is high in animal fat.  Not getting enough exercise.  Having diabetes, kidney disease, liver disease, or thyroid disease.  Drinking large amounts of alcohol.  Using certain medicines. What increases the risk? You are more likely to develop  this condition if you are an older man or if you are a woman who has gone through menopause. Other risk factors include:  Having a family history of dyslipidemia.  Taking certain medicines, including birth control pills, steroids, some diuretics, and beta-blockers.  Smoking cigarettes.  Eating a high-fat diet.  Having certain medical conditions such as diabetes, polycystic ovary syndrome (PCOS), kidney disease, liver disease, or hypothyroidism.  Not exercising regularly.  Being overweight or obese with too much belly fat. What are the signs or symptoms? In most cases, dyslipidemia does not usually cause any symptoms. In severe cases, very high lipid levels can cause:  Fatty bumps under the skin (xanthomas).  White or gray ring around the black center (pupil) of the eye. Very high triglyceride levels can cause inflammation of the pancreas (pancreatitis). How is this diagnosed? Your health care provider may diagnose dyslipidemia based on a routine blood test (fasting blood test). Because most people do not have symptoms of the condition, this blood testing (lipid profile) is done on adults age 16 and older and is repeated every 5 years. This test checks:  Total cholesterol. This measures the total amount of cholesterol in your blood, including LDL cholesterol, HDL cholesterol, and triglycerides. A healthy number is below 200.  LDL cholesterol. The target number for LDL cholesterol is different for each person, depending on individual risk factors. Ask your health care provider what  your LDL cholesterol should be.  HDL cholesterol. An HDL level of 60 or higher is best because it helps to protect against heart disease. A number below 40 for men or below 50 for women increases the risk for heart disease.  Triglycerides. A healthy triglyceride number is below 150. If your lipid profile is abnormal, your health care provider may do other blood tests.   How is this treated? Treatment  depends on the type of dyslipidemia that you have and your other risk factors for heart disease and stroke. Your health care provider will have a target range for your lipid levels based on this information. For many people, this condition may be treated by lifestyle changes, such as diet and exercise. Your health care provider may recommend that you:  Get regular exercise.  Make changes to your diet.  Quit smoking if you smoke. If diet changes and exercise do not help you reach your goals, your health care provider may also prescribe medicine to lower lipids. The most commonly prescribed type of medicine lowers your LDL cholesterol (statin drug). If you have a high triglyceride level, your provider may prescribe another type of drug (fibrate) or an omega-3 fish oil supplement, or both. Follow these instructions at home: Eating and drinking  Follow instructions from your health care provider or dietitian about eating or drinking restrictions.  Eat a healthy diet as told by your health care provider. This can help you reach and maintain a healthy weight, lower your LDL cholesterol, and raise your HDL cholesterol. This may include: ? Limiting your calories, if you are overweight. ? Eating more fruits, vegetables, whole grains, fish, and lean meats. ? Limiting saturated fat, trans fat, and cholesterol.  If you drink alcohol: ? Limit how much you use. ? Be aware of how much alcohol is in your drink. In the [place] one drink equals one 12 oz bottle of beer (355 mL), one 5 oz glass of wine (148 mL), or one 1 oz glass of hard liquor (44 mL).  Do not drink alcohol if: ? Your health care provider tells you not to drink. ? You are pregnant, may be pregnant, or are planning to become pregnant. Activity  Get regular exercise. Start an exercise and strength training program as told by your health care provider. Ask your health care provider what activities are safe for you. Your health care provider  may recommend: ? 30 minutes of aerobic activity 4-6 days a week. Brisk walking is an example of aerobic activity. ? Strength training 2 days a week. General instructions  Do not use any products that contain nicotine or tobacco, such as cigarettes, e-cigarettes, and chewing tobacco. If you need help quitting, ask your health care provider.  Take over-the-counter and prescription medicines only as told by your health care provider. This includes supplements.  Keep all follow-up visits as told by your health care provider.   Contact a health care provider if:  You are: ? Having trouble sticking to your exercise or diet plan. ? Struggling to quit smoking or control your use of alcohol. Summary  Dyslipidemia often involves a high level of cholesterol or triglycerides, which are types of lipids.  Treatment depends on the type of dyslipidemia that you have and your other risk factors for heart disease and stroke.  For many people, treatment starts with lifestyle changes, such as diet and exercise.  Your health care provider may prescribe medicine to lower lipids. This information is not intended to replace  advice given to you by your health care provider. Make sure you discuss any questions you have with your health care provider. Document Revised: 03/20/2018 Document Reviewed: 02/24/2018 Elsevier Patient Education  2021 [employer].

## 2020-10-03 ENCOUNTER — Ambulatory Visit: Payer: HMO | Admitting: Family

## 2020-10-13 MED FILL — ELIQUIS 5 MG TABLET: 5 | 30 days supply | Qty: 60 | Fill #1

## 2020-10-30 ENCOUNTER — Other Ambulatory Visit: Payer: Self-pay

## 2020-10-30 ENCOUNTER — Encounter: Payer: Self-pay | Admitting: Internal Medicine

## 2020-10-30 ENCOUNTER — Other Ambulatory Visit (HOSPITAL_BASED_OUTPATIENT_CLINIC_OR_DEPARTMENT_OTHER): Payer: Self-pay

## 2020-10-30 ENCOUNTER — Ambulatory Visit (INDEPENDENT_AMBULATORY_CARE_PROVIDER_SITE_OTHER): Payer: HMO | Admitting: Internal Medicine

## 2020-10-30 VITALS — BP 120/82 | HR 70 | Ht 69.75 in | Wt 171.4 lb

## 2020-10-30 DIAGNOSIS — E663 Overweight: Secondary | ICD-10-CM

## 2020-10-30 DIAGNOSIS — E1159 Type 2 diabetes mellitus with other circulatory complications: Secondary | ICD-10-CM

## 2020-10-30 DIAGNOSIS — E785 Hyperlipidemia, unspecified: Secondary | ICD-10-CM | POA: Diagnosis not present

## 2020-10-30 DIAGNOSIS — E1165 Type 2 diabetes mellitus with hyperglycemia: Secondary | ICD-10-CM

## 2020-10-30 LAB — POCT GLYCOSYLATED HEMOGLOBIN (HGB A1C): Hemoglobin A1C: 6.4 % — AB (ref 4.0–5.6)

## 2020-10-30 MED ORDER — INSULIN ASPART 100 UNIT/ML ~~LOC~~ SOLN
SUBCUTANEOUS | 5 refills | Status: DC
Start: 2020-10-30 — End: 2022-05-28

## 2020-10-30 MED ORDER — INSULIN DETEMIR 100 UNIT/ML ~~LOC~~ SOLN
SUBCUTANEOUS | 5 refills | Status: DC
Start: 2020-10-30 — End: 2021-02-26
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
## 2020-10-30 NOTE — Addendum Note (Signed)
Addended by: Kenyon Ana on: 10/30/2020 02:28 PM   Modules accepted: Orders

## 2020-10-30 NOTE — Patient Instructions (Addendum)
Please decrease: - Levemir 18 units at bedtime - Novolog mealtime 5-6 units 3x a day before meals  - Novolog sliding scale as follows:  - 150-175: + 1 unit  - 176-200: + 2 units  - 201-225: + 3 units  - 226-250: + 4 units  - >250: + 5 units  Let me know if you have any more lows at night.  Please return in 4 months with your sugar log.

## 2020-11-06 ENCOUNTER — Other Ambulatory Visit: Payer: Self-pay | Admitting: Family

## 2020-11-06 DIAGNOSIS — I10 Essential (primary) hypertension: Secondary | ICD-10-CM

## 2020-11-06 MED FILL — FUROSEMIDE 20 MG TAB: 20 | 90 days supply | Qty: 90 | Fill #1

## 2020-11-06 MED FILL — LISINOPRIL 5 MG TABLET: 5 | 90 days supply | Qty: 90 | Fill #0

## 2020-11-06 MED FILL — ATORVASTATIN CALCIUM 40 MG: 40 | 90 days supply | Qty: 90 | Fill #1

## 2020-11-06 MED FILL — METOPROLOL SUCCINATE ER 25: 25 | 90 days supply | Qty: 90 | Fill #1

## 2020-11-11 ENCOUNTER — Other Ambulatory Visit (HOSPITAL_BASED_OUTPATIENT_CLINIC_OR_DEPARTMENT_OTHER): Payer: Self-pay

## 2020-11-11 MED FILL — Apixaban Tab 5 MG: ORAL | 30 days supply | Qty: 60 | Fill #0 | Status: AC

## 2020-11-24 ENCOUNTER — Other Ambulatory Visit (HOSPITAL_BASED_OUTPATIENT_CLINIC_OR_DEPARTMENT_OTHER): Payer: Self-pay

## 2020-11-24 ENCOUNTER — Telehealth: Payer: Self-pay

## 2020-11-24 NOTE — Chronic Care Management (AMB) (Signed)
    Chronic Care Management Pharmacy Assistant   Name: Ryan [name]  MRN: [id number] DOB: 03-Nov-1945  Reason for Encounter: Novolog/Levemir/ Eliquis Patient Assistance   Medications: Outpatient Encounter Medications as of 11/24/2020  Medication Sig  . apixaban (ELIQUIS) 5 MG TABS tablet TAKE 1 TABLET BY MOUTH TWICE DAILY  . atorvastatin (LIPITOR) 40 MG tablet TAKE 1 TABLET (40 MG TOTAL) BY MOUTH DAILY.  . Calcium Carbonate-Vitamin D 600-400 MG-UNIT tablet Take 1 tablet by mouth 2 (two) times daily. Reported on 01/27/2016  . cholecalciferol (VITAMIN D3) 25 MCG (1000 UNIT) tablet Take 1,000 Units by mouth daily.  . furosemide (LASIX) 20 MG tablet TAKE 1 TABLET (20 MG TOTAL) BY MOUTH DAILY.  Marland Kitchen glucagon (GLUCAGON EMERGENCY) 1 MG injection Inject 1 mg into the muscle once as needed for up to 1 dose.  . insulin aspart (NOVOLOG) 100 UNIT/ML injection INJECT 5-6 UNITS INTO THE SKIN 3 TIMES A DAY WITH MEALS  . insulin detemir (LEVEMIR) 100 UNIT/ML injection INJECT 0.18 ML (18 UNITS) INTO THE SKIN AT BEDTIME  . lisinopril (ZESTRIL) 5 MG tablet TAKE 1 TABLET (5 MG TOTAL) BY MOUTH DAILY.  . metoprolol succinate (TOPROL-XL) 25 MG 24 hr tablet TAKE 1 TABLET (25 MG TOTAL) BY MOUTH DAILY.  Marland Kitchen TRUEPLUS INSULIN SYRINGE 31G X 5/16" 0.5 ML MISC USE TO INJECT INSULIN 4 TIMES A DAY   No facility-administered encounter medications on file as of 11/24/2020.   Called and spoke with patient. Patient informed me that he has not received novolog or levemir through patient assistance as yet. He also informed me that his oop for eliquis is bearable when he is not in the donut hole. He stated he pays $45/ month for eliquis.  Spoke with Trenice at [employer], they will need proof of income from [name] as well as new directions for novolog faxed over.  Spoke with [facility] to inquire about OOP cost for eliquis, [name] will not qualify for Eliquis patient assistance until June/july.  I informed [name]  that he would need to bring his proof of income information, for Novo, to [facility] to have it faxed over.   CPP notified of missing documentation  Purvis Kilts CPA, CMA

## 2020-11-26 ENCOUNTER — Ambulatory Visit (INDEPENDENT_AMBULATORY_CARE_PROVIDER_SITE_OTHER): Payer: HMO | Admitting: Pharmacist

## 2020-11-26 DIAGNOSIS — E11649 Type 2 diabetes mellitus with hypoglycemia without coma: Secondary | ICD-10-CM | POA: Diagnosis not present

## 2020-11-26 DIAGNOSIS — I4892 Unspecified atrial flutter: Secondary | ICD-10-CM

## 2020-11-26 DIAGNOSIS — I10 Essential (primary) hypertension: Secondary | ICD-10-CM | POA: Diagnosis not present

## 2020-11-26 DIAGNOSIS — E7800 Pure hypercholesterolemia, unspecified: Secondary | ICD-10-CM

## 2020-11-26 NOTE — Chronic Care Management (AMB) (Signed)
Chronic Care Management Pharmacy Note  11/26/2020 Name:  Ryan [name] MRN:  [id number] DOB:  [birth year]  Subjective: Ryan [name] is an 75 y.o. year old male who is a primary patient of [name], NP.  The CCM team was consulted for assistance with disease management and care coordination needs.    Collaboration with [employer] Patient Assistance Program and with [facility] pharmacy assistant for follow up visit in response to provider referral for pharmacy case management and/or care coordination services.   Consent to Services:  The patient was given information about Chronic Care Management services, agreed to services, and gave verbal consent prior to initiation of services.  Please see initial visit note for detailed documentation.   Patient Care Team: [name], NP as PCP - General (Internal Medicine) Fay Records, MD as PCP - Cardiology (Cardiology) [name], MD as Consulting Physician (Endocrinology) Fay Records, MD as Consulting Physician (Cardiology) Cherre Robins, PharmD (Pharmacist)  Recent office visits: 07/25/2021 - PCP [name]) - f/u chronic conditions; Labs ordered and CT ordered due to abnormal extraocular movements; no med changes noted  Recent consult visits: 10/30/2020 - Endo (Dr Cruzita Lederer) - Reviewed his BG log. BG mostly at goal but they are usually lower after meals compared to before meals.  Decreased NovoLog before each meal to 5 to 6 units.  Also, since he was having low BG during night, advised him to reduce Levemir from 25 to 18 units nightly.  09/08/2020 - Cardio (Dr [name]) - F/U paroxysmalatrial flutter and HTN; no medication changes noted.   Hospital visits: None in previous 6 months  Objective:  Lab Results  Component Value Date   CREATININE 0.99 07/25/2020   CREATININE 1.08 04/25/2020   CREATININE 1.07 01/01/2020    Lab Results  Component Value Date   HGBA1C 6.4 (A) 10/30/2020   Last diabetic Eye exam:   Lab Results  Component Value Date/Time   HMDIABEYEEXA No Retinopathy 01/24/2020 12:00 AM    Last diabetic Foot exam: No results found for: HMDIABFOOTEX      Component Value Date/Time   CHOL 119 01/01/2020 1126   TRIG 77.0 01/01/2020 1126   HDL 42.40 01/01/2020 1126   CHOLHDL 3 01/01/2020 1126   VLDL 15.4 01/01/2020 1126   LDLCALC 61 01/01/2020 1126    Hepatic Function Latest Ref Rng & Units 04/06/2019 08/10/2017 02/11/2016  Total Protein 6.0 - 8.3 g/dL 7.0 7.8 6.6  Albumin 3.5 - 5.2 g/dL 3.9 4.1 3.5  AST 0 - 37 U/L $Remo'13 16 29  'zfnGH$ ALT 0 - 53 U/L 11 14 33  Alk Phosphatase 39 - 117 U/L 74 69 67  Total Bilirubin 0.2 - 1.2 mg/dL 0.8 1.2 0.9  Bilirubin, Direct 0.0 - 0.3 mg/dL - - -    Lab Results  Component Value Date/Time   TSH 2.335 01/30/2016 04:08 AM   TSH 4.110 01/27/2016 09:05 AM    CBC Latest Ref Rng & Units 09/08/2020 10/06/2017 08/10/2017  WBC 3.4 - 10.8 x10E3/uL 8.9 7.5 9.8  Hemoglobin 13.0 - 17.7 g/dL 14.6 14.0 15.4  Hematocrit 37.5 - 51.0 % 43.4 41.3 46.7  Platelets 150 - 450 x10E3/uL 255 - 227.0    No results found for: VD25OH  Clinical ASCVD: No  The ASCVD Risk score [name], et al., 2013) failed to calculate for the following reasons:   The valid total cholesterol range is 130 to 320 mg/dL    Other: CHADS2VASc = 3  Social History  Tobacco Use  Smoking Status Former Smoker  . Types: Cigarettes  . Quit date: 09/01/1965  . Years since quitting: 55.2  Smokeless Tobacco Never Used   BP Readings from Last 3 Encounters:  10/30/20 120/82  09/08/20 140/82  07/25/20 140/85   Pulse Readings from Last 3 Encounters:  10/30/20 70  09/08/20 64  07/25/20 (!) 59   Wt Readings from Last 3 Encounters:  10/30/20 171 lb 6.4 oz (77.7 kg)  09/08/20 174 lb (78.9 kg)  07/25/20 170 lb 6.4 oz (77.3 kg)    Assessment: Review of patient past medical history, allergies, medications, health status, including review of consultants reports, laboratory and other test data, was  performed as part of comprehensive evaluation and provision of chronic care management services.   SDOH:  (Social Determinants of Health) assessments and interventions performed:    CCM Care Plan  Allergies  Allergen Reactions  . Other Other (See Comments)    Client states he became very sick and had significant skin inflammation and irritation with the Freestyle Libre sensor    Medications Reviewed Today    Reviewed by [name], MD (Physician) on 10/30/20 at 1420  Med List Status: <None>  Medication Order Taking? Sig Documenting Provider Last Dose Status Informant  atorvastatin (LIPITOR) 40 MG tablet [id number] Yes Take 1 tablet (40 mg total) by mouth daily. [name], NP Taking Active   Calcium Carbonate-Vitamin D 600-400 MG-UNIT tablet [id number] Yes Take 1 tablet by mouth 2 (two) times daily. Reported on 01/27/2016 [provider] Taking Active Self  cholecalciferol (VITAMIN D3) 25 MCG (1000 UNIT) tablet [id number] Yes Take 1,000 Units by mouth daily. [provider] Taking Active Self  ELIQUIS 5 MG TABS tablet [id number] Yes TAKE 1 TABLET BY MOUTH TWICE DAILY Fay Records, MD Taking Active   furosemide (LASIX) 20 MG tablet [id number] Yes Take 1 tablet (20 mg total) by mouth daily. [name], NP Taking Active   glucagon [facility]) 1 MG injection [id number] Yes Inject 1 mg into the muscle once as needed for up to 1 dose. [name], MD Taking Active   insulin aspart (NOVOLOG) 100 UNIT/ML injection [id number]  INJECT 5-6 UNITS INTO THE SKIN 3 TIMES A DAY WITH MEALS [name], MD  Active   insulin detemir (LEVEMIR) 100 UNIT/ML injection [id number]  INJECT 0.18 ML (18 UNITS) INTO THE SKIN AT BEDTIME [name], MD  Active   lisinopril (ZESTRIL) 5 MG tablet [id number] Yes Take 1 tablet (5 mg total) by mouth daily. [name], NP Taking Active   metoprolol succinate (TOPROL-XL) 25 MG 24 hr tablet [id number] Yes Take 1  tablet (25 mg total) by mouth daily. [name], NP Taking Active   TRUEPLUS INSULIN SYRINGE 31G X 5/16" 0.5 ML MISC [id number] Yes USE TO INJECT INSULIN 4 TIMES A [name], MD Taking Active           Patient Active Problem List   Diagnosis Date Noted  . Chronic diastolic CHF (congestive heart failure) ([place]) 07/27/2020  . Moderate mitral regurgitation 07/27/2020  . Left foot pain 04/12/2019  . Overweight (BMI 25.0-29.9) 01/12/2019  . Atrial flutter ([place]) 01/27/2016  . Elevated troponin 01/27/2016  . Poorly controlled type 2 diabetes mellitus with circulatory disorder ([place]) 09/08/2015  . Preventative health care 02/05/2015  . Benign paroxysmal positional vertigo 01/15/2014  . Skin lesion of left arm 05/11/2013  . Osteopenia 06/09/2011  . Hyperbilirubinemia 05/26/2011  . HTN (hypertension) 02/22/2011  . Hyperlipidemia 02/22/2011  .  GERD (gastroesophageal reflux disease) 02/22/2011  . Allergic rhinitis 02/22/2011    Immunization History  Administered Date(s) Administered  . Fluad Quad(high Dose 65+) 05/15/2019, 04/25/2020  . H1N1 11/19/2008  . Influenza Split 05/25/2011, 04/20/2012  . Influenza Whole 06/06/2009  . Influenza, High Dose Seasonal PF 04/20/2016, 05/12/2017, 05/26/2018  . Influenza,inj,Quad PF,6+ Mos 05/07/2013, 05/27/2014, 05/13/2015  . PFIZER(Purple Top)SARS-COV-2 Vaccination 09/30/2019, 10/24/2019, 07/22/2020  . Pneumococcal Conjugate-13 10/19/2004, 05/11/2013  . Pneumococcal Polysaccharide-23 05/25/2011, 09/08/2018  . Td 08/09/2001, 08/25/2011  . Zoster 08/25/2011    Conditions to be addressed/monitored: HTN, DMII and HDL and atrial flutter  Care Plan : General Pharmacy (Adult)  Updates made by Cherre Robins, PHARMD since 11/26/2020 12:00 AM    Problem: HTN, HLD, Afib, DM   Priority: High  Onset Date: 09/25/2020    Long-Range Goal: Patient-Specific Goal   Start Date: 09/25/2020  Expected End Date: 03/25/2021  Recent Progress: On  track  Priority: High  Note:   Current Barriers:  . Unable to independently afford treatment regimen - assistance in progress for Eliquis and Levemir  Pharmacist Clinical Goal(s):  Marland Kitchen Over the next 180 days, patient will verbalize ability to afford treatment regimen . maintain control of blood sugar and blood pressure as evidenced by home monitoring  . contact provider office for questions/concerns as evidenced notation of same in electronic health record through collaboration with PharmD and provider.    Interventions: . 1:1 collaboration with [name], NP regarding development and update of comprehensive plan of care as evidenced by provider attestation and co-signature . Inter-disciplinary care team collaboration (see longitudinal plan of care) . Comprehensive medication review performed; medication list updated in electronic medical record  Hypertension (BP goal <130/80) -controlled -Current treatment:  Lisinopril 5mg  daily  Metoprolol succinate 25mg  daily -Medications previously tried: amlodipine (unsure why d/c)  -Current home readings: 123/76 P58, most around here or max 130/80s  -Denies hypotensive/hypertensive symptoms -Educated on BP goals and benefits of medications for prevention of heart attack, stroke and kidney damage; Exercise goal of 150 minutes per week; Importance of home blood pressure monitoring; -Counseled to monitor BP at home periodically, document, and provide log at future appointments -Recommended to continue current medication  Hyperlipidemia: (LDL goal < 70) -controlled -Current treatment:  Atorvastatin 40mg  daily -Medications previously tried: pravastatin -Current exercise habits: 2-3 miles every other day -Educated on Cholesterol goals;  Benefits of statin for ASCVD risk reduction; -Recommended to continue current medication  Diabetes (A1c goal <7%) -controlled -Current medications:  Novolog 5-8 units TID w/ meals  Levemir 25  units HS -Medications previously tried: metformin (GI)  -Denies hypoglycemic/hyperglycemic symptoms  -Current exercise: see above -Educated onA1c and blood sugar goals; Prevention and management of hypoglycemic episodes; -Counseled to check feet daily and get yearly eye exams -Recommended to continue current medication - Working on patient assistance application; need patient to supply income information  Atrial Fibrillation (Goal: prevent stroke and major bleeding) -controlled  -CHADSVASC: 3 -Current treatment: . Rate control: Metoprolol succinate 25mg  daily . Anticoagulation: Eliquis 5mg  twice daily -Medications previously tried: none noted -Home BP and HR readings: HR 58  BP 123/76  -Counseled on bleeding risk associated with Eliquis and importance of self-monitoring for signs/symptoms of bleeding; -Assessed patient finances. Discussed availability of PAP program for Eliquis; plan to reassess eligibility in June or July since patient has to spend 3% OOP to qualify for Eliquis PAP -Recommended continue current medications   Patient Goals/Self-Care Activities . Over the next 180 days, patient will:  -  take medications as prescribed check glucose daily, document, and provide at future appointments collaborate with provider on medication access solutions  Follow Up Plan: The care management team will reach out to the patient again over the next 180 days.      Medication Assistance: Application for Levemir and Novolog  medication assistance program. in process.  Anticipated assistance start date 01/07/221.  See plan of care for additional detail.  Plan to follow up in June or July to see if patient has spent 3% of income OOP on meds which is needed to qualify for Eliquis assistance program  Patient's preferred pharmacy is:  [facility] [address] [place] [address] Phone: [phone] Fax: [place] [address] Phone: [phone] Fax: [phone]  [facility] (New [facility] - [place], [place] - [place] AT Previously: [address] [place] [address] Phone: [phone] Fax: [phone]   Follow Up:  Patient agrees to Care Plan and Follow-up.  Plan: Telephone follow up appointment with care management team member scheduled for:  3 months  Cherre Robins, PharmD Clinical Pharmacist [facility]

## 2020-11-26 NOTE — Patient Instructions (Signed)
Visit Information  PATIENT GOALS: Goals Addressed            This Visit's Progress   . Chronic Care Management Pharmacy Care Plan   On track    CARE PLAN ENTRY (see longitudinal plan of care for additional care plan information)  Current Barriers:  . Chronic Disease Management support, education, and care coordination needs related to Afib/flutter, DM, HTN, HLD, GERD, Osteopenia, Gout   Hypertension BP Readings from Last 3 Encounters:  02/19/20 120/88  02/08/20 130/80  01/28/20 133/79   . Pharmacist Clinical Goal(s): o Over the next 90 days, patient will work with PharmD and providers to maintain BP goal <140/90 . Current regimen:   Lisinopril 5mg  daily  Metoprolol succinate 25mg  daily . Patient self care activities - Over the next 90 days, patient will: o Check blood pressure daily, document, and provide at future appointments o Ensure daily salt intake < 2300 mg/day  Hyperlipidemia Lab Results  Component Value Date/Time   LDLCALC 61 01/01/2020 11:26 AM   . Pharmacist Clinical Goal(s): o Over the next 90 days, patient will work with PharmD and providers to maintain LDL goal < 70 . Current regimen:  o Atorvastatin 40mg  daily . Patient self care activities - Over the next 90 days, patient will: o Maintain cholesterol medication regimen.   Diabetes Lab Results  Component Value Date/Time   HGBA1C 6.7 (A) 02/19/2020 03:05 PM   HGBA1C 7.2 (H) 10/09/2019 01:15 PM   HGBA1C 6.8 (A) 01/12/2019 10:17 AM   HGBA1C 7.9 (H) 01/27/2016 03:18 PM   . Pharmacist Clinical Goal(s): o Over the next 90 days, patient will work with PharmD and providers to maintain A1c goal <7% . Current regimen:   Novolog 5 to 8  units 3 times a day w/ meals  Levemir 25 units at bedtime . Interventions: o Attempted to get patient set up with Dexcom, but patient concerned with possible allergic reaction o Coordinated completion of patient assistance for 12/09/2019 - Novolog and Levemir;  Contacted PAP to see what information is missing - Needs patient income information; Verified dose of Novolog per above.  . Patient self care activities - Over the next 90 days, patient will: o Check blood sugar 3-4 times daily, document, and provide at future appointments o Contact provider with any episodes of hypoglycemia o Bring in income documentation to office for patient assistance program  Atrial Flutter:  . Pharmacist Clinical Goal(s) o Over the next 90 days, patient will work with PharmD and providers to prevent stroke and control heart rate . Current treatment:  o Eliquis 5mg  twice a day o Metoprolol succinate 25mg  daily . Interventions: o Patient is not eligible for patient assistance program at this time - has to spend 3% of income in 2022. Will plan to follow up with patient in June or July to assess eligibility again . Patient self care activities - Over the next 90 days, patient will: o Continue current therapy for atrial fibrillation   Health Maintenance  . Pharmacist Clinical Goal(s) o Over the next 90 days, patient will work with PharmD and providers to complete health maintenance screenings/vaccinations . Interventions: o Discussed completing Shingrix vaccine series . Patient self care activities - Over the next 90 days, patient will: o Complete Shingrix vaccine series    Medication management . Pharmacist Clinical Goal(s): o Over the next 90 days, patient will work with PharmD and providers to maintain optimal medication adherence . Current pharmacy: Outpatient Pharmacy .  Interventions o Comprehensive medication review performed. o Continue current medication management strategy . Patient self care activities - Over the next 90 days, patient will: o Focus on medication adherence by filling and taking medications appropriately  o Take medications as prescribed o Report any questions or concerns to PharmD and/or provider(s)  Please see past updates  related to this goal by clicking on the "Past Updates" button in the selected goal         The patient verbalized understanding of instructions, educational materials, and care plan provided today and declined offer to receive copy of patient instructions, educational materials, and care plan.   Telephone follow up appointment with care management team member scheduled for: 3 months  Henrene Pastor, PharmD Clinical Pharmacist [facility] [phone]

## 2020-12-01 ENCOUNTER — Ambulatory Visit: Payer: HMO

## 2020-12-08 ENCOUNTER — Telehealth: Payer: Self-pay | Admitting: Pharmacist

## 2020-12-08 NOTE — Chronic Care Management (AMB) (Signed)
Chronic Care Management Pharmacy Assistant   Name: Ryan [name]  MRN: [id number] DOB: Dec 03, 1945   Reason for Encounter: Disease State-DM    Recent office visits:  None noted  Recent consult visits:  None noted  Hospital visits:  None in previous 6 months  Medications: Outpatient Encounter Medications as of 12/08/2020  Medication Sig  . apixaban (ELIQUIS) 5 MG TABS tablet TAKE 1 TABLET BY MOUTH TWICE DAILY  . atorvastatin (LIPITOR) 40 MG tablet TAKE 1 TABLET (40 MG TOTAL) BY MOUTH DAILY.  . Calcium Carbonate-Vitamin D 600-400 MG-UNIT tablet Take 1 tablet by mouth 2 (two) times daily. Reported on 01/27/2016  . cholecalciferol (VITAMIN D3) 25 MCG (1000 UNIT) tablet Take 1,000 Units by mouth daily.  . furosemide (LASIX) 20 MG tablet TAKE 1 TABLET (20 MG TOTAL) BY MOUTH DAILY.  Marland Kitchen glucagon (GLUCAGON EMERGENCY) 1 MG injection Inject 1 mg into the muscle once as needed for up to 1 dose.  . insulin aspart (NOVOLOG) 100 UNIT/ML injection INJECT 5-6 UNITS INTO THE SKIN 3 TIMES A DAY WITH MEALS  . insulin detemir (LEVEMIR) 100 UNIT/ML injection INJECT 0.18 ML (18 UNITS) INTO THE SKIN AT BEDTIME  . lisinopril (ZESTRIL) 5 MG tablet TAKE 1 TABLET (5 MG TOTAL) BY MOUTH DAILY.  . metoprolol succinate (TOPROL-XL) 25 MG 24 hr tablet TAKE 1 TABLET (25 MG TOTAL) BY MOUTH DAILY.  Marland Kitchen TRUEPLUS INSULIN SYRINGE 31G X 5/16" 0.5 ML MISC USE TO INJECT INSULIN 4 TIMES A DAY   No facility-administered encounter medications on file as of 12/08/2020.     Recent Relevant Labs: Lab Results  Component Value Date/Time   HGBA1C 6.4 (A) 10/30/2020 02:28 PM   HGBA1C 6.5 (A) 06/26/2020 03:07 PM   HGBA1C 7.2 (H) 10/09/2019 01:15 PM   HGBA1C 7.9 (H) 01/27/2016 03:18 PM   MICROALBUR 0.9 10/09/2019 01:15 PM   MICROALBUR <0.7 01/11/2018 11:03 AM    Kidney Function Lab Results  Component Value Date/Time   CREATININE 0.99 07/25/2020 11:18 AM   CREATININE 1.08 04/25/2020 11:43 AM   CREATININE 1.07 01/01/2020  11:26 AM   CREATININE 0.93 01/15/2014 08:43 AM   GFR 75.20 07/25/2020 11:18 AM   GFRNONAA 68 04/04/2017 02:43 PM   GFRNONAA 85 01/15/2014 08:43 AM   GFRAA 79 04/04/2017 02:43 PM   GFRAA >89 01/15/2014 08:43 AM    . Current antihyperglycemic regimen:  o Novolog 100 unit/ml inject 5-6 units 3 times daily o Levemir 100 units/ml inject 15 units at bedtime  . What recent interventions/DTPs have been made to improve glycemic control:  o None noted  . Have there been any recent hospitalizations or ED visits since last visit with CPP? No   . Patient reports hypoglycemic symptoms, including Sweaty   . Patient reports hyperglycemic symptoms, including fatigue   . How often are you checking your blood sugar? 6-7 times a day  . What are your blood sugars ranging?  o Fasting: 150 o Before meals: 91 o After meals: 89 o Bedtime: 40s   . During the week, how often does your blood glucose drop below 70? 2-3 times a month   . Are you checking your feet daily/regularly?  o Patient states he checks his feet everyday.  Patient states he has had an issue with his blood sugars dropping below 70 in the middle of the night. He recently changed his levemir from 25 units at night to 15 units to see if it will help. He did discuss this  with his provider per patient.  Adherence Review: Is the patient currently on a STATIN medication? Yes Is the patient currently on ACE/ARB medication? Yes Does the patient have >5 day gap between last estimated fill dates? Yes     Star Rating Drugs: Atorvastatin 40 mg last filled 07/31/20 90 DS Lisinopril 5 mg last filled 08/07/20 90 DS    [facility] Clinical Pharmacist Assistant [phone]

## 2020-12-09 ENCOUNTER — Other Ambulatory Visit (HOSPITAL_BASED_OUTPATIENT_CLINIC_OR_DEPARTMENT_OTHER): Payer: Self-pay

## 2020-12-09 ENCOUNTER — Ambulatory Visit (INDEPENDENT_AMBULATORY_CARE_PROVIDER_SITE_OTHER): Payer: HMO | Admitting: Pharmacist

## 2020-12-09 DIAGNOSIS — E7800 Pure hypercholesterolemia, unspecified: Secondary | ICD-10-CM

## 2020-12-09 DIAGNOSIS — I10 Essential (primary) hypertension: Secondary | ICD-10-CM

## 2020-12-09 DIAGNOSIS — E1159 Type 2 diabetes mellitus with other circulatory complications: Secondary | ICD-10-CM

## 2020-12-09 DIAGNOSIS — E1165 Type 2 diabetes mellitus with hyperglycemia: Secondary | ICD-10-CM

## 2020-12-09 DIAGNOSIS — I4892 Unspecified atrial flutter: Secondary | ICD-10-CM

## 2020-12-09 NOTE — Chronic Care Management (AMB) (Signed)
Chronic Care Management Pharmacy Note  12/09/2020 Name:  Ryan [name] MRN:  [id number] DOB:  1945-09-14  Subjective: Ryan [name] is an 75 y.o. year old male who is a primary patient of [name], NP.  The CCM team was consulted for assistance with disease management and care coordination needs.    Engaged with patient by telephone for follow up visit in response to provider referral for pharmacy case management and/or care coordination services.   Consent to Services:  The patient was given information about Chronic Care Management services, agreed to services, and gave verbal consent prior to initiation of services.  Please see initial visit note for detailed documentation.   Patient Care Team: [name], NP as PCP - General (Internal Medicine) Fay Records, MD as PCP - Cardiology (Cardiology) [name], MD as Consulting Physician (Endocrinology) Fay Records, MD as Consulting Physician (Cardiology) Cherre Robins, PharmD (Pharmacist)  Recent office visits: 07/25/2021 - PCP [name]) - f/u chronic conditions; Labs ordered and CT ordered due to abnormal extraocular movements; no med changes noted  Recent consult visits: 10/30/2020 - Endo (Dr Cruzita Lederer) - Reviewed his BG log. BG mostly at goal but they are usually lower after meals compared to before meals.  Decreased NovoLog before each meal to 5 to 6 units.  Also, since he was having low BG during night, advised him to reduce Levemir from 25 to 18 units nightly.  09/08/2020 - Cardio (Dr [name]) - F/U paroxysmal atrial flutter and HTN; no medication changes noted.   Hospital visits: None in previous 6 months  Objective:  Lab Results  Component Value Date   CREATININE 0.99 07/25/2020   CREATININE 1.08 04/25/2020   CREATININE 1.07 01/01/2020    Lab Results  Component Value Date   HGBA1C 6.4 (A) 10/30/2020   Last diabetic Eye exam:  Lab Results  Component Value Date/Time   HMDIABEYEEXA No  Retinopathy 01/24/2020 12:00 AM    Last diabetic Foot exam: No results found for: HMDIABFOOTEX      Component Value Date/Time   CHOL 119 01/01/2020 1126   TRIG 77.0 01/01/2020 1126   HDL 42.40 01/01/2020 1126   CHOLHDL 3 01/01/2020 1126   VLDL 15.4 01/01/2020 1126   LDLCALC 61 01/01/2020 1126    Hepatic Function Latest Ref Rng & Units 04/06/2019 08/10/2017 02/11/2016  Total Protein 6.0 - 8.3 g/dL 7.0 7.8 6.6  Albumin 3.5 - 5.2 g/dL 3.9 4.1 3.5  AST 0 - 37 U/L $Remo'13 16 29  'TyZWp$ ALT 0 - 53 U/L 11 14 33  Alk Phosphatase 39 - 117 U/L 74 69 67  Total Bilirubin 0.2 - 1.2 mg/dL 0.8 1.2 0.9  Bilirubin, Direct 0.0 - 0.3 mg/dL - - -    Lab Results  Component Value Date/Time   TSH 2.335 01/30/2016 04:08 AM   TSH 4.110 01/27/2016 09:05 AM    CBC Latest Ref Rng & Units 09/08/2020 10/06/2017 08/10/2017  WBC 3.4 - 10.8 x10E3/uL 8.9 7.5 9.8  Hemoglobin 13.0 - 17.7 g/dL 14.6 14.0 15.4  Hematocrit 37.5 - 51.0 % 43.4 41.3 46.7  Platelets 150 - 450 x10E3/uL 255 - 227.0    No results found for: VD25OH  Clinical ASCVD: No  The ASCVD Risk score [name], et al., 2013) failed to calculate for the following reasons:   The valid total cholesterol range is 130 to 320 mg/dL    Other: CHADS2VASc = 3  Social History   Tobacco Use  Smoking Status  Former Smoker  . Types: Cigarettes  . Quit date: 09/01/1965  . Years since quitting: 55.3  Smokeless Tobacco Never Used   BP Readings from Last 3 Encounters:  10/30/20 120/82  09/08/20 140/82  07/25/20 140/85   Pulse Readings from Last 3 Encounters:  10/30/20 70  09/08/20 64  07/25/20 (!) 59   Wt Readings from Last 3 Encounters:  10/30/20 171 lb 6.4 oz (77.7 kg)  09/08/20 174 lb (78.9 kg)  07/25/20 170 lb 6.4 oz (77.3 kg)    Assessment: Review of patient past medical history, allergies, medications, health status, including review of consultants reports, laboratory and other test data, was performed as part of comprehensive evaluation and provision  of chronic care management services.   SDOH:  (Social Determinants of Health) assessments and interventions performed:  SDOH Interventions   Flowsheet Row Most Recent Value  SDOH Interventions   Financial Strain Interventions Other (Comment)  [patient has received assistance with Levemir and novolog in past from [employer]. Assisting with reapplication process.]  Transportation Interventions Intervention Not Indicated      CCM Care Plan  Allergies  Allergen Reactions  . Other Other (See Comments)    Client states he became very sick and had significant skin inflammation and irritation with the Freestyle Libre sensor    Medications Reviewed Today    Reviewed by Cherre Robins, PharmD (Pharmacist) on 12/09/20 at 1034  Med List Status: <None>  Medication Order Taking? Sig Documenting Provider Last Dose Status Informant  apixaban (ELIQUIS) 5 MG TABS tablet [id number] Yes TAKE 1 TABLET BY MOUTH TWICE DAILY Fay Records, MD Taking Active   atorvastatin (LIPITOR) 40 MG tablet [id number] Yes TAKE 1 TABLET (40 MG TOTAL) BY MOUTH DAILY. [name], NP Taking Active   Calcium Carbonate-Vitamin D 600-400 MG-UNIT tablet [id number] Yes Take 1 tablet by mouth 2 (two) times daily. Reported on 01/27/2016 [provider] Taking Active Self  cholecalciferol (VITAMIN D3) 25 MCG (1000 UNIT) tablet [id number] Yes Take 1,000 Units by mouth daily. [provider] Taking Active Self  furosemide (LASIX) 20 MG tablet [id number] Yes TAKE 1 TABLET (20 MG TOTAL) BY MOUTH DAILY. [name], NP Taking Active   glucagon [facility]) 1 MG injection [id number] Yes Inject 1 mg into the muscle once as needed for up to 1 dose. [name], MD Taking Active   insulin aspart (NOVOLOG) 100 UNIT/ML injection [id number] Yes INJECT 5-6 UNITS INTO THE SKIN 3 TIMES A DAY WITH MEALS [name], MD Taking Active   insulin detemir (LEVEMIR) 100 UNIT/ML injection [id number] Yes INJECT 0.18  ML (18 UNITS) INTO THE SKIN AT BEDTIME [name], MD Taking Active   lisinopril (ZESTRIL) 5 MG tablet [id number]  TAKE 1 TABLET (5 MG TOTAL) BY MOUTH DAILY. [name], NP  Active   metoprolol succinate (TOPROL-XL) 25 MG 24 hr tablet [id number]  TAKE 1 TABLET (25 MG TOTAL) BY MOUTH DAILY. [name], NP  Active   TRUEPLUS INSULIN SYRINGE 31G X 5/16" 0.5 ML MISC [id number]  USE TO INJECT INSULIN 4 TIMES A [name], MD  Active           Patient Active Problem List   Diagnosis Date Noted  . Chronic diastolic CHF (congestive heart failure) ([place]) 07/27/2020  . Moderate mitral regurgitation 07/27/2020  . Left foot pain 04/12/2019  . Overweight (BMI 25.0-29.9) 01/12/2019  . Atrial flutter ([place]) 01/27/2016  . Elevated troponin 01/27/2016  . Poorly controlled type 2 diabetes mellitus with circulatory disorder (  [place]) 09/08/2015  . Preventative health care 02/05/2015  . Benign paroxysmal positional vertigo 01/15/2014  . Skin lesion of left arm 05/11/2013  . Osteopenia 06/09/2011  . Hyperbilirubinemia 05/26/2011  . HTN (hypertension) 02/22/2011  . Hyperlipidemia 02/22/2011  . GERD (gastroesophageal reflux disease) 02/22/2011  . Allergic rhinitis 02/22/2011    Immunization History  Administered Date(s) Administered  . Fluad Quad(high Dose 65+) 05/15/2019, 04/25/2020  . H1N1 11/19/2008  . Influenza Split 05/25/2011, 04/20/2012  . Influenza Whole 06/06/2009  . Influenza, High Dose Seasonal PF 04/20/2016, 05/12/2017, 05/26/2018  . Influenza,inj,Quad PF,6+ Mos 05/07/2013, 05/27/2014, 05/13/2015  . PFIZER(Purple Top)SARS-COV-2 Vaccination 09/30/2019, 10/24/2019, 07/22/2020  . Pneumococcal Conjugate-13 10/19/2004, 05/11/2013  . Pneumococcal Polysaccharide-23 05/25/2011, 09/08/2018  . Td 08/09/2001, 08/25/2011  . Zoster 08/25/2011    Conditions to be addressed/monitored: HTN, DMII and HDL and atrial flutter  Care Plan : General Pharmacy (Adult)  Updates  made by Cherre Robins, PHARMD since 12/09/2020 12:00 AM    Problem: HTN, HLD, Afib, DM   Priority: High  Onset Date: 09/25/2020    Long-Range Goal: Provide medication management and chronic conditions management for: DM, HTN, atrial fibrillation and other chronic conditions   Start Date: 09/25/2020  Expected End Date: 03/25/2021  Recent Progress: On track  Priority: High  Note:   Current Barriers:  . Unable to independently afford treatment regimen - assistance in progress for Eliquis and Levemir . Provide medication and disease state management for diabetes; hypertension, hyperlipidemia and atrial fibrillation  Pharmacist Clinical Goal(s):  Marland Kitchen Over the next 180 days, patient will verbalize ability to afford treatment regimen . maintain control of blood sugar and blood pressure as evidenced by home monitoring  . contact provider office for questions/concerns as evidenced notation of same in electronic health record through collaboration with PharmD and provider.    Interventions: . 1:1 collaboration with [name], NP regarding development and update of comprehensive plan of care as evidenced by provider attestation and co-signature . Inter-disciplinary care team collaboration (see longitudinal plan of care) . Comprehensive medication review performed; medication list updated in electronic medical record  Hypertension (BP goal <130/80)  Controlled  Current treatment:  Lisinopril 5mg  daily  Metoprolol succinate 25mg  daily  Medications previously tried: amlodipine (unsure why d/c)   Current home readings per patient 125 to 135 / 70 to 80  Denies hypotensive/hypertensive symptoms . Interventions;  o Educated on BP goals and benefits of medications for prevention of heart attack, stroke and kidney damage; o Exercise goal of 150 minutes per week; o Counseled to monitor BP at home periodically, document, and provide log at future appointments o Recommended to continue  current medication  Hyperlipidemia: (LDL goal < 70) . Controlled . Current treatment: o Atorvastatin 40mg  daily . Medications previously tried: pravastatin . Current exercise habits: walking 2-3 miles every other day . Interventions:  o Educated on Cholesterol goals;  o Discussed benefits of statin for ASCVD risk reduction; o Recommended to continue current medication  Diabetes (A1c goal <7%) . Controlled but mentioned to pharmacy assistant yesterday that he had again lowered Levemir dose form 18units to 15 units at night due to having hypoglycemia in 40's about 2 to 3 times per month . Reports he usually uses juice to treat hypoglycemia episodes . Current medications: o Novolog 5-8 units TID w/ meals o Levemir 15 units at bedtime (has only been at this dose for a few days) . Medications previously tried: metformin (GI) . Interventions:  o Reviewed recognition and treatment of  hypoglycemia.  o Reviewed home blood glucose readings and reviewed goals  - Fasting blood glucose goal (before meals) = 80 to 130 - Blood glucose goal after a meal = less than 180  o Reminded to check feet daily and get yearly eye exams o Recommended to continue current medication and follow up with Dr Cruzita Lederer o Called [employer] to check on application status. Still needs patient income information. Patient is aware. He said he mailed to our office but I have not seen yet. He will come by office to provide information and to sign application again as I am not able to find patient portion of application from Feb 6010.   Atrial Flutter (Goal: prevent stroke and major bleeding) . Controlled  . CHADSVASC: 3 . Current treatment: o Rate control: Metoprolol succinate 25mg  daily o Anticoagulation: Eliquis 5mg  twice daily . Medications previously tried: none noted . Interventions:  o Counseled on bleeding risk associated with Eliquis and importance of self-monitoring for signs/symptoms of bleeding; o Assessed  patient finances. Discussed availability of PAP program for Eliquis; plan to reassess eligibility in June or July since patient has to spend 3% OOP to qualify for Eliquis PAP o Recommended continue current medications  Medication Management:  . Pharmacy assistance mentioned that patient has not filled atorvastatin or lisinopril in last 90 days.  . Reviewed fill history in Epic and looks like last refill for these meds was in December 2021 but when I spoke to his pharmacy, they verified that patient has filled atorvastatin and lisinopril for 90 day supply on 11/06/2020. . Will continue to review adherence   Patient Goals/Self-Care Activities . Over the next 180 days, patient will:  take medications as prescribed check glucose daily, document, and provide at future appointments collaborate with provider on medication access solutions  Follow Up Plan: The care management team will reach out to the patient again over the next 180 days.      Medication Assistance: Application for Levemir and Novolog  medication assistance program. in process.  Anticipated assistance start date 01/07/221.  See plan of care for additional detail.  Plan to follow up in June or July to see if patient has spent 3% of income OOP on meds which is needed to qualify for Eliquis assistance program.  Patient to bring in needed financial documents  Patient's preferred pharmacy is:  [facility] [address], [place] [address] Phone: [phone] Fax: [place] [address] Phone: [phone] Fax: [phone]  [facility] (New Address) - [place], [place] - [place] AT Previously: [address] [place] [address] Phone: [phone] Fax: [phone]   Follow Up:  Patient agrees to Care Plan  and Follow-up.  Plan: Telephone follow up appointment with care management team member scheduled for:  3 months  Cherre Robins, PharmD Clinical Pharmacist [place] [facility]

## 2020-12-09 NOTE — Patient Instructions (Signed)
Visit Information  PATIENT GOALS: Goals Addressed            This Visit's Progress   . Chronic Care Management Pharmacy Care Plan       CARE PLAN ENTRY (see longitudinal plan of care for additional care plan information)  Current Barriers:  . Chronic Disease Management support, education, and care coordination needs related to Afib/flutter, diabetes, HTN - high blood pressure, hyperlipidemia - high cholesterol, GERD - acid reflux / heartburn, Osteopenia, Gout   Hypertension BP Readings from Last 3 Encounters:  10/30/20 120/82  09/08/20 140/82  07/25/20 140/85   . Pharmacist Clinical Goal(s): o Over the next 90 days, patient will work with PharmD and providers to maintain BP goal <140/90 . Current regimen:   Lisinopril 5mg  daily  Metoprolol succinate 25mg  daily . Patient self care activities - Over the next 90 days, patient will: o Check blood pressure daily, document, and provide at future appointments o Ensure daily salt intake < 2300 mg/day  Hyperlipidemia Lab Results  Component Value Date/Time   LDLCALC 61 01/01/2020 11:26 AM   . Pharmacist Clinical Goal(s): o Over the next 90 days, patient will work with PharmD and providers to maintain LDL goal < 70 . Current regimen:  o Atorvastatin 40mg  daily . Patient self care activities - Over the next 90 days, patient will: o Maintain cholesterol medication regimen.   Diabetes Lab Results  Component Value Date/Time   HGBA1C 6.4 (A) 10/30/2020 02:28 PM   HGBA1C 6.5 (A) 06/26/2020 03:07 PM   HGBA1C 7.2 (H) 10/09/2019 01:15 PM   HGBA1C 7.9 (H) 01/27/2016 03:18 PM   . Pharmacist Clinical Goal(s): o Over the next 90 days, patient will work with PharmD and providers to maintain A1c goal <7% . Current regimen:   Novolog 5 to 8 units 3 times a day w/ meals  Levemir 15 units at bedtime . Interventions: o Coordinated completion of patient assistance for 06/28/2020 - Novolog and Levemir; Contacted PAP to see what  information is missing - Needs patient income information; Verified dose of Novolog per above.  . Patient self care activities - Over the next 90 days, patient will: o Check blood sugar 3-4 times daily, document, and provide at future appointments o Contact provider with any episodes of hypoglycemia o Bring in income documentation to office for patient assistance program  Atrial Flutter:  . Pharmacist Clinical Goal(s) o Over the next 90 days, patient will work with PharmD and providers to prevent stroke and control heart rate . Current treatment:  o Eliquis 5mg  twice a day o Metoprolol succinate 25mg  daily . Interventions: o Patient is not eligible for patient assistance program at this time - has to spend 3% of income in 2022. Will plan to follow up with patient in June or July to assess eligibility again . Patient self care activities - Over the next 90 days, patient will: o Continue current therapy for atrial fibrillation   Health Maintenance  . Pharmacist Clinical Goal(s) o Over the next 90 days, patient will work with PharmD and providers to complete health maintenance screenings/vaccinations . Interventions: o Discussed completing Shingrix vaccine series . Patient self care activities - Over the next 90 days, patient will: o Complete Shingrix vaccine series    Medication management . Pharmacist Clinical Goal(s): o Over the next 90 days, patient will work with PharmD and providers to maintain optimal medication adherence . Current pharmacy[facility] . Interventions o Comprehensive medication review performed.  o Continue current medication management strategy . Patient self care activities - Over the next 90 days, patient will: o Focus on medication adherence by filling and taking medications appropriately  o Take medications as prescribed o Report any questions or concerns to PharmD and/or provider(s)  Please see past updates related to this goal by  clicking on the "Past Updates" button in the selected goal         The patient verbalized understanding of instructions, educational materials, and care plan provided today and declined offer to receive copy of patient instructions, educational materials, and care plan.   Telephone follow up appointment with care management team member scheduled for: 3 months  Henrene Pastor, PharmD Clinical Pharmacist [facility]

## 2020-12-11 ENCOUNTER — Other Ambulatory Visit (HOSPITAL_BASED_OUTPATIENT_CLINIC_OR_DEPARTMENT_OTHER): Payer: Self-pay

## 2020-12-11 MED FILL — Apixaban Tab 5 MG: ORAL | 30 days supply | Qty: 60 | Fill #1 | Status: AC

## 2020-12-18 ENCOUNTER — Ambulatory Visit: Payer: HMO | Admitting: Pharmacist

## 2020-12-18 ENCOUNTER — Other Ambulatory Visit (HOSPITAL_BASED_OUTPATIENT_CLINIC_OR_DEPARTMENT_OTHER): Payer: Self-pay

## 2020-12-18 DIAGNOSIS — E7800 Pure hypercholesterolemia, unspecified: Secondary | ICD-10-CM

## 2020-12-18 DIAGNOSIS — I4892 Unspecified atrial flutter: Secondary | ICD-10-CM

## 2020-12-18 DIAGNOSIS — I10 Essential (primary) hypertension: Secondary | ICD-10-CM

## 2020-12-18 DIAGNOSIS — E1159 Type 2 diabetes mellitus with other circulatory complications: Secondary | ICD-10-CM

## 2020-12-18 DIAGNOSIS — E1165 Type 2 diabetes mellitus with hyperglycemia: Secondary | ICD-10-CM

## 2020-12-18 NOTE — Chronic Care Management (AMB) (Signed)
Chronic Care Management Pharmacy Note  12/18/2020 Name:  Ryan [name] MRN:  [id number] DOB:  [birth year]  Subjective: Ryan [name] is an 75 y.o. year old male who is a primary patient of Ryan Alar, NP.  The CCM team was consulted for assistance with disease management and care coordination needs.    Collaboration with patient, pharmacy and patient assistance programs for follow up on patient assistance application / medication access in response to provider referral for pharmacy case management and/or care coordination services.   Consent to Services:  The patient was given information about Chronic Care Management services, agreed to services, and gave verbal consent prior to initiation of services.  Please see initial visit note for detailed documentation.   Patient Care Team: Ryan Alar, NP as PCP - General (Internal Medicine) Ryan Records, MD as PCP - Cardiology (Cardiology) Ryan Kingdom, MD as Consulting Physician (Endocrinology) Ryan Records, MD as Consulting Physician (Cardiology) Ryan [name], PharmD (Pharmacist)  Recent office visits: 07/25/2021 - PCP Ryan [name]) - f/u chronic conditions; Labs ordered and CT ordered due to abnormal extraocular movements; no med changes noted  Recent consult visits: 10/30/2020 - Endo (Ryan [name]) - Reviewed his BG log. BG mostly at goal but they are usually lower after meals compared to before meals. Decreased NovoLog before each meal to 5 to 6 units. Also, since he was having low BG during night, advised him to reduce Levemir from 25 to 18 units nightly.  09/08/2020 - Cardio (Ryan [name]) - F/U paroxysmalatrial flutter and HTN; no medication changes noted.  Hospital visits: None in previous 6 months  Objective:  Lab Results  Component Value Date   CREATININE 0.99 07/25/2020   CREATININE 1.08 04/25/2020   CREATININE 1.07 01/01/2020    Lab Results  Component Value Date   HGBA1C 6.4 (A) 10/30/2020    Last diabetic Eye exam:  Lab Results  Component Value Date/Time   HMDIABEYEEXA No Retinopathy 01/24/2020 12:00 AM    Last diabetic Foot exam: No results found for: HMDIABFOOTEX      Component Value Date/Time   CHOL 119 01/01/2020 1126   TRIG 77.0 01/01/2020 1126   HDL 42.40 01/01/2020 1126   CHOLHDL 3 01/01/2020 1126   VLDL 15.4 01/01/2020 1126   LDLCALC 61 01/01/2020 1126    Hepatic Function Latest Ref Rng & Units 04/06/2019 08/10/2017 02/11/2016  Total Protein 6.0 - 8.3 g/dL 7.0 7.8 6.6  Albumin 3.5 - 5.2 g/dL 3.9 4.1 3.5  AST 0 - 37 U/L _0 ALT 0 - 53 U/L 11 14 33  Alk Phosphatase 39 - 117 U/L 74 69 67  Total Bilirubin 0.2 - 1.2 mg/dL 0.8 1.2 0.9  Bilirubin, Direct 0.0 - 0.3 mg/dL - - -    Lab Results  Component Value Date/Time   TSH 2.335 01/30/2016 04:08 AM   TSH 4.110 01/27/2016 09:05 AM    CBC Latest Ref Rng & Units 09/08/2020 10/06/2017 08/10/2017  WBC 3.4 - 10.8 x10E3/uL 8.9 7.5 9.8  Hemoglobin 13.0 - 17.7 g/dL 14.6 14.0 15.4  Hematocrit 37.5 - 51.0 % 43.4 41.3 46.7  Platelets 150 - 450 x10E3/uL 255 - 227.0    No results found for: VD25OH  Clinical ASCVD: No  The ASCVD Risk score Ryan [name]., et al., 2013) failed to calculate for the following reasons:   The valid total cholesterol range is 130 to 320 mg/dL    Other: CHADS2VASc = 3  Social History  Tobacco Use  Smoking Status Former Smoker  . Types: Cigarettes  . Quit date: 09/01/1965  . Years since quitting: 55.3  Smokeless Tobacco Never Used   BP Readings from Last 3 Encounters:  10/30/20 120/82  09/08/20 140/82  07/25/20 140/85   Pulse Readings from Last 3 Encounters:  10/30/20 70  09/08/20 64  07/25/20 (!) 59   Wt Readings from Last 3 Encounters:  10/30/20 171 lb 6.4 oz (77.7 kg)  09/08/20 174 lb (78.9 kg)  07/25/20 170 lb 6.4 oz (77.3 kg)    Assessment: Review of patient past medical history, allergies, medications, health status, including review of consultants reports,  laboratory and other test data, was performed as part of comprehensive evaluation and provision of chronic care management services.   SDOH:  (Social Determinants of Health) assessments and interventions performed:  SDOH Interventions   Flowsheet Row Most Recent Value  SDOH Interventions   Financial Strain Interventions Other (Comment)  [Assisting patient with applying for manufacturer assistance programs.]      CCM Care Plan  Allergies  Allergen Reactions  . Other Other (See Comments)    Client states he became very sick and had significant skin inflammation and irritation with the Freestyle Libre sensor    Medications Reviewed Today    Reviewed by Ryan [name], PharmD (Pharmacist) on 12/18/20 at 1148  Med List Status: <None>  Medication Order Taking? Sig Documenting Provider Last Dose Status Informant  apixaban (ELIQUIS) 5 MG TABS tablet [id number] Yes TAKE 1 TABLET BY MOUTH TWICE DAILY [name], [name], MD Taking Active   atorvastatin (LIPITOR) 40 MG tablet [id number] Yes TAKE 1 TABLET (40 MG TOTAL) BY MOUTH DAILY. [name], [name], NP Taking Active   Calcium Carbonate-Vitamin D 600-400 MG-UNIT tablet [id number] Yes Take 1 tablet by mouth 2 (two) times daily. Reported on 01/27/2016 [provider] Taking Active Self  cholecalciferol (VITAMIN D3) 25 MCG (1000 UNIT) tablet [id number] Yes Take 1,000 Units by mouth daily. [provider] Taking Active Self  furosemide (LASIX) 20 MG tablet [id number] Yes TAKE 1 TABLET (20 MG TOTAL) BY MOUTH DAILY. [name], [name], NP Taking Active   glucagon (GLUCAGON EMERGENCY) 1 MG injection [id number] Yes Inject 1 mg into the muscle once as needed for up to 1 dose. [name], [name], MD Taking Active   insulin aspart (NOVOLOG) 100 UNIT/ML injection [id number] Yes INJECT 5-6 UNITS INTO THE SKIN 3 TIMES A DAY WITH MEALS [name], [name], MD Taking Active   insulin detemir (LEVEMIR) 100 UNIT/ML injection [id number] Yes INJECT 0.18 ML (18  UNITS) INTO THE SKIN AT BEDTIME [name], [name], MD Taking Active   lisinopril (ZESTRIL) 5 MG tablet [id number] Yes TAKE 1 TABLET (5 MG TOTAL) BY MOUTH DAILY. [name], [name], NP Taking Active   metoprolol succinate (TOPROL-XL) 25 MG 24 hr tablet [id number] Yes TAKE 1 TABLET (25 MG TOTAL) BY MOUTH DAILY. [name], [name], NP Taking Active   TRUEPLUS INSULIN SYRINGE 31G X 5/16" 0.5 ML MISC [id number] Yes USE TO INJECT INSULIN 4 TIMES A DAY [name], [name], MD Taking Active           Patient Active Problem List   Diagnosis Date Noted  . Chronic diastolic CHF (congestive heart failure) (HCC) 07/27/2020  . Moderate mitral regurgitation 07/27/2020  . Left foot pain 04/12/2019  . Overweight (BMI 25.0-29.9) 01/12/2019  . Atrial flutter (HCC) 01/27/2016  . Elevated troponin 01/27/2016  . Poorly controlled type 2 diabetes mellitus with circulatory disorder (HCC) 09/08/2015  . Preventative health care 02/05/2015  .   Benign paroxysmal positional vertigo 01/15/2014  . Skin lesion of left arm 05/11/2013  . Osteopenia 06/09/2011  . Hyperbilirubinemia 05/26/2011  . HTN (hypertension) 02/22/2011  . Hyperlipidemia 02/22/2011  . GERD (gastroesophageal reflux disease) 02/22/2011  . Allergic rhinitis 02/22/2011    Immunization History  Administered Date(s) Administered  . Fluad Quad(high Dose 65+) 05/15/2019, 04/25/2020  . H1N1 11/19/2008  . Influenza Split 05/25/2011, 04/20/2012  . Influenza Whole 06/06/2009  . Influenza, High Dose Seasonal PF 04/20/2016, 05/12/2017, 05/26/2018  . Influenza,inj,Quad PF,6+ Mos 05/07/2013, 05/27/2014, 05/13/2015  . PFIZER(Purple Top)SARS-COV-2 Vaccination 09/30/2019, 10/24/2019, 07/22/2020  . Pneumococcal Conjugate-13 10/19/2004, 05/11/2013  . Pneumococcal Polysaccharide-23 05/25/2011, 09/08/2018  . Td 08/09/2001, 08/25/2011  . Zoster 08/25/2011    Conditions to be addressed/monitored: HTN, HLD, DMII and atrial flutter  There are no care plans that  you recently modified to display for this patient.   Medication Assistance: Application for Eliqus, Novolog and Levemir  medication assistance program. in process.  Anticipated assistance start date 01/07/2021 for Levemir and Novolog; .  See plan of care for additional detail.  Unsure of when can send in application for Eliquis as patient will need to meet 3% OOP spend before can apply.  Patient's preferred pharmacy is:  [facility] [address], [address] [place] [address] Phone: [phone] Fax: [phone]  [facility] [address] [place]  [address] Phone: [phone] Fax: [phone]  [facility] (New Address) - [place], [place] - [address] AT Previously: [address], [place] [address] [place] [address] Phone: [phone] Fax: [phone]   Follow Up:  Patient agrees to Care Plan and Follow-up.  Plan: The care management team will reach out to the patient again over the next 45 days.  [name] [name], PharmD Clinical Pharmacist [facility]     

## 2020-12-18 NOTE — Patient Instructions (Signed)
Visit Information  PATIENT GOALS: Goals Addressed            This Visit's Progress   . Chronic Care Management Pharmacy Care Plan   On track    CARE PLAN ENTRY (see longitudinal plan of care for additional care plan information)  Current Barriers:  . Chronic Disease Management support, education, and care coordination needs related to Afib/flutter, diabetes, HTN - high blood pressure, hyperlipidemia - high cholesterol, GERD - acid reflux / heartburn, Osteopenia, Gout   Hypertension BP Readings from Last 3 Encounters:  10/30/20 120/82  09/08/20 140/82  07/25/20 140/85   . Pharmacist Clinical Goal(s): o Over the next 90 days, patient will work with PharmD and providers to maintain BP goal <140/90 . Current regimen:   Lisinopril 5mg  daily  Metoprolol succinate 25mg  daily . Patient self care activities - Over the next 90 days, patient will: o Check blood pressure daily, document, and provide at future appointments o Ensure daily salt intake < 2300 mg/day  Hyperlipidemia Lab Results  Component Value Date/Time   LDLCALC 61 01/01/2020 11:26 AM   . Pharmacist Clinical Goal(s): o Over the next 90 days, patient will work with PharmD and providers to maintain LDL goal < 70 . Current regimen:  o Atorvastatin 40mg  daily . Patient self care activities - Over the next 90 days, patient will: o Maintain cholesterol medication regimen.   Diabetes Lab Results  Component Value Date/Time   HGBA1C 6.4 (A) 10/30/2020 02:28 PM   HGBA1C 6.5 (A) 06/26/2020 03:07 PM   HGBA1C 7.2 (H) 10/09/2019 01:15 PM   HGBA1C 7.9 (H) 01/27/2016 03:18 PM   . Pharmacist Clinical Goal(s): o Over the next 90 days, patient will work with PharmD and providers to maintain A1c goal <7% . Current regimen:   Novolog 5 to 8 units 3 times a day w/ meals  Levemir 15 units at bedtime . Interventions: o Coordinated completion of patient assistance for 06/28/2020 - Novolog and Levemir; Patient has supplied  needed financial information; information faced to 12/09/2019 today.  . Patient self care activities - Over the next 90 days, patient will: o Check blood sugar 3-4 times daily, document, and provide at future appointments o Contact provider with any episodes of hypoglycemia  Atrial Flutter:  . Pharmacist Clinical Goal(s) o Over the next 90 days, patient will work with PharmD and providers to prevent stroke and control heart rate . Current treatment:  o Eliquis 5mg  twice a day o Metoprolol succinate 25mg  daily . Interventions: o Coordinated with pharmacy to get current out of pocket spend for 2022. Not at required OOP spend for Eliquis patient assistance application. Has to spend 3% of income in 2022. Will plan to follow up with patient in July to assess eligibility again . Patient self care activities - Over the next 90 days, patient will: o Continue current therapy for atrial fibrillation   Health Maintenance  . Pharmacist Clinical Goal(s) o Over the next 90 days, patient will work with PharmD and providers to complete health maintenance screenings/vaccinations . Interventions: o Discussed completing Shingrix vaccine series . Patient self care activities - Over the next 90 days, patient will: o Complete Shingrix vaccine series    Medication management . Pharmacist Clinical Goal(s): o Over the next 90 days, patient will work with PharmD and providers to maintain optimal medication adherence . Current pharmacy: Pharmacy . Interventions o Comprehensive medication review performed. o Continue current medication management strategy . Patient  self care activities - Over the next 90 days, patient will: o Focus on medication adherence by filling and taking medications appropriately  o Take medications as prescribed o Report any questions or concerns to PharmD and/or provider(s)  Please see past updates related to this goal by clicking on the "Past Updates"  button in the selected goal         The patient verbalized understanding of instructions, educational materials, and care plan provided today and declined offer to receive copy of patient instructions, educational materials, and care plan.   Telephone follow up appointment with care management team member scheduled for:  Henrene Pastor, PharmD Clinical Pharmacist [facility]

## 2021-01-07 ENCOUNTER — Telehealth: Payer: Self-pay | Admitting: Pharmacist

## 2021-01-07 NOTE — Chronic Care Management (AMB) (Signed)
    Chronic Care Management Pharmacy Assistant   Name: Ryan [name]  MRN: [id number] DOB: 12/30/1945   Reason for Encounter: Patient assistance application     Medications: Outpatient Encounter Medications as of 01/07/2021  Medication Sig  . apixaban (ELIQUIS) 5 MG TABS tablet TAKE 1 TABLET BY MOUTH TWICE DAILY  . atorvastatin (LIPITOR) 40 MG tablet TAKE 1 TABLET (40 MG TOTAL) BY MOUTH DAILY.  . Calcium Carbonate-Vitamin D 600-400 MG-UNIT tablet Take 1 tablet by mouth 2 (two) times daily. Reported on 01/27/2016  . cholecalciferol (VITAMIN D3) 25 MCG (1000 UNIT) tablet Take 1,000 Units by mouth daily.  . furosemide (LASIX) 20 MG tablet TAKE 1 TABLET (20 MG TOTAL) BY MOUTH DAILY.  Marland Kitchen glucagon (GLUCAGON EMERGENCY) 1 MG injection Inject 1 mg into the muscle once as needed for up to 1 dose.  . insulin aspart (NOVOLOG) 100 UNIT/ML injection INJECT 5-6 UNITS INTO THE SKIN 3 TIMES A DAY WITH MEALS  . insulin detemir (LEVEMIR) 100 UNIT/ML injection INJECT 0.18 ML (18 UNITS) INTO THE SKIN AT BEDTIME  . lisinopril (ZESTRIL) 5 MG tablet TAKE 1 TABLET (5 MG TOTAL) BY MOUTH DAILY.  . metoprolol succinate (TOPROL-XL) 25 MG 24 hr tablet TAKE 1 TABLET (25 MG TOTAL) BY MOUTH DAILY.  Marland Kitchen TRUEPLUS INSULIN SYRINGE 31G X 5/16" 0.5 ML MISC USE TO INJECT INSULIN 4 TIMES A DAY   No facility-administered encounter medications on file as of 01/07/2021.   [place] regarding patients Novolog and Levemir application. Patient was approved. End date for assistance is 08/08/21. Patients order was processed on 12/22/20 and the expected arrival date is 01/09/21.   [name] Clinical Pharmacist Assistant [phone]

## 2021-01-07 NOTE — Telephone Encounter (Signed)
-----   Message from Cherre Robins, PharmD sent at 01/07/2021  2:04 PM EDT ----- Regarding: patient assistance Please check to see if patient has been approved for [employer] Patient assistance for Levemir and Novolog.   Thanks! [name]

## 2021-01-12 ENCOUNTER — Other Ambulatory Visit (HOSPITAL_BASED_OUTPATIENT_CLINIC_OR_DEPARTMENT_OTHER): Payer: Self-pay

## 2021-01-12 MED FILL — Apixaban Tab 5 MG: ORAL | 30 days supply | Qty: 60 | Fill #2 | Status: AC

## 2021-01-20 ENCOUNTER — Telehealth: Payer: Self-pay | Admitting: Internal Medicine

## 2021-01-20 NOTE — Telephone Encounter (Signed)
Notified pt that NovoNordisk patient assistance is here and ready to pick up. Pt states he will try to be by today to pick it up.   5 boxes of NovoFine 32G tip 2 boxes Novolog Flexpen 2 boxes Levemir FlexTouch

## 2021-01-23 ENCOUNTER — Other Ambulatory Visit (HOSPITAL_BASED_OUTPATIENT_CLINIC_OR_DEPARTMENT_OTHER): Payer: Self-pay

## 2021-01-23 ENCOUNTER — Other Ambulatory Visit: Payer: Self-pay

## 2021-01-23 ENCOUNTER — Ambulatory Visit (INDEPENDENT_AMBULATORY_CARE_PROVIDER_SITE_OTHER): Payer: HMO | Admitting: Family

## 2021-01-23 VITALS — BP 138/81 | HR 98 | Temp 98.4°F | Resp 16 | Wt 174.0 lb

## 2021-01-23 DIAGNOSIS — I10 Essential (primary) hypertension: Secondary | ICD-10-CM

## 2021-01-23 DIAGNOSIS — I5032 Chronic diastolic (congestive) heart failure: Secondary | ICD-10-CM | POA: Diagnosis not present

## 2021-01-23 DIAGNOSIS — E7800 Pure hypercholesterolemia, unspecified: Secondary | ICD-10-CM | POA: Diagnosis not present

## 2021-01-23 DIAGNOSIS — M25551 Pain in right hip: Secondary | ICD-10-CM | POA: Diagnosis not present

## 2021-01-23 LAB — LIPID PANEL
Cholesterol: 140 mg/dL (ref 0–200)
HDL: 46.7 mg/dL (ref >39.00)
LDL Cholesterol: 73 mg/dL (ref 0–99)
NonHDL: 93.05
Total CHOL/HDL Ratio: 3
Triglycerides: 102 mg/dL (ref 0.0–149.0)
VLDL: 20.4 mg/dL (ref 0.0–40.0)

## 2021-01-23 LAB — COMPREHENSIVE METABOLIC PANEL
ALT: 16 U/L (ref 0–53)
AST: 17 U/L (ref 0–37)
Albumin: 4.4 g/dL (ref 3.5–5.2)
Alkaline Phosphatase: 74 U/L (ref 39–117)
BUN: 11 mg/dL (ref 6–23)
CO2: 29 mEq/L (ref 19–32)
Calcium: 9.9 mg/dL (ref 8.4–10.5)
Chloride: 102 mEq/L (ref 96–112)
Creatinine, Ser: 1.14 mg/dL (ref 0.40–1.50)
GFR: 63.27 mL/min (ref >60.00)
Glucose, Bld: 77 mg/dL (ref 70–99)
Potassium: 3.8 mEq/L (ref 3.5–5.1)
Sodium: 140 mEq/L (ref 135–145)
Total Bilirubin: 1.5 mg/dL — ABNORMAL HIGH (ref 0.2–1.2)
Total Protein: 7.3 g/dL (ref 6.0–8.3)

## 2021-01-23 MED ORDER — SHINGRIX 50 MCG/0.5ML IM SUSR
0.5000 mL | Freq: Once | INTRAMUSCULAR | 1 refills | Status: AC
  Filled 2021-01-23: qty 0.5, 1d supply, fill #0
  Filled 2021-05-20: qty 0.5, 1d supply, fill #1

## 2021-01-23 NOTE — Assessment & Plan Note (Signed)
Wt Readings from Last 3 Encounters:  01/23/21 174 lb (78.9 kg)  10/30/20 171 lb 6.4 oz (77.7 kg)  09/08/20 174 lb (78.9 kg)   Weight is stable. Continue lasix 40mg  once daily.

## 2021-01-23 NOTE — Progress Notes (Signed)
Subjective:   By signing my name below, I, Ryan [name], attest that this documentation has been prepared under the direction and in the presence of Sandford Craze NP. 01/23/2021     Patient ID: Ryan [name], male    DOB: Aug 16, 1945, 75 y.o.   MRN: [id number]  Chief Complaint  Patient presents with   Hypertension    Here for follow up   Diabetes    Here for follow up    HPI Patient is in today for a office visit. He complains of right hip pain. He cannot exercise as much has he would like. He does not want to see a specialist to further evaluate his hip pain.   Immunizations-- He has 3 Covid-19 vaccines at this time.  Diabetes- His last a1c lab report showed improvement. His blood sugar measurements at home have occasionally dropped around 3 AM so he has been setting his alarm and checking his sugar at that time. He continues seeing Dr. Elvera Lennox to manage his diabetes and has an upcoming appointment.  Lab Results  Component Value Date   HGBA1C 6.4 (A) 10/30/2020    Hypertension- He brought his blood pressure logs for the week. His blood pressure is doing well during this visit. He continues taking 5 mg lisinopril daily PO, 25 mg metoprolol succinate daily PO, and 20 mg lasix daily PO to manage his blood pressure. BP Readings from Last 3 Encounters:  01/23/21 138/81  10/30/20 120/82  09/08/20 140/82    Health Maintenance Due  Topic Date Due   Zoster Vaccines- Shingrix (1 of 2) Never done   COVID-19 Vaccine (4 - Booster for [employer] series) 11/20/2020   OPHTHALMOLOGY EXAM  01/23/2021    Past Medical History:  Diagnosis Date   Allergy    Diabetes mellitus    GERD (gastroesophageal reflux disease)    History of chicken pox    Hyperlipidemia    Hypertension    Myocardial infarction (HCC) 2017   Type 2 diabetes mellitus with hyperglycemia, with long-term current use of insulin (HCC) 09/08/2015    Past Surgical History:  Procedure Laterality Date   EYE SURGERY  1998    bilateral   SHOULDER SURGERY  1990   right shoulder, torn rotator cuff.    Family History  Problem Relation Age of Onset   Cancer Mother        cancer   Heart disease Mother    Hyperlipidemia Mother    Hypertension Mother    Hyperlipidemia Father    Stroke Father    Hypertension Father    Diabetes Paternal Grandfather     Social History   Socioeconomic History   Marital status: Widowed    Spouse name: Not on file   Number of children: 1   Years of education: Not on file   Highest education level: Not on file  Occupational History   Occupation: retired    Comment: from retail  Tobacco Use   Smoking status: Former    Pack years: 0.00    Types: Cigarettes    Quit date: 09/01/1965    Years since quitting: 55.4   Smokeless tobacco: Never  Vaping Use   Vaping Use: Never used  Substance and Sexual Activity   Alcohol use: No   Drug use: No   Sexual activity: Never  Other Topics Concern   Not on file  Social History Narrative   Regular exercise:  5 x weekly   Caffeine Use: 1 cups coffee daily.  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   Son, daughter in law and young adult granddaughter and grandson live with client   Wife died from ovarian cancer in 08-Nov-2014         Social Determinants of Health   Financial Resource Strain: Medium Risk   Difficulty of Paying Living Expenses: Somewhat hard  Food Insecurity: Not on file  Transportation Needs: No Transportation Needs   Lack of Transportation (Medical): No   Lack of Transportation (Non-Medical): No  Physical Activity: Not on file  Stress: Not on file  Social Connections: Not on file  Intimate Partner Violence: Not on file    Outpatient Medications Prior to Visit  Medication Sig Dispense Refill   apixaban (ELIQUIS) 5 MG TABS tablet TAKE 1 TABLET BY MOUTH TWICE DAILY 60 tablet 5   atorvastatin (LIPITOR) 40 MG tablet TAKE 1 TABLET (40 MG TOTAL) BY MOUTH DAILY. 90 tablet 1   Calcium Carbonate-Vitamin D 600-400 MG-UNIT  tablet Take 1 tablet by mouth 2 (two) times daily. Reported on 01/27/2016     cholecalciferol (VITAMIN D3) 25 MCG (1000 UNIT) tablet Take 1,000 Units by mouth daily.     furosemide (LASIX) 20 MG tablet TAKE 1 TABLET (20 MG TOTAL) BY MOUTH DAILY. 90 tablet 1   glucagon (GLUCAGON EMERGENCY) 1 MG injection Inject 1 mg into the muscle once as needed for up to 1 dose. 1 each 12   insulin aspart (NOVOLOG) 100 UNIT/ML injection INJECT 5-6 UNITS INTO THE SKIN 3 TIMES A DAY WITH MEALS 30 mL 5   insulin detemir (LEVEMIR) 100 UNIT/ML injection INJECT 0.18 ML (18 UNITS) INTO THE SKIN AT BEDTIME 30 mL 5   lisinopril (ZESTRIL) 5 MG tablet TAKE 1 TABLET (5 MG TOTAL) BY MOUTH DAILY. 90 tablet 0   metoprolol succinate (TOPROL-XL) 25 MG 24 hr tablet TAKE 1 TABLET (25 MG TOTAL) BY MOUTH DAILY. 90 tablet 1   TRUEPLUS INSULIN SYRINGE 31G X 5/16" 0.5 ML MISC USE TO INJECT INSULIN 4 TIMES A DAY 300 each 3   No facility-administered medications prior to visit.    Allergies  Allergen Reactions   Other Other (See Comments)    Client states he became very sick and had significant skin inflammation and irritation with the Freestyle Libre sensor    Review of Systems  Constitutional:  Negative for fever.  Respiratory:  Negative for cough.   Musculoskeletal:  Positive for joint pain (Hip pain).      Objective:    Physical Exam Constitutional:      General: He is not in acute distress.    Appearance: Normal appearance. He is not ill-appearing.  HENT:     Head: Normocephalic and atraumatic.     Right Ear: External ear normal.     Left Ear: External ear normal.  Eyes:     Extraocular Movements: Extraocular movements intact.     Pupils: Pupils are equal, round, and reactive to light.  Cardiovascular:     Rate and Rhythm: Normal rate and regular rhythm.     Pulses: Normal pulses.     Heart sounds: Normal heart sounds. No murmur heard.   No gallop.  Pulmonary:     Effort: Pulmonary effort is normal. No  respiratory distress.     Breath sounds: Examination of the right-lower field reveals rales. Rales present. No wheezing or rhonchi.  Skin:    General: Skin is warm and dry.  Neurological:     Mental Status: He is alert and oriented to  person, place, and time.  Psychiatric:        Behavior: Behavior normal.    BP 138/81 (BP Location: Right Arm, Patient Position: Sitting, Cuff Size: Small)   Pulse 98   Temp 98.4 F (36.9 C) (Oral)   Resp 16   Wt 174 lb (78.9 kg)   SpO2 98%   BMI 25.15 kg/m  Wt Readings from Last 3 Encounters:  01/23/21 174 lb (78.9 kg)  10/30/20 171 lb 6.4 oz (77.7 kg)  09/08/20 174 lb (78.9 kg)   Diabetic Foot Exam - Simple   Simple Foot Form Diabetic Foot exam was performed with the following findings: Yes 01/23/2021 10:36 AM  Visual Inspection No deformities, no ulcerations, no other skin breakdown bilaterally: Yes Sensation Testing Intact to touch and monofilament testing bilaterally: Yes Pulse Check Posterior Tibialis and Dorsalis pulse intact bilaterally: Yes Comments        Assessment & Plan:   Problem List Items Addressed This Visit       Unprioritized   Hyperlipidemia - Primary    Tolerating atorvastatin 40mg  once daily.  Obtain follow up lipid panel.        Relevant Orders   Lipid panel   Comp Met (CMET)   HTN (hypertension)    BP is stable. Continue lisinopril 5mg , toprol xl 25mg .        Chronic diastolic CHF (congestive heart failure) (HCC)    Wt Readings from Last 3 Encounters:  01/23/21 174 lb (78.9 kg)  10/30/20 171 lb 6.4 oz (77.7 kg)  09/08/20 174 lb (78.9 kg)  Weight is stable. Continue lasix 40mg  once daily.          Meds ordered this encounter  Medications   Zoster Vaccine Adjuvanted [facility]) injection    Sig: Inject 0.5 mLs into the muscle once for 1 dose.    Dispense:  0.5 mL    Refill:  1    Order Specific Question:   Supervising Provider    Answer:   Penni Homans A [[id number]]    I, [name]  NP, personally preformed the services described in this documentation.  All medical record entries made by the scribe were at my direction and in my presence.  I have reviewed the chart and discharge instructions (if applicable) and agree that the record reflects my personal performance and is accurate and complete. 01/23/2021   I,Ryan [name],acting as a Education administrator for Nance Pear, NP.,have documented all relevant documentation on the behalf of Nance Pear, NP,as directed by  Nance Pear, NP while in the presence of Nance Pear, NP.   Nance Pear, NP

## 2021-01-23 NOTE — Assessment & Plan Note (Signed)
BP is stable. Continue lisinopril 5mg , toprol xl 25mg .

## 2021-01-23 NOTE — Assessment & Plan Note (Signed)
Tolerating atorvastatin 40mg  once daily.  Obtain follow up lipid panel.

## 2021-01-23 NOTE — Assessment & Plan Note (Addendum)
New. Recommended tylenol prn.  Likely OA. Declines ortho referral.

## 2021-01-23 NOTE — Patient Instructions (Signed)
Please complete lab work prior to leaving.   

## 2021-01-30 NOTE — Progress Notes (Signed)
Subjective:   Ryan [name] is a 75 y.o. male who presents for Medicare Annual/Subsequent preventive examination.  I connected with Ryan [name] today by telephone and verified that I am speaking with the correct person using two identifiers. Location patient: home Location provider: work Persons participating in the virtual visit: patient, Engineer, civil (consulting).    I discussed the limitations, risks, security and privacy concerns of performing an evaluation and management service by telephone and the availability of in person appointments. I also discussed with the patient that there may be a patient responsible charge related to this service. The patient expressed understanding and verbally consented to this telephonic visit.    Interactive audio and video telecommunications were attempted between this provider and patient, however failed, due to patient having technical difficulties OR patient did not have access to video capability.  We continued and completed visit with audio only.  Some vital signs may be absent or patient reported.   Time Spent with patient on telephone encounter: 30 minutes   Review of Systems     Cardiac Risk Factors include: advanced age (>41men, >24 women);male gender;diabetes mellitus;dyslipidemia;hypertension     Objective:    Today's Vitals   02/02/21 1506  Weight: 174 lb (78.9 kg)  Height: 5\' 9"  (1.753 m)   Body mass index is 25.7 kg/m.  Advanced Directives 02/02/2021 01/28/2020 07/30/2019 03/06/2019 01/25/2019 11/29/2017 11/22/2016  Does Patient Have a Medical Advance Directive? Yes No Yes Yes Yes No No  Type of 11/24/2016 of [place];Living will - Healthcare Power of [place];Living will Healthcare Power of [place];Living will Healthcare Power of [place];Living will - -  Does patient want to make changes to medical advance directive? - - Yes (MAU/Ambulatory/Procedural Areas - Information given) No - Patient declined No - Patient declined - Yes  (MAU/Ambulatory/Procedural Areas - Information given)  Copy of Healthcare Power of Attorney in Chart? No - copy requested - No - copy requested - Yes - validated most recent copy scanned in chart (See row information) - -  Would patient like information on creating a medical advance directive? - No - Patient declined - - - No - Patient declined -    Current Medications (verified) Outpatient Encounter Medications as of 02/02/2021  Medication Sig   apixaban (ELIQUIS) 5 MG TABS tablet TAKE 1 TABLET BY MOUTH TWICE DAILY   atorvastatin (LIPITOR) 40 MG tablet TAKE 1 TABLET (40 MG TOTAL) BY MOUTH DAILY.   Calcium Carbonate-Vitamin D 600-400 MG-UNIT tablet Take 1 tablet by mouth 2 (two) times daily. Reported on 01/27/2016   cholecalciferol (VITAMIN D3) 25 MCG (1000 UNIT) tablet Take 1,000 Units by mouth daily.   furosemide (LASIX) 20 MG tablet TAKE 1 TABLET (20 MG TOTAL) BY MOUTH DAILY.   glucagon (GLUCAGON EMERGENCY) 1 MG injection Inject 1 mg into the muscle once as needed for up to 1 dose.   insulin aspart (NOVOLOG) 100 UNIT/ML injection INJECT 5-6 UNITS INTO THE SKIN 3 TIMES A DAY WITH MEALS   insulin detemir (LEVEMIR) 100 UNIT/ML injection INJECT 0.18 ML (18 UNITS) INTO THE SKIN AT BEDTIME   lisinopril (ZESTRIL) 5 MG tablet TAKE 1 TABLET (5 MG TOTAL) BY MOUTH DAILY.   metoprolol succinate (TOPROL-XL) 25 MG 24 hr tablet TAKE 1 TABLET (25 MG TOTAL) BY MOUTH DAILY.   TRUEPLUS INSULIN SYRINGE 31G X 5/16" 0.5 ML MISC USE TO INJECT INSULIN 4 TIMES A DAY   No facility-administered encounter medications on file as of 02/02/2021.    Allergies (verified)  Other   History: Past Medical History:  Diagnosis Date   Allergy    Diabetes mellitus    GERD (gastroesophageal reflux disease)    History of chicken pox    Hyperlipidemia    Hypertension    Myocardial infarction (HCC) Dec 08, 2015   Type 2 diabetes mellitus with hyperglycemia, with long-term current use of insulin (HCC) 09/08/2015   Past Surgical  History:  Procedure Laterality Date   EYE SURGERY  1998   bilateral   SHOULDER SURGERY  1990   right shoulder, torn rotator cuff.   Family History  Problem Relation Age of Onset   Cancer Mother        cancer   Heart disease Mother    Hyperlipidemia Mother    Hypertension Mother    Hyperlipidemia Father    Stroke Father    Hypertension Father    Diabetes Paternal Grandfather    Social History   Socioeconomic History   Marital status: Widowed    Spouse name: Not on file   Number of children: 1   Years of education: Not on file   Highest education level: Not on file  Occupational History   Occupation: retired    Comment: from retail  Tobacco Use   Smoking status: Former    Pack years: 0.00    Types: Cigarettes    Quit date: 09/01/1965    Years since quitting: 55.4   Smokeless tobacco: Never  Vaping Use   Vaping Use: Never used  Substance and Sexual Activity   Alcohol use: No   Drug use: No   Sexual activity: Never  Other Topics Concern   Not on file  Social History Narrative   Regular exercise:  5 x weekly   Caffeine Use: 1 cups coffee daily.   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   Son, daughter in law and young adult granddaughter and grandson live with client   Wife died from ovarian cancer in 2014/12/08         Social Determinants of Health   Financial Resource Strain: Medium Risk   Difficulty of Paying Living Expenses: Somewhat hard  Food Insecurity: No Food Insecurity   Worried About Programme researcher, broadcasting/film/video in the Last Year: Never true   Barista in the Last Year: Never true  Transportation Needs: No Transportation Needs   Lack of Transportation (Medical): No   Lack of Transportation (Non-Medical): No  Physical Activity: Inactive   Days of Exercise per Week: 0 days   Minutes of Exercise per Session: 0 min  Stress: No Stress Concern Present   Feeling of Stress : Not at all  Social Connections: Socially Isolated   Frequency of Communication with Friends  and Family: More than three times a week   Frequency of Social Gatherings with Friends and Family: Once a week   Attends Religious Services: Never   Database administrator or Organizations: No   Attends Banker Meetings: Never   Marital Status: Widowed    Tobacco Counseling Counseling given: Not Answered   Clinical Intake:  Pre-visit preparation completed: Yes  Pain : No/denies pain     Nutritional Status: BMI 25 -29 Overweight Nutritional Risks: None Diabetes: Yes CBG done?: No Did pt. bring in CBG monitor from home?: No (phone visit)  How often do you need to have someone help you when you read instructions, pamphlets, or other written materials from your doctor or pharmacy?: 1 - Never  Diabetes:  Is the patient  diabetic?  Yes  If diabetic, was a CBG obtained today?  No  Did the patient bring in their glucometer from home?  No phone visit How often do you monitor your CBG's? Several times per day.   Financial Strains and Diabetes Management:  Are you having any financial strains with the device, your supplies or your medication? No .  Does the patient want to be seen by Chronic Care Management for management of their diabetes?  No  Would the patient like to be referred to a Nutritionist or for Diabetic Management?  No   Diabetic Exams:  Diabetic Eye Exam:. Overdue for diabetic eye exam. Pt has been advised about the importance in completing this exam. Patient states he is waiting for the Ophthalmology office to call him to schedule.  Diabetic Foot Exam: Completed 01/23/2021.   Interpreter Needed?: No  Information entered by :: Thomasenia Sales LPN   Activities of Daily Living In your present state of health, do you have any difficulty performing the following activities: 02/02/2021  Hearing? N  Vision? N  Difficulty concentrating or making decisions? N  Walking or climbing stairs? N  Dressing or bathing? N  Doing errands, shopping? N  Preparing  Food and eating ? N  Using the Toilet? N  In the past six months, have you accidently leaked urine? N  Do you have problems with loss of bowel control? N  Managing your Medications? N  Managing your Finances? N  Housekeeping or managing your Housekeeping? N  Some recent data might be hidden    Patient Care Team: Sandford Craze, NP as PCP - General (Internal Medicine) Pricilla Riffle, MD as PCP - Cardiology (Cardiology) Carlus Pavlov, MD as Consulting Physician (Endocrinology) Pricilla Riffle, MD as Consulting Physician (Cardiology) Henrene Pastor, PharmD (Pharmacist)  Indicate any recent Medical Services you may have received from other than [facility] providers in the past year (date may be approximate).     Assessment:   This is a routine wellness examination for Ryan [name].  Hearing/Vision screen Hearing Screening - Comments:: No issues Vision Screening - Comments:: Last eye exam-2021-Dr.Groat  Dietary issues and exercise activities discussed: Current Exercise Habits: The patient does not participate in regular exercise at present, Exercise limited by: orthopedic condition(s) (hip pain)   Goals Addressed   None    Depression Screen PHQ 2/9 Scores 02/02/2021 01/23/2021 01/28/2020 01/21/2020 03/06/2019 01/25/2019 11/29/2017  PHQ - 2 Score 0 0 0 0 0 0 0    Fall Risk Fall Risk  02/02/2021 01/23/2021 01/28/2020 01/25/2019 11/29/2017  Falls in the past year? 0 0 0 0 No  Number falls in past yr: 0 0 0 - -  Injury with Fall? 0 0 0 - -  Follow up Falls prevention discussed - Education provided;Falls prevention discussed - -    FALL RISK PREVENTION PERTAINING TO THE HOME:  Any stairs in or around the home? Yes  If so, are there any without handrails? No  Home free of loose throw rugs in walkways, pet beds, electrical cords, etc? Yes  Adequate lighting in your home to reduce risk of falls? Yes   ASSISTIVE DEVICES UTILIZED TO PREVENT FALLS:  Life alert? No  Use of a cane, walker or w/c?  No  Grab bars in the bathroom? Yes  Shower chair or bench in shower? Yes  Elevated toilet seat or a handicapped toilet? No   TIMED UP AND GO:  Was the test performed? Yes . Phone visit   Cognitive  Function:Normal cognitive status assessed by this Nurse Health Advisor. No abnormalities found.   MMSE - Mini Mental State Exam 11/29/2017 11/22/2016  Orientation to time 5 5  Orientation to Place 5 5  Registration 3 3  Attention/ Calculation 5 5  Recall 3 2  Language- name 2 objects 2 2  Language- repeat 1 1  Language- follow 3 step command 3 3  Language- read & follow direction 1 1  Write a sentence 1 1  Copy design 1 0  Total score 30 28        Immunizations Immunization History  Administered Date(s) Administered   Fluad Quad(high Dose 65+) 05/15/2019, 04/25/2020   H1N1 11/19/2008   Influenza Split 05/25/2011, 04/20/2012   Influenza Whole 06/06/2009   Influenza, High Dose Seasonal PF 04/20/2016, 05/12/2017, 05/26/2018   Influenza,inj,Quad PF,6+ Mos 05/07/2013, 05/27/2014, 05/13/2015   PFIZER(Purple Top)SARS-COV-2 Vaccination 09/30/2019, 10/24/2019, 07/22/2020   Pneumococcal Conjugate-13 10/19/2004, 05/11/2013   Pneumococcal Polysaccharide-23 05/25/2011, 09/08/2018   Td 08/09/2001, 08/25/2011   Zoster Recombinat (Shingrix) 01/22/2021   Zoster, Live 08/25/2011    TDAP status: Up to date  Flu Vaccine status: Up to date  Pneumococcal vaccine status: Up to date  Covid-19 vaccine status: Completed vaccines  Qualifies for Shingles Vaccine? Yes   Zostavax completed Yes   Shingrix Completed?: No.    Education has been provided regarding the importance of this vaccine. Patient has been advised to call insurance company to determine out of pocket expense if they have not yet received this vaccine. Advised may also receive vaccine at local pharmacy or Health Dept. Verbalized acceptance and understanding.  Screening Tests Health Maintenance  Topic Date Due   COVID-19 Vaccine  (4 - Booster for Pfizer series) 11/20/2020   OPHTHALMOLOGY EXAM  01/23/2021   INFLUENZA VACCINE  03/09/2021   Zoster Vaccines- Shingrix (2 of 2) 03/19/2021   HEMOGLOBIN A1C  05/02/2021   TETANUS/TDAP  08/24/2021   Fecal DNA (Cologuard)  09/27/2021   FOOT EXAM  01/23/2022   Hepatitis C Screening  Completed   PNA vac Low Risk Adult  Completed   HPV VACCINES  Aged Out    Health Maintenance  Health Maintenance Due  Topic Date Due   COVID-19 Vaccine (4 - Booster for Pfizer series) 11/20/2020   OPHTHALMOLOGY EXAM  01/23/2021    Colorectal cancer screening: Type of screening: Cologuard. Completed 09/27/2018. Repeat every 3 years  Lung Cancer Screening: (Low Dose CT Chest recommended if Age 42-80 years, 30 pack-year currently smoking OR have quit w/in 15years.) does not qualify.    Additional Screening:  Hepatitis C Screening: Completed 05/26/2011  Vision Screening: Recommended annual ophthalmology exams for early detection of glaucoma and other disorders of the eye. Is the patient up to date with their annual eye exam?  No  Who is the provider or what is the name of the office in which the patient attends annual eye exams? Dr. Dione BoozeGroat   Dental Screening: Recommended annual dental exams for proper oral hygiene  Community Resource Referral / Chronic Care Management: CRR required this visit?  No   CCM required this visit?  No      Plan:     I have personally reviewed and noted the following in the patient's chart:   Medical and social history Use of alcohol, tobacco or illicit drugs  Current medications and supplements including opioid prescriptions. Patient is not currently taking opioid prescriptions. Functional ability and status Nutritional status Physical activity Advanced directives List of other physicians Hospitalizations,  surgeries, and ER visits in previous 12 months Vitals Screenings to include cognitive, depression, and falls Referrals and appointments  In  addition, I have reviewed and discussed with patient certain preventive protocols, quality metrics, and best practice recommendations. A written personalized care plan for preventive services as well as general preventive health recommendations were provided to patient.   Due to this being a telephonic visit, the after visit summary with patients personalized plan was offered to patient via mail or my-chart. Patient would like to access on my-chart.   Roanna Raider, LPN   09/18/3126  Nurse Health Advisor  Nurse Notes: None

## 2021-02-02 ENCOUNTER — Ambulatory Visit (INDEPENDENT_AMBULATORY_CARE_PROVIDER_SITE_OTHER): Payer: HMO

## 2021-02-02 VITALS — Ht 69.0 in | Wt 174.0 lb

## 2021-02-02 DIAGNOSIS — Z0000 Encounter for general adult medical examination without abnormal findings: Secondary | ICD-10-CM | POA: Diagnosis not present

## 2021-02-02 NOTE — Patient Instructions (Signed)
Ryan [name] , Thank you for taking time to complete your Medicare Wellness Visit. I appreciate your ongoing commitment to your health goals. Please review the following plan we discussed and let me know if I can assist you in the future.   Screening recommendations/referrals: Colonoscopy: Cologuard completed 09/27/2018-Due 09/27/2021 Recommended yearly ophthalmology/optometry visit for glaucoma screening and checkup Recommended yearly dental visit for hygiene and checkup  Vaccinations: Influenza vaccine: Up to date Pneumococcal vaccine: Up to date Tdap vaccine: Up to date-Due-08/24/2021 Shingles vaccine: Completed  first dose Covid-19: 2nd booster due  Advanced directives: Please bring a copy for your chart  Conditions/risks identified: See problem list  Next appointment: Follow up in one year for your annual wellness visit.   Preventive Care 75 Years and Older, Male Preventive care refers to lifestyle choices and visits with your health care provider that can promote health and wellness. What does preventive care include? A yearly physical exam. This is also called an annual well check. Dental exams once or twice a year. Routine eye exams. Ask your health care provider how often you should have your eyes checked. Personal lifestyle choices, including: Daily care of your teeth and gums. Regular physical activity. Eating a healthy diet. Avoiding tobacco and drug use. Limiting alcohol use. Practicing safe sex. Taking low doses of aspirin every day. Taking vitamin and mineral supplements as recommended by your health care provider. What happens during an annual well check? The services and screenings done by your health care provider during your annual well check will depend on your age, overall health, lifestyle risk factors, and family history of disease. Counseling  Your health care provider may ask you questions about your: Alcohol use. Tobacco use. Drug use. Emotional  well-being. Home and relationship well-being. Sexual activity. Eating habits. History of falls. Memory and ability to understand (cognition). Work and work Astronomer. Screening  You may have the following tests or measurements: Height, weight, and BMI. Blood pressure. Lipid and cholesterol levels. These may be checked every 5 years, or more frequently if you are over 72 years old. Skin check. Lung cancer screening. You may have this screening every year starting at age 43 if you have a 30-pack-year history of smoking and currently smoke or have quit within the past 15 years. Fecal occult blood test (FOBT) of the stool. You may have this test every year starting at age 59. Flexible sigmoidoscopy or colonoscopy. You may have a sigmoidoscopy every 5 years or a colonoscopy every 10 years starting at age 41. Prostate cancer screening. Recommendations will vary depending on your family history and other risks. Hepatitis C blood test. Hepatitis B blood test. Sexually transmitted disease (STD) testing. Diabetes screening. This is done by checking your blood sugar (glucose) after you have not eaten for a while (fasting). You may have this done every 1-3 years. Abdominal aortic aneurysm (AAA) screening. You may need this if you are a current or former smoker. Osteoporosis. You may be screened starting at age 93 if you are at high risk. Talk with your health care provider about your test results, treatment options, and if necessary, the need for more tests. Vaccines  Your health care provider may recommend certain vaccines, such as: Influenza vaccine. This is recommended every year. Tetanus, diphtheria, and acellular pertussis (Tdap, Td) vaccine. You may need a Td booster every 10 years. Zoster vaccine. You may need this after age 64. Pneumococcal 13-valent conjugate (PCV13) vaccine. One dose is recommended after age 58. Pneumococcal polysaccharide (PPSV23) vaccine.  One dose is recommended after  age 47. Talk to your health care provider about which screenings and vaccines you need and how often you need them. This information is not intended to replace advice given to you by your health care provider. Make sure you discuss any questions you have with your health care provider. Document Released: 08/22/2015 Document Revised: 04/14/2016 Document Reviewed: 05/27/2015 Elsevier Interactive Patient Education  2017 [place] Prevention in the Home Falls can cause injuries. They can happen to people of all ages. There are many things you can do to make your home safe and to help prevent falls. What can I do on the outside of my home? Regularly fix the edges of walkways and driveways and fix any cracks. Remove anything that might make you trip as you walk through a door, such as a raised step or threshold. Trim any bushes or trees on the path to your home. Use bright outdoor lighting. Clear any walking paths of anything that might make someone trip, such as rocks or tools. Regularly check to see if handrails are loose or broken. Make sure that both sides of any steps have handrails. Any raised decks and porches should have guardrails on the edges. Have any leaves, snow, or ice cleared regularly. Use sand or salt on walking paths during winter. Clean up any spills in your garage right away. This includes oil or grease spills. What can I do in the bathroom? Use night lights. Install grab bars by the toilet and in the tub and shower. Do not use towel bars as grab bars. Use non-skid mats or decals in the tub or shower. If you need to sit down in the shower, use a plastic, non-slip stool. Keep the floor dry. Clean up any water that spills on the floor as soon as it happens. Remove soap buildup in the tub or shower regularly. Attach bath mats securely with double-sided non-slip rug tape. Do not have throw rugs and other things on the floor that can make you trip. What can I do in the  bedroom? Use night lights. Make sure that you have a light by your bed that is easy to reach. Do not use any sheets or blankets that are too big for your bed. They should not hang down onto the floor. Have a firm chair that has side arms. You can use this for support while you get dressed. Do not have throw rugs and other things on the floor that can make you trip. What can I do in the kitchen? Clean up any spills right away. Avoid walking on wet floors. Keep items that you use a lot in easy-to-reach places. If you need to reach something above you, use a strong step stool that has a grab bar. Keep electrical cords out of the way. Do not use floor polish or wax that makes floors slippery. If you must use wax, use non-skid floor wax. Do not have throw rugs and other things on the floor that can make you trip. What can I do with my stairs? Do not leave any items on the stairs. Make sure that there are handrails on both sides of the stairs and use them. Fix handrails that are broken or loose. Make sure that handrails are as long as the stairways. Check any carpeting to make sure that it is firmly attached to the stairs. Fix any carpet that is loose or worn. Avoid having throw rugs at the top or bottom of  the stairs. If you do have throw rugs, attach them to the floor with carpet tape. Make sure that you have a light switch at the top of the stairs and the bottom of the stairs. If you do not have them, ask someone to add them for you. What else can I do to help prevent falls? Wear shoes that: Do not have high heels. Have rubber bottoms. Are comfortable and fit you well. Are closed at the toe. Do not wear sandals. If you use a stepladder: Make sure that it is fully opened. Do not climb a closed stepladder. Make sure that both sides of the stepladder are locked into place. Ask someone to hold it for you, if possible. Clearly mark and make sure that you can see: Any grab bars or  handrails. First and last steps. Where the edge of each step is. Use tools that help you move around (mobility aids) if they are needed. These include: Canes. Walkers. Scooters. Crutches. Turn on the lights when you go into a dark area. Replace any light bulbs as soon as they burn out. Set up your furniture so you have a clear path. Avoid moving your furniture around. If any of your floors are uneven, fix them. If there are any pets around you, be aware of where they are. Review your medicines with your doctor. Some medicines can make you feel dizzy. This can increase your chance of falling. Ask your doctor what other things that you can do to help prevent falls. This information is not intended to replace advice given to you by your health care provider. Make sure you discuss any questions you have with your health care provider. Document Released: 05/22/2009 Document Revised: 01/01/2016 Document Reviewed: 08/30/2014 Elsevier Interactive Patient Education  2017 [employer].

## 2021-02-05 ENCOUNTER — Other Ambulatory Visit (HOSPITAL_BASED_OUTPATIENT_CLINIC_OR_DEPARTMENT_OTHER): Payer: Self-pay

## 2021-02-05 ENCOUNTER — Other Ambulatory Visit: Payer: Self-pay | Admitting: Family

## 2021-02-05 DIAGNOSIS — I10 Essential (primary) hypertension: Secondary | ICD-10-CM

## 2021-02-06 ENCOUNTER — Other Ambulatory Visit (HOSPITAL_BASED_OUTPATIENT_CLINIC_OR_DEPARTMENT_OTHER): Payer: Self-pay

## 2021-02-06 MED ORDER — ATORVASTATIN CALCIUM 40 MG PO TABS
40.0000 mg | ORAL_TABLET | Freq: Every day | ORAL | 1 refills | Status: DC
  Filled 2021-02-06: qty 90, 90d supply, fill #0
  Filled 2021-05-11: qty 90, 90d supply, fill #1

## 2021-02-06 MED ORDER — LISINOPRIL 5 MG PO TABS
ORAL_TABLET | Freq: Every day | ORAL | 0 refills | Status: DC
  Filled 2021-02-06: qty 90, 90d supply, fill #0

## 2021-02-06 MED ORDER — METOPROLOL SUCCINATE ER 25 MG PO TB24
ORAL_TABLET | Freq: Every day | ORAL | 1 refills | Status: DC
  Filled 2021-02-06: qty 90, 90d supply, fill #0
  Filled 2021-05-11: qty 90, 90d supply, fill #1

## 2021-02-06 MED ORDER — FUROSEMIDE 20 MG PO TABS
ORAL_TABLET | Freq: Every day | ORAL | 1 refills | Status: DC
  Filled 2021-02-06: qty 90, 90d supply, fill #0
  Filled 2021-05-11: qty 90, 90d supply, fill #1

## 2021-02-10 ENCOUNTER — Other Ambulatory Visit (HOSPITAL_BASED_OUTPATIENT_CLINIC_OR_DEPARTMENT_OTHER): Payer: Self-pay

## 2021-02-10 MED FILL — Apixaban Tab 5 MG: ORAL | 30 days supply | Qty: 60 | Fill #3 | Status: AC

## 2021-02-19 ENCOUNTER — Telehealth: Payer: Self-pay | Admitting: Pharmacist

## 2021-02-19 NOTE — Chronic Care Management (AMB) (Signed)
    Chronic Care Management Pharmacy Assistant   Name: Ryan [name]  MRN: [id number] DOB: May 09, 1946  Reason for Encounter: Patient Assistance Documentation  Medications: Outpatient Encounter Medications as of 02/19/2021  Medication Sig Note   apixaban (ELIQUIS) 5 MG TABS tablet TAKE 1 TABLET BY MOUTH TWICE DAILY    atorvastatin (LIPITOR) 40 MG tablet TAKE 1 TABLET (40 MG TOTAL) BY MOUTH DAILY.    Calcium Carbonate-Vitamin D 600-400 MG-UNIT tablet Take 1 tablet by mouth 2 (two) times daily. Reported on 01/27/2016    cholecalciferol (VITAMIN D3) 25 MCG (1000 UNIT) tablet Take 1,000 Units by mouth daily.    furosemide (LASIX) 20 MG tablet TAKE 1 TABLET (20 MG TOTAL) BY MOUTH DAILY.    glucagon (GLUCAGON EMERGENCY) 1 MG injection Inject 1 mg into the muscle once as needed for up to 1 dose.    insulin aspart (NOVOLOG) 100 UNIT/ML injection INJECT 5-6 UNITS INTO THE SKIN 3 TIMES A DAY WITH MEALS 01/14/2021: Approved to get from [facility] PAP thru 08/08/2021   insulin detemir (LEVEMIR) 100 UNIT/ML injection INJECT 0.18 ML (18 UNITS) INTO THE SKIN AT BEDTIME 01/14/2021: Approved to get thru [facility] P[facility] thru 08/08/21   lisinopril (ZESTRIL) 5 MG tablet TAKE 1 TABLET (5 MG TOTAL) BY MOUTH DAILY.    metoprolol succinate (TOPROL-XL) 25 MG 24 hr tablet TAKE 1 TABLET (25 MG TOTAL) BY MOUTH DAILY.    TRUEPLUS INSULIN SYRINGE 31G X 5/16" 0.5 ML MISC USE TO INJECT INSULIN 4 TIMES A DAY    No facility-administered encounter medications on file as of 02/19/2021.   I called and spoke with [name] to check his progress with meeting the 3% OOP expense to qualify for Eliquis by [facility]. He stated that he has not met this expense and will need to refill a few more times before he is able to qualify. We will follow up in 1-2 months to check his progress again.   Wilford Sports CPA,CMA

## 2021-02-26 ENCOUNTER — Other Ambulatory Visit: Payer: Self-pay

## 2021-02-26 ENCOUNTER — Other Ambulatory Visit (HOSPITAL_BASED_OUTPATIENT_CLINIC_OR_DEPARTMENT_OTHER): Payer: Self-pay

## 2021-02-26 ENCOUNTER — Ambulatory Visit (INDEPENDENT_AMBULATORY_CARE_PROVIDER_SITE_OTHER): Payer: HMO | Admitting: Internal Medicine

## 2021-02-26 ENCOUNTER — Encounter: Payer: Self-pay | Admitting: Internal Medicine

## 2021-02-26 VITALS — BP 128/80 | HR 61 | Ht 69.0 in | Wt 169.0 lb

## 2021-02-26 DIAGNOSIS — E663 Overweight: Secondary | ICD-10-CM | POA: Diagnosis not present

## 2021-02-26 DIAGNOSIS — E785 Hyperlipidemia, unspecified: Secondary | ICD-10-CM

## 2021-02-26 DIAGNOSIS — E1165 Type 2 diabetes mellitus with hyperglycemia: Secondary | ICD-10-CM | POA: Diagnosis not present

## 2021-02-26 DIAGNOSIS — E1159 Type 2 diabetes mellitus with other circulatory complications: Secondary | ICD-10-CM

## 2021-02-26 LAB — POCT GLYCOSYLATED HEMOGLOBIN (HGB A1C): Hemoglobin A1C: 6.5 % — AB (ref 4.0–5.6)

## 2021-02-26 MED ORDER — INSULIN DETEMIR 100 UNIT/ML ~~LOC~~ SOLN
SUBCUTANEOUS | 5 refills | Status: DC
Start: 2021-02-26 — End: 2022-05-28

## 2021-02-26 MED ORDER — GLUCAGON EMERGENCY 1 MG IJ KIT
1.0000 mg | PACK | Freq: Once | INTRAMUSCULAR | 12 refills | Status: DC | PRN
  Filled 2021-02-26: qty 1, 1d supply, fill #0

## 2021-02-26 NOTE — Patient Instructions (Addendum)
Please decrease: - Levemir 12 units at bedtime - Novolog mealtime 4-6 units 3x a day before meals  - Novolog sliding scale as follows:  - 150-175: + 1 unit  - 176-200: + 2 units  - 201-225: + 3 units  - 226-250: + 4 units  - >250: + 5 units  Please return in 4 months with your sugar log.
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
## 2021-03-04 ENCOUNTER — Telehealth: Payer: Self-pay | Admitting: *Deleted

## 2021-03-04 NOTE — Telephone Encounter (Signed)
   [facility] Pre-operative Risk Assessment    Patient Name: Ryan [name]  DOB: [birth year] MRN: [id number]  HEARTCARE STAFF:  - IMPORTANT!!!!!! Under Visit Info/Reason for Call, type in Other and utilize the format Clearance MM/DD/YY or Clearance TBD. Do not use dashes or single digits. - Please review there is not already an duplicate clearance open for this procedure. - If request is for dental extraction, please clarify the # of teeth to be extracted. - If the patient is currently at the dentist's office, call Pre-Op Callback Staff (MA/nurse) to input urgent request.  - If the patient is not currently in the dentist office, please route to the Pre-Op pool.  Request for surgical clearance:  What type of surgery is being performed?  EXTRACTIONS / SURGERY?  WAITING ON A CALL BACK  When is this surgery scheduled?  TBD  What type of clearance is required (medical clearance vs. Pharmacy clearance to hold med vs. Both)?  BOTH  Are there any medications that need to be held prior to surgery and how long?  [place] name and name of physician performing surgery?  [facility]   What is the office phone number?  [phone]   7.   What is the office fax number?  [phone]  8.   Anesthesia type (None, local, MAC, general) ?    Jeanann Lewandowsky 03/04/2021, 9:46 AM  _________________________________________________________________   (provider comments below)

## 2021-03-05 NOTE — Telephone Encounter (Signed)
I s/w dental office today and was able to get clarification. Pt will be having 20 teeth surgically extracted under deep sedation. I will send back to pre op pool for further review and recommendations.

## 2021-03-11 NOTE — Telephone Encounter (Signed)
Clinical pharmacist to review Eliquis 

## 2021-03-12 NOTE — Telephone Encounter (Signed)
    Patient Name: Ryan [name]  DOB: Aug 05, 1946 MRN: [id number]  Primary Cardiologist: Dietrich Pates, MD  Chart reviewed as part of pre-operative protocol coverage. Given past medical history and time since last visit, based on ACC/AHA guidelines, Ryan [name] would be at acceptable risk for the planned procedure without further cardiovascular testing.   Patient with diagnosis of aflutter on Eliquis for anticoagulation.     Procedure: 20 teeth surgically extracted under deep sedation Date of procedure: TBD   CHA2DS2-VASc Score = 4  This indicates a 4.8% annual risk of stroke. The patient's score is based upon: CHF History: Yes HTN History: Yes Diabetes History: Yes Stroke History: No Vascular Disease History: No Age Score: 1 Gender Score: 0    CrCl 56.8 ml/min   Patient does NOT require pre-op antibiotics for dental procedure.   Per office protocol, patient can hold Eliquis for 1 days prior to procedure.    The patient was advised that if he develops new symptoms prior to surgery to contact our office to arrange for a follow-up visit, and he verbalized understanding.  I will route this recommendation to the requesting party via Epic fax function and remove from pre-op pool.  Please call with questions.  Georgie Chard, NP 03/12/2021, 11:41 AM

## 2021-03-12 NOTE — Telephone Encounter (Signed)
Patient with diagnosis of aflutter on Eliquis for anticoagulation.    Procedure: 20 teeth surgically extracted under deep sedation Date of procedure: TBD  CHA2DS2-VASc Score = 4  This indicates a 4.8% annual risk of stroke. The patient's score is based upon: CHF History: Yes HTN History: Yes Diabetes History: Yes Stroke History: No Vascular Disease History: No Age Score: 1 Gender Score: 0    CrCl 56.8 ml/min  Patient does NOT require pre-op antibiotics for dental procedure.  Per office protocol, patient can hold Eliquis for 1 days prior to procedure.

## 2021-03-16 ENCOUNTER — Other Ambulatory Visit (HOSPITAL_BASED_OUTPATIENT_CLINIC_OR_DEPARTMENT_OTHER): Payer: Self-pay

## 2021-03-16 ENCOUNTER — Other Ambulatory Visit: Payer: Self-pay | Admitting: Internal Medicine

## 2021-03-16 MED ORDER — APIXABAN 5 MG PO TABS
ORAL_TABLET | Freq: Two times a day (BID) | ORAL | 5 refills | Status: DC
  Filled 2021-03-16: qty 60, 30d supply, fill #0
  Filled 2021-04-16: qty 60, 30d supply, fill #1
  Filled 2021-05-18: qty 60, 30d supply, fill #2
  Filled 2021-06-16: qty 60, 30d supply, fill #3
  Filled 2021-07-17: qty 60, 30d supply, fill #4
  Filled 2021-08-19: qty 60, 30d supply, fill #5

## 2021-03-16 NOTE — Telephone Encounter (Signed)
Eliquis mg refill request received. Patient is 75 years old, weight- 76.7 kg, Crea- 1.14, and last seen by PR on 09/08/20. Dose is appropriate based on dosing criteria. Will send in refill to requested pharmacy.

## 2021-03-18 ENCOUNTER — Other Ambulatory Visit (HOSPITAL_BASED_OUTPATIENT_CLINIC_OR_DEPARTMENT_OTHER): Payer: Self-pay

## 2021-03-18 MED ORDER — "INSULIN SYRINGE-NEEDLE U-100 31G X 5/16"" 0.5 ML MISC"
3 refills | Status: AC
  Filled 2021-03-18: qty 300, 75d supply, fill #0

## 2021-03-19 ENCOUNTER — Other Ambulatory Visit (HOSPITAL_BASED_OUTPATIENT_CLINIC_OR_DEPARTMENT_OTHER): Payer: Self-pay

## 2021-03-20 ENCOUNTER — Other Ambulatory Visit (HOSPITAL_BASED_OUTPATIENT_CLINIC_OR_DEPARTMENT_OTHER): Payer: Self-pay

## 2021-03-23 ENCOUNTER — Other Ambulatory Visit (HOSPITAL_BASED_OUTPATIENT_CLINIC_OR_DEPARTMENT_OTHER): Payer: Self-pay

## 2021-03-27 ENCOUNTER — Ambulatory Visit (INDEPENDENT_AMBULATORY_CARE_PROVIDER_SITE_OTHER): Payer: HMO | Admitting: Pharmacist

## 2021-03-27 DIAGNOSIS — E7800 Pure hypercholesterolemia, unspecified: Secondary | ICD-10-CM | POA: Diagnosis not present

## 2021-03-27 DIAGNOSIS — I10 Essential (primary) hypertension: Secondary | ICD-10-CM | POA: Diagnosis not present

## 2021-03-27 DIAGNOSIS — E119 Type 2 diabetes mellitus without complications: Secondary | ICD-10-CM | POA: Diagnosis not present

## 2021-03-27 DIAGNOSIS — I5032 Chronic diastolic (congestive) heart failure: Secondary | ICD-10-CM | POA: Diagnosis not present

## 2021-03-27 DIAGNOSIS — Z794 Long term (current) use of insulin: Secondary | ICD-10-CM

## 2021-03-27 NOTE — Patient Instructions (Signed)
Visit Information  PATIENT GOALS:  Goals Addressed             This Visit's Progress    Chronic Care Management Pharmacy Care Plan   On track    CARE PLAN ENTRY (see longitudinal plan of care for additional care plan information)  Current Barriers:  Chronic Disease Management support, education, and care coordination needs related to Afib/flutter, diabetes, HTN - high blood pressure, hyperlipidemia - high cholesterol, GERD - acid reflux / heartburn, Osteopenia, Gout   Hypertension BP Readings from Last 3 Encounters:  02/26/21 128/80  01/23/21 138/81  10/30/20 120/82  Pharmacist Clinical Goal(s): Over the next 90 days, patient will work with PharmD and providers to maintain BP goal <140/90 Current regimen:  Lisinopril 5mg  daily Metoprolol succinate 25mg  daily Patient self care activities - Over the next 90 days, patient will: Check blood pressure daily, document, and provide at future appointments Ensure daily salt intake < 2300 mg/day  Hyperlipidemia Lab Results  Component Value Date/Time   LDLCALC 73 01/23/2021 10:35 AM  Pharmacist Clinical Goal(s): Over the next 90 days, patient will work with PharmD and providers to achieve LDL goal < 70 Current regimen:  Atorvastatin 40mg  daily Patient self care activities - Over the next 90 days, patient will: Maintain cholesterol medication regimen.   Diabetes Lab Results  Component Value Date/Time   HGBA1C 6.5 (A) 02/26/2021 10:35 AM   HGBA1C 6.4 (A) 10/30/2020 02:28 PM   HGBA1C 7.2 (H) 10/09/2019 01:15 PM   HGBA1C 7.9 (H) 01/27/2016 03:18 PM  Pharmacist Clinical Goal(s): Over the next 90 days, patient will work with PharmD and providers to maintain A1c goal <7% and prevent hypoglycemia Current regimen:  Novolog 4 to 6 units 3 times a day w/ meals Levemir 12 units at bedtime Interventions: Coordinated completion of patient assistance for 11/01/2020 - Novolog and Levemir (completed at previous visit)   Patient self care  activities - Over the next 90 days, patient will: Check blood sugar 3-4 times daily, document, and provide at future appointments Contact provider with any episodes of hypoglycemia  Atrial Flutter:  Pharmacist Clinical Goal(s) Over the next 90 days, patient will work with PharmD and providers to prevent stroke and control heart rate Current treatment:  Eliquis 5mg  twice a day Metoprolol succinate 25mg  daily Interventions: Coordinated with pharmacy to get current out of pocket spend for 2022. Not at required OOP spend for Eliquis patient assistance application. Has to spend 3% of income in 2022. Will plan to follow up with patient in July to assess eligibility again Patient self care activities - Over the next 90 days, patient will: Continue current therapy for atrial fibrillation   Health Maintenance  Pharmacist Clinical Goal(s) Over the next 90 days, patient will work with PharmD and providers to complete health maintenance screenings/vaccinations Interventions: Discussed completing Shingrix vaccine series Reminded to get annual flu vaccine Patient self care activities - Over the next 90 days, patient will: Complete Shingrix vaccine series - due anytime from now until 07/24/2021  Get annual flu vaccine  Medication management Pharmacist Clinical Goal(s): Over the next 90 days, patient will work with PharmD and providers to maintain optimal medication adherence Current pharmacy: Pharmacy Interventions Comprehensive medication review performed. Continue current medication management strategy Patient self care activities - Over the next 90 days, patient will: Focus on medication adherence by filling and taking medications appropriately  Take medications as prescribed Report any questions or concerns to PharmD and/or provider(s)  Please  see past updates related to this goal by clicking on the "Past Updates" button in the selected goal          The patient  verbalized understanding of instructions, educational materials, and care plan provided today and declined offer to receive copy of patient instructions, educational materials, and care plan.   Telephone follow up appointment with care management team member scheduled for: 3 to 4 months  Henrene Pastor, PharmD Clinical Pharmacist [facility]
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
## 2021-04-16 ENCOUNTER — Other Ambulatory Visit (HOSPITAL_BASED_OUTPATIENT_CLINIC_OR_DEPARTMENT_OTHER): Payer: Self-pay

## 2021-04-17 ENCOUNTER — Other Ambulatory Visit (HOSPITAL_BASED_OUTPATIENT_CLINIC_OR_DEPARTMENT_OTHER): Payer: Self-pay

## 2021-04-27 ENCOUNTER — Other Ambulatory Visit (HOSPITAL_BASED_OUTPATIENT_CLINIC_OR_DEPARTMENT_OTHER): Payer: Self-pay

## 2021-05-11 ENCOUNTER — Other Ambulatory Visit: Payer: Self-pay | Admitting: Family

## 2021-05-11 ENCOUNTER — Other Ambulatory Visit (HOSPITAL_BASED_OUTPATIENT_CLINIC_OR_DEPARTMENT_OTHER): Payer: Self-pay

## 2021-05-11 DIAGNOSIS — I10 Essential (primary) hypertension: Secondary | ICD-10-CM

## 2021-05-11 MED ORDER — LISINOPRIL 5 MG PO TABS
ORAL_TABLET | Freq: Every day | ORAL | 0 refills | Status: DC
  Filled 2021-05-11: qty 90, 90d supply, fill #0

## 2021-05-12 ENCOUNTER — Ambulatory Visit: Payer: HMO | Admitting: Physician Assistant

## 2021-05-18 ENCOUNTER — Other Ambulatory Visit (HOSPITAL_BASED_OUTPATIENT_CLINIC_OR_DEPARTMENT_OTHER): Payer: Self-pay

## 2021-05-20 ENCOUNTER — Other Ambulatory Visit (HOSPITAL_BASED_OUTPATIENT_CLINIC_OR_DEPARTMENT_OTHER): Payer: Self-pay

## 2021-05-21 ENCOUNTER — Telehealth: Payer: Self-pay | Admitting: Pharmacist

## 2021-05-21 NOTE — Chronic Care Management (AMB) (Signed)
    Chronic Care Management Pharmacy Assistant   Name: Ryan [name]  MRN: [id number] DOB: 04/13/1946  Reason for Encounter: Disease State Diabetes Mellitus  Recent office visits:  None noted  Recent consult visits:  None noted  Hospital visits:  None in previous 6 months  Medications: Outpatient Encounter Medications as of 05/21/2021  Medication Sig Note   apixaban (ELIQUIS) 5 MG TABS tablet TAKE 1 TABLET BY MOUTH TWICE DAILY    atorvastatin (LIPITOR) 40 MG tablet TAKE 1 TABLET (40 MG TOTAL) BY MOUTH DAILY.    Calcium Carbonate-Vitamin D 600-400 MG-UNIT tablet Take 1 tablet by mouth 2 (two) times daily. Reported on 01/27/2016    cholecalciferol (VITAMIN D3) 25 MCG (1000 UNIT) tablet Take 1,000 Units by mouth daily.    furosemide (LASIX) 20 MG tablet TAKE 1 TABLET (20 MG TOTAL) BY MOUTH DAILY.    Glucagon, rDNA, (GLUCAGON EMERGENCY) 1 MG KIT Inject 1 mg into the muscle once as needed for up to 1 dose.    insulin aspart (NOVOLOG) 100 UNIT/ML injection INJECT 5-6 UNITS INTO THE SKIN 3 TIMES A DAY WITH MEALS 01/14/2021: Approved to get from [facility] PAP thru 08/08/2021   insulin detemir (LEVEMIR) 100 UNIT/ML injection INJECT 0.12 ML (12 UNITS) INTO THE SKIN AT BEDTIME 03/27/2021: Approved thru [place] PAP thru 08/08/2021   Insulin Syringe-Needle U-100 (TRUEPLUS INSULIN SYRINGE) 31G X 5/16" 0.5 ML MISC USE TO INJECT INSULIN 4 TIMES A DAY    lisinopril (ZESTRIL) 5 MG tablet TAKE 1 TABLET (5 MG TOTAL) BY MOUTH DAILY.    metoprolol succinate (TOPROL-XL) 25 MG 24 hr tablet TAKE 1 TABLET (25 MG TOTAL) BY MOUTH DAILY.    Zoster Vaccine Adjuvanted [employer] [employer]) injection Inject 0.5 mLs into the muscle once for 1 dose.    No facility-administered encounter medications on file as of 05/21/2021.   Current antihyperglycemic regimen:  Novolog 4 to 6 units three times a day w/ meals Levemir 12 units at bedtime  What recent interventions/DTPs have been made to improve glycemic control:  None  noted  Have there been any recent hospitalizations or ED visits since last visit with CPP? No  Patient denies hypoglycemic symptoms, including Pale, Sweaty, Shaky, Hungry, Nervous/irritable, and Vision changes  Patient denies hyperglycemic symptoms, including blurry vision, excessive thirst, fatigue, polyuria, and weakness  How often are you checking your blood sugar? 3-4 times daily  What are your blood sugars ranging?  Fasting: 120-05/28/21 Before meals:  After meals: 103-05/28/21 Bedtime:   During the week, how often does your blood glucose drop below 70? Never  Are you checking your feet daily/regularly?  Patient states he does check his feet daily.   Patient states he has not been receiving his Novolog nor his Levemir since June from [facility] he states they mentioned to him his medication should last 4 months but patient states it does not last that long.   Adherence Review: Is the patient currently on a STATIN medication? Yes Is the patient currently on ACE/ARB medication? Yes Does the patient have >5 day gap between last estimated fill dates? No   Star Rating Drugs: Lisinopril 5 mg Lat filled:05/12/21 90 DS Atorvastatin 40 mg Last filled:05/12/21 90 DS  Myriam Elta [place], [place]

## 2021-05-25 ENCOUNTER — Telehealth: Payer: Self-pay | Admitting: Pharmacist

## 2021-05-25 NOTE — Chronic Care Management (AMB) (Unsigned)
    Chronic Care Management Pharmacy Assistant   Name: Harless L Haskins  MRN: [id number] DOB: 09/05/1945  Reason for Encounter: Disease State   Recent office visits:  None Noted  Recent consult visits:  None Noted  Hospital visits:  None in previous 6 months  Medications: Outpatient Encounter Medications as of 05/25/2021  Medication Sig Note   apixaban (ELIQUIS) 5 MG TABS tablet TAKE 1 TABLET BY MOUTH TWICE DAILY    atorvastatin (LIPITOR) 40 MG tablet TAKE 1 TABLET (40 MG TOTAL) BY MOUTH DAILY.    Calcium Carbonate-Vitamin D 600-400 MG-UNIT tablet Take 1 tablet by mouth 2 (two) times daily. Reported on 01/27/2016    cholecalciferol (VITAMIN D3) 25 MCG (1000 UNIT) tablet Take 1,000 Units by mouth daily.    furosemide (LASIX) 20 MG tablet TAKE 1 TABLET (20 MG TOTAL) BY MOUTH DAILY.    Glucagon, rDNA, (GLUCAGON EMERGENCY) 1 MG KIT Inject 1 mg into the muscle once as needed for up to 1 dose.    insulin aspart (NOVOLOG) 100 UNIT/ML injection INJECT 5-6 UNITS INTO THE SKIN 3 TIMES A DAY WITH MEALS 01/14/2021: Approved to get from [facility] PAP thru 08/08/2021   insulin detemir (LEVEMIR) 100 UNIT/ML injection INJECT 0.12 ML (12 UNITS) INTO THE SKIN AT BEDTIME 03/27/2021: Approved thru [facility] PAP thru 08/08/2021   Insulin Syringe-Needle U-100 (TRUEPLUS INSULIN SYRINGE) 31G X 5/16" 0.5 ML MISC USE TO INJECT INSULIN 4 TIMES A DAY    lisinopril (ZESTRIL) 5 MG tablet TAKE 1 TABLET (5 MG TOTAL) BY MOUTH DAILY.    metoprolol succinate (TOPROL-XL) 25 MG 24 hr tablet TAKE 1 TABLET (25 MG TOTAL) BY MOUTH DAILY.    No facility-administered encounter medications on file as of 05/25/2021.    Have you had any problems recently with your health?   Have you had any problems with your pharmacy?   What issues or side effects are you having with your medications?   What would you like me to pass along to [name] [name],CPP for them to help you with?    What can we do to take care of you better?   Star  Rating Drugs: Atorvastatin(Lipitor) 40 MG last filled 05/12/21 90 DS lisinopril (ZESTRIL) 5 MG tablet last fill 05/12/21 90 DS  Ashley Mays CMA Clinical Pharmacist Assistant [phone]  

## 2021-06-02 NOTE — Progress Notes (Signed)
I have contacted [facility] about the patients medication on Novolog and Levemir. They state that the have not received a refill request from Dr. [name] office since June. They have faxed a refill request to the office & should be received with in 24-48 hours. They state they have the application Novolog taking it 5-6units into the the skin 3x daily and for Levemir 18 units in the evening.

## 2021-06-03 NOTE — Telephone Encounter (Signed)
Recommend follow up in 1 week to see if new prescription has been sent to [facility] and if medication has been shipped.

## 2021-06-16 ENCOUNTER — Other Ambulatory Visit (HOSPITAL_BASED_OUTPATIENT_CLINIC_OR_DEPARTMENT_OTHER): Payer: Self-pay

## 2021-06-22 NOTE — Progress Notes (Signed)
I have contacted [facility] to follow up on Levemir and Novolog both medications have been shipped already through [facility].

## 2021-06-24 ENCOUNTER — Other Ambulatory Visit: Payer: Self-pay

## 2021-06-24 ENCOUNTER — Ambulatory Visit (INDEPENDENT_AMBULATORY_CARE_PROVIDER_SITE_OTHER): Payer: HMO | Admitting: *Deleted

## 2021-06-24 DIAGNOSIS — Z23 Encounter for immunization: Secondary | ICD-10-CM | POA: Diagnosis not present

## 2021-06-24 NOTE — Progress Notes (Signed)
Patient here for high dose flu vaccine.  Vaccine given in left deltoid and patient tolerated well.  

## 2021-06-25 ENCOUNTER — Telehealth: Payer: Self-pay | Admitting: *Deleted

## 2021-06-25 NOTE — Chronic Care Management (AMB) (Signed)
  Care Management   Note  06/25/2021 Name: KASAI BELTRAN MRN: [id number] DOB: Nov 22, 1945  Ryan [name] is a 75 y.o. year old male who is a primary care patient of Sandford Craze, NP and is actively engaged with the care management team. I reached out to Ryan [name] by phone today to assist with scheduling an initial visit with the RN Case Manager  Follow up plan: Telephone appointment with care management team member scheduled for: 07/06/2021  Burman Nieves, CCMA Care Guide, Embedded Care Coordination [facility]  Care Management  Direct Dial: [phone]

## 2021-06-30 ENCOUNTER — Other Ambulatory Visit: Payer: Self-pay

## 2021-06-30 ENCOUNTER — Ambulatory Visit (INDEPENDENT_AMBULATORY_CARE_PROVIDER_SITE_OTHER): Payer: HMO | Admitting: Internal Medicine

## 2021-06-30 ENCOUNTER — Encounter: Payer: Self-pay | Admitting: Internal Medicine

## 2021-06-30 VITALS — BP 120/68 | HR 68 | Ht 69.0 in | Wt 170.0 lb

## 2021-06-30 DIAGNOSIS — E1159 Type 2 diabetes mellitus with other circulatory complications: Secondary | ICD-10-CM

## 2021-06-30 DIAGNOSIS — E1165 Type 2 diabetes mellitus with hyperglycemia: Secondary | ICD-10-CM | POA: Diagnosis not present

## 2021-06-30 DIAGNOSIS — E663 Overweight: Secondary | ICD-10-CM

## 2021-06-30 DIAGNOSIS — E785 Hyperlipidemia, unspecified: Secondary | ICD-10-CM | POA: Diagnosis not present

## 2021-06-30 LAB — POCT GLYCOSYLATED HEMOGLOBIN (HGB A1C): Hemoglobin A1C: 6.9 % — AB (ref 4.0–5.6)
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
## 2021-06-30 NOTE — Addendum Note (Signed)
Addended by: Kenyon Ana on: 06/30/2021 04:30 PM   Modules accepted: Orders

## 2021-06-30 NOTE — Patient Instructions (Addendum)
Please continue: - Levemir 12 units at bedtime  Please decrease: - Novolog mealtime 2-4 units 3x a day before meals   Please return in 4 months with your sugar log.

## 2021-07-06 ENCOUNTER — Ambulatory Visit (INDEPENDENT_AMBULATORY_CARE_PROVIDER_SITE_OTHER): Payer: HMO

## 2021-07-06 DIAGNOSIS — E119 Type 2 diabetes mellitus without complications: Secondary | ICD-10-CM

## 2021-07-06 DIAGNOSIS — I10 Essential (primary) hypertension: Secondary | ICD-10-CM

## 2021-07-06 NOTE — Patient Instructions (Signed)
Visit Information  Thank you for allowing me to share the care management and care coordination services that are available to you as part of your health plan and services through your primary care provider and medical home. Please reach out to me at [phone] if the care management/care coordination team may be of assistance to you in the future.   Armine Rizzolo, RN, MSN, BSN, CCM Care Management Coordinator [facility] [phone]  

## 2021-07-06 NOTE — Chronic Care Management (AMB) (Signed)
  Care Management   Follow Up Note   07/06/2021 Name: MASAHIRO IGLESIA MRN: [id number] DOB: Nov 17, 1945   Referred by: Sandford Craze, NP Reason for referral : Chronic Care Management (RNCM initial assessment)   Successful contact was made with the patient to discuss care management and care coordination services. Patient declines engagement at this time.   RNCM called to discuss care management and care coordination services. Patient lives with family, his son, daughter in law and adult grandchildren. Mr. Wajda reports his blood sugar this morning was 145 and it was 120 last night at bedtime. He checks his blood sugar 4-5 x/day and reports blood sugar primarily ranges 80-180. He reports one blood sugar of 75 in the past month. A1C 6.9 on 06/30/21. Mr. Kostelnik reports blood pressure today 118/67 HR 57 blood pressure ranging 107/75- 132/75. RNCM encouraged Mr. Uzzle to follow up with cardiologist and/or primary care regarding congestive heart failure diagnosis noted on chart. He states he no longer has hip pain. He remains active with walking and house cleaning. He states he has frequent follow ups with his providers. He declines RNCM services at this time. Mr. Anello is aware to contact provider or RNCM if his needs change. RNCM will not open case. He is currently active with Henrene Pastor, Pharm D.  Follow Up Plan: No further follow up required: .  Kathyrn Sheriff, RN, MSN, BSN, CCM Care Management Coordinator [facility] [phone]

## 2021-07-08 ENCOUNTER — Ambulatory Visit: Payer: HMO | Admitting: Pharmacist

## 2021-07-08 DIAGNOSIS — E7800 Pure hypercholesterolemia, unspecified: Secondary | ICD-10-CM

## 2021-07-08 DIAGNOSIS — Z794 Long term (current) use of insulin: Secondary | ICD-10-CM | POA: Diagnosis not present

## 2021-07-08 DIAGNOSIS — Z87891 Personal history of nicotine dependence: Secondary | ICD-10-CM | POA: Diagnosis not present

## 2021-07-08 DIAGNOSIS — I10 Essential (primary) hypertension: Secondary | ICD-10-CM | POA: Diagnosis not present

## 2021-07-08 DIAGNOSIS — E119 Type 2 diabetes mellitus without complications: Secondary | ICD-10-CM | POA: Diagnosis not present

## 2021-07-08 NOTE — Patient Instructions (Signed)
Visit Information  PATIENT GOALS:  Goals Addressed             This Visit's Progress    Chronic Care Management Pharmacy Care Plan   On track    CARE PLAN ENTRY (see longitudinal plan of care for additional care plan information)  Current Barriers:  Chronic Disease Management support, education, and care coordination needs related to Afib/flutter, diabetes, HTN - high blood pressure, hyperlipidemia - high cholesterol, GERD - acid reflux / heartburn, Osteopenia, Gout   Hypertension BP Readings from Last 3 Encounters:  06/30/21 120/68  02/26/21 128/80  01/23/21 138/81  Pharmacist Clinical Goal(s): Over the next 90 days, patient will work with PharmD and providers to maintain BP goal <140/90 Current regimen:  Lisinopril 5mg  daily Metoprolol succinate 25mg  daily Patient self care activities - Over the next 90 days, patient will: Check blood pressure daily, document, and provide at future appointments Ensure daily salt intake < 2300 mg/day  Hyperlipidemia Lab Results  Component Value Date/Time   LDLCALC 73 01/23/2021 10:35 AM  Pharmacist Clinical Goal(s): Over the next 90 days, patient will work with PharmD and providers to achieve LDL goal < 70 Current regimen:  Atorvastatin 40mg  daily Patient self care activities - Over the next 90 days, patient will: Maintain cholesterol medication regimen.  Due to check lipids with next labs  Diabetes Lab Results  Component Value Date/Time   HGBA1C 6.9 (A) 06/30/2021 04:30 PM   HGBA1C 6.5 (A) 02/26/2021 10:35 AM   HGBA1C 7.2 (H) 10/09/2019 01:15 PM   HGBA1C 7.9 (H) 01/27/2016 03:18 PM  Pharmacist Clinical Goal(s): Over the next 90 days, patient will work with PharmD and providers to maintain A1c goal <7% and prevent hypoglycemia Current regimen:  Novolog 4 to 6 units 3 times a day w/ meals Levemir 12 units at bedtime Interventions: Coordinating completion of patient assistance for 02/28/2021 for 2023 - Novolog and Levemir  (completed at previous visit)   Patient self care activities - Over the next 90 days, patient will: Check blood sugar 3-4 times daily, document, and provide at future appointments Contact provider with any episodes of hypoglycemia Return application for patient assistance program after complete patient portion  Atrial Flutter:  Pharmacist Clinical Goal(s) Over the next 90 days, patient will work with PharmD and providers to prevent stroke and control heart rate Current treatment:  Eliquis 5mg  twice a day Metoprolol succinate 25mg  daily Interventions: Coordinated with pharmacy to get current out of pocket spend for 2022. Not at required OOP spend for Eliquis patient assistance application. Has to spend 3% of income in 2022.  Patient self care activities - Over the next 90 days, patient will: Continue current therapy for atrial fibrillation   Medication management Pharmacist Clinical Goal(s): Over the next 90 days, patient will work with PharmD and providers to maintain optimal medication adherence Current pharmacy: 2024 Pharmacy Interventions Comprehensive medication review performed. Continue current medication management strategy Patient self care activities - Over the next 90 days, patient will: Focus on medication adherence by filling and taking medications appropriately  Take medications as prescribed Report any questions or concerns to PharmD and/or provider(s)  Please see past updates related to this goal by clicking on the "Past Updates" button in the selected goal          The patient verbalized understanding of instructions, educational materials, and care plan provided today and declined offer to receive copy of patient instructions, educational materials, and care plan.   Telephone  follow up appointment with care management team member scheduled for: January 2023; Will see PCP December 2022  Henrene Pastor, PharmD Clinical Pharmacist [facility]

## 2021-07-08 NOTE — Chronic Care Management (AMB) (Signed)
Chronic Care Management Pharmacy Note  07/08/2021 Name:  Ryan [name] MRN:  [id number] DOB:  28-Apr-1946  Subjective: Ryan [name] is an 75 y.o. year old male who is a primary patient of [name], NP.  The CCM team was consulted for assistance with disease management and care coordination needs.    Engaged with patient by telephone for follow up visit in response to provider referral for pharmacy case management and/or care coordination services.   Consent to Services:  The patient was given information about Chronic Care Management services, agreed to services, and gave verbal consent prior to initiation of services.  Please see initial visit note for detailed documentation.   Patient Care Team: [name], NP as PCP - General (Internal Medicine) Fay Records, MD as PCP - Cardiology (Cardiology) [name], MD as Consulting Physician (Endocrinology) Fay Records, MD as Consulting Physician (Cardiology) Cherre Robins, [place] (Pharmacist) Luretha Rued, RN as Case Manager  Recent office visits: 02/02/2021 - AWV 01/23/2021 - PCP [name] - f/u chronic conditions. No medication changes noted   Recent consult visits: 06/30/2021 - Endo (Dr Cruzita Lederer) F/U type 2 DM. A1c was 6.9%. No med changes 03/04/2021 - Cardio phone call - cleared by Dr [name] of dental extration. Hold Eliwuis 1 day prior to procedure.  02/26/2021 - Endo (Dr Cruzita Lederer) F/U DM, insuiln dep since 2005. C/O hypoglycemia at night with unawareness. Decreased Levemir to 12 units at bedtime. Lowered Novolog to 4 to6 units prior to meals 10/30/2020 - Endo (Dr Cruzita Lederer) - Reviewed his BG log. BG mostly at goal but they are usually lower after meals compared to before meals.  Decreased NovoLog before each meal to 5 to 6 units.  Also, since he was having low BG during night, advised him to reduce Levemir from 25 to 18 units nightly.    Hospital visits: None in previous 6  months  Objective:  Lab Results  Component Value Date   CREATININE 1.14 01/23/2021   CREATININE 0.99 07/25/2020   CREATININE 1.08 04/25/2020    Lab Results  Component Value Date   HGBA1C 6.9 (A) 06/30/2021   Last diabetic Eye exam:  Lab Results  Component Value Date/Time   HMDIABEYEEXA No Retinopathy 01/24/2020 12:00 AM    Last diabetic Foot exam: No results found for: HMDIABFOOTEX      Component Value Date/Time   CHOL 140 01/23/2021 1035   TRIG 102.0 01/23/2021 1035   HDL 46.70 01/23/2021 1035   CHOLHDL 3 01/23/2021 1035   VLDL 20.4 01/23/2021 1035   LDLCALC 73 01/23/2021 1035    Hepatic Function Latest Ref Rng & Units 01/23/2021 04/06/2019 08/10/2017  Total Protein 6.0 - 8.3 g/dL 7.3 7.0 7.8  Albumin 3.5 - 5.2 g/dL 4.4 3.9 4.1  AST 0 - 37 U/L _0 ALT 0 - 53 U/L _1 Alk Phosphatase 39 - 117 U/L 74 74 69  Total Bilirubin 0.2 - 1.2 mg/dL 1.5(H) 0.8 1.2  Bilirubin, Direct 0.0 - 0.3 mg/dL - - -    Lab Results  Component Value Date/Time   TSH 2.335 01/30/2016 04:08 AM   TSH 4.110 01/27/2016 09:05 AM    CBC Latest Ref Rng & Units 09/08/2020 10/06/2017 08/10/2017  WBC 3.4 - 10.8 x10E3/uL 8.9 7.5 9.8  Hemoglobin 13.0 - 17.7 g/dL 14.6 14.0 15.4  Hematocrit 37.5 - 51.0 % 43.4 41.3 46.7  Platelets 150 - 450 x10E3/uL 255 - 227.0    No results  found for: VD25OH  Clinical ASCVD: Yes  The 10-year ASCVD risk score (Arnett DK, et al., 2019) is: 43.2%   Values used to calculate the score:     Age: 59 years     Sex: Male     Is Non-Hispanic African American: No     Diabetic: Yes     Tobacco smoker: Yes     Systolic Blood Pressure: 532 mmHg     Is BP treated: Yes     HDL Cholesterol: 46.7 mg/dL     Total Cholesterol: 140 mg/dL    Other: CHADS2VASc = 4  Social History   Tobacco Use  Smoking Status Former   Types: Cigarettes   Quit date: 09/01/1965   Years since quitting: 55.8  Smokeless Tobacco Never   BP Readings from Last 3 Encounters:  06/30/21  120/68  02/26/21 128/80  01/23/21 138/81   Pulse Readings from Last 3 Encounters:  06/30/21 68  02/26/21 61  01/23/21 98   Wt Readings from Last 3 Encounters:  06/30/21 170 lb (77.1 kg)  02/26/21 169 lb (76.7 kg)  02/02/21 174 lb (78.9 kg)    Assessment: Review of patient past medical history, allergies, medications, health status, including review of consultants reports, laboratory and other test data, was performed as part of comprehensive evaluation and provision of chronic care management services.   SDOH:  (Social Determinants of Health) assessments and interventions performed:  SDOH Interventions    Flowsheet Row Most Recent Value  SDOH Interventions   Financial Strain Interventions Other (Comment)  [assisting patient with reapplying for insuiln PAP]       CCM Care Plan  Allergies  Allergen Reactions   Other Other (See Comments)    Client states he became very sick and had significant skin inflammation and irritation with the Freestyle Libre sensor    Medications Reviewed Today     Reviewed by Cherre Robins, RPH-CPP (Pharmacist) on 07/08/21 at 1715  Med List Status: <None>   Medication Order Taking? Sig Documenting Provider Last Dose Status Informant  apixaban (ELIQUIS) 5 MG TABS tablet [id number] Yes TAKE 1 TABLET BY MOUTH TWICE DAILY Fay Records, MD Taking Active   atorvastatin (LIPITOR) 40 MG tablet [id number] Yes TAKE 1 TABLET (40 MG TOTAL) BY MOUTH DAILY. [name], NP Taking Active   Calcium Carbonate-Vitamin D 600-400 MG-UNIT tablet [id number] Yes Take 1 tablet by mouth 2 (two) times daily. Reported on 01/27/2016 [provider] Taking Active Self  cholecalciferol (VITAMIN D3) 25 MCG (1000 UNIT) tablet [id number] Yes Take 1,000 Units by mouth daily. [provider] Taking Active Self  furosemide (LASIX) 20 MG tablet [id number] Yes TAKE 1 TABLET (20 MG TOTAL) BY MOUTH DAILY. [name], NP Taking Active   Glucagon, rDNA,  (GLUCAGON EMERGENCY) 1 MG KIT [id number] Yes Inject 1 mg into the muscle once as needed for up to 1 dose. [name], MD Taking Active   insulin aspart (NOVOLOG) 100 UNIT/ML injection [id number] Yes INJECT 5-6 UNITS INTO THE SKIN 3 TIMES A DAY WITH MEALS [name], MD Taking Active            Med Note [place], [place] B   Wed Jan 14, 2021 10:00 AM) Approved to get from Novo PAP thru 08/08/2021  insulin detemir (LEVEMIR) 100 UNIT/ML injection [id number] Yes INJECT 0.12 ML (12 UNITS) INTO THE SKIN AT BEDTIME [name], MD Taking Active            Med Note [place], Tylon [place] B  Fri Mar 27, 2021 11:23 AM) Approved thru [place] PAP thru 08/08/2021  Insulin Syringe-Needle U-100 (TRUEPLUS INSULIN SYRINGE) 31G X 5/16" 0.5 ML MISC [id number] Yes USE TO INJECT INSULIN 4 TIMES A [name], MD Taking Active   lisinopril (ZESTRIL) 5 MG tablet [id number] Yes TAKE 1 TABLET (5 MG TOTAL) BY MOUTH DAILY. [name], NP Taking Active   metoprolol succinate (TOPROL-XL) 25 MG 24 hr tablet [id number] Yes TAKE 1 TABLET (25 MG TOTAL) BY MOUTH DAILY. [name], NP Taking Active             Patient Active Problem List   Diagnosis Date Noted   Right hip pain 01/23/2021   Chronic diastolic CHF (congestive heart failure) ([place]) 07/27/2020   Moderate mitral regurgitation 07/27/2020   Left foot pain 04/12/2019   Overweight (BMI 25.0-29.9) 01/12/2019   Atrial flutter ([place]) 01/27/2016   Elevated troponin 01/27/2016   Poorly controlled type 2 diabetes mellitus with circulatory disorder ([place]) 09/08/2015   Preventative health care 02/05/2015   Benign paroxysmal positional vertigo 01/15/2014   Skin lesion of left arm 05/11/2013   Osteopenia 06/09/2011   Hyperbilirubinemia 05/26/2011   HTN (hypertension) 02/22/2011   Hyperlipidemia 02/22/2011   GERD (gastroesophageal reflux disease) 02/22/2011   Allergic rhinitis 02/22/2011    Immunization History  Administered Date(s)  Administered   Fluad Quad(high Dose 65+) 05/15/2019, 04/25/2020, 06/24/2021   H1N1 11/19/2008   Influenza Split 05/25/2011, 04/20/2012   Influenza Whole 06/06/2009   Influenza, High Dose Seasonal PF 04/20/2016, 05/12/2017, 05/26/2018   Influenza,inj,Quad PF,6+ Mos 05/07/2013, 05/27/2014, 05/13/2015   Influenza-Unspecified 05/09/2004, 06/09/2005, 06/15/2006, 07/11/2007, 05/15/2008, 06/06/2009   PFIZER(Purple Top)SARS-COV-2 Vaccination 09/30/2019, 10/24/2019, 07/22/2020   Pneumococcal Conjugate-13 10/19/2004, 05/11/2013   Pneumococcal Polysaccharide-23 05/25/2011, 09/08/2018   Td 08/09/2001, 08/25/2011   Zoster Recombinat (Shingrix) 01/22/2021, 05/20/2021   Zoster, Live 08/25/2011    Conditions to be addressed/monitored: CAD, HTN, HLD, DMII, and atrial flutter  Care Plan : General Pharmacy (Adult)  Updates made by Cherre Robins, RPH-CPP since 07/08/2021 12:00 AM     Problem: HTN, HLD, Afib, DM   Priority: High  Onset Date: 09/25/2020     Long-Range Goal: Provide medication management and chronic conditions management for: DM, HTN, atrial fibrillation and other chronic conditions   Start Date: 09/25/2020  Expected End Date: 03/25/2021  Recent Progress: On track  Priority: High  Note:   Current Barriers:  Unable to independently afford treatment regimen - assistance in progress for Eliquis, Novolog and Levemir Provide medication and disease state management for diabetes; hypertension, hyperlipidemia and atrial fibrillation  Pharmacist Clinical Goal(s):  Over the next 180 days, patient will verbalize ability to afford treatment regimen maintain control of blood sugar and blood pressure as evidenced by home monitoring  contact provider office for questions/concerns as evidenced by notation in electronic health record through collaboration with PharmD and provider.   Interventions: 1:1 collaboration with [name], NP regarding development and update of comprehensive plan  of care as evidenced by provider attestation and co-signature Inter-disciplinary care team collaboration (see longitudinal plan of care) Comprehensive medication review performed; medication list updated in electronic medical record  Hypertension (BP goal <130/80) Controlled Current treatment: Lisinopril 19m daily Metoprolol succinate 285mdaily Medications previously tried: amlodipine (unsure why d/c)  Current home readings per patient: 122 to 136 / 70's Denies hypotensive/hypertensive symptoms - no chest pain or dizziness Interventions;  Reviewed BP goals and benefits of medications for prevention of heart attack, stroke and kidney damage; Continue to  exercise. Work up as able to goal of 150 minutes per week. Counseled to monitor BP at home periodically, document, and provide log at future appointments Recommended to continue current medication  Hyperlipidemia: (LDL goal < 70) Last LDL slightly above goal at 73 (01/23/2021) Current treatment: Atorvastatin 63m daily Medications previously tried: pravastatin Current exercise habits: walking or weight training about 10 minutes 4 days per week Interventions:  Reviewed cholesterol goals - if LDL continues to be >70 could consider increasing atorvastatin to 857mdaily or change to rosuvastatin 402maily Discussed benefits of statin for ASCVD risk reduction; Recommended to continue current medication  Diabetes (A1c goal <7%) Controlled; Last A1c 6.9% Recent home BG reading ranged from 70 to 150 Reports he usually uses juice to treat hypoglycemia episodes but has glucagon on hand  Current medications: Novolog 4 to 6 units three times a day w/ meals Levemir 12 units at bedtime  Medications previously tried: metformin (GI) Assisted with application for insulin through [employer] has current approved thru 07/08/2021. Patient reports he has not gotten shipments since June 2022. He did purchase some Novolog and Levemir  recently Interventions:  Reviewed recognition and treatment of hypoglycemia.  Reviewed home blood glucose readings and reviewed goals  Fasting blood glucose goal (before meals) = 80 to 130 Blood glucose goal after a meal = less than 180  Reminded to check feet daily and get yearly eye exams [employer] assistance program and requested refill of both Novolog and Levemir. Reminded patient it will be shipped to endo office.  Also started application for [id number] patient assistance. Mailed to patient to complete his portion. Will take to endo office for Dr GheCruzita Lederer complete (or he can bring to PCP office and I can fax to endo)   Atrial Flutter (Goal: prevent stroke and major bleeding) Controlled  CHADSVASC:  4 Current treatment: Rate control: Metoprolol succinate 2m60mily Anticoagulation: Eliquis 5mg 50mce daily Medications previously tried: none noted Denies s/s of bleeding or bruising; Denies palpitations Interventions:  Counseled on bleeding risk associated with Eliquis and importance of self-monitoring for signs/symptoms of bleeding; Patient has completed application for Eliquis patient assistance and returned to office. Unfortunately he has only spent about $500 OOP so far this year. Program requires that he spend 3% of income OOP (about $1000) in this [place] / 2022. Patient reports that cost of Eliquis has been $45 and this is affordable as all his other medications are $0 Recommended continue current medications  Medication Management: Will continue to review adherence and follow up with patient as needed Continue to take medications as prescribed.   Patient Goals/Self-Care Activities Over the next 180 days, patient will:  take medications as prescribed check glucose daily, document, and provide at future appointments collaborate with provider and clinical pharmacist on medication access solutions  Follow Up Plan: The care management team will reach out  to the patient again in January 2023. Will see PCP in December 2022.     Medication Assistance:  Levemir and Novolog obtained through [employer] assistance program.  Enrollment ends 07/08/2021  Ordered Levemir and Novolog.  Application for 2023 4982ed to patient Patient has completed PAP for Eliquis and returned to office but not sure the he will meet  3% OOP spend brequirement in 2022. Current dose of Eliquis is $45/month. Not in coverage gap.   Patient's preferred pharmacy is:  [facility] [address]  [place] [address] Phone: [phone] Fax: [phone]  [place] [address] Phone: [phone] Fax: [phone]  [facility] (New Address) - [place], [place] - [address] AT Previously: [address] [place] [address] Phone: [phone] Fax: [phone]   Follow Up:  Patient agrees to Care Plan and Follow-up.  Plan: The care management team will reach out to the patient again over the next 60 days.  Cherre Robins, PharmD Clinical Pharmacist [facility]

## 2021-07-18 ENCOUNTER — Other Ambulatory Visit (HOSPITAL_BASED_OUTPATIENT_CLINIC_OR_DEPARTMENT_OTHER): Payer: Self-pay

## 2021-07-20 ENCOUNTER — Other Ambulatory Visit (HOSPITAL_BASED_OUTPATIENT_CLINIC_OR_DEPARTMENT_OTHER): Payer: Self-pay

## 2021-07-27 ENCOUNTER — Ambulatory Visit (INDEPENDENT_AMBULATORY_CARE_PROVIDER_SITE_OTHER): Payer: HMO | Admitting: Family

## 2021-07-27 DIAGNOSIS — I10 Essential (primary) hypertension: Secondary | ICD-10-CM

## 2021-07-27 DIAGNOSIS — K219 Gastro-esophageal reflux disease without esophagitis: Secondary | ICD-10-CM

## 2021-07-27 DIAGNOSIS — E7800 Pure hypercholesterolemia, unspecified: Secondary | ICD-10-CM

## 2021-07-27 DIAGNOSIS — I5032 Chronic diastolic (congestive) heart failure: Secondary | ICD-10-CM

## 2021-07-27 DIAGNOSIS — E1159 Type 2 diabetes mellitus with other circulatory complications: Secondary | ICD-10-CM | POA: Diagnosis not present

## 2021-07-27 DIAGNOSIS — E1165 Type 2 diabetes mellitus with hyperglycemia: Secondary | ICD-10-CM

## 2021-07-27 LAB — COMPREHENSIVE METABOLIC PANEL
ALT: 16 U/L (ref 0–53)
AST: 17 U/L (ref 0–37)
Albumin: 3.9 g/dL (ref 3.5–5.2)
Alkaline Phosphatase: 68 U/L (ref 39–117)
BUN: 14 mg/dL (ref 6–23)
CO2: 29 mEq/L (ref 19–32)
Calcium: 9.6 mg/dL (ref 8.4–10.5)
Chloride: 103 mEq/L (ref 96–112)
Creatinine, Ser: 0.96 mg/dL (ref 0.40–1.50)
GFR: 77.48 mL/min (ref >60.00)
Glucose, Bld: 224 mg/dL — ABNORMAL HIGH (ref 70–99)
Potassium: 4.8 mEq/L (ref 3.5–5.1)
Sodium: 137 mEq/L (ref 135–145)
Total Bilirubin: 1 mg/dL (ref 0.2–1.2)
Total Protein: 6.7 g/dL (ref 6.0–8.3)

## 2021-07-27 NOTE — Assessment & Plan Note (Signed)
Lab Results  Component Value Date   HGBA1C 6.9 (A) 06/30/2021   HGBA1C 6.5 (A) 02/26/2021   HGBA1C 6.4 (A) 10/30/2020   Lab Results  Component Value Date   MICROALBUR 0.9 10/09/2019   LDLCALC 73 01/23/2021   CREATININE 1.14 01/23/2021   At goal- management per endocrinology.

## 2021-07-27 NOTE — Assessment & Plan Note (Signed)
Reports symptoms stable

## 2021-07-27 NOTE — Assessment & Plan Note (Signed)
Wt Readings from Last 3 Encounters:  07/27/21 173 lb (78.5 kg)  06/30/21 170 lb (77.1 kg)  02/26/21 169 lb (76.7 kg)

## 2021-07-27 NOTE — Patient Instructions (Addendum)
Please complete lab work prior to leaving.  Please get the bivalent covid booster.

## 2021-07-27 NOTE — Progress Notes (Signed)
Subjective:   By signing my name below, I, Ryan [name], attest that this documentation has been prepared under the direction and in the presence of Ryan Alar NP. 07/27/2021    Patient ID: Ryan [name], male    DOB: 1945/12/03, 75 y.o.   MRN: [id number]  Chief Complaint  Patient presents with   Hypertension    Here for follow up    Hypertension  Patient is in today for a office visit.   Blood pressure- He continues measuring his blood pressure at home regularly. He reports his recent measurements are elevated. He continues taking 5 mg lisinopril daily PO, 25 mg metoprolol succinate daily PO, 20 mg lasix daily PO and reports no new issues while taking it. He denies having any recent reflux symptoms.   BP Readings from Last 3 Encounters:  07/27/21 (!) 156/84  06/30/21 120/68  02/26/21 128/80   Pulse Readings from Last 3 Encounters:  07/27/21 63  06/30/21 68  02/26/21 61   Vertigo- He denies having any recent vertigo symptoms.  Weight- His weight is stable. He wants to lose weight but is struggling to lose weight.   Wt Readings from Last 3 Encounters:  07/27/21 173 lb (78.5 kg)  06/30/21 170 lb (77.1 kg)  02/26/21 169 lb (76.7 kg)   Immunizations- He was informed of the bivalent Covid-19 vaccine.    Health Maintenance Due  Topic Date Due   COVID-19 Vaccine (4 - Booster for [employer] series) 09/16/2020   OPHTHALMOLOGY EXAM  01/23/2021    Past Medical History:  Diagnosis Date   Allergy    Diabetes mellitus    GERD (gastroesophageal reflux disease)    History of chicken pox    Hyperlipidemia    Hypertension    Myocardial infarction ([place]) 2015-10-18   Type 2 diabetes mellitus with hyperglycemia, with long-term current use of insulin ([place]) 09/08/2015    Past Surgical History:  Procedure Laterality Date   EYE SURGERY  1998   bilateral   SHOULDER SURGERY  1990   right shoulder, torn rotator cuff.    Family History  Problem Relation Age of Onset   Cancer  Mother        cancer   Heart disease Mother    Hyperlipidemia Mother    Hypertension Mother    Hyperlipidemia Father    Stroke Father    Hypertension Father    Diabetes Paternal Grandfather     Social History   Socioeconomic History   Marital status: Widowed    Spouse name: Not on file   Number of children: 1   Years of education: Not on file   Highest education level: Not on file  Occupational History   Occupation: retired    Comment: from retail  Tobacco Use   Smoking status: Former    Types: Cigarettes    Quit date: 09/01/1965    Years since quitting: 55.9   Smokeless tobacco: Never  Vaping Use   Vaping Use: Never used  Substance and Sexual Activity   Alcohol use: No   Drug use: No   Sexual activity: Never  Other Topics Concern   Not on file  Social History Narrative   Regular exercise:  5 x weekly   Caffeine Use: 1 cups coffee daily.   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   Son, daughter in law and young adult granddaughter and grandson live with client   Wife died from ovarian cancer in October 17, 2014  Social Determinants of Health   Financial Resource Strain: Medium Risk   Difficulty of Paying Living Expenses: Somewhat hard  Food Insecurity: No Food Insecurity   Worried About Charity fundraiser in the Last Year: Never true   Ran Out of Food in the Last Year: Never true  Transportation Needs: No Transportation Needs   Lack of Transportation (Medical): No   Lack of Transportation (Non-Medical): No  Physical Activity: Insufficiently Active   Days of Exercise per Week: 4 days   Minutes of Exercise per Session: 10 min  Stress: No Stress Concern Present   Feeling of Stress : Not at all  Social Connections: Socially Isolated   Frequency of Communication with Friends and Family: More than three times a week   Frequency of Social Gatherings with Friends and Family: Once a week   Attends Religious Services: Never   Marine scientist or Organizations: No    Attends Archivist Meetings: Never   Marital Status: Widowed  Human resources officer Violence: Not At Risk   Fear of Current or Ex-Partner: No   Emotionally Abused: No   Physically Abused: No   Sexually Abused: No    Outpatient Medications Prior to Visit  Medication Sig Dispense Refill   apixaban (ELIQUIS) 5 MG TABS tablet TAKE 1 TABLET BY MOUTH TWICE DAILY 60 tablet 5   atorvastatin (LIPITOR) 40 MG tablet TAKE 1 TABLET (40 MG TOTAL) BY MOUTH DAILY. 90 tablet 1   Calcium Carbonate-Vitamin D 600-400 MG-UNIT tablet Take 1 tablet by mouth 2 (two) times daily. Reported on 01/27/2016     cholecalciferol (VITAMIN D3) 25 MCG (1000 UNIT) tablet Take 1,000 Units by mouth daily.     furosemide (LASIX) 20 MG tablet TAKE 1 TABLET (20 MG TOTAL) BY MOUTH DAILY. 90 tablet 1   Glucagon, rDNA, (GLUCAGON EMERGENCY) 1 MG KIT Inject 1 mg into the muscle once as needed for up to 1 dose. 1 kit 12   insulin aspart (NOVOLOG) 100 UNIT/ML injection INJECT 5-6 UNITS INTO THE SKIN 3 TIMES A DAY WITH MEALS 30 mL 5   insulin detemir (LEVEMIR) 100 UNIT/ML injection INJECT 0.12 ML (12 UNITS) INTO THE SKIN AT BEDTIME 30 mL 5   Insulin Syringe-Needle U-100 (TRUEPLUS INSULIN SYRINGE) 31G X 5/16" 0.5 ML MISC USE TO INJECT INSULIN 4 TIMES A DAY 300 each 3   lisinopril (ZESTRIL) 5 MG tablet TAKE 1 TABLET (5 MG TOTAL) BY MOUTH DAILY. 90 tablet 0   metoprolol succinate (TOPROL-XL) 25 MG 24 hr tablet TAKE 1 TABLET (25 MG TOTAL) BY MOUTH DAILY. 90 tablet 1   No facility-administered medications prior to visit.    Allergies  Allergen Reactions   Other Other (See Comments)    Client states he became very sick and had significant skin inflammation and irritation with the Freestyle Libre sensor    Review of Systems  Gastrointestinal:        (-)Reflux  Neurological:  Negative for dizziness (vertigo).      Objective:    Physical Exam Constitutional:      General: He is not in acute distress.    Appearance: Normal  appearance. He is not ill-appearing.  HENT:     Head: Normocephalic and atraumatic.     Right Ear: External ear normal.     Left Ear: External ear normal.  Eyes:     Extraocular Movements: Extraocular movements intact.     Pupils: Pupils are equal, round, and reactive to light.  Cardiovascular:     Rate and Rhythm: Normal rate and regular rhythm.     Heart sounds: Normal heart sounds. No murmur heard.   No gallop.     Comments: Blood pressure measured 155/81 during recheck Pulmonary:     Effort: Pulmonary effort is normal. No respiratory distress.     Breath sounds: Normal breath sounds. No wheezing or rales.  Skin:    General: Skin is warm and dry.  Neurological:     Mental Status: He is alert and oriented to person, place, and time.  Psychiatric:        Behavior: Behavior normal.    BP (!) 156/84 (BP Location: Right Arm, Patient Position: Sitting, Cuff Size: Small)    Pulse 63    Temp 98.1 F (36.7 C) (Oral)    Resp 16    Ht _0  (1.753 m)    Wt 173 lb (78.5 kg)    SpO2 100%    BMI 25.55 kg/m  Wt Readings from Last 3 Encounters:  07/27/21 173 lb (78.5 kg)  06/30/21 170 lb (77.1 kg)  02/26/21 169 lb (76.7 kg)       Assessment & Plan:   Problem List Items Addressed This Visit       Unprioritized   Poorly controlled type 2 diabetes mellitus with circulatory disorder ([place])    Lab Results  Component Value Date   HGBA1C 6.9 (A) 06/30/2021   HGBA1C 6.5 (A) 02/26/2021   HGBA1C 6.4 (A) 10/30/2020   Lab Results  Component Value Date   MICROALBUR 0.9 10/09/2019   [place] 73 01/23/2021   CREATININE 1.14 01/23/2021  At goal- management per endocrinology.       Hyperlipidemia    Lab Results  Component Value Date   CHOL 140 01/23/2021   HDL 46.70 01/23/2021   LDLCALC 73 01/23/2021   TRIG 102.0 01/23/2021   CHOLHDL 3 01/23/2021  Continue atorvastatin.       HTN (hypertension)    BP Readings from Last 3 Encounters:  07/27/21 (!) 156/84  06/30/21 120/68   02/26/21 128/80  BP is mildly elevated today in the office, however he brings with him today his home bp readings which have been excellent. Will continue current dose of metoprolol and lisinopril. Pt is advised to call if his home readings begin running higher.       Relevant Orders   Comp Met (CMET)   GERD (gastroesophageal reflux disease)    Reports symptoms stable.       Chronic diastolic CHF (congestive heart failure) (H[facility])    Wt Readings from Last 3 Encounters:  07/27/21 173 lb (78.5 kg)  06/30/21 170 lb (77.1 kg)  02/26/21 169 lb (76.7 kg)        No orders of the defined types were placed in this encounter.   I, Nance Pear, NP, personally preformed the services described in this documentation.  All medical record entries made by the scribe were at my direction and in my presence.  I have reviewed the chart and discharge instructions (if applicable) and agree that the record reflects my personal performance and is accurate and complete. _1 @     Nance Pear, NP

## 2021-07-27 NOTE — Assessment & Plan Note (Signed)
Lab Results  Component Value Date   CHOL 140 01/23/2021   HDL 46.70 01/23/2021   LDLCALC 73 01/23/2021   TRIG 102.0 01/23/2021   CHOLHDL 3 01/23/2021   Continue atorvastatin.

## 2021-07-27 NOTE — Assessment & Plan Note (Addendum)
BP Readings from Last 3 Encounters:  07/27/21 (!) 156/84  06/30/21 120/68  02/26/21 128/80   BP is mildly elevated today in the office, however he brings with him today his home bp readings which have been excellent. Will continue current dose of metoprolol and lisinopril. Pt is advised to call if his home readings begin running higher.

## 2021-07-31 ENCOUNTER — Ambulatory Visit (INDEPENDENT_AMBULATORY_CARE_PROVIDER_SITE_OTHER): Payer: HMO | Admitting: Pharmacist

## 2021-07-31 DIAGNOSIS — Z794 Long term (current) use of insulin: Secondary | ICD-10-CM

## 2021-07-31 DIAGNOSIS — E119 Type 2 diabetes mellitus without complications: Secondary | ICD-10-CM

## 2021-07-31 DIAGNOSIS — E7800 Pure hypercholesterolemia, unspecified: Secondary | ICD-10-CM

## 2021-08-04 NOTE — Patient Instructions (Signed)
Ryan [name] Our next appointment is by telephone on August 17, 2021 at 11:15am  Please call the care guide team at [phone] if you need to cancel or reschedule your appointment.   If you have any questions or concerns, please feel free to contact me either at the phone number below or with a MyChart message.   Keep up the good work!  Henrene Pastor, PharmD Clinical Pharmacist [facility] [phone] (direct line)  [phone] (main office number)   Visit Information  PATIENT GOALS:  Goals Addressed             This Visit's Progress    Chronic Care Management Pharmacy Care Plan   On track    CARE PLAN ENTRY (see longitudinal plan of care for additional care plan information)  Current Barriers:  Chronic Disease Management support, education, and care coordination needs related to Afib/flutter, diabetes, HTN - high blood pressure, hyperlipidemia - high cholesterol, GERD - acid reflux / heartburn, Osteopenia, Gout   Hypertension BP Readings from Last 3 Encounters:  07/27/21 (!) 156/84  06/30/21 120/68  02/26/21 128/80  Pharmacist Clinical Goal(s): Over the next 90 days, patient will work with PharmD and providers to achieve BP goal <140/90 Current regimen:  Lisinopril 5mg  daily Metoprolol succinate 25mg  daily Patient self care activities - Over the next 90 days, patient will: Check blood pressure daily, document, and provide at future appointments Ensure daily salt intake < 2300 mg/day  Hyperlipidemia Lab Results  Component Value Date/Time   LDLCALC 73 01/23/2021 10:35 AM  Pharmacist Clinical Goal(s): Over the next 90 days, patient will work with PharmD and providers to achieve LDL goal < 70 Current regimen:  Atorvastatin 40mg  daily Patient self care activities - Over the next 90 days, patient will: Maintain cholesterol medication regimen.  Due to check lipids with next labs  Diabetes Lab Results  Component Value Date/Time    HGBA1C 6.9 (A) 06/30/2021 04:30 PM   HGBA1C 6.5 (A) 02/26/2021 10:35 AM   HGBA1C 7.2 (H) 10/09/2019 01:15 PM   HGBA1C 7.9 (H) 01/27/2016 03:18 PM  Pharmacist Clinical Goal(s): Over the next 90 days, patient will work with PharmD and providers to maintain A1c goal <7% and prevent hypoglycemia Current regimen:  Novolog 4 to 6 units 3 times a day w/ meals Levemir 12 units at bedtime Interventions: Coordinating completion of patient assistance for 02/28/2021 for 2023 - Novolog and Levemir. Forwarded application to endocrinology office for review and signature.   Patient self care activities - Over the next 90 days, patient will: Check blood sugar 3-4 times daily, document, and provide at future appointments Contact provider with any episodes of hypoglycemia  Atrial Flutter:  Pharmacist Clinical Goal(s) Over the next 90 days, patient will work with PharmD and providers to prevent stroke and control heart rate Current treatment:  Eliquis 5mg  twice a day Metoprolol succinate 25mg  daily Interventions: Coordinated with pharmacy to get current out of pocket spend for 2022. Not at required OOP spend for Eliquis patient assistance application. Has to spend 3% of income in 2022. Will continue to follow for 2023.  Patient self care activities - Over the next 90 days, patient will: Continue current therapy for atrial fibrillation   Medication management Pharmacist Clinical Goal(s): Over the next 90 days, patient will work with PharmD and providers to maintain optimal medication adherence Current pharmacy: Pharmacy Interventions Comprehensive medication review performed. Continue current medication management strategy Patient self care activities -  Over the next 90 days, patient will: Focus on medication adherence by filling and taking medications appropriately  Take medications as prescribed Report any questions or concerns to PharmD and/or provider(s)  Please see  past updates related to this goal by clicking on the "Past Updates" button in the selected goal          The patient verbalized understanding of instructions, educational materials, and care plan provided today and declined offer to receive copy of patient instructions, educational materials, and care plan.   Telephone follow up appointment with care management team member scheduled for: January 2023 - to follow up patient assistance program application for Levemir and Novolog  Henrene Pastor, PharmD Clinical Pharmacist [place] [facility]

## 2021-08-04 NOTE — Chronic Care Management (AMB) (Signed)
Chronic Care Management Pharmacy Note  08/04/2021 Name:  Ryan [name] MRN:  [id number] DOB:  02-07-1946  Subjective: Ryan [name] is an 75 y.o. year old male who is a primary patient of [name], NP.  The CCM team was consulted for assistance with disease management and care coordination needs.    Collaboration with patient assistance program for [employer] and Endocrinology office  for  submission of [id number] application for patient assistance program (Levemir and Novolog)   in response to provider referral for pharmacy case management and/or care coordination services.   Consent to Services:  The patient was given information about Chronic Care Management services, agreed to services, and gave verbal consent prior to initiation of services.  Please see initial visit note for detailed documentation.   Patient Care Team: [name], NP as PCP - General (Internal Medicine) Fay Records, MD as PCP - Cardiology (Cardiology) [name], MD as Consulting Physician (Endocrinology) Fay Records, MD as Consulting Physician (Cardiology) Cherre Robins, [place] (Pharmacist) Luretha Rued, RN as Case Manager  Recent office visits: 07/27/2021 - PCP [name]) F/U chronic conditions. Office BP elevated but home BPs were in range. No medication changes.  02/02/2021 - AWV 01/23/2021 - PCP [name] - f/u chronic conditions. No medication changes noted   Recent consult visits: 06/30/2021 - Endo (Dr Cruzita Lederer) F/U type 2 DM. A1c was 6.9%. Lowered mealtime Novolog to 2 to 4 units.  03/04/2021 - Cardio phone call - cleared by Dr [name] of dental extration. Hold Eliquis 1 day prior to procedure.  02/26/2021 - Endo (Dr Cruzita Lederer) F/U DM, insuiln dep since 2005. C/O hypoglycemia at night with unawareness. Decreased Levemir to 12 units at bedtime. Lowered Novolog to 4 to 6 units prior to meals 10/30/2020 - Endo (Dr Cruzita Lederer) - Reviewed his BG log. BG mostly at goal but they are  usually lower after meals compared to before meals.  Decreased NovoLog before each meal to 5 to 6 units.  Also, since he was having low BG during night, advised him to reduce Levemir from 25 to 18 units nightly.    Hospital visits: None in previous 6 months  Objective:  Lab Results  Component Value Date   CREATININE 0.96 07/27/2021   CREATININE 1.14 01/23/2021   CREATININE 0.99 07/25/2020    Lab Results  Component Value Date   HGBA1C 6.9 (A) 06/30/2021   Last diabetic Eye exam:  Lab Results  Component Value Date/Time   HMDIABEYEEXA No Retinopathy 01/24/2020 12:00 AM    Last diabetic Foot exam: No results found for: HMDIABFOOTEX      Component Value Date/Time   CHOL 140 01/23/2021 1035   TRIG 102.0 01/23/2021 1035   HDL 46.70 01/23/2021 1035   CHOLHDL 3 01/23/2021 1035   VLDL 20.4 01/23/2021 1035   LDLCALC 73 01/23/2021 1035    Hepatic Function Latest Ref Rng & Units 07/27/2021 01/23/2021 04/06/2019  Total Protein 6.0 - 8.3 g/dL 6.7 7.3 7.0  Albumin 3.5 - 5.2 g/dL 3.9 4.4 3.9  AST 0 - 37 U/L _0 ALT 0 - 53 U/L _1 Alk Phosphatase 39 - 117 U/L 68 74 74  Total Bilirubin 0.2 - 1.2 mg/dL 1.0 1.5(H) 0.8  Bilirubin, Direct 0.0 - 0.3 mg/dL - - -    Lab Results  Component Value Date/Time   TSH 2.335 01/30/2016 04:08 AM   TSH 4.110 01/27/2016 09:05 AM    CBC Latest Ref Rng &  Units 09/08/2020 10/06/2017 08/10/2017  WBC 3.4 - 10.8 x10E3/uL 8.9 7.5 9.8  Hemoglobin 13.0 - 17.7 g/dL 14.6 14.0 15.4  Hematocrit 37.5 - 51.0 % 43.4 41.3 46.7  Platelets 150 - 450 x10E3/uL 255 - 227.0    No results found for: VD25OH  Clinical ASCVD: Yes  The 10-year ASCVD risk score (Arnett DK, et al., 2019) is: 59.6%   Values used to calculate the score:     Age: 5 years     Sex: Male     Is Non-Hispanic African American: No     Diabetic: Yes     Tobacco smoker: Yes     Systolic Blood Pressure: 771 mmHg     Is BP treated: Yes     HDL Cholesterol: 46.7 mg/dL     Total  Cholesterol: 140 mg/dL    Other: CHADS2VASc = 4  Social History   Tobacco Use  Smoking Status Former   Types: Cigarettes   Quit date: 09/01/1965   Years since quitting: 55.9  Smokeless Tobacco Never   BP Readings from Last 3 Encounters:  07/27/21 (!) 156/84  06/30/21 120/68  02/26/21 128/80   Pulse Readings from Last 3 Encounters:  07/27/21 63  06/30/21 68  02/26/21 61   Wt Readings from Last 3 Encounters:  07/27/21 173 lb (78.5 kg)  06/30/21 170 lb (77.1 kg)  02/26/21 169 lb (76.7 kg)    Assessment: Review of patient past medical history, allergies, medications, health status, including review of consultants reports, laboratory and other test data, was performed as part of comprehensive evaluation and provision of chronic care management services.   SDOH:  (Social Determinants of Health) assessments and interventions performed:     CCM Care Plan  Allergies  Allergen Reactions   Other Other (See Comments)    Client states he became very sick and had significant skin inflammation and irritation with the Freestyle Libre sensor    Medications Reviewed Today     Reviewed by Cherre Robins, RPH-CPP (Pharmacist) on 08/04/21 at [phone]  Med List Status: <None>   Medication Order Taking? Sig Documenting Provider Last Dose Status Informant  apixaban (ELIQUIS) 5 MG TABS tablet [id number] No TAKE 1 TABLET BY MOUTH TWICE DAILY Fay Records, MD Taking Active   atorvastatin (LIPITOR) 40 MG tablet [id number] No TAKE 1 TABLET (40 MG TOTAL) BY MOUTH DAILY. [name], NP Taking Active   Calcium Carbonate-Vitamin D 600-400 MG-UNIT tablet [id number] No Take 1 tablet by mouth 2 (two) times daily. Reported on 01/27/2016 [provider] Taking Active Self  cholecalciferol (VITAMIN D3) 25 MCG (1000 UNIT) tablet [id number] No Take 1,000 Units by mouth daily. [provider] Taking Active Self  furosemide (LASIX) 20 MG tablet [id number] No TAKE 1 TABLET (20 MG TOTAL) BY  MOUTH DAILY. [name], NP Taking Active   Glucagon, rDNA, (GLUCAGON EMERGENCY) 1 MG KIT [id number] No Inject 1 mg into the muscle once as needed for up to 1 dose. [name], MD Taking Active   insulin aspart (NOVOLOG) 100 UNIT/ML injection [id number] No INJECT 5-6 UNITS INTO THE SKIN 3 TIMES A DAY WITH MEALS [name], MD Taking Active            Med Note [place], [place] B   Wed Jan 14, 2021 10:00 AM) Approved to get from Novo PAP thru 08/08/2021  insulin detemir (LEVEMIR) 100 UNIT/ML injection [id number] No INJECT 0.12 ML (12 UNITS) INTO THE SKIN AT BEDTIME [name], MD Taking Active  Med Note Antony Contras, Sandre Kitty   Fri Mar 27, 2021 11:23 AM) Approved thru [place] PAP thru 08/08/2021  Insulin Syringe-Needle U-100 (TRUEPLUS INSULIN SYRINGE) 31G X 5/16" 0.5 ML MISC [id number] No USE TO INJECT INSULIN 4 TIMES A [name], MD Taking Active   lisinopril (ZESTRIL) 5 MG tablet [id number] No TAKE 1 TABLET (5 MG TOTAL) BY MOUTH DAILY. [name], NP Taking Active   metoprolol succinate (TOPROL-XL) 25 MG 24 hr tablet [id number] No TAKE 1 TABLET (25 MG TOTAL) BY MOUTH DAILY. [name], NP Taking Active             Patient Active Problem List   Diagnosis Date Noted   Right hip pain 01/23/2021   Chronic diastolic CHF (congestive heart failure) ([place]) 07/27/2020   Moderate mitral regurgitation 07/27/2020   Overweight (BMI 25.0-29.9) 01/12/2019   Atrial flutter ([place]) 01/27/2016   Elevated troponin 01/27/2016   Poorly controlled type 2 diabetes mellitus with circulatory disorder ([place]) 09/08/2015   Preventative health care 02/05/2015   Benign paroxysmal positional vertigo 01/15/2014   Skin lesion of left arm 05/11/2013   Osteopenia 06/09/2011   Hyperbilirubinemia 05/26/2011   HTN (hypertension) 02/22/2011   Hyperlipidemia 02/22/2011   GERD (gastroesophageal reflux disease) 02/22/2011   Allergic rhinitis 02/22/2011     Immunization History  Administered Date(s) Administered   Fluad Quad(high Dose 65+) 05/15/2019, 04/25/2020, 06/24/2021   H1N1 11/19/2008   Influenza Split 05/25/2011, 04/20/2012   Influenza Whole 06/06/2009   Influenza, High Dose Seasonal PF 04/20/2016, 05/12/2017, 05/26/2018   Influenza,inj,Quad PF,6+ Mos 05/07/2013, 05/27/2014, 05/13/2015   Influenza-Unspecified 05/09/2004, 06/09/2005, 06/15/2006, 07/11/2007, 05/15/2008, 06/06/2009   PFIZER(Purple Top)SARS-COV-2 Vaccination 09/30/2019, 10/24/2019, 07/22/2020   Pneumococcal Conjugate-13 10/19/2004, 05/11/2013   Pneumococcal Polysaccharide-23 05/25/2011, 09/08/2018   Td 08/09/2001, 08/25/2011   Zoster Recombinat (Shingrix) 01/22/2021, 05/20/2021   Zoster, Live 08/25/2011    Conditions to be addressed/monitored: CAD, HTN, HLD, DMII, and atrial flutter  Care Plan : General Pharmacy (Adult)  Updates made by Cherre Robins, RPH-CPP since 08/04/2021 12:00 AM     Problem: HTN, HLD, Afib, DM   Priority: High  Onset Date: 09/25/2020     Long-Range Goal: Provide medication management and chronic conditions management for: DM, HTN, atrial fibrillation and other chronic conditions   Start Date: 09/25/2020  Expected End Date: 03/25/2021  Recent Progress: On track  Priority: High  Note:   Current Barriers:  Unable to independently afford treatment regimen - assistance in progress for Eliquis, Novolog and Levemir Provide medication and disease state management for diabetes; hypertension, hyperlipidemia and atrial fibrillation  Pharmacist Clinical Goal(s):  Over the next 180 days, patient will verbalize ability to afford treatment regimen maintain control of blood sugar and blood pressure as evidenced by home monitoring  contact provider office for questions/concerns as evidenced by notation in electronic health record through collaboration with PharmD and provider.   Interventions: 1:1 collaboration with [name], NP  regarding development and update of comprehensive plan of care as evidenced by provider attestation and co-signature Inter-disciplinary care team collaboration (see longitudinal plan of care) Comprehensive medication review performed; medication list updated in electronic medical record  Hypertension (BP goal <130/80) Controlled Current treatment: Lisinopril 54m daily Metoprolol succinate 269mdaily Medications previously tried: amlodipine (unsure why d/c)  Current home readings per patient: 122 to 136 / 70's Denies hypotensive/hypertensive symptoms - no chest pain or dizziness Interventions;  Reviewed BP goals and benefits of medications for prevention of heart attack, stroke and kidney damage; Continue  to exercise. Work up as able to goal of 150 minutes per week. Counseled to monitor BP at home periodically, document, and provide log at future appointments Recommended to continue current medication  Hyperlipidemia: (LDL goal < 70) Last LDL slightly above goal at 73 (01/23/2021) Current treatment: Atorvastatin 53m daily Medications previously tried: pravastatin Current exercise habits: walking or weight training about 10 minutes 4 days per week Interventions:  Reviewed cholesterol goals - if LDL continues to be >70 could consider increasing atorvastatin to 83mdaily or change to rosuvastatin 4047maily Discussed benefits of statin for ASCVD risk reduction; Recommended to continue current medication  Diabetes (A1c goal <7%) Controlled; Last A1c 6.9% Recent home BG reading ranged from 70 to 150 Reports he usually uses juice to treat hypoglycemia episodes but has glucagon on hand  Current medications: Novolog 4 to 6 units three times a day w/ meals Levemir 12 units at bedtime  Medications previously tried: metformin (GI) Assisted with application for insulin through [employer] has current approved thru 07/08/2021. Patient reports he has not gotten shipments since June 2022.  He did purchase some Novolog and Levemir recently Interventions:  Reviewed recognition and treatment of hypoglycemia.  Reviewed home blood glucose readings and reviewed goals  Fasting blood glucose goal (before meals) = 80 to 130 Blood glucose goal after a meal = less than 180  Reminded to check feet daily and get yearly eye exams Completed [place] assistance program. Forwarded to endocrinology office for Dr GheCruzita Lederer review and sign.   Atrial Flutter (Goal: prevent stroke and major bleeding) Controlled  CHADSVASC:  4 Current treatment: Rate control: Metoprolol succinate 34m32mily Anticoagulation: Eliquis 5mg 68mce daily Medications previously tried: none noted Denies s/s of bleeding or bruising; Denies palpitations Patient completed application for Eliquis patient assistance in 2022 but unfortunately he did not meet the required 3% of income out of pocket spend for the Eliquis patient assistance program in 2022. Will continue to follow for 2023 and assist if needed.  For 2022, Eliquis was $45/month and all his other medications are $0  Interventions:  Counseled on bleeding risk associated with Eliquis and importance of self-monitoring for signs/symptoms of bleeding; Recommended continue current medications  Medication Management: Will continue to review adherence and follow up with patient as needed Continue to take medications as prescribed.   Patient Goals/Self-Care Activities Over the next 180 days, patient will:  take medications as prescribed check glucose daily, document, and provide at future appointments collaborate with provider and clinical pharmacist on medication access solutions  Follow Up Plan: The care management team will reach out to the patient again in early January 2023 to follow up on Novo [place]    Medication Assistance:  Levemir and Novolog obtained through Nov[employer] assistance program.  Enrollment ends 07/08/2021     Patient has completed patient assistance program for Levemir and Novology for 2023. Application was reviewed and completed except provider information. Forwarded by fax to his endocrinology office for review and signature.   [facility]  [facility] [address] [place]: [phone] [place]: [phone] [place] Address) [place] [address] [place]  Phone: [phone] Fax: [phone]    Follow Up:  Patient agrees to Care Plan and Follow-up.  Plan: The care management team will reach out to the patient again over the next 30 days.  Cherre Robins, PharmD Clinical Pharmacist [place] [facility]

## 2021-08-05 ENCOUNTER — Telehealth: Payer: Self-pay

## 2021-08-05 NOTE — Telephone Encounter (Signed)
Inbound fax requesting forms be completed and faxed. Forms completed and faxed to [employer] at [phone].

## 2021-08-08 DIAGNOSIS — E7800 Pure hypercholesterolemia, unspecified: Secondary | ICD-10-CM | POA: Diagnosis not present

## 2021-08-08 DIAGNOSIS — E119 Type 2 diabetes mellitus without complications: Secondary | ICD-10-CM | POA: Diagnosis not present

## 2021-08-08 DIAGNOSIS — Z794 Long term (current) use of insulin: Secondary | ICD-10-CM | POA: Diagnosis not present

## 2021-08-11 ENCOUNTER — Other Ambulatory Visit (HOSPITAL_BASED_OUTPATIENT_CLINIC_OR_DEPARTMENT_OTHER): Payer: Self-pay

## 2021-08-11 ENCOUNTER — Other Ambulatory Visit: Payer: Self-pay | Admitting: Family

## 2021-08-11 DIAGNOSIS — I10 Essential (primary) hypertension: Secondary | ICD-10-CM

## 2021-08-11 MED ORDER — FUROSEMIDE 20 MG PO TABS
ORAL_TABLET | Freq: Every day | ORAL | 1 refills | Status: DC
  Filled 2021-08-11: qty 90, 90d supply, fill #0

## 2021-08-11 MED ORDER — ATORVASTATIN CALCIUM 40 MG PO TABS
40.0000 mg | ORAL_TABLET | Freq: Every day | ORAL | 1 refills | Status: DC
  Filled 2021-08-11: qty 90, 90d supply, fill #0
  Filled 2021-11-16: qty 90, 90d supply, fill #1

## 2021-08-11 MED ORDER — LISINOPRIL 5 MG PO TABS
ORAL_TABLET | Freq: Every day | ORAL | 1 refills | Status: DC
  Filled 2021-08-11: qty 90, 90d supply, fill #0

## 2021-08-11 MED ORDER — METOPROLOL SUCCINATE ER 25 MG PO TB24
ORAL_TABLET | Freq: Every day | ORAL | 1 refills | Status: DC
  Filled 2021-08-11: qty 90, 90d supply, fill #0
  Filled 2021-11-16: qty 90, 90d supply, fill #1

## 2021-08-17 ENCOUNTER — Ambulatory Visit (INDEPENDENT_AMBULATORY_CARE_PROVIDER_SITE_OTHER): Payer: HMO | Admitting: Pharmacist

## 2021-08-17 DIAGNOSIS — I10 Essential (primary) hypertension: Secondary | ICD-10-CM

## 2021-08-17 DIAGNOSIS — Z794 Long term (current) use of insulin: Secondary | ICD-10-CM

## 2021-08-17 DIAGNOSIS — E119 Type 2 diabetes mellitus without complications: Secondary | ICD-10-CM

## 2021-08-17 DIAGNOSIS — E7800 Pure hypercholesterolemia, unspecified: Secondary | ICD-10-CM

## 2021-08-19 ENCOUNTER — Other Ambulatory Visit (HOSPITAL_BASED_OUTPATIENT_CLINIC_OR_DEPARTMENT_OTHER): Payer: Self-pay

## 2021-08-21 NOTE — Patient Instructions (Addendum)
Ryan [name] Our next appointment is by telephone on   Please call the care guide team at [phone] if you need to cancel or reschedule your appointment.   If you have any questions or concerns, please feel free to contact me either at the phone number below or with a MyChart message.   Keep up the good work!  Henrene Pastor, PharmD Clinical Pharmacist [facility] Primary Care [facility] [phone] (direct line)  [phone] (main office number)   Visit Information     Hypertension BP Readings from Last 3 Encounters:  07/27/21 (!) 156/84  06/30/21 120/68  02/26/21 128/80   Pharmacist Clinical Goal(s): Over the next 90 days, patient will work with PharmD and providers to achieve BP goal <140/90 Current regimen:  Lisinopril 5mg  daily Metoprolol succinate 25mg  daily Patient self care activities - Over the next 90 days, patient will: Check blood pressure daily, document, and provide at future appointments Ensure daily salt intake < 2300 mg/day  Hyperlipidemia Lab Results  Component Value Date/Time   [facility] 01/23/2021 10:35 AM   Pharmacist Clinical Goal(s): Over the next 90 days, patient will work with PharmD and providers to achieve LDL goal < 70 Current regimen:  Atorvastatin 40mg  daily Patient self care activities - Over the next 90 days, patient will: Maintain cholesterol medication regimen.  Due to check lipids with next labs  Diabetes Lab Results  Component Value Date/Time   HGBA1C 6.9 (A) 06/30/2021 04:30 PM   HGBA1C 6.5 (A) 02/26/2021 10:35 AM   HGBA1C 7.2 (H) 10/09/2019 01:15 PM   HGBA1C 7.9 (H) 01/27/2016 03:18 PM   Pharmacist Clinical Goal(s): Over the next 90 days, patient will work with PharmD and providers to maintain A1c goal <7% and prevent hypoglycemia Current regimen:  Novolog 4 to 6 units 3 times a day w/ meals Levemir 12 units at bedtime Interventions: Coordinating completion of patient assistance for 02/28/2021 for 2023 -  Novolog and Levemir. Forwarded application to endocrinology office for review and signature.   Patient self care activities - Over the next 90 days, patient will: Check blood sugar 3-4 times daily, document, and provide at future appointments Contact provider with any episodes of hypoglycemia  Atrial Flutter:  Pharmacist Clinical Goal(s) Over the next 90 days, patient will work with PharmD and providers to prevent stroke and control heart rate Current treatment:  Eliquis 5mg  twice a day Metoprolol succinate 25mg  daily Interventions: Coordinated with pharmacy to get current out of pocket spend for 2022. Not at required OOP spend for Eliquis patient assistance application. Has to spend 3% of income in 2022. Will continue to follow for 2023.  Patient self care activities - Over the next 90 days, patient will: Continue current therapy for atrial fibrillation   Medication management Pharmacist Clinical Goal(s): Over the next 90 days, patient will work with PharmD and providers to maintain optimal medication adherence Current pharmacy: Pharmacy Interventions Comprehensive medication review performed. Continue current medication management strategy Patient self care activities - Over the next 90 days, patient will: Focus on medication adherence by filling and taking medications appropriately  Take medications as prescribed Report any questions or concerns to PharmD and/or provider(s)  Patient verbalizes understanding of instructions and care plan provided today and agrees to view in MyChart. Active MyChart status confirmed with patient.

## 2021-08-21 NOTE — Chronic Care Management (AMB) (Signed)
Chronic Care Management Pharmacy Note  08/21/2021 Name:  Ryan [name] MRN:  [id number] DOB:  Oct 04, 1945  Subjective: Ryan [name] is an 76 y.o. year old male who is a primary patient of [name], NP.  The CCM team was consulted for assistance with disease management and care coordination needs.    Collaboration with patient assistance program for [employer] and patient  for  submission of 3903 application for patient assistance program (Levemir and Novolog)   in response to provider referral for pharmacy case management and/or care coordination services.   Consent to Services:  The patient was given information about Chronic Care Management services, agreed to services, and gave verbal consent prior to initiation of services.  Please see initial visit note for detailed documentation.   Patient Care Team: [name], NP as PCP - General (Internal Medicine) Fay Records, MD as PCP - Cardiology (Cardiology) [name], MD as Consulting Physician (Endocrinology) Fay Records, MD as Consulting Physician (Cardiology) Cherre Robins, [place] (Pharmacist) Luretha Rued, RN as Case Manager  Recent office visits: 07/27/2021 - PCP [name]) F/U chronic conditions. Office BP elevated but home BPs were in range. No medication changes.  02/02/2021 - AWV 01/23/2021 - PCP [name] - f/u chronic conditions. No medication changes noted   Recent consult visits: 06/30/2021 - Endo (Dr Cruzita Lederer) F/U type 2 DM. A1c was 6.9%. Lowered mealtime Novolog to 2 to 4 units.  03/04/2021 - Cardio phone call - cleared by Dr [name] of dental extration. Hold Eliquis 1 day prior to procedure.  02/26/2021 - Endo (Dr Cruzita Lederer) F/U DM, insuiln dep since 2005. C/O hypoglycemia at night with unawareness. Decreased Levemir to 12 units at bedtime. Lowered Novolog to 4 to 6 units prior to meals 10/30/2020 - Endo (Dr Cruzita Lederer) - Reviewed his BG log. BG mostly at goal but they are usually lower  after meals compared to before meals.  Decreased NovoLog before each meal to 5 to 6 units.  Also, since he was having low BG during night, advised him to reduce Levemir from 25 to 18 units nightly.    Hospital visits: None in previous 6 months  Objective:  Lab Results  Component Value Date   CREATININE 0.96 07/27/2021   CREATININE 1.14 01/23/2021   CREATININE 0.99 07/25/2020    Lab Results  Component Value Date   HGBA1C 6.9 (A) 06/30/2021   Last diabetic Eye exam:  Lab Results  Component Value Date/Time   HMDIABEYEEXA No Retinopathy 01/24/2020 12:00 AM    Last diabetic Foot exam: No results found for: HMDIABFOOTEX      Component Value Date/Time   CHOL 140 01/23/2021 1035   TRIG 102.0 01/23/2021 1035   HDL 46.70 01/23/2021 1035   CHOLHDL 3 01/23/2021 1035   VLDL 20.4 01/23/2021 1035   LDLCALC 73 01/23/2021 1035    Hepatic Function Latest Ref Rng & Units 07/27/2021 01/23/2021 04/06/2019  Total Protein 6.0 - 8.3 g/dL 6.7 7.3 7.0  Albumin 3.5 - 5.2 g/dL 3.9 4.4 3.9  AST 0 - 37 U/L _0 ALT 0 - 53 U/L _1 Alk Phosphatase 39 - 117 U/L 68 74 74  Total Bilirubin 0.2 - 1.2 mg/dL 1.0 1.5(H) 0.8  Bilirubin, Direct 0.0 - 0.3 mg/dL - - -    Lab Results  Component Value Date/Time   TSH 2.335 01/30/2016 04:08 AM   TSH 4.110 01/27/2016 09:05 AM    CBC Latest Ref Rng & Units  09/08/2020 10/06/2017 08/10/2017  WBC 3.4 - 10.8 x10E3/uL 8.9 7.5 9.8  Hemoglobin 13.0 - 17.7 g/dL 14.6 14.0 15.4  Hematocrit 37.5 - 51.0 % 43.4 41.3 46.7  Platelets 150 - 450 x10E3/uL 255 - 227.0    No results found for: VD25OH  Clinical ASCVD: Yes  The 10-year ASCVD risk score (Arnett DK, et al., 2019) is: 59.6%   Values used to calculate the score:     Age: 59 years     Sex: Male     Is Non-Hispanic African American: No     Diabetic: Yes     Tobacco smoker: Yes     Systolic Blood Pressure: 953 mmHg     Is BP treated: Yes     HDL Cholesterol: 46.7 mg/dL     Total Cholesterol: 140  mg/dL    Other: CHADS2VASc = 4  Social History   Tobacco Use  Smoking Status Former   Types: Cigarettes   Quit date: 09/01/1965   Years since quitting: 56.0  Smokeless Tobacco Never   BP Readings from Last 3 Encounters:  07/27/21 (!) 156/84  06/30/21 120/68  02/26/21 128/80   Pulse Readings from Last 3 Encounters:  07/27/21 63  06/30/21 68  02/26/21 61   Wt Readings from Last 3 Encounters:  07/27/21 173 lb (78.5 kg)  06/30/21 170 lb (77.1 kg)  02/26/21 169 lb (76.7 kg)    Assessment: Review of patient past medical history, allergies, medications, health status, including review of consultants reports, laboratory and other test data, was performed as part of comprehensive evaluation and provision of chronic care management services.   SDOH:  (Social Determinants of Health) assessments and interventions performed:     CCM Care Plan  Allergies  Allergen Reactions   Other Other (See Comments)    Client states he became very sick and had significant skin inflammation and irritation with the Freestyle Libre sensor    Medications Reviewed Today     Reviewed by Cherre Robins, RPH-CPP (Pharmacist) on 08/17/21 at 1146  Med List Status: <None>   Medication Order Taking? Sig Documenting Provider Last Dose Status Informant  apixaban (ELIQUIS) 5 MG TABS tablet [id number] Yes TAKE 1 TABLET BY MOUTH TWICE DAILY Fay Records, MD Taking Active   atorvastatin (LIPITOR) 40 MG tablet [id number] Yes TAKE 1 TABLET (40 MG TOTAL) BY MOUTH DAILY. [name], NP Taking Active   Calcium Carbonate-Vitamin D 600-400 MG-UNIT tablet [id number] Yes Take 1 tablet by mouth 2 (two) times daily. Reported on 01/27/2016 [provider] Taking Active Self  cholecalciferol (VITAMIN D3) 25 MCG (1000 UNIT) tablet [id number] Yes Take 1,000 Units by mouth daily. [provider] Taking Active Self  furosemide (LASIX) 20 MG tablet [id number] Yes TAKE 1 TABLET (20 MG TOTAL) BY MOUTH DAILY.  [name], NP Taking Active   Glucagon, rDNA, (GLUCAGON EMERGENCY) 1 MG KIT [id number] Yes Inject 1 mg into the muscle once as needed for up to 1 dose. [name], MD Taking Active   insulin aspart (NOVOLOG) 100 UNIT/ML injection [id number] Yes INJECT 5-6 UNITS INTO THE SKIN 3 TIMES A DAY WITH MEALS [name], MD Taking Active            Med Note [place], [place] B   Wed Jan 14, 2021 10:00 AM) Approved to get from Novo PAP thru 08/08/2021  insulin detemir (LEVEMIR) 100 UNIT/ML injection [id number] Yes INJECT 0.12 ML (12 UNITS) INTO THE SKIN AT BEDTIME [name], MD Taking Active  Med Note Antony Contras, Sandre Kitty   Fri Mar 27, 2021 11:23 AM) Approved thru [place] PAP thru 08/08/2021  Insulin Syringe-Needle U-100 (TRUEPLUS INSULIN SYRINGE) 31G X 5/16" 0.5 ML MISC [id number] Yes USE TO INJECT INSULIN 4 TIMES A [name], MD Taking Active   lisinopril (ZESTRIL) 5 MG tablet [id number] Yes TAKE 1 TABLET (5 MG TOTAL) BY MOUTH DAILY. [name], NP Taking Active   metoprolol succinate (TOPROL-XL) 25 MG 24 hr tablet [id number] Yes TAKE 1 TABLET (25 MG TOTAL) BY MOUTH DAILY. [name], NP Taking Active             Patient Active Problem List   Diagnosis Date Noted   Right hip pain 01/23/2021   Chronic diastolic CHF (congestive heart failure) ([place]) 07/27/2020   Moderate mitral regurgitation 07/27/2020   Overweight (BMI 25.0-29.9) 01/12/2019   Atrial flutter ([place]) 01/27/2016   Elevated troponin 01/27/2016   Poorly controlled type 2 diabetes mellitus with circulatory disorder ([place]) 09/08/2015   Preventative health care 02/05/2015   Benign paroxysmal positional vertigo 01/15/2014   Skin lesion of left arm 05/11/2013   Osteopenia 06/09/2011   Hyperbilirubinemia 05/26/2011   HTN (hypertension) 02/22/2011   Hyperlipidemia 02/22/2011   GERD (gastroesophageal reflux disease) 02/22/2011   Allergic rhinitis 02/22/2011    Immunization  History  Administered Date(s) Administered   Fluad Quad(high Dose 65+) 05/15/2019, 04/25/2020, 06/24/2021   H1N1 11/19/2008   Influenza Split 05/25/2011, 04/20/2012   Influenza Whole 06/06/2009   Influenza, High Dose Seasonal PF 04/20/2016, 05/12/2017, 05/26/2018   Influenza,inj,Quad PF,6+ Mos 05/07/2013, 05/27/2014, 05/13/2015   Influenza-Unspecified 05/09/2004, 06/09/2005, 06/15/2006, 07/11/2007, 05/15/2008, 06/06/2009   PFIZER(Purple Top)SARS-COV-2 Vaccination 09/30/2019, 10/24/2019, 07/22/2020   Pneumococcal Conjugate-13 10/19/2004, 05/11/2013   Pneumococcal Polysaccharide-23 05/25/2011, 09/08/2018   Td 08/09/2001, 08/25/2011   Zoster Recombinat (Shingrix) 01/22/2021, 05/20/2021   Zoster, Live 08/25/2011    Conditions to be addressed/monitored: CAD, HTN, HLD, DMII, and atrial flutter  Care Plan : General Pharmacy (Adult)  Updates made by Cherre Robins, RPH-CPP since 08/21/2021 12:00 AM     Problem: HTN, HLD, Afib, DM   Priority: High  Onset Date: 09/25/2020     Long-Range Goal: Provide medication management and chronic conditions management for: DM, HTN, atrial fibrillation and other chronic conditions   Start Date: 09/25/2020  Expected End Date: 03/25/2021  Recent Progress: On track  Priority: High  Note:   Current Barriers:  Unable to independently afford treatment regimen - assistance in progress for Eliquis, Novolog and Levemir Provide medication and disease state management for diabetes; hypertension, hyperlipidemia and atrial fibrillation  Pharmacist Clinical Goal(s):  Over the next 180 days, patient will verbalize ability to afford treatment regimen maintain control of blood sugar and blood pressure as evidenced by home monitoring  contact provider office for questions/concerns as evidenced by notation in electronic health record through collaboration with PharmD and provider.   Interventions: 1:1 collaboration with [name], NP regarding development and  update of comprehensive plan of care as evidenced by provider attestation and co-signature Inter-disciplinary care team collaboration (see longitudinal plan of care) Comprehensive medication review performed; medication list updated in electronic medical record  Hypertension (BP goal <130/80) Controlled Current treatment: Lisinopril 80m daily Metoprolol succinate 257mdaily Medications previously tried: amlodipine (unsure why d/c)  Current home readings per patient: 122 to 136 / 70's Denies hypotensive/hypertensive symptoms - no chest pain or dizziness Interventions;  Reviewed BP goals and benefits of medications for prevention of heart attack, stroke and kidney damage; Continue  to exercise. Work up as able to goal of 150 minutes per week. Counseled to monitor BP at home periodically, document, and provide log at future appointments Recommended to continue current medication  Hyperlipidemia: (LDL goal < 70) Last LDL slightly above goal at 73 (01/23/2021) Current treatment: Atorvastatin 41m daily Medications previously tried: pravastatin Current exercise habits: walking or weight training about 10 minutes 4 days per week Interventions:  Reviewed cholesterol goals - if LDL continues to be >70 could consider increasing atorvastatin to 839mdaily or change to rosuvastatin 4018maily Discussed benefits of statin for ASCVD risk reduction; Recommended to continue current medication  Diabetes (A1c goal <7%) Controlled; Last A1c 6.9% Recent home BG reading ranged from 70 to 150 Reports he usually uses juice to treat hypoglycemia episodes but has glucagon on hand  Current medications: Novolog 4 to 6 units three times a day w/ meals Levemir 12 units at bedtime  Medications previously tried: metformin (GI) Assisted with application for insulin through [employer] has current approved thru 07/08/2021. Patient reports he has not gotten shipments since June 2022. He did purchase some  Novolog and Levemir recently. [employer] - insulin shipment requested in December 2022 has not shipped - they are about 4 weeks behind. Recommended recheck on 1 week Interventions:  Reviewed recognition and treatment of hypoglycemia.  Reviewed home blood glucose readings and reviewed goals  Fasting blood glucose goal (before meals) = 80 to 130 Blood glucose goal after a meal = less than 180  Reminded to check feet daily and get yearly eye exams Coordinated with Nov[employer]leyvillegarding [id number] - missing page 3 and also need different income verification. Requested 1st page of 1040 from patient - he will drop off to office. Refaxed patient assistance program page 3 of application.  Atrial Flutter (Goal: prevent stroke and major bleeding) Controlled  CHADSVASC:  4 Current treatment: Rate control: Metoprolol succinate 54m46mily Anticoagulation: Eliquis 5mg 39mce daily Medications previously tried: none noted Denies s/s of bleeding or bruising; Denies palpitations Patient completed application for Eliquis patient assistance in 2022 but unfortunately he did not meet the required 3% of income out of pocket spend for the Eliquis patient assistance program in 2022. Will continue to follow for 2023 and assist if needed.  For 2022, Eliquis was $45/month and all his other medications are $0  Interventions: (addressed at previous visit) Counseled on bleeding risk associated with Eliquis and importance of self-monitoring for signs/symptoms of bleeding; Recommended continue current medications  Medication Management: Will continue to review adherence and follow up with patient as needed Continue to take medications as prescribed.   Patient Goals/Self-Care Activities Over the next 180 days, patient will:  take medications as prescribed check glucose daily, document, and provide at future appointments collaborate with provider and clinical pharmacist on medication access  solutions  Follow Up Plan: The care management team will reach out to the patient again in 2 week to follow up on Novo [place]    Medication Assistance:  LevemConradined through Novo [employer] assistance program.  Enrollment ends 07/08/2021    Patient has completed patient assistance program for Levemir and Novology for 2023. Application was reviewed and completed except provider information. Forwarded by fax to his endocrinology office for review and signature.   Patient[facility] pharmacy is:  [facility] [id number]: [phone] [place] [address] Phone: [phone] Fax: [phone]  [facility] (New Address) - [place], [place] - [address] AT Previously: [address] [place] [address] Phone: [phone] Fax: [phone]    Follow Up:  Patient agrees to Care Plan and Follow-up.  Plan: The care management team will reach out to the patient again over the next 14 days.  Cherre Robins, PharmD Clinical Pharmacist [facility]

## 2021-08-27 ENCOUNTER — Telehealth: Payer: Self-pay | Admitting: Family

## 2021-08-27 NOTE — Telephone Encounter (Signed)
Patient's son has been informed that [employer] and will inform [place]. Unable to contact [place] directly.

## 2021-08-27 NOTE — Telephone Encounter (Signed)
Pt son dropped of package within priority mail package for [name]. Placed within providers tray.

## 2021-08-28 NOTE — Telephone Encounter (Signed)
Items received: 1 box of Levermir Flextouch received  3 boxes of pen needles 1 box of Novolog

## 2021-09-01 NOTE — Telephone Encounter (Signed)
Reviewed documents and faxed to patient assistance program

## 2021-09-04 ENCOUNTER — Ambulatory Visit: Payer: HMO | Admitting: Pharmacist

## 2021-09-04 DIAGNOSIS — Z794 Long term (current) use of insulin: Secondary | ICD-10-CM

## 2021-09-04 DIAGNOSIS — E119 Type 2 diabetes mellitus without complications: Secondary | ICD-10-CM

## 2021-09-04 NOTE — Patient Instructions (Signed)
[name] °Our next appointment is by telephone on  ° °Please call the care guide team at [phone] if you need to cancel or reschedule your appointment.  ° °If you have any questions or concerns, please feel free to contact me either at the phone number below or with a MyChart message.  ° °Keep up the good work! ° °Ryan [name], PharmD °Clinical Pharmacist °[facility] °[facility] °[phone] (direct line)  °[phone] (main office number) ° ° °Visit Information ° ° °  °Hypertension °BP Readings from Last 3 Encounters:  °07/27/21 (!) 156/84  °06/30/21 120/68  °02/26/21 128/80  ° °Pharmacist Clinical Goal(s): °Over the next 90 days, patient will work with PharmD and providers to achieve BP goal <140/90 °Current regimen:  °Lisinopril 5mg daily °Metoprolol succinate 25mg daily °Patient self care activities - Over the next 90 days, patient will: °Check blood pressure daily, document, and provide at future appointments °Ensure daily salt intake < 2300 mg/day ° °Hyperlipidemia °Lab Results  °Component Value Date/Time  ° LDLCALC 73 01/23/2021 10:35 AM  ° °Pharmacist Clinical Goal(s): °Over the next 90 days, patient will work with PharmD and providers to achieve LDL goal < 70 °Current regimen:  °Atorvastatin 40mg daily °Patient self care activities - Over the next 90 days, patient will: °Maintain cholesterol medication regimen.  °Due to check lipids with next labs ° °Diabetes °Lab Results  °Component Value Date/Time  ° HGBA1C 6.9 (A) 06/30/2021 04:30 PM  ° HGBA1C 6.5 (A) 02/26/2021 10:35 AM  ° HGBA1C 7.2 (H) 10/09/2019 01:15 PM  ° HGBA1C 7.9 (H) 01/27/2016 03:18 PM  ° °Pharmacist Clinical Goal(s): °Over the next 90 days, patient will work with PharmD and providers to maintain A1c goal <7% and prevent hypoglycemia °Current regimen:  °Novolog 4 to 6 units 3 times a day w/ meals °Levemir 12 units at bedtime °Interventions: °Coordinating completion of patient assistance for Novo Nordisk for 2023 -  Novolog and Levemir. Forwarded application to endocrinology office for review and signature.   °Patient self care activities - Over the next 90 days, patient will: °Check blood sugar 3-4 times daily, document, and provide at future appointments °Contact provider with any episodes of hypoglycemia ° °Atrial Flutter:  °Pharmacist Clinical Goal(s) °Over the next 90 days, patient will work with PharmD and providers to prevent stroke and control heart rate °Current treatment:  °Eliquis 5mg twice a day °Metoprolol succinate 25mg daily °Interventions: °Coordinated with pharmacy to get current out of pocket spend for 2022. Not at required OOP spend for Eliquis patient assistance application. Has to spend 3% of income in 2022. Will continue to follow for 2023.  °Patient self care activities - Over the next 90 days, patient will: °Continue current therapy for atrial fibrillation  ° °Medication management °Pharmacist Clinical Goal(s): °Over the next 90 days, patient will work with PharmD and providers to maintain optimal medication adherence °Current pharmacy: [facility] °Interventions °Comprehensive medication review performed. °Continue current medication management strategy °Patient self care activities - Over the next 90 days, patient will: °Focus on medication adherence by filling and taking medications appropriately  °Take medications as prescribed °Report any questions or concerns to PharmD and/or provider(s) ° °Patient verbalizes understanding of instructions and care plan provided today and agrees to view in MyChart. Active MyChart status confirmed with patient.   ° ° ° ° °

## 2021-09-04 NOTE — Chronic Care Management (AMB) (Signed)
Chronic Care Management Pharmacy Note  09/04/2021 Name:  Ryan [name] MRN:  [id number] DOB:  14-Mar-1946  Summary:  08/31/2021 Coordinated with [place] regarding 3016 application - missing page 3 and also needed different income verification. Requested 1st page of 1040 from patient - he will drop off to office. Refaxed patient assistance program page 3 of application.  09/04/2021 - patient brought in financial information 08/28/2021 and was faxed 08/31/2021; [employer] but representative states has not been received yet. Refaxed patient's updated financial information today.   Subjective: Ryan [name] is an 76 y.o. year old male who is a primary patient of [name], NP.  The CCM team was consulted for assistance with disease management and care coordination needs.    Collaboration with patient assistance program for [employer] and patient  for  submission of 0109 application for patient assistance program (Levemir and Novolog)   in response to provider referral for pharmacy case management and/or care coordination services.   Consent to Services:  The patient was given information about Chronic Care Management services, agreed to services, and gave verbal consent prior to initiation of services.  Please see initial visit note for detailed documentation.   Patient Care Team: [name], NP as PCP - General (Internal Medicine) Fay Records, MD as PCP - Cardiology (Cardiology) [name], MD as Consulting Physician (Endocrinology) Fay Records, MD as Consulting Physician (Cardiology) Cherre Robins, [place] (Pharmacist) Luretha Rued, RN as Case Manager  Recent office visits: 07/27/2021 - PCP [name]) F/U chronic conditions. Office BP elevated but home BPs were in range. No medication changes.  02/02/2021 - AWV 01/23/2021 - PCP [name] - f/u chronic conditions. No medication changes noted   Recent consult visits: 06/30/2021 - Endo (Dr  Cruzita Lederer) F/U type 2 DM. A1c was 6.9%. Lowered mealtime Novolog to 2 to 4 units.  03/04/2021 - Cardio phone call - cleared by Dr [name] of dental extration. Hold Eliquis 1 day prior to procedure.  02/26/2021 - Endo (Dr Cruzita Lederer) F/U DM, insuiln dep since 2005. C/O hypoglycemia at night with unawareness. Decreased Levemir to 12 units at bedtime. Lowered Novolog to 4 to 6 units prior to meals 10/30/2020 - Endo (Dr Cruzita Lederer) - Reviewed his BG log. BG mostly at goal but they are usually lower after meals compared to before meals.  Decreased NovoLog before each meal to 5 to 6 units.  Also, since he was having low BG during night, advised him to reduce Levemir from 25 to 18 units nightly.    Hospital visits: None in previous 6 months  Objective:  Lab Results  Component Value Date   CREATININE 0.96 07/27/2021   CREATININE 1.14 01/23/2021   CREATININE 0.99 07/25/2020    Lab Results  Component Value Date   HGBA1C 6.9 (A) 06/30/2021   Last diabetic Eye exam:  Lab Results  Component Value Date/Time   HMDIABEYEEXA No Retinopathy 01/24/2020 12:00 AM    Last diabetic Foot exam: No results found for: HMDIABFOOTEX      Component Value Date/Time   CHOL 140 01/23/2021 1035   TRIG 102.0 01/23/2021 1035   HDL 46.70 01/23/2021 1035   CHOLHDL 3 01/23/2021 1035   VLDL 20.4 01/23/2021 1035   LDLCALC 73 01/23/2021 1035    Hepatic Function Latest Ref Rng & Units 07/27/2021 01/23/2021 04/06/2019  Total Protein 6.0 - 8.3 g/dL 6.7 7.3 7.0  Albumin 3.5 - 5.2 g/dL 3.9 4.4 3.9  AST 0 - 37 U/L 17  17 13  ALT 0 - 53 U/L _0 Alk Phosphatase 39 - 117 U/L 68 74 74  Total Bilirubin 0.2 - 1.2 mg/dL 1.0 1.5(H) 0.8  Bilirubin, Direct 0.0 - 0.3 mg/dL - - -    Lab Results  Component Value Date/Time   TSH 2.335 01/30/2016 04:08 AM   TSH 4.110 01/27/2016 09:05 AM    CBC Latest Ref Rng & Units 09/08/2020 10/06/2017 08/10/2017  WBC 3.4 - 10.8 x10E3/uL 8.9 7.5 9.8  Hemoglobin 13.0 - 17.7 g/dL 14.6 14.0 15.4   Hematocrit 37.5 - 51.0 % 43.4 41.3 46.7  Platelets 150 - 450 x10E3/uL 255 - 227.0    No results found for: VD25OH  Clinical ASCVD: Yes  The 10-year ASCVD risk score (Arnett DK, et al., 2019) is: 59.6%   Values used to calculate the score:     Age: 76 years     Sex: Male     Is Non-Hispanic African American: No     Diabetic: Yes     Tobacco smoker: Yes     Systolic Blood Pressure: 412 mmHg     Is BP treated: Yes     HDL Cholesterol: 46.7 mg/dL     Total Cholesterol: 140 mg/dL    Other: CHADS2VASc = 4  Social History   Tobacco Use  Smoking Status Former   Types: Cigarettes   Quit date: 09/01/1965   Years since quitting: 56.0  Smokeless Tobacco Never   BP Readings from Last 3 Encounters:  07/27/21 (!) 156/84  06/30/21 120/68  02/26/21 128/80   Pulse Readings from Last 3 Encounters:  07/27/21 63  06/30/21 68  02/26/21 61   Wt Readings from Last 3 Encounters:  07/27/21 173 lb (78.5 kg)  06/30/21 170 lb (77.1 kg)  02/26/21 169 lb (76.7 kg)    Assessment: Review of patient past medical history, allergies, medications, health status, including review of consultants reports, laboratory and other test data, was performed as part of comprehensive evaluation and provision of chronic care management services.   SDOH:  (Social Determinants of Health) assessments and interventions performed:     CCM Care Plan  Allergies  Allergen Reactions   Other Other (See Comments)    Client states he became very sick and had significant skin inflammation and irritation with the Freestyle Libre sensor    Medications Reviewed Today     Reviewed by Cherre Robins, RPH-CPP (Pharmacist) on 09/04/21 at 45  Med List Status: <None>   Medication Order Taking? Sig Documenting Provider Last Dose Status Informant  apixaban (ELIQUIS) 5 MG TABS tablet [id number] No TAKE 1 TABLET BY MOUTH TWICE DAILY Fay Records, MD Taking Active   atorvastatin (LIPITOR) 40 MG tablet [id number] No TAKE 1  TABLET (40 MG TOTAL) BY MOUTH DAILY. [name], NP Taking Active   Calcium Carbonate-Vitamin D 600-400 MG-UNIT tablet [id number] No Take 1 tablet by mouth 2 (two) times daily. Reported on 01/27/2016 [provider] Taking Active Self  cholecalciferol (VITAMIN D3) 25 MCG (1000 UNIT) tablet [id number] No Take 1,000 Units by mouth daily. [provider] Taking Active Self  furosemide (LASIX) 20 MG tablet [id number] No TAKE 1 TABLET (20 MG TOTAL) BY MOUTH DAILY. [name], NP Taking Active   Glucagon, rDNA, (GLUCAGON EMERGENCY) 1 MG KIT [id number] No Inject 1 mg into the muscle once as needed for up to 1 dose. [name], MD Taking Active   insulin aspart (NOVOLOG) 100 UNIT/ML injection [id number] No INJECT 5-6 UNITS  INTO THE SKIN 3 TIMES A DAY WITH MEALS [name], MD Taking Active            Med Note Antony Contras, [place] B   Wed Jan 14, 2021 10:00 AM) Approved to get from Novo PAP thru 08/08/2021  insulin detemir (LEVEMIR) 100 UNIT/ML injection [id number] No INJECT 0.12 ML (12 UNITS) INTO THE SKIN AT BEDTIME [name], MD Taking Active            Med Note Antony Contras, Sandre Kitty   Fri Mar 27, 2021 11:23 AM) Approved thru [place] PAP thru 08/08/2021  Insulin Syringe-Needle U-100 (TRUEPLUS INSULIN SYRINGE) 31G X 5/16" 0.5 ML MISC [id number] No USE TO INJECT INSULIN 4 TIMES A [name], MD Taking Active   lisinopril (ZESTRIL) 5 MG tablet [id number] No TAKE 1 TABLET (5 MG TOTAL) BY MOUTH DAILY. [name], NP Taking Active   metoprolol succinate (TOPROL-XL) 25 MG 24 hr tablet [id number] No TAKE 1 TABLET (25 MG TOTAL) BY MOUTH DAILY. [name], NP Taking Active             Patient Active Problem List   Diagnosis Date Noted   Right hip pain 01/23/2021   Chronic diastolic CHF (congestive heart failure) ([place]) 07/27/2020   Moderate mitral regurgitation 07/27/2020   Overweight (BMI 25.0-29.9) 01/12/2019   Atrial flutter ([place])  01/27/2016   Elevated troponin 01/27/2016   Poorly controlled type 2 diabetes mellitus with circulatory disorder ([place]) 09/08/2015   Preventative health care 02/05/2015   Benign paroxysmal positional vertigo 01/15/2014   Skin lesion of left arm 05/11/2013   Osteopenia 06/09/2011   Hyperbilirubinemia 05/26/2011   HTN (hypertension) 02/22/2011   Hyperlipidemia 02/22/2011   GERD (gastroesophageal reflux disease) 02/22/2011   Allergic rhinitis 02/22/2011    Immunization History  Administered Date(s) Administered   Fluad Quad(high Dose 65+) 05/15/2019, 04/25/2020, 06/24/2021   H1N1 11/19/2008   Influenza Split 05/25/2011, 04/20/2012   Influenza Whole 06/06/2009   Influenza, High Dose Seasonal PF 04/20/2016, 05/12/2017, 05/26/2018   Influenza,inj,Quad PF,6+ Mos 05/07/2013, 05/27/2014, 05/13/2015   Influenza-Unspecified 05/09/2004, 06/09/2005, 06/15/2006, 07/11/2007, 05/15/2008, 06/06/2009   PFIZER(Purple Top)SARS-COV-2 Vaccination 09/30/2019, 10/24/2019, 07/22/2020   Pneumococcal Conjugate-13 10/19/2004, 05/11/2013   Pneumococcal Polysaccharide-23 05/25/2011, 09/08/2018   Td 08/09/2001, 08/25/2011   Zoster Recombinat (Shingrix) 01/22/2021, 05/20/2021   Zoster, Live 08/25/2011    Conditions to be addressed/monitored: CAD, HTN, HLD, DMII, and atrial flutter  Care Plan : General Pharmacy (Adult)  Updates made by Cherre Robins, RPH-CPP since 09/04/2021 12:00 AM     Problem: HTN, HLD, Afib, DM   Priority: High  Onset Date: 09/25/2020     Long-Range Goal: Provide medication management and chronic conditions management for: DM, HTN, atrial fibrillation and other chronic conditions   Start Date: 09/25/2020  Expected End Date: 03/25/2021  Recent Progress: On track  Priority: High  Note:   Current Barriers:  Unable to independently afford treatment regimen - assistance in progress for Eliquis, Novolog and Levemir Provide medication and disease state management for diabetes;  hypertension, hyperlipidemia and atrial fibrillation  Pharmacist Clinical Goal(s):  Over the next 180 days, patient will verbalize ability to afford treatment regimen maintain control of blood sugar and blood pressure as evidenced by home monitoring  contact provider office for questions/concerns as evidenced by notation in electronic health record through collaboration with PharmD and provider.   Interventions: 1:1 collaboration with [name], NP regarding development and update of comprehensive plan of care as evidenced by provider attestation and co-signature  Inter-disciplinary care team collaboration (see longitudinal plan of care) Comprehensive medication review performed; medication list updated in electronic medical record  Hypertension (BP goal <130/80) Controlled Current treatment: Lisinopril 1m daily Metoprolol succinate 245mdaily Medications previously tried: amlodipine (unsure why d/c)  Current home readings per patient: 122 to 136 / 70's Denies hypotensive/hypertensive symptoms - no chest pain or dizziness Interventions;  Reviewed BP goals and benefits of medications for prevention of heart attack, stroke and kidney damage; Continue to exercise. Work up as able to goal of 150 minutes per week. Counseled to monitor BP at home periodically, document, and provide log at future appointments Recommended to continue current medication  Hyperlipidemia: (LDL goal < 70) Last LDL slightly above goal at 73 (01/23/2021) Current treatment: Atorvastatin 4057maily Medications previously tried: pravastatin Current exercise habits: walking or weight training about 10 minutes 4 days per week Interventions:  Reviewed cholesterol goals - if LDL continues to be >70 could consider increasing atorvastatin to 21m67mily or change to rosuvastatin 40mg52mly Discussed benefits of statin for ASCVD risk reduction; Recommended to continue current medication  Diabetes (A1c goal  <7%) Controlled; Last A1c 6.9% Recent home BG reading ranged from 70 to 150 Reports he usually uses juice to treat hypoglycemia episodes but has glucagon on hand  Current medications: Novolog 4 to 6 units three times a day w/ meals Levemir 12 units at bedtime  Medications previously tried: metformin (GI) Assisted with application for insulin through [employer] has current approved thru 07/08/2021. Patient reports he has not gotten shipments since June 2022. He did purchase some Novolog and Levemir recently. [employer] - insulin shipment requested in December 2022 has not shipped - they are about 4 weeks behind. Recommended recheck on 1 week Interventions:  Reviewed recognition and treatment of hypoglycemia.  Reviewed home blood glucose readings and reviewed goals  Fasting blood glucose goal (before meals) = 80 to 130 Blood glucose goal after a meal = less than 180  Reminded to check feet daily and get yearly eye exams 08/31/2021 Coordinated with [employer] 2023 2947ication - missing page 3 and also need different income verification. Requested 1st page of 1040 from patient - he will drop off to office. Refaxed patient assistance program page 3 of application.  09/04/2021 - brought in financial information 08/28/2021 and was faxed 08/31/2021; [employer] states has not been received yet. Refaxed patient's updated financial information  Atrial Flutter (Goal: prevent stroke and major bleeding) Controlled  CHADSVASC:  4 Current treatment: Rate control: Metoprolol succinate 25mg 53my Anticoagulation: Eliquis 5mg tw53m daily Medications previously tried: none noted Denies s/s of bleeding or bruising; Denies palpitations Patient completed application for Eliquis patient assistance in 2022 but unfortunately he did not meet the required 3% of income out of pocket spend for the Eliquis patient assistance program in 2022. Will continue to follow for  2023 and assist if needed.  For 2022, Eliquis was $45/month and all his other medications are $0  Interventions: (addressed at previous visit) Counseled on bleeding risk associated with Eliquis and importance of self-monitoring for signs/symptoms of bleeding; Recommended continue current medications  Medication Management: Will continue to review adherence and follow up with patient as needed Continue to take medications as prescribed.   Patient Goals/Self-Care Activities Over the next 180 days, patient will:  take medications as prescribed check glucose daily, document, and provide at future appointments collaborate with provider and clinical pharmacist on medication access solutions  Follow  Up Plan: The care management team will reach out to the patient again in 2 week to follow up on [place] PAP     Medication Assistance:  Levemir and Novolog obtained through [employer]  medication assistance program.  Enrollment ends 07/08/2021    Patient has completed patient assistance program for Levemir and Novology for 2023. Application was reviewed and completed except provider information. Forwarded by fax to his endocrinology office for review and signature.   Patient's preferred pharmacy is:  [facility] [address] [place] [address] Phone: [phone] Fax: [phone]  [place] [address] Phone: [phone] Fax: [phone]  [facility] (New Address) - [place], [place] - [address] AT Previously: [address] [place] [address] Phone: [phone] Fax: [phone]    Follow Up:  Patient agrees to Care Plan and Follow-up.  Plan: The care management team will reach out to the patient again over the next 14 days.  Cherre Robins, PharmD Clinical  Pharmacist [facility]

## 2021-09-08 DIAGNOSIS — E7800 Pure hypercholesterolemia, unspecified: Secondary | ICD-10-CM

## 2021-09-08 DIAGNOSIS — Z794 Long term (current) use of insulin: Secondary | ICD-10-CM | POA: Diagnosis not present

## 2021-09-08 DIAGNOSIS — E119 Type 2 diabetes mellitus without complications: Secondary | ICD-10-CM | POA: Diagnosis not present

## 2021-09-08 DIAGNOSIS — I10 Essential (primary) hypertension: Secondary | ICD-10-CM

## 2021-09-18 ENCOUNTER — Other Ambulatory Visit: Payer: Self-pay | Admitting: Internal Medicine

## 2021-09-18 ENCOUNTER — Other Ambulatory Visit (HOSPITAL_BASED_OUTPATIENT_CLINIC_OR_DEPARTMENT_OTHER): Payer: Self-pay

## 2021-09-18 MED ORDER — APIXABAN 5 MG PO TABS
ORAL_TABLET | Freq: Two times a day (BID) | ORAL | 5 refills | Status: DC
  Filled 2021-09-18: qty 60, 30d supply, fill #0
  Filled 2021-10-20: qty 60, 30d supply, fill #1
  Filled 2021-11-20: qty 60, 30d supply, fill #2
  Filled 2021-12-23: qty 60, 30d supply, fill #3
  Filled 2022-01-21: qty 60, 30d supply, fill #4
  Filled 2022-02-15: qty 60, 30d supply, fill #5

## 2021-09-18 NOTE — Telephone Encounter (Signed)
Eliquis 5 mg refill request received. Patient is 76 years old, weight- 78.5 kg, Crea- 0.96 on 07/27/21, Diagnosis-atrial flutter, and last seen by Dr. Tenny Craw on 09/08/20. Dose is appropriate based on dosing criteria. Will send in refill to requested pharmacy.

## 2021-10-05 NOTE — Progress Notes (Signed)
? ?Cardiology Office Note ? ? ?Date:  10/07/2021  ? ?ID:  Ryan [name], DOB Aug 09, 1946, MRN [id number] ? ?PCP:  [name], NP  ?Cardiologist:   Dorris Carnes, MD  ? ?F/U of atrial flutter and HTN   ? ?  ?History of Present Illness: ?Ryan [name] is a 76 y.o. male with a history of atrial flutter  Placed on Eliquis in 2017Hx of  Lexiscan in Aug 2017 was normal  Echo in Sept 2021 showed  LVEF and RVEF wre normal  MR was mild to moderate ? ? ?I saw the pt in clinic in early 2022   ?Since seen has done well  No palpitations   No CP  Breathing is OK   No dizziness ?The pt says he is active        ?BP at home   117 ? ? ?Diet:    ?Breakfast:  Egg beaters   Water ?Lunch   Pasta     ?Dinner    Neurosurgeon or veggiese     ? ? ? ?Outpatient Medications Prior to Visit  ?Medication Sig Dispense Refill  ? apixaban (ELIQUIS) 5 MG TABS tablet TAKE 1 TABLET BY MOUTH TWICE DAILY 60 tablet 5  ? atorvastatin (LIPITOR) 40 MG tablet TAKE 1 TABLET (40 MG TOTAL) BY MOUTH DAILY. 90 tablet 1  ? Calcium Carbonate-Vitamin D 600-400 MG-UNIT tablet Take 1 tablet by mouth 2 (two) times daily. Reported on 01/27/2016    ? cholecalciferol (VITAMIN D3) 25 MCG (1000 UNIT) tablet Take 1,000 Units by mouth daily.    ? furosemide (LASIX) 20 MG tablet TAKE 1 TABLET (20 MG TOTAL) BY MOUTH DAILY. 90 tablet 1  ? Glucagon, rDNA, (GLUCAGON EMERGENCY) 1 MG KIT Inject 1 mg into the muscle once as needed for up to 1 dose. 1 kit 12  ? insulin aspart (NOVOLOG) 100 UNIT/ML injection INJECT 5-6 UNITS INTO THE SKIN 3 TIMES A DAY WITH MEALS 30 mL 5  ? insulin detemir (LEVEMIR) 100 UNIT/ML injection INJECT 0.12 ML (12 UNITS) INTO THE SKIN AT BEDTIME 30 mL 5  ? Insulin Syringe-Needle U-100 (TRUEPLUS INSULIN SYRINGE) 31G X 5/16" 0.5 ML MISC USE TO INJECT INSULIN 4 TIMES A DAY 300 each 3  ? lisinopril (ZESTRIL) 5 MG tablet TAKE 1 TABLET (5 MG TOTAL) BY MOUTH DAILY. 90 tablet 1  ? metoprolol succinate (TOPROL-XL) 25 MG 24 hr tablet TAKE 1 TABLET (25 MG TOTAL) BY MOUTH  DAILY. 90 tablet 1  ? ?No facility-administered medications prior to visit.  ? ? ? ?Allergies:   Other  ? ?Past Medical History:  ?Diagnosis Date  ? Allergy   ? Diabetes mellitus   ? GERD (gastroesophageal reflux disease)   ? History of chicken pox   ? Hyperlipidemia   ? Hypertension   ? Myocardial infarction [facility]) 2017  ? Type 2 diabetes mellitus with hyperglycemia, with long-term current use of insulin ([place]) 09/08/2015  ? ? ?Past Surgical History:  ?Procedure Laterality Date  ? EYE SURGERY  1998  ? bilateral  ? SHOULDER SURGERY  1990  ? right shoulder, torn rotator cuff.  ? ? ? ?Social History:  The patient  reports that he quit smoking about 56 years ago. His smoking use included cigarettes. He has never used smokeless tobacco. He reports that he does not drink alcohol and does not use drugs.  ? ?Family History:  The patient's family history includes Cancer in his mother; Diabetes in his paternal grandfather; Heart disease in  his mother; Hyperlipidemia in his father and mother; Hypertension in his father and mother; Stroke in his father.  ? ? ?ROS:  Please see the history of present illness. All other systems are reviewed and  Negative to the above problem except as noted.  ? ? ?PHYSICAL EXAM: ?VS:  BP (!) 148/68   Pulse 61   Ht _0  (1.753 m)   Wt 167 lb 6.4 oz (75.9 kg)   SpO2 98%   BMI 24.72 kg/m?   ?GEN: Well nourished, well developed, in no acute distress  ?HEENT: normal  ?Neck: JVP normal  No carotid bruits ?Cardiac: RRR; no murmurs, No LE  edema  ?Respiratory:  clear to auscultation bilaterally ?GI: soft, nontender, nondistended, + BS  No hepatomegaly  ?MS: no deformity Moving all extremities   ?Skin: warm and dry, no rash ?Neuro:  Strength and sensation are intact ?Psych: euthymic mood, full affect ? ? ?EKG:  EKG is t ordered today.  NSR 61 bpm   LVH  Echo:  04/21/20 ? ?1. Left ventricular ejection fraction, by estimation, is 60 to 65%. The left ventricle has normal ?function. The left ventricle has  no regional wall motion abnormalities. Left ventricular ?diastolic parameters are consistent with Grade I diastolic dysfunction (impaired relaxation). ?2. Right ventricular systolic function is normal. The right ventricular size is normal. ?3. The mitral valve is normal in structure. Mild to moderate mitral valve regurgitation. No ?evidence of mitral stenosis. ?4. The aortic valve is tricuspid. Aortic valve regurgitation is not visualized. No aortic stenosis is ?present. ?5. The inferior vena cava is normal in size with greater than 50% respiratory variability, ?suggesting right atrial pressure of 3 mmHg. ?Lipid Panel ?   ?Component Value Date/Time  ? CHOL 140 01/23/2021 1035  ? TRIG 102.0 01/23/2021 1035  ? HDL 46.70 01/23/2021 1035  ? CHOLHDL 3 01/23/2021 1035  ? VLDL 20.4 01/23/2021 1035  ? [place] 73 01/23/2021 1035  ? ?  ? ?Wt Readings from Last 3 Encounters:  ?10/07/21 167 lb 6.4 oz (75.9 kg)  ?07/27/21 173 lb (78.5 kg)  ?06/30/21 170 lb (77.1 kg)  ?  ? ? ?ASSESSMENT AND PLAN: ?1  Paroxysmal atrial flutter  No symptomatic recurrence   ? ?2  HTN  BP is higher in office    BP is a little high agaain   Get BP with pts cuff and office cuff when sees diabetic doctor    ? ?3  HL  Last lipids LDL  73   HDL 47    ? ?4  Mitral regurg   Wil follow clinically for now  No murmur     ? ?F?U in the fall  Sooner for problems   ? ? ?Current medicines are reviewed at length with the patient today.  The patient does not have concerns regarding medicines. ? ?Signed, ?Dorris Carnes, MD  ?10/07/2021 3:42 PM    ?[place] ?[address], [place], [place]  [address] ?Phone: [phone]; Fax: [phone]  ? ? ?

## 2021-10-07 ENCOUNTER — Other Ambulatory Visit: Payer: Self-pay

## 2021-10-07 ENCOUNTER — Encounter: Payer: Self-pay | Admitting: Internal Medicine

## 2021-10-07 ENCOUNTER — Ambulatory Visit (INDEPENDENT_AMBULATORY_CARE_PROVIDER_SITE_OTHER): Payer: HMO | Admitting: Internal Medicine

## 2021-10-07 VITALS — BP 148/68 | HR 61 | Ht 69.0 in | Wt 167.4 lb

## 2021-10-07 DIAGNOSIS — I4892 Unspecified atrial flutter: Secondary | ICD-10-CM

## 2021-10-07 DIAGNOSIS — I509 Heart failure, unspecified: Secondary | ICD-10-CM

## 2021-10-07 NOTE — Patient Instructions (Signed)
Medication Instructions:  ?Your physician recommends that you continue on your current medications as directed. Please refer to the Current Medication list given to you today. ? ?*If you need a refill on your cardiac medications before your next appointment, please call your pharmacy* ? ? ?Lab Work: ?NONE ?If you have labs (blood work) drawn today and your tests are completely normal, you will receive your results only by: ?MyChart Message (if you have MyChart) OR ?A paper copy in the mail ?If you have any lab test that is abnormal or we need to change your treatment, we will call you to review the results. ? ? ?Testing/Procedures: ?NONE ? ? ?Follow-Up: ?At [facility], you and your health needs are our priority.  As part of our continuing mission to provide you with exceptional heart care, we have created designated Provider Care Teams.  These Care Teams include your primary Cardiologist (physician) and Advanced Practice Providers (APPs -  Physician Assistants and Nurse Practitioners) who all work together to provide you with the care you need, when you need it. ? ?We recommend signing up for the patient portal called "MyChart".  Sign up information is provided on this After Visit Summary.  MyChart is used to connect with patients for Virtual Visits (Telemedicine).  Patients are able to view lab/test results, encounter notes, upcoming appointments, etc.  Non-urgent messages can be sent to your provider as well.   ?To learn more about what you can do with MyChart, go to [email or web address].   ? ?Your next appointment:   ?1 year(s) ? ?The format for your next appointment:   ?In Person ? ?Provider:   ?Paula [name], MD   ? ? ?Other Instructions ?  ?

## 2021-10-20 ENCOUNTER — Other Ambulatory Visit (HOSPITAL_BASED_OUTPATIENT_CLINIC_OR_DEPARTMENT_OTHER): Payer: Self-pay

## 2021-10-27 ENCOUNTER — Observation Stay (HOSPITAL_COMMUNITY)
Admission: EM | Admit: 2021-10-27 | Discharge: 2021-10-29 | Disposition: A | Discharge status: Home / Self Care | Payer: HMO | Attending: Family Medicine | Admitting: Family Medicine

## 2021-10-27 ENCOUNTER — Other Ambulatory Visit: Payer: Self-pay

## 2021-10-27 ENCOUNTER — Emergency Department (HOSPITAL_COMMUNITY): Payer: HMO

## 2021-10-27 ENCOUNTER — Telehealth: Payer: Self-pay | Admitting: Family

## 2021-10-27 DIAGNOSIS — I5032 Chronic diastolic (congestive) heart failure: Secondary | ICD-10-CM | POA: Diagnosis present

## 2021-10-27 DIAGNOSIS — I4892 Unspecified atrial flutter: Secondary | ICD-10-CM | POA: Diagnosis not present

## 2021-10-27 DIAGNOSIS — R778 Other specified abnormalities of plasma proteins: Secondary | ICD-10-CM | POA: Diagnosis not present

## 2021-10-27 DIAGNOSIS — E872 Acidosis, unspecified: Secondary | ICD-10-CM

## 2021-10-27 DIAGNOSIS — Z20822 Contact with and (suspected) exposure to covid-19: Secondary | ICD-10-CM | POA: Diagnosis not present

## 2021-10-27 DIAGNOSIS — I110 Hypertensive heart disease with heart failure: Secondary | ICD-10-CM | POA: Diagnosis not present

## 2021-10-27 DIAGNOSIS — K219 Gastro-esophageal reflux disease without esophagitis: Secondary | ICD-10-CM | POA: Insufficient documentation

## 2021-10-27 DIAGNOSIS — N179 Acute kidney failure, unspecified: Secondary | ICD-10-CM | POA: Diagnosis not present

## 2021-10-27 DIAGNOSIS — R2681 Unsteadiness on feet: Secondary | ICD-10-CM | POA: Diagnosis not present

## 2021-10-27 DIAGNOSIS — Z794 Long term (current) use of insulin: Secondary | ICD-10-CM | POA: Diagnosis not present

## 2021-10-27 DIAGNOSIS — E1159 Type 2 diabetes mellitus with other circulatory complications: Secondary | ICD-10-CM | POA: Diagnosis present

## 2021-10-27 DIAGNOSIS — I10 Essential (primary) hypertension: Secondary | ICD-10-CM | POA: Diagnosis present

## 2021-10-27 DIAGNOSIS — J849 Interstitial pulmonary disease, unspecified: Secondary | ICD-10-CM | POA: Diagnosis not present

## 2021-10-27 DIAGNOSIS — E860 Dehydration: Principal | ICD-10-CM

## 2021-10-27 DIAGNOSIS — Z7901 Long term (current) use of anticoagulants: Secondary | ICD-10-CM

## 2021-10-27 DIAGNOSIS — Z79899 Other long term (current) drug therapy: Secondary | ICD-10-CM | POA: Insufficient documentation

## 2021-10-27 DIAGNOSIS — J069 Acute upper respiratory infection, unspecified: Secondary | ICD-10-CM | POA: Diagnosis not present

## 2021-10-27 DIAGNOSIS — E118 Type 2 diabetes mellitus with unspecified complications: Secondary | ICD-10-CM | POA: Diagnosis present

## 2021-10-27 DIAGNOSIS — E1165 Type 2 diabetes mellitus with hyperglycemia: Secondary | ICD-10-CM | POA: Diagnosis not present

## 2021-10-27 DIAGNOSIS — R1110 Vomiting, unspecified: Secondary | ICD-10-CM | POA: Diagnosis present

## 2021-10-27 LAB — RESP PANEL BY RT-PCR (FLU A&B, COVID) ARPGX2
Influenza A by PCR: NEGATIVE
Influenza B by PCR: NEGATIVE
SARS Coronavirus 2 by RT PCR: NEGATIVE

## 2021-10-27 LAB — COMPREHENSIVE METABOLIC PANEL
ALT: 22 U/L (ref 0–44)
AST: 27 U/L (ref 15–41)
Albumin: 3.8 g/dL (ref 3.5–5.0)
Alkaline Phosphatase: 91 U/L (ref 38–126)
Anion gap: 18 — ABNORMAL HIGH (ref 5–15)
BUN: 23 mg/dL (ref 8–23)
CO2: 18 mmol/L — ABNORMAL LOW (ref 22–32)
Calcium: 9.7 mg/dL (ref 8.9–10.3)
Chloride: 99 mmol/L (ref 98–111)
Creatinine, Ser: 1.44 mg/dL — ABNORMAL HIGH (ref 0.61–1.24)
GFR, Estimated: 51 mL/min — ABNORMAL LOW (ref >60)
Glucose, Bld: 333 mg/dL — ABNORMAL HIGH (ref 70–99)
Potassium: 4.9 mmol/L (ref 3.5–5.1)
Sodium: 135 mmol/L (ref 135–145)
Total Bilirubin: 1.9 mg/dL — ABNORMAL HIGH (ref 0.3–1.2)
Total Protein: 7.8 g/dL (ref 6.5–8.1)

## 2021-10-27 LAB — URINALYSIS, ROUTINE W REFLEX MICROSCOPIC
Bacteria, UA: NONE SEEN
Bilirubin Urine: NEGATIVE
Glucose, UA: >=500 mg/dL — AB
Ketones, ur: 80 mg/dL — AB
Leukocytes,Ua: NEGATIVE
Nitrite: NEGATIVE
Protein, ur: NEGATIVE mg/dL
Specific Gravity, Urine: 1.026 (ref 1.005–1.030)
pH: 5 (ref 5.0–8.0)

## 2021-10-27 LAB — BASIC METABOLIC PANEL
Anion gap: 15 (ref 5–15)
BUN: 29 mg/dL — ABNORMAL HIGH (ref 8–23)
CO2: 16 mmol/L — ABNORMAL LOW (ref 22–32)
Calcium: 8.9 mg/dL (ref 8.9–10.3)
Chloride: 100 mmol/L (ref 98–111)
Creatinine, Ser: 1.57 mg/dL — ABNORMAL HIGH (ref 0.61–1.24)
GFR, Estimated: 46 mL/min — ABNORMAL LOW (ref >60)
Glucose, Bld: 375 mg/dL — ABNORMAL HIGH (ref 70–99)
Potassium: 5.1 mmol/L (ref 3.5–5.1)
Sodium: 131 mmol/L — ABNORMAL LOW (ref 135–145)

## 2021-10-27 LAB — CBC WITH DIFFERENTIAL/PLATELET
Abs Immature Granulocytes: 0.03 10*3/uL (ref 0.00–0.07)
Basophils Absolute: 0 10*3/uL (ref 0.0–0.1)
Basophils Relative: 0 %
Eosinophils Absolute: 0 10*3/uL (ref 0.0–0.5)
Eosinophils Relative: 0 %
HCT: 45.8 % (ref 39.0–52.0)
Hemoglobin: 14.6 g/dL (ref 13.0–17.0)
Immature Granulocytes: 0 %
Lymphocytes Relative: 9 %
Lymphs Abs: 1.1 10*3/uL (ref 0.7–4.0)
MCH: 30.2 pg (ref 26.0–34.0)
MCHC: 31.9 g/dL (ref 30.0–36.0)
MCV: 94.8 fL (ref 80.0–100.0)
Monocytes Absolute: 1.4 10*3/uL — ABNORMAL HIGH (ref 0.1–1.0)
Monocytes Relative: 11 %
Neutro Abs: 10.2 10*3/uL — ABNORMAL HIGH (ref 1.7–7.7)
Neutrophils Relative %: 80 %
Platelets: 281 10*3/uL (ref 150–400)
RBC: 4.83 MIL/uL (ref 4.22–5.81)
RDW: 13.1 % (ref 11.5–15.5)
WBC: 12.8 10*3/uL — ABNORMAL HIGH (ref 4.0–10.5)
nRBC: 0 % (ref 0.0–0.2)

## 2021-10-27 LAB — I-STAT VENOUS BLOOD GAS, ED
Acid-base deficit: 4 mmol/L — ABNORMAL HIGH (ref 0.0–2.0)
Bicarbonate: 22.3 mmol/L (ref 20.0–28.0)
Calcium, Ion: 1.25 mmol/L (ref 1.15–1.40)
HCT: 39 % (ref 39.0–52.0)
Hemoglobin: 13.3 g/dL (ref 13.0–17.0)
O2 Saturation: 85 %
Potassium: 5.3 mmol/L — ABNORMAL HIGH (ref 3.5–5.1)
Sodium: 133 mmol/L — ABNORMAL LOW (ref 135–145)
TCO2: 24 mmol/L (ref 22–32)
pCO2, Ven: 44.3 mmHg (ref 44–60)
pH, Ven: 7.309 (ref 7.25–7.43)
pO2, Ven: 54 mmHg — ABNORMAL HIGH (ref 32–45)

## 2021-10-27 LAB — CBG MONITORING, ED: Glucose-Capillary: 295 mg/dL — ABNORMAL HIGH (ref 70–99)

## 2021-10-27 LAB — LIPASE, BLOOD: Lipase: 20 U/L (ref 11–51)

## 2021-10-27 LAB — LACTIC ACID, PLASMA
Lactic Acid, Venous: 2.3 mmol/L (ref 0.5–1.9)
Lactic Acid, Venous: 1.7 mmol/L (ref 0.5–1.9)

## 2021-10-27 LAB — TROPONIN I (HIGH SENSITIVITY)
Troponin I (High Sensitivity): 68 ng/L — ABNORMAL HIGH (ref <18)
Troponin I (High Sensitivity): 61 ng/L — ABNORMAL HIGH (ref <18)

## 2021-10-27 MED ORDER — INSULIN ASPART 100 UNIT/ML IJ SOLN
0.0000 [IU] | Freq: Three times a day (TID) | INTRAMUSCULAR | Status: DC
  Administered 2021-10-28: 2 [IU] via SUBCUTANEOUS
  Administered 2021-10-28: 8 [IU] via SUBCUTANEOUS

## 2021-10-27 MED ORDER — LACTATED RINGERS IV SOLN: INTRAVENOUS | Status: DC

## 2021-10-27 MED ORDER — LACTATED RINGERS IV BOLUS
1000.0000 mL | Freq: Once | INTRAVENOUS | Status: AC
  Administered 2021-10-27: 1000 mL via INTRAVENOUS

## 2021-10-27 MED ORDER — ONDANSETRON HCL 4 MG/2ML IJ SOLN: 4.0000 mg | Freq: Four times a day (QID) | INTRAMUSCULAR | Status: DC | PRN

## 2021-10-27 MED ORDER — ACETAMINOPHEN 650 MG RE SUPP
650.0000 mg | Freq: Four times a day (QID) | RECTAL | Status: DC | PRN
Start: 2021-10-27 — End: 2021-10-29

## 2021-10-27 MED ORDER — ONDANSETRON HCL 4 MG PO TABS: 4.0000 mg | ORAL_TABLET | Freq: Four times a day (QID) | ORAL | Status: DC | PRN

## 2021-10-27 MED ORDER — INSULIN ASPART 100 UNIT/ML IJ SOLN
0.0000 [IU] | Freq: Every day | INTRAMUSCULAR | Status: DC
  Administered 2021-10-28: 5 [IU] via SUBCUTANEOUS

## 2021-10-27 MED ORDER — INSULIN DETEMIR 100 UNIT/ML ~~LOC~~ SOLN
12.0000 [IU] | Freq: Every day | SUBCUTANEOUS | Status: DC
  Administered 2021-10-28 (×2): 12 [IU] via SUBCUTANEOUS
  Filled 2021-10-27 (×3): qty 0.12

## 2021-10-27 MED ORDER — APIXABAN 5 MG PO TABS
5.0000 mg | ORAL_TABLET | Freq: Two times a day (BID) | ORAL | Status: DC
Start: 2021-10-27 — End: 2021-10-29
  Administered 2021-10-28 – 2021-10-29 (×4): 5 mg via ORAL
  Filled 2021-10-27 (×4): qty 1

## 2021-10-27 MED ORDER — METOPROLOL SUCCINATE ER 25 MG PO TB24
25.0000 mg | ORAL_TABLET | Freq: Every day | ORAL | Status: DC
  Administered 2021-10-28: 25 mg via ORAL
  Filled 2021-10-27: qty 1

## 2021-10-27 MED ORDER — ATORVASTATIN CALCIUM 40 MG PO TABS
40.0000 mg | ORAL_TABLET | Freq: Every day | ORAL | Status: DC
  Administered 2021-10-28 (×2): 40 mg via ORAL
  Filled 2021-10-27 (×2): qty 1

## 2021-10-27 MED ORDER — ACETAMINOPHEN 325 MG PO TABS: 650.0000 mg | ORAL_TABLET | Freq: Four times a day (QID) | ORAL | Status: DC | PRN

## 2021-10-27 MED ORDER — SODIUM CHLORIDE 0.9 % IV SOLN
INTRAVENOUS | Status: AC
Start: 2021-10-27 — End: 2021-10-28

## 2021-10-27 NOTE — ED Provider Notes (Signed)
[facility] EMERGENCY DEPARTMENT Provider Note   CSN: [id number] Arrival date & time: 10/27/21  1228     History  Chief Complaint  Patient presents with   Cough   Emesis   Nausea   Shortness of Breath    Ryan [name] is a 76 y.o. male.  Presented to the emergency room with concern for multiple symptoms.  States that over the last few weeks he has had upper respiratory symptoms like cough, congestion, feeling generally unwell.  More recently has been having nausea and some vomiting.  This has been ongoing for last few days.  Then had an episode of diarrhea yesterday.  Most of the symptoms have gotten better.  States that he Noted blood sugar was elevated.  He got some extra insulin prior to arrival.  Additional history was obtained from chart review, Reviewed recent PCP note, cardiology note.  History of atrial flutter, on Eliquis, heart failure, diabetes, hyperlipidemia, hypertension.  HPI     Home Medications Prior to Admission medications   Medication Sig Start Date End Date Taking? Authorizing Provider  apixaban (ELIQUIS) 5 MG TABS tablet TAKE 1 TABLET BY MOUTH TWICE DAILY Patient taking differently: Take 5 mg by mouth 2 (two) times daily. 09/18/21 09/18/22 Yes Fay Records, MD  atorvastatin (LIPITOR) 40 MG tablet TAKE 1 TABLET (40 MG TOTAL) BY MOUTH DAILY. Patient taking differently: Take 40 mg by mouth at bedtime. 08/11/21  Yes [name], NP  blood glucose meter kit and supplies by Other route See admin instructions. Check blood sugar 6-7 times daily   Yes [provider]  Calcium Carbonate-Vitamin D 600-400 MG-UNIT tablet Take 1 tablet by mouth 2 (two) times daily. Reported on 01/27/2016   Yes [provider]  cholecalciferol (VITAMIN D3) 25 MCG (1000 UNIT) tablet Take 1,000 Units by mouth daily.   Yes [provider]  furosemide (LASIX) 20 MG tablet TAKE 1 TABLET (20 MG TOTAL) BY MOUTH DAILY. Patient taking differently:  Take 20 mg by mouth daily. 08/11/21  Yes [name], NP  Glucagon, rDNA, (GLUCAGON EMERGENCY) 1 MG KIT Inject 1 mg into the muscle once as needed for up to 1 dose. Patient taking differently: Inject 1 mg into the muscle as needed (blood sugar below 45). 02/26/21  Yes [name], MD  insulin aspart (NOVOLOG) 100 UNIT/ML injection INJECT 5-6 UNITS INTO THE SKIN 3 TIMES A DAY WITH MEALS Patient taking differently: Inject 0-10 Units into the skin See admin instructions. Per sliding scale 3 times daily 10/30/20  Yes [name], MD  insulin detemir (LEVEMIR) 100 UNIT/ML injection INJECT 0.12 ML (12 UNITS) INTO THE SKIN AT BEDTIME Patient taking differently: Inject 12 Units into the skin at bedtime. 02/26/21  Yes [name], MD  Insulin Syringe-Needle U-100 (TRUEPLUS INSULIN SYRINGE) 31G X 5/16" 0.5 ML MISC USE TO INJECT INSULIN 4 TIMES A DAY 03/18/21  Yes [name], MD  lisinopril (ZESTRIL) 5 MG tablet TAKE 1 TABLET (5 MG TOTAL) BY MOUTH DAILY. Patient taking differently: Take 5 mg by mouth daily. 08/11/21  Yes [name], NP  metoprolol succinate (TOPROL-XL) 25 MG 24 hr tablet TAKE 1 TABLET (25 MG TOTAL) BY MOUTH DAILY. Patient taking differently: Take 25 mg by mouth daily. 08/11/21  Yes [name], NP      Allergies    Other    Review of Systems   Review of Systems  Constitutional:  Positive for chills and fatigue. Negative for fever.  HENT:  Negative for ear pain and sore throat.   Eyes:  Negative for pain and visual disturbance.  Respiratory:  Negative for cough and shortness of breath.   Cardiovascular:  Negative for chest pain and palpitations.  Gastrointestinal:  Positive for diarrhea, nausea and vomiting. Negative for abdominal pain.  Genitourinary:  Negative for dysuria and hematuria.  Musculoskeletal:  Negative for arthralgias and back pain.  Skin:  Negative for color change and rash.  Neurological:  Negative for seizures and syncope.   All other systems reviewed and are negative.  Physical Exam Updated Vital Signs BP 114/63   Pulse 68   Temp 98.3 F (36.8 C) (Oral)   Resp (!) 28   SpO2 100%  Physical Exam Vitals and nursing note reviewed.  Constitutional:      General: He is not in acute distress.    Appearance: He is well-developed.  HENT:     Head: Normocephalic and atraumatic.  Eyes:     Conjunctiva/sclera: Conjunctivae normal.  Cardiovascular:     Rate and Rhythm: Normal rate and regular rhythm.     Heart sounds: No murmur heard. Pulmonary:     Effort: Pulmonary effort is normal. No respiratory distress.     Breath sounds: Normal breath sounds.  Abdominal:     Palpations: Abdomen is soft.     Tenderness: There is no abdominal tenderness.  Musculoskeletal:        General: No swelling.     Cervical back: Neck supple.  Skin:    General: Skin is warm and dry.     Capillary Refill: Capillary refill takes less than 2 seconds.  Neurological:     Mental Status: He is alert.  Psychiatric:        Mood and Affect: Mood normal.    ED Results / Procedures / Treatments   Labs (all labs ordered are listed, but only abnormal results are displayed) Labs Reviewed  CBC WITH DIFFERENTIAL/PLATELET - Abnormal; Notable for the following components:      Result Value   WBC 12.8 (*)    Neutro Abs 10.2 (*)    Monocytes Absolute 1.4 (*)    All other components within normal limits  URINALYSIS, ROUTINE W REFLEX MICROSCOPIC - Abnormal; Notable for the following components:   Glucose, UA >=500 (*)    Hgb urine dipstick SMALL (*)    Ketones, ur 80 (*)    All other components within normal limits  COMPREHENSIVE METABOLIC PANEL - Abnormal; Notable for the following components:   CO2 18 (*)    Glucose, Bld 333 (*)    Creatinine, Ser 1.44 (*)    Total Bilirubin 1.9 (*)    GFR, Estimated 51 (*)    Anion gap 18 (*)    All other components within normal limits  LACTIC ACID, PLASMA - Abnormal; Notable for the  following components:   Lactic Acid, Venous 2.3 (*)    All other components within normal limits  BASIC METABOLIC PANEL - Abnormal; Notable for the following components:   Sodium 131 (*)    CO2 16 (*)    Glucose, Bld 375 (*)    BUN 29 (*)    Creatinine, Ser 1.57 (*)    GFR, Estimated 46 (*)    All other components within normal limits  CBG MONITORING, ED - Abnormal; Notable for the following components:   Glucose-Capillary 295 (*)    All other components within normal limits  CBG MONITORING, ED - Abnormal; Notable for the following components:  Glucose-Capillary 372 (*)    All other components within normal limits  I-STAT VENOUS BLOOD GAS, ED - Abnormal; Notable for the following components:   pO2, Ven 54 (*)    Acid-base deficit 4.0 (*)    Sodium 133 (*)    Potassium 5.3 (*)    All other components within normal limits  TROPONIN I (HIGH SENSITIVITY) - Abnormal; Notable for the following components:   Troponin I (High Sensitivity) 68 (*)    All other components within normal limits  TROPONIN I (HIGH SENSITIVITY) - Abnormal; Notable for the following components:   Troponin I (High Sensitivity) 61 (*)    All other components within normal limits  RESP PANEL BY RT-PCR (FLU A&B, COVID) ARPGX2  CULTURE, BLOOD (ROUTINE X 2)  CULTURE, BLOOD (ROUTINE X 2)  LACTIC ACID, PLASMA  LIPASE, BLOOD  HEMOGLOBIN A1C  MAGNESIUM  CBC WITH DIFFERENTIAL/PLATELET  COMPREHENSIVE METABOLIC PANEL  CBG MONITORING, ED    EKG EKG Interpretation  Date/Time:  Tuesday October 27 2021 12:54:16 EDT Ventricular Rate:  87 PR Interval:  162 QRS Duration: 102 QT Interval:  376 QTC Calculation: 452 R Axis:   -30 Text Interpretation:  Poor data quality, interpretation may be adversely affected Normal sinus rhythm Left axis deviation Moderate voltage criteria for LVH, may be normal variant ( R in aVL , [employer] product ) Nonspecific ST abnormality Abnormal ECG When compared with ECG of 29-Jan-2016 05:51,  PREVIOUS ECG IS PRESENT Confirmed by Madalyn Rob [phone]) on 10/27/2021 8:03:24 PM Radiology DG Chest 2 View  Result Date: 10/27/2021 CLINICAL DATA:  URI symptoms. EXAM: CHEST - 2 VIEW COMPARISON:  Chest x-ray dated April 02, 2020. FINDINGS: The heart size and mediastinal contours are within normal limits. Progressive peripheral and basilar predominant coarse interstitial thickening. No focal consolidation, pleural effusion, or pneumothorax. No acute osseous abnormality. IMPRESSION: 1. Progressive peripheral and basilar predominant coarse interstitial thickening, suspicious for chronic interstitial lung disease. Recommend non-emergent outpatient high-resolution chest CT for further evaluation. Electronically Signed   By: Titus Dubin M.D.   On: 10/27/2021 17:00    Procedures Procedures    Medications Ordered in ED Medications  acetaminophen (TYLENOL) tablet 650 mg (has no administration in time range)    Or  acetaminophen (TYLENOL) suppository 650 mg (has no administration in time range)  ondansetron (ZOFRAN) tablet 4 mg (has no administration in time range)    Or  ondansetron (ZOFRAN) injection 4 mg (has no administration in time range)  0.9 %  sodium chloride infusion (has no administration in time range)  apixaban (ELIQUIS) tablet 5 mg (has no administration in time range)  atorvastatin (LIPITOR) tablet 40 mg (has no administration in time range)  metoprolol succinate (TOPROL-XL) 24 hr tablet 25 mg (has no administration in time range)  insulin detemir (LEVEMIR) injection 12 Units (has no administration in time range)  insulin aspart (novoLOG) injection 0-15 Units (has no administration in time range)  insulin aspart (novoLOG) injection 0-5 Units (has no administration in time range)  lactated ringers bolus 1,000 mL (0 mLs Intravenous Stopped 10/27/21 2150)  lactated ringers bolus 1,000 mL (0 mLs Intravenous Stopped 10/27/21 2250)    ED Course/ Medical Decision Making/ A&P                            Medical Decision Making Amount and/or Complexity of Data Reviewed Labs: ordered.  Risk Decision regarding hospitalization.   76 year old gentleman with history of diabetes,  hypertension, hyperlipidemia, atrial flutter presenting for nausea, diarrhea in the setting of prolonged upper respiratory illness.  Initial BP in ER was stable but subsequent BPs were concerning for hypotension.  When I evaluated patient he was nontoxic in well-appearing with normal mental status.  Lab work was concerning for mild AKI and initially slight elevation in lactic acid.  No infectious symptoms no source of infection identified.  Doubt sepsis as cause. Noted patient was diabetic and was hyperglycemic but pH was normal, not consistent with DKA at present. EKG without ischemic change, initial troponin was mildly elevated but repeat troponin did not have any significant increase, no chest pain, doubt ACS.  For his BP, patient was provided 2 L LR fluid bolus.  Repeat lactate improved, blood pressure improved.  I discussed the case with Dr. Bridgett Larsson who evaluated patient, he advised repeating BMP and if this was not improving that he would advise discharge but if not he was admitted for further management and observation.  Repeat BMP does not show any significant improvement in AKI, hospitalist will therefore admit for overnight observation and further monitoring and rehydration.          Final Clinical Impression(s) / ED Diagnoses Final diagnoses:  AKI (acute kidney injury) ([place])  Lactic acidosis  Dehydration    Rx / DC Orders ED Discharge Orders     None         Lucrezia Starch, MD 10/28/21 0020

## 2021-10-27 NOTE — ED Triage Notes (Signed)
Pt. Stated, I started having all these symptims like runny nose, cough, N/V and SOB. Started about a week and half ?

## 2021-10-27 NOTE — Assessment & Plan Note (Signed)
Patient appears to have been dehydrated.  Blood pressure has responded nicely to IV fluids.  Lactic acid now is normal.  No signs of active infection.  He is afebrile.  Patient states that he feels much better after fluid resuscitation.  He has not had any more diarrhea.  Recommend a repeat BMP after IV fluid resuscitation.  If that shows his serum creatinine is improved and his metabolic acidosis is improving, it would be reasonable to discharge him to home with specific instructions to hold his Lasix until his appetite is back to normal.  Patient can get another BMP in the office on Thursday, March 23 to show resolution of his acute kidney injury. ?

## 2021-10-27 NOTE — H&P (Signed)
Hospitalist Consultation History and Physical    SONIA HACKL [id number] DOB: 07/17/1946 DOA: 10/27/2021  DOS: the patient was seen and examined on 10/27/2021  PCP: [name], NP   Patient coming from: Home  I have personally briefly reviewed patient's old medical records in [place]  CC: hyperglycemia, URI symptoms HPI:  76 year old male history of type 2 diabetes, hypertension, chronic diastolic heart failure, atrial flutter on chronic anticoagulation presents to the hospital today with URI symptoms and hyperglycemia.  Patient states that he has been sick on and off for about 4 months now.  He has periods about 2 to 3 weeks when he gets better with only to get another respiratory infection again.  He lives with his son and daughter and 2 adult grandchildren.  He states that he had diarrhea yesterday.  He denies any fevers.  He states that he checked his blood sugar today and was greater than 400.  He uses insulin at home.  He states that he follows a diabetic diet.  He states that his morning blood sugars when he wakes up is less than 120.  He denies any chest pain.  He states that he has had a nonproductive cough.  He states he only smoked for about 1 year when he was in his early 34s.  He quit more than 50 years ago.  He states that he was in the [employer]  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  On arrival to the ER, temp 98.3 heart rate 92 blood pressure 118/85.  After sitting the ER for about 3 hours, blood pressure dropped to 83/48.  He was given 2 L of lactated Ringer's.  Patient remained afebrile.  Initial labs showed a lactic acid of 2.3 but decreased to 1.7 after IV fluid resuscitation.  White count 12.8, he 114.6 platelets of 281  Sodium 135, chloride 99, bicarb 18, BUN of 23, creatinine 1.44 glucose of 333.  VBG pH is 7.30, PCO2 44  Lipase of 20  Chest x-ray demonstrates  some coarse interstitial thickening.  Radiology recommended not on emergent outpatient high-res CT.  Patient states that he has been taking all of his home medications despite his acute illness and his other viral illnesses over the last 3 to 4 months.  This includes taking his Lasix 20 mg daily along with his lisinopril 5 mg daily.  [facility] hospital[facility] contacted for consultation.   ED Course: Patient given IV fluids due to hypotension.  Initial lactic acidosis has resolved with IV fluids.  Work-up for infection is negative.  Review of Systems:  Review of Systems  Constitutional:  Positive for malaise/fatigue.       Anorexia  HENT:  Positive for congestion.   Eyes: Negative.   Respiratory:  Positive for cough.        Non productive cough  Cardiovascular:  Negative for chest pain.  Gastrointestinal:  Positive for diarrhea. Negative for abdominal pain.       1 episode of diarrhea today  Genitourinary: Negative.  Negative for dysuria.  Musculoskeletal: Negative.   Skin: Negative.   Neurological: Negative.   Endo/Heme/Allergies: Negative.   Psychiatric/Behavioral: Negative.    All other systems reviewed and are negative.  Past Medical History:  Diagnosis Date   Allergy    Diabetes mellitus    GERD (gastroesophageal reflux disease)    History of chicken pox    Hyperlipidemia    Hypertension  Myocardial infarction [facility]) 2017   Type 2 diabetes mellitus with hyperglycemia, with long-term current use of insulin ([place]) 09/08/2015    Past Surgical History:  Procedure Laterality Date   EYE SURGERY  1998   bilateral   SHOULDER SURGERY  1990   right shoulder, torn rotator cuff.     reports that he quit smoking about 56 years ago. His smoking use included cigarettes. He has never used smokeless tobacco. He reports that he does not drink alcohol and does not use drugs.  Allergies  Allergen Reactions   Other Other (See Comments)    Client states he became very sick and had  significant skin inflammation and irritation with the Freestyle Libre sensor    Family History  Problem Relation Age of Onset   Cancer Mother        cancer   Heart disease Mother    Hyperlipidemia Mother    Hypertension Mother    Hyperlipidemia Father    Stroke Father    Hypertension Father    Diabetes Paternal Grandfather     Prior to Admission medications   Medication Sig Start Date End Date Taking? Authorizing Provider  apixaban (ELIQUIS) 5 MG TABS tablet TAKE 1 TABLET BY MOUTH TWICE DAILY Patient taking differently: Take 5 mg by mouth 2 (two) times daily. 09/18/21 09/18/22 Yes Fay Records, MD  atorvastatin (LIPITOR) 40 MG tablet TAKE 1 TABLET (40 MG TOTAL) BY MOUTH DAILY. Patient taking differently: Take 40 mg by mouth at bedtime. 08/11/21  Yes [name], NP  blood glucose meter kit and supplies by Other route See admin instructions. Check blood sugar 6-7 times daily   Yes [provider]  Calcium Carbonate-Vitamin D 600-400 MG-UNIT tablet Take 1 tablet by mouth 2 (two) times daily. Reported on 01/27/2016   Yes [provider]  cholecalciferol (VITAMIN D3) 25 MCG (1000 UNIT) tablet Take 1,000 Units by mouth daily.   Yes [provider]  furosemide (LASIX) 20 MG tablet TAKE 1 TABLET (20 MG TOTAL) BY MOUTH DAILY. Patient taking differently: Take 20 mg by mouth daily. 08/11/21  Yes [name], NP  Glucagon, rDNA, (GLUCAGON EMERGENCY) 1 MG KIT Inject 1 mg into the muscle once as needed for up to 1 dose. Patient taking differently: Inject 1 mg into the muscle as needed (blood sugar below 45). 02/26/21  Yes [name], MD  insulin aspart (NOVOLOG) 100 UNIT/ML injection INJECT 5-6 UNITS INTO THE SKIN 3 TIMES A DAY WITH MEALS Patient taking differently: Inject 0-10 Units into the skin See admin instructions. Per sliding scale 3 times daily 10/30/20  Yes [name], MD  insulin detemir (LEVEMIR) 100 UNIT/ML injection INJECT 0.12 ML (12  UNITS) INTO THE SKIN AT BEDTIME Patient taking differently: Inject 12 Units into the skin at bedtime. 02/26/21  Yes [name], MD  Insulin Syringe-Needle U-100 (TRUEPLUS INSULIN SYRINGE) 31G X 5/16" 0.5 ML MISC USE TO INJECT INSULIN 4 TIMES A DAY 03/18/21  Yes [name], MD  lisinopril (ZESTRIL) 5 MG tablet TAKE 1 TABLET (5 MG TOTAL) BY MOUTH DAILY. Patient taking differently: Take 5 mg by mouth daily. 08/11/21  Yes [name], NP  metoprolol succinate (TOPROL-XL) 25 MG 24 hr tablet TAKE 1 TABLET (25 MG TOTAL) BY MOUTH DAILY. Patient taking differently: Take 25 mg by mouth daily. 08/11/21  Yes [name], NP    Physical Exam: Vitals:   10/27/21 2030 10/27/21 2045 10/27/21 2200 10/27/21 2300  BP: 92/62 (!) 97/57 (!) 110/58 114/63  Pulse: 70 67 77 68  Resp: (!) 33 (!) 22 (!) 25 (!) 28  Temp:      TempSrc:      SpO2: 100% 100% 98% 100%    Physical Exam Vitals and nursing note reviewed.  Constitutional:      General: He is not in acute distress.    Appearance: Normal appearance. He is normal weight. He is not ill-appearing, toxic-appearing or diaphoretic.  HENT:     Head: Normocephalic and atraumatic.     Nose: Nose normal. No rhinorrhea.  Cardiovascular:     Rate and Rhythm: Normal rate and regular rhythm.     Pulses: Normal pulses.  Pulmonary:     Effort: Pulmonary effort is normal. No respiratory distress.     Breath sounds: No wheezing or rales.  Abdominal:     General: Abdomen is flat. Bowel sounds are normal. There is no distension.     Palpations: Abdomen is soft.     Tenderness: There is no abdominal tenderness. There is no guarding or rebound.  Musculoskeletal:     Right lower leg: No edema.     Left lower leg: No edema.  Skin:    General: Skin is warm and dry.     Capillary Refill: Capillary refill takes less than 2 seconds.  Neurological:     General: No focal deficit present.     Mental Status: He is alert and oriented to person,  place, and time.     Labs on Admission: I have personally reviewed following labs and imaging studies  CBC: Recent Labs  Lab 10/27/21 1402 10/27/21 1936  WBC 12.8*  --   NEUTROABS 10.2*  --   HGB 14.6 13.3  HCT 45.8 39.0  MCV 94.8  --   PLT 281  --    Basic Metabolic Panel: Recent Labs  Lab 10/27/21 1402 10/27/21 1936  NA 135 133*  K 4.9 5.3*  CL 99  --   CO2 18*  --   GLUCOSE 333*  --   BUN 23  --   CREATININE 1.44*  --   CALCIUM 9.7  --    GFR: CrCl cannot be calculated (Unknown ideal weight.). Liver Function Tests: Recent Labs  Lab 10/27/21 1402  AST 27  ALT 22  ALKPHOS 91  BILITOT 1.9*  PROT 7.8  ALBUMIN 3.8   Recent Labs  Lab 10/27/21 1848  LIPASE 20   No results for input(s): AMMONIA in the last 168 hours. Coagulation Profile: No results for input(s): INR, PROTIME in the last 168 hours. Cardiac Enzymes: Recent Labs  Lab 10/27/21 1848 10/27/21 2021  TROPONINIHS 68* 61*   BNP (last 3 results) No results for input(s): PROBNP in the last 8760 hours. HbA1C: No results for input(s): HGBA1C in the last 72 hours. CBG: Recent Labs  Lab 10/27/21 1805  GLUCAP 295*   Lipid Profile: No results for input(s): CHOL, HDL, LDLCALC, TRIG, CHOLHDL, LDLDIRECT in the last 72 hours. Thyroid Function Tests: No results for input(s): TSH, T4TOTAL, FREET4, T3FREE, THYROIDAB in the last 72 hours. Anemia Panel: No results for input(s): VITAMINB12, FOLATE, FERRITIN, TIBC, IRON, RETICCTPCT in the last 72 hours. Urine analysis:    Component Value Date/Time   COLORURINE YELLOW 10/27/2021 1452   APPEARANCEUR CLEAR 10/27/2021 1452   LABSPEC 1.026 10/27/2021 1452   PHURINE 5.0 10/27/2021 1452   GLUCOSEU >=500 (A) 10/27/2021 1452   GLUCOSEU NEGATIVE 08/10/2017 1219   HGBUR SMALL (A) 10/27/2021 1452   BILIRUBINUR NEGATIVE  10/27/2021 1452   KETONESUR 80 (A) 10/27/2021 1452   PROTEINUR NEGATIVE 10/27/2021 1452   UROBILINOGEN 0.2 08/10/2017 1219   NITRITE  NEGATIVE 10/27/2021 1452   LEUKOCYTESUR NEGATIVE 10/27/2021 1452    Radiological Exams on Admission: I have personally reviewed images DG Chest 2 View  Result Date: 10/27/2021 CLINICAL DATA:  URI symptoms. EXAM: CHEST - 2 VIEW COMPARISON:  Chest x-ray dated April 02, 2020. FINDINGS: The heart size and mediastinal contours are within normal limits. Progressive peripheral and basilar predominant coarse interstitial thickening. No focal consolidation, pleural effusion, or pneumothorax. No acute osseous abnormality. IMPRESSION: 1. Progressive peripheral and basilar predominant coarse interstitial thickening, suspicious for chronic interstitial lung disease. Recommend non-emergent outpatient high-resolution chest CT for further evaluation. Electronically Signed   By: Titus Dubin M.D.   On: 10/27/2021 17:00    EKG: I have personally reviewed EKG: NSR   Assessment/Plan Principal Problem:   Dehydration Active Problems:   HTN (hypertension)   Poorly controlled type 2 diabetes mellitus with circulatory disorder (HCC)   Atrial flutter (HCC)   Chronic diastolic CHF (congestive heart failure) (HCC)   Chronic anticoagulation    Assessment and Plan: * Dehydration/AKI Patient appears to have been dehydrated.  Blood pressure has responded nicely to IV fluids.  Lactic acid now is normal.  No signs of active infection.  He is afebrile.  Patient states that he feels much better after fluid resuscitation.  He has not had any more diarrhea.  Recommend a repeat BMP after IV fluid resuscitation.  If that shows his serum creatinine is improved and his metabolic acidosis is improving, it would be reasonable to discharge him to home with specific instructions to hold his Lasix until his appetite is back to normal.  Patient can get another BMP in the office on Thursday, March 23 to show resolution of his acute kidney injury.  Chronic anticoagulation Continue Eliquis.  Chronic diastolic CHF (congestive heart  failure) ([place]) Patient was actually dehydrated.  No evidence of acute heart failure.  Atrial flutter ([place]) Patient in normal sinus rhythm here.  He is on Toprol-XL.  He is on Eliquis for CVA prophylaxis.  Poorly controlled type 2 diabetes mellitus with circulatory disorder (HCC) Check A1c.  Recent blood sugars seem uncontrolled.  Patient states that he is taking Levemir and NovoLog insulin at home.  HTN (hypertension) Patient has been taking his Lasix and his lisinopril despite feeling under the weather and having anorexia.  Discussed with the patient that he should hold his Lasix and his lisinopril when his appetite is poor as both these medications can cause acute kidney injury in the face of dehydration.  Continue his Toprol-XL as long as his systolic blood pressure is greater than 120.    Code Status: Full Code Family Communication: no family at bedside  Admission status: observation medical bed DVT prophylaxis with Eliquis Disposition: return home.  Kristopher Oppenheim, DO [facility] 10/27/2021, 11:31 PM

## 2021-10-27 NOTE — Consult Note (Signed)
Hospitalist Consultation History and Physical    BRYLYN MABERY [id number] DOB: [birth year] DOA: 10/27/2021  DOS: the patient was seen and examined on 10/27/2021  PCP: [name], NP   Patient coming from: Home  I have personally briefly reviewed patient's old medical records in [place]  CC: hyperglycemia, URI symptoms HPI:  76 year old male history of type 2 diabetes, hypertension, chronic diastolic heart failure, atrial flutter on chronic anticoagulation presents to the hospital today with URI symptoms and hyperglycemia.  Patient states that he has been sick on and off for about 4 months now.  He has periods about 2 to 3 weeks when he gets better with only to get another respiratory infection again.  He lives with his son and daughter and 2 adult grandchildren.  He states that he had diarrhea yesterday.  He denies any fevers.  He states that he checked his blood sugar today and was greater than 400.  He uses insulin at home.  He states that he follows a diabetic diet.  He states that his morning blood sugars when he wakes up is less than 120.  He denies any chest pain.  He states that he has had a nonproductive cough.  He states he only smoked for about 1 year when he was in his early 64s.  He quit more than 50 years ago.  He states that he was in the [employer].  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  On arrival to the ER, temp 98.3 heart rate 92 blood pressure 118/85.  After sitting the ER for about 3 hours, blood pressure dropped to 83/48.  He was given 2 L of lactated Ringer's.  Patient remained afebrile.  Initial labs showed a lactic acid of 2.3 but decreased to 1.7 after IV fluid resuscitation.  White count 12.8, he 114.6 platelets of 281  Sodium 135, chloride 99, bicarb 18, BUN of 23, creatinine 1.44 glucose of 333.  VBG pH is 7.30, PCO2 44  Lipase of 20  Chest x-ray demonstrates  some coarse interstitial thickening.  Radiology recommended not on emergent outpatient high-res CT.  Patient states that he has been taking all of his home medications despite his acute illness and his other viral illnesses over the last 3 to 4 months.  This includes taking his Lasix 20 mg daily along with his lisinopril 5 mg daily.  [facility] hospitalist contacted for consultation.   ED Course: Patient given IV fluids due to hypotension.  Initial lactic acidosis has resolved with IV fluids.  Work-up for infection is negative.  Review of Systems:  Review of Systems  Constitutional:  Positive for malaise/fatigue.       Anorexia  HENT:  Positive for congestion.   Eyes: Negative.   Respiratory:  Positive for cough.        Non productive cough  Cardiovascular:  Negative for chest pain.  Gastrointestinal:  Positive for diarrhea. Negative for abdominal pain.       1 episode of diarrhea today  Genitourinary: Negative.  Negative for dysuria.  Musculoskeletal: Negative.   Skin: Negative.   Neurological: Negative.   Endo/Heme/Allergies: Negative.   Psychiatric/Behavioral: Negative.    All other systems reviewed and are negative.  Past Medical History:  Diagnosis Date   Allergy    Diabetes mellitus    GERD (gastroesophageal reflux disease)    History of chicken pox    Hyperlipidemia    Hypertension  Myocardial infarction [facility]) 2017   Type 2 diabetes mellitus with hyperglycemia, with long-term current use of insulin ([place]) 09/08/2015    Past Surgical History:  Procedure Laterality Date   EYE SURGERY  1998   bilateral   SHOULDER SURGERY  1990   right shoulder, torn rotator cuff.     reports that he quit smoking about 56 years ago. His smoking use included cigarettes. He has never used smokeless tobacco. He reports that he does not drink alcohol and does not use drugs.  Allergies  Allergen Reactions   Other Other (See Comments)    Client states he became very sick and had  significant skin inflammation and irritation with the Freestyle Libre sensor    Family History  Problem Relation Age of Onset   Cancer Mother        cancer   Heart disease Mother    Hyperlipidemia Mother    Hypertension Mother    Hyperlipidemia Father    Stroke Father    Hypertension Father    Diabetes Paternal Grandfather     Prior to Admission medications   Medication Sig Start Date End Date Taking? Authorizing Provider  apixaban (ELIQUIS) 5 MG TABS tablet TAKE 1 TABLET BY MOUTH TWICE DAILY Patient taking differently: Take 5 mg by mouth 2 (two) times daily. 09/18/21 09/18/22 Yes Fay Records, MD  atorvastatin (LIPITOR) 40 MG tablet TAKE 1 TABLET (40 MG TOTAL) BY MOUTH DAILY. Patient taking differently: Take 40 mg by mouth at bedtime. 08/11/21  Yes [name], NP  blood glucose meter kit and supplies by Other route See admin instructions. Check blood sugar 6-7 times daily   Yes [provider]  Calcium Carbonate-Vitamin D 600-400 MG-UNIT tablet Take 1 tablet by mouth 2 (two) times daily. Reported on 01/27/2016   Yes [provider]  cholecalciferol (VITAMIN D3) 25 MCG (1000 UNIT) tablet Take 1,000 Units by mouth daily.   Yes [provider]  furosemide (LASIX) 20 MG tablet TAKE 1 TABLET (20 MG TOTAL) BY MOUTH DAILY. Patient taking differently: Take 20 mg by mouth daily. 08/11/21  Yes [name], NP  Glucagon, rDNA, (GLUCAGON EMERGENCY) 1 MG KIT Inject 1 mg into the muscle once as needed for up to 1 dose. Patient taking differently: Inject 1 mg into the muscle as needed (blood sugar below 45). 02/26/21  Yes [name], MD  insulin aspart (NOVOLOG) 100 UNIT/ML injection INJECT 5-6 UNITS INTO THE SKIN 3 TIMES A DAY WITH MEALS Patient taking differently: Inject 0-10 Units into the skin See admin instructions. Per sliding scale 3 times daily 10/30/20  Yes [name], MD  insulin detemir (LEVEMIR) 100 UNIT/ML injection INJECT 0.12 ML (12  UNITS) INTO THE SKIN AT BEDTIME Patient taking differently: Inject 12 Units into the skin at bedtime. 02/26/21  Yes [name], MD  Insulin Syringe-Needle U-100 (TRUEPLUS INSULIN SYRINGE) 31G X 5/16" 0.5 ML MISC USE TO INJECT INSULIN 4 TIMES A DAY 03/18/21  Yes [name], MD  lisinopril (ZESTRIL) 5 MG tablet TAKE 1 TABLET (5 MG TOTAL) BY MOUTH DAILY. Patient taking differently: Take 5 mg by mouth daily. 08/11/21  Yes [name], NP  metoprolol succinate (TOPROL-XL) 25 MG 24 hr tablet TAKE 1 TABLET (25 MG TOTAL) BY MOUTH DAILY. Patient taking differently: Take 25 mg by mouth daily. 08/11/21  Yes [name], NP    Physical Exam: Vitals:   10/27/21 2030 10/27/21 2045 10/27/21 2200 10/27/21 2300  BP: 92/62 (!) 97/57 (!) 110/58 114/63  Pulse: 70 67 77 68  Resp: (!) 33 (!) 22 (!) 25 (!) 28  Temp:      TempSrc:      SpO2: 100% 100% 98% 100%    Physical Exam Vitals and nursing note reviewed.  Constitutional:      General: He is not in acute distress.    Appearance: Normal appearance. He is normal weight. He is not ill-appearing, toxic-appearing or diaphoretic.  HENT:     Head: Normocephalic and atraumatic.     Nose: Nose normal. No rhinorrhea.  Cardiovascular:     Rate and Rhythm: Normal rate and regular rhythm.     Pulses: Normal pulses.  Pulmonary:     Effort: Pulmonary effort is normal. No respiratory distress.     Breath sounds: No wheezing or rales.  Abdominal:     General: Abdomen is flat. Bowel sounds are normal. There is no distension.     Palpations: Abdomen is soft.     Tenderness: There is no abdominal tenderness. There is no guarding or rebound.  Musculoskeletal:     Right lower leg: No edema.     Left lower leg: No edema.  Skin:    General: Skin is warm and dry.     Capillary Refill: Capillary refill takes less than 2 seconds.  Neurological:     General: No focal deficit present.     Mental Status: He is alert and oriented to person,  place, and time.     Labs on Admission: I have personally reviewed following labs and imaging studies  CBC: Recent Labs  Lab 10/27/21 1402 10/27/21 1936  WBC 12.8*  --   NEUTROABS 10.2*  --   HGB 14.6 13.3  HCT 45.8 39.0  MCV 94.8  --   PLT 281  --    Basic Metabolic Panel: Recent Labs  Lab 10/27/21 1402 10/27/21 1936  NA 135 133*  K 4.9 5.3*  CL 99  --   CO2 18*  --   GLUCOSE 333*  --   BUN 23  --   CREATININE 1.44*  --   CALCIUM 9.7  --    GFR: CrCl cannot be calculated (Unknown ideal weight.). Liver Function Tests: Recent Labs  Lab 10/27/21 1402  AST 27  ALT 22  ALKPHOS 91  BILITOT 1.9*  PROT 7.8  ALBUMIN 3.8   Recent Labs  Lab 10/27/21 1848  LIPASE 20   No results for input(s): AMMONIA in the last 168 hours. Coagulation Profile: No results for input(s): INR, PROTIME in the last 168 hours. Cardiac Enzymes: Recent Labs  Lab 10/27/21 1848 10/27/21 2021  TROPONINIHS 68* 61*   BNP (last 3 results) No results for input(s): PROBNP in the last 8760 hours. HbA1C: No results for input(s): HGBA1C in the last 72 hours. CBG: Recent Labs  Lab 10/27/21 1805  GLUCAP 295*   Lipid Profile: No results for input(s): CHOL, HDL, LDLCALC, TRIG, CHOLHDL, LDLDIRECT in the last 72 hours. Thyroid Function Tests: No results for input(s): TSH, T4TOTAL, FREET4, T3FREE, THYROIDAB in the last 72 hours. Anemia Panel: No results for input(s): VITAMINB12, FOLATE, FERRITIN, TIBC, IRON, RETICCTPCT in the last 72 hours. Urine analysis:    Component Value Date/Time   COLORURINE YELLOW 10/27/2021 1452   APPEARANCEUR CLEAR 10/27/2021 1452   LABSPEC 1.026 10/27/2021 1452   PHURINE 5.0 10/27/2021 1452   GLUCOSEU >=500 (A) 10/27/2021 1452   GLUCOSEU NEGATIVE 08/10/2017 1219   HGBUR SMALL (A) 10/27/2021 1452   BILIRUBINUR NEGATIVE  10/27/2021 1452   KETONESUR 80 (A) 10/27/2021 1452   PROTEINUR NEGATIVE 10/27/2021 1452   UROBILINOGEN 0.2 08/10/2017 1219   NITRITE  NEGATIVE 10/27/2021 1452   LEUKOCYTESUR NEGATIVE 10/27/2021 1452    Radiological Exams on Admission: I have personally reviewed images DG Chest 2 View  Result Date: 10/27/2021 CLINICAL DATA:  URI symptoms. EXAM: CHEST - 2 VIEW COMPARISON:  Chest x-ray dated April 02, 2020. FINDINGS: The heart size and mediastinal contours are within normal limits. Progressive peripheral and basilar predominant coarse interstitial thickening. No focal consolidation, pleural effusion, or pneumothorax. No acute osseous abnormality. IMPRESSION: 1. Progressive peripheral and basilar predominant coarse interstitial thickening, suspicious for chronic interstitial lung disease. Recommend non-emergent outpatient high-resolution chest CT for further evaluation. Electronically Signed   By: Titus Dubin M.D.   On: 10/27/2021 17:00    EKG: I have personally reviewed EKG: NSR   Assessment/Plan Principal Problem:   Dehydration Active Problems:   HTN (hypertension)   Poorly controlled type 2 diabetes mellitus with circulatory disorder (HCC)   Atrial flutter (HCC)   Chronic diastolic CHF (congestive heart failure) (HCC)   Chronic anticoagulation    Assessment and Plan: * Dehydration Patient appears to have been dehydrated.  Blood pressure has responded nicely to IV fluids.  Lactic acid now is normal.  No signs of active infection.  He is afebrile.  Patient states that he feels much better after fluid resuscitation.  He has not had any more diarrhea.  Recommend a repeat BMP after IV fluid resuscitation.  If that shows his serum creatinine is improved and his metabolic acidosis is improving, it would be reasonable to discharge him to home with specific instructions to hold his Lasix until his appetite is back to normal.  Patient can get another BMP in the office on Thursday, March 23 to show resolution of his acute kidney injury.  Chronic anticoagulation Continue Eliquis.  Chronic diastolic CHF (congestive heart  failure) ([place]) Patient was actually dehydrated.  No evidence of acute heart failure.  Atrial flutter ([place]) Patient in normal sinus rhythm here.  He is on Toprol-XL.  He is on Eliquis for CVA prophylaxis.  Poorly controlled type 2 diabetes mellitus with circulatory disorder (HCC) Check A1c.  Recent blood sugars seem uncontrolled.  Patient states that he is taking Levemir and NovoLog insulin at home.  HTN (hypertension) Patient has been taking his Lasix and his lisinopril despite feeling under the weather and having anorexia.  Discussed with the patient that he should hold his Lasix and his lisinopril when his appetite is poor as both these medications can cause acute kidney injury in the face of dehydration.  Continue his Toprol-XL as long as his systolic blood pressure is greater than 120.    Code Status: Full Code Family Communication: no family at bedside  Admission status: undetermined. Awaiting repeat labs.  Kristopher Oppenheim, DO [facility] 10/27/2021, 11:31 PM

## 2021-10-27 NOTE — Telephone Encounter (Signed)
Nurse Assessment ?Nurse: Nunzio Cory, RN, Sherrie Date/Time Lamount Cohen Time): 10/27/2021 11:06:12 AM ?Confirm and document reason for call. If ?symptomatic, describe symptoms. ?---Caller states father is diabetic, blood sugars running ?high at 300 and even up to 500. States has been sick ?for the last 4 months: +cough, +nasal congestion, No ?fevers. Also feels like something is sitting on chest. ?+fatigue, +SOB at times mostly when lies down. ?+fluids, +UOP in last 8 hrs. ?Does the patient have any new or worsening ?symptoms? ---Yes ?Will a triage be completed? ---Yes ?Related visit to physician within the last 2 weeks? ---No ?Does the PT have any chronic conditions? (i.e. ?diabetes, asthma, this includes High risk factors for ?pregnancy, etc.) ?---Yes ?List chronic conditions. ---+Type 2 Diabetic - insulin, HTN, MI in 2017. ?Is this a behavioral health or substance abuse call? ---No ?Guidelines ?Guideline Title Affirmed Question Affirmed Notes Nurse Date/Time (Eastern ?Time) ?Chest Pain [1] Chest pain lasts ?> 5 minutes AND ?[2] occurred in past ?Annamaria Helling, Charlton Amor 10/27/2021 11:14:00 ?AM ?PLEASE NOTE: All timestamps contained within this report are represented as [place] Standard Time. ?CONFIDENTIALTY NOTICE: This fax transmission is intended only for the addressee. It contains information that is legally privileged, confidential or ?otherwise protected from use or disclosure. If you are not the intended recipient, you are strictly prohibited from reviewing, disclosing, copying using ?or disseminating any of this information or taking any action in reliance on or regarding this information. If you have received this fax in error, please ?notify us immediately by telephone so that we can arrange for its return to [place]. Phone: [phone], Toll-Free: [phone], Fax: [phone] ?Page: 2 of 2 ?Call Id: [id number] ?Guidelines ?Guideline Title Affirmed Question Affirmed Notes Nurse Date/Time (Eastern ?Time) ?3 days (72  hours) ?(Exception: feels ?exactly the same as ?previously diagnosed ?heartburn and has ?accompanying sour ?taste in mouth) ?Disp. Time (Eastern ?Time) Disposition Final User ?10/27/2021 11:03:46 AM Send to Urgent Queue Gasper Sells ?10/27/2021 11:24:41 AM Go to ED Now (or PCP triage) Yes Nunzio Cory, RN, Sherrie ?Caller Disagree/Comply Comply ?Caller Understands Yes ?PreDisposition InappropriateToAsk ?Care Advice Given Per Guideline ?GO TO ED NOW (OR PCP TRIAGE): * IF NO PCP (PRIMARY CARE PROVIDER) SECOND-LEVEL TRIAGE: You need to ?be seen within the next hour. Go to the ED/UCC at _____________ Hospital. Leave as soon as you can. CALL EMS IF: * Severe ?difficulty breathing occurs * Passes out or becomes too weak to stand ?Comments ?User: Holland Commons, RN Date/Time Lamount Cohen Time): 10/27/2021 11:25:42 AM ?Called backline of office and spoke with Tiarra, no appts available. Will get message to PCP about going to ED. ?Caller willing to go to ED to be seen. ?Referrals ?[facility] - ED ?

## 2021-10-27 NOTE — Assessment & Plan Note (Signed)
See plan for dehydration. Repeat bmp in AM. ?

## 2021-10-27 NOTE — Assessment & Plan Note (Signed)
Check A1c.  Recent blood sugars seem uncontrolled.  Patient states that he is taking Levemir and NovoLog insulin at home. ?

## 2021-10-27 NOTE — Telephone Encounter (Signed)
Noted. Pt is in the ED 

## 2021-10-27 NOTE — Assessment & Plan Note (Signed)
Patient in normal sinus rhythm here.  He is on Toprol-XL.  He is on Eliquis for CVA prophylaxis. ?

## 2021-10-27 NOTE — Subjective & Objective (Signed)
CC: hyperglycemia, URI symptoms ?HPI: ? ?76 year old male history of type 2 diabetes, hypertension, chronic diastolic heart failure, atrial flutter on chronic anticoagulation presents to the hospital today with URI symptoms and hyperglycemia.  Patient states that he has been sick on and off for about 4 months now.  He has periods about 2 to 3 weeks when he gets better with only to get another respiratory infection again.  He lives with his son and daughter and 2 adult grandchildren.  He states that he had diarrhea yesterday.  He denies any fevers.  He states that he checked his blood sugar today and was greater than 400.  He uses insulin at home.  He states that he follows a diabetic diet.  He states that his morning blood sugars when he wakes up is less than 120.  He denies any chest pain.  He states that he has had a nonproductive cough.  He states he only smoked for about 1 year when he was in his early 2s.  He quit more than 50 years ago.  He states that he was in the [employer].  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 ? ?On arrival to the ER, temp 98.3 heart rate 92 blood pressure 118/85. ? ?After sitting the ER for about 3 hours, blood pressure dropped to 83/48. ? ?He was given 2 L of lactated Ringer's. ? ?Patient remained afebrile. ? ?Initial labs showed a lactic acid of 2.3 but decreased to 1.7 after IV fluid resuscitation. ? ?White count 12.8, he 114.6 platelets of 281 ? ?Sodium 135, chloride 99, bicarb 18, BUN of 23, creatinine 1.44 glucose of 333. ? ?VBG pH is 7.30, PCO2 44 ? ?Lipase of 20 ? ?Chest x-ray demonstrates some coarse interstitial thickening.  Radiology recommended not on emergent outpatient high-res CT. ? ?Patient states that he has been taking all of his home medications despite his acute illness and his other viral illnesses over the last 3 to 4 months.  This includes taking his Lasix 20 mg daily along with his  lisinopril 5 mg daily. ? ?[facility] hospitalist contacted for consultation. ?

## 2021-10-27 NOTE — Telephone Encounter (Signed)
Pt's son stated pt symps are similar to his heart attack he had previously. Pt's sugar was 500 but was able to get it down to 350, coughing, and high blood pressure  ? ?Transferred to triage  ?  ?

## 2021-10-27 NOTE — ED Provider Triage Note (Signed)
Emergency Medicine Provider Triage Evaluation Note ? ?Ryan [name] , a 76 y.o. male  was evaluated in triage.  Patient states that he has been sick with URI symptoms for the past few weeks, began having nausea, vomiting, and diarrhea yesterday which has since subsided.  Checked his blood sugar prior to arrival and noticed that it was over 500.  He subsequently took some insulin and came here for further evaluation. ? ?Review of Systems  ?Positive: Hyperglycemia ?Negative: Abdominal pain, fevers, chills ? ?Physical Exam  ?BP 118/85 (BP Location: Right Arm)   Pulse 92   Temp 98.3 ?F (36.8 ?C) (Oral)   Resp 20   SpO2 96%  ?Gen:   Awake, no distress   ?Resp:  Normal effort  ?MSK:   Moves extremities without difficulty  ?Other:   ? ?Medical Decision Making  ?Medically screening exam initiated at 1:43 PM.  Appropriate orders placed.  Ryan [name] was informed that the remainder of the evaluation will be completed by another provider, this initial triage assessment does not replace that evaluation, and the importance of remaining in the ED until their evaluation is complete. ? ? ?  ?Silva Bandy, PA-C ?10/27/21 1345 ? ?

## 2021-10-27 NOTE — Assessment & Plan Note (Signed)
-   Continue Eliquis 

## 2021-10-27 NOTE — Telephone Encounter (Signed)
Update: Cordelia Pen- Triage RN ? ?Pt has been feeling sick x about 4 months sxs including- chest pressure/ congestion and sob when even sitting. Pts blood sugars have also been elevated into the 300s- 500's.  ? ?Both Sherry and I agree that the pt should go to the ER.  ? ?

## 2021-10-27 NOTE — Assessment & Plan Note (Signed)
Patient was actually dehydrated.  No evidence of acute heart failure. ?

## 2021-10-27 NOTE — Assessment & Plan Note (Signed)
Patient has been taking his Lasix and his lisinopril despite feeling under the weather and having anorexia.  Discussed with the patient that he should hold his Lasix and his lisinopril when his appetite is poor as both these medications can cause acute kidney injury in the face of dehydration.  Continue his Toprol-XL as long as his systolic blood pressure is greater than 120. ?

## 2021-10-28 DIAGNOSIS — E860 Dehydration: Secondary | ICD-10-CM | POA: Diagnosis not present

## 2021-10-28 LAB — HEMOGLOBIN A1C
Hgb A1c MFr Bld: 7.2 % — ABNORMAL HIGH (ref 4.8–5.6)
Mean Plasma Glucose: 159.94 mg/dL

## 2021-10-28 LAB — CBC WITH DIFFERENTIAL/PLATELET
Abs Immature Granulocytes: 0.04 10*3/uL (ref 0.00–0.07)
Basophils Absolute: 0 10*3/uL (ref 0.0–0.1)
Basophils Relative: 0 %
Eosinophils Absolute: 0 10*3/uL (ref 0.0–0.5)
Eosinophils Relative: 0 %
HCT: 36.9 % — ABNORMAL LOW (ref 39.0–52.0)
Hemoglobin: 11.8 g/dL — ABNORMAL LOW (ref 13.0–17.0)
Immature Granulocytes: 0 %
Lymphocytes Relative: 14 %
Lymphs Abs: 1.7 10*3/uL (ref 0.7–4.0)
MCH: 29.8 pg (ref 26.0–34.0)
MCHC: 32 g/dL (ref 30.0–36.0)
MCV: 93.2 fL (ref 80.0–100.0)
Monocytes Absolute: 1.4 10*3/uL — ABNORMAL HIGH (ref 0.1–1.0)
Monocytes Relative: 12 %
Neutro Abs: 8.6 10*3/uL — ABNORMAL HIGH (ref 1.7–7.7)
Neutrophils Relative %: 74 %
Platelets: 235 10*3/uL (ref 150–400)
RBC: 3.96 MIL/uL — ABNORMAL LOW (ref 4.22–5.81)
RDW: 13.1 % (ref 11.5–15.5)
WBC: 11.8 10*3/uL — ABNORMAL HIGH (ref 4.0–10.5)
nRBC: 0 % (ref 0.0–0.2)

## 2021-10-28 LAB — COMPREHENSIVE METABOLIC PANEL
ALT: 18 U/L (ref 0–44)
AST: 20 U/L (ref 15–41)
Albumin: 3 g/dL — ABNORMAL LOW (ref 3.5–5.0)
Alkaline Phosphatase: 71 U/L (ref 38–126)
Anion gap: 13 (ref 5–15)
BUN: 27 mg/dL — ABNORMAL HIGH (ref 8–23)
CO2: 16 mmol/L — ABNORMAL LOW (ref 22–32)
Calcium: 8.8 mg/dL — ABNORMAL LOW (ref 8.9–10.3)
Chloride: 104 mmol/L (ref 98–111)
Creatinine, Ser: 1.37 mg/dL — ABNORMAL HIGH (ref 0.61–1.24)
GFR, Estimated: 54 mL/min — ABNORMAL LOW (ref >60)
Glucose, Bld: 264 mg/dL — ABNORMAL HIGH (ref 70–99)
Potassium: 4.6 mmol/L (ref 3.5–5.1)
Sodium: 133 mmol/L — ABNORMAL LOW (ref 135–145)
Total Bilirubin: 2 mg/dL — ABNORMAL HIGH (ref 0.3–1.2)
Total Protein: 6 g/dL — ABNORMAL LOW (ref 6.5–8.1)

## 2021-10-28 LAB — TROPONIN I (HIGH SENSITIVITY): Troponin I (High Sensitivity): 43 ng/L — ABNORMAL HIGH (ref <18)

## 2021-10-28 LAB — GLUCOSE, CAPILLARY
Glucose-Capillary: 269 mg/dL — ABNORMAL HIGH (ref 70–99)
Glucose-Capillary: 131 mg/dL — ABNORMAL HIGH (ref 70–99)
Glucose-Capillary: 165 mg/dL — ABNORMAL HIGH (ref 70–99)

## 2021-10-28 LAB — MAGNESIUM: Magnesium: 2.2 mg/dL (ref 1.7–2.4)

## 2021-10-28 LAB — CBG MONITORING, ED: Glucose-Capillary: 372 mg/dL — ABNORMAL HIGH (ref 70–99)

## 2021-10-28 MED ORDER — SODIUM CHLORIDE 0.9 % IV SOLN: INTRAVENOUS | Status: DC

## 2021-10-28 MED ORDER — SODIUM BICARBONATE 650 MG PO TABS
650.0000 mg | ORAL_TABLET | Freq: Three times a day (TID) | ORAL | Status: DC
Start: 2021-10-28 — End: 2021-10-29
  Administered 2021-10-28 – 2021-10-29 (×4): 650 mg via ORAL
  Filled 2021-10-28 (×4): qty 1

## 2021-10-28 NOTE — Evaluation (Signed)
Physical Therapy Evaluation ?Patient Details ?Name: Ryan [name] ?MRN: [id number] ?DOB: Jan 04, 1946 ?Today's Date: 10/28/2021 ? ?History of Present Illness ? Pt is a 76 y/o male admitted secondary to URI and dehydration. Found to have AKI. PMH includes DM, HTN, dCHF, and a flutter.  ?Clinical Impression ? Pt admitted secondary to problem above with deficits below. Pt with mild unsteadiness initially, but balance improved with further ambulation. Pt reports family able to assist at home. Educated about use of cane if needed for increased safety. Further mobility needs to be deferred to mobility specialist team. Will sign off. If needs change, please re-consult.  ?   ? ?Recommendations for follow up therapy are one component of a multi-disciplinary discharge planning process, led by the attending physician.  Recommendations may be updated based on patient status, additional functional criteria and insurance authorization. ? ?Follow Up Recommendations No PT follow up ? ?  ?Assistance Recommended at Discharge Intermittent Supervision/Assistance  ?Patient can return home with the following ? Assistance with cooking/housework ? ?  ?Equipment Recommendations None recommended by PT  ?Recommendations for Other Services ?    ?  ?Functional Status Assessment Patient has had a recent decline in their functional status and demonstrates the ability to make significant improvements in function in a reasonable and predictable amount of time.  ? ?  ?Precautions / Restrictions Precautions ?Precautions: None ?Restrictions ?Weight Bearing Restrictions: No  ? ?  ? ?Mobility ? Bed Mobility ?Overal bed mobility: Independent ?  ?  ?  ?  ?  ?  ?  ?  ? ?Transfers ?Overall transfer level: Independent ?  ?  ?  ?  ?  ?  ?  ?  ?  ?  ? ?Ambulation/Gait ?Ambulation/Gait assistance: Supervision, Min guard ?Gait Distance (Feet): 250 Feet ?Assistive device: None ?Gait Pattern/deviations: Step-through pattern, Decreased stride length ?Gait velocity:  Decreased ?  ?  ?General Gait Details: Pt with 1 LOB initially, however, balance improved as pt ambulated further. Required min guard to supervision for safety. Educated about using cane for safety at home. ? ?Stairs ?  ?  ?  ?  ?  ? ?Wheelchair Mobility ?  ? ?Modified Rankin (Stroke Patients Only) ?  ? ?  ? ?Balance Overall balance assessment: Mild deficits observed, not formally tested ?  ?  ?  ?  ?  ?  ?  ?  ?  ?  ?  ?  ?  ?  ?  ?  ?  ?  ?   ? ? ? ?Pertinent Vitals/Pain Pain Assessment ?Pain Assessment: No/denies pain  ? ? ?Home Living Family/patient expects to be discharged to:: Private residence ?Living Arrangements: Children;Other relatives (grandkids) ?Available Help at Discharge: Family ?Type of Home: House ?Home Access: Level entry ?  ?  ?  ?Home Layout: Other (Comment) (4 steps into kitchen no rail) ?Home Equipment: Gilmer Mor - single point;Shower seat ?   ?  ?Prior Function Prior Level of Function : Independent/Modified Independent ?  ?  ?  ?  ?  ?  ?  ?  ?  ? ? ?Hand Dominance  ?   ? ?  ?Extremity/Trunk Assessment  ? Upper Extremity Assessment ?Upper Extremity Assessment: Overall WFL for tasks assessed ?  ? ?Lower Extremity Assessment ?Lower Extremity Assessment: Generalized weakness ?  ? ?Cervical / Trunk Assessment ?Cervical / Trunk Assessment: Normal  ?Communication  ? Communication: No difficulties  ?Cognition Arousal/Alertness: Awake/alert ?Behavior During Therapy: Comprehensive Outpatient [facility] for tasks assessed/performed ?Overall Cognitive Status:  No family/caregiver present to determine baseline cognitive functioning ?  ?  ?  ?  ?  ?  ?  ?  ?  ?  ?  ?  ?  ?  ?  ?  ?  ?  ?  ? ?  ?General Comments   ? ?  ?Exercises    ? ?Assessment/Plan  ?  ?PT Assessment Patient does not need any further PT services  ?PT Problem List   ? ?   ?  ?PT Treatment Interventions     ? ?PT Goals (Current goals can be found in the Care Plan section)  ?Acute Rehab PT Goals ?Patient Stated Goal: to go home ?PT Goal Formulation: With patient ?Time For Goal  Achievement: 10/28/21 ?Potential to Achieve Goals: Good ? ?  ?Frequency   ?  ? ? ?Co-evaluation   ?  ?  ?  ?  ? ? ?  ?AM-PAC PT "6 Clicks" Mobility  ?Outcome Measure Help needed turning from your back to your side while in a flat bed without using bedrails?: None ?Help needed moving from lying on your back to sitting on the side of a flat bed without using bedrails?: None ?Help needed moving to and from a bed to a chair (including a wheelchair)?: None ?Help needed standing up from a chair using your arms (e.g., wheelchair or bedside chair)?: None ?Help needed to walk in hospital room?: A Little ?Help needed climbing 3-5 steps with a railing? : A Little ?6 Click Score: 22 ? ?  ?End of Session Equipment Utilized During Treatment: Gait belt ?Activity Tolerance: Patient tolerated treatment well ?Patient left: in bed;with call bell/phone within reach ?Nurse Communication: Mobility status ?PT Visit Diagnosis: Unsteadiness on feet (R26.81) ?  ? ?Time: 0086-7619 ?PT Time Calculation (min) (ACUTE ONLY): 12 min ? ? ?Charges:   PT Evaluation ?$PT Eval Low Complexity: 1 Low ?  ?  ?   ? ? ?Farley Ly, PT, DPT  ?Acute Rehabilitation Services  ?Pager: [phone] ?Office: [phone] ? ? ?Ryan [name] ?10/28/2021, 4:42 PM ? ?

## 2021-10-28 NOTE — Progress Notes (Signed)
PROGRESS NOTE    Ryan [name]  [id number] DOB: [birth year] DOA: 10/27/2021 PCP: [name], NP   Brief Narrative:  This 76 years old male with PMH significant for diabetes,  hypertension, chronic diastolic heart failure, atrial flutter on chronic anticoagulation presents in the ED with URI symptoms and hyperglycemia.  Patient reported he has been sick on and off for last 4 months.  Patient reports having diarrhea yesterday,  denies any fevers. He has been checking his blood sugar which remains elevated above 400.  He uses insulin at home.  Lab work in the ED shows lactic acid 2.3, serum creatinine 1.44, chest x-ray shows coarse inspirational thickening. Patient is admitted for acute kidney injury secondary to dehydration.  Patient is requiring IV hydration.   Assessment & Plan:   Principal Problem:   Dehydration Active Problems:   AKI (acute kidney injury) ([facility])   HTN (hypertension)   Poorly controlled type 2 diabetes mellitus with circulatory disorder (HCC)   Atrial flutter (HCC)   Chronic diastolic CHF (congestive heart failure) (HCC)   Chronic anticoagulation  Acute kidney injury: Baseline serum creatinine 0.9-1.0, serum creatinine on arrival 1.57 Likely secondary to dehydration, and lisinopril and diuretic use. Avoid nephrotoxic medications. Continue IV hydration.  Serum creatinine improving.  1.57> 1.37  Lactic acidosis: Likely secondary to dehydration. Resolved with IV hydration.  Elevated troponins: Likely demand ischemia in the setting of dehydration. Patient denies any chest pain, less likely ACS Troponin 68 > 61 > 43  Atrial flutter: Heart rate remains controlled.  EKG shows normal sinus rhythm. Continue Toprol XL.  Continue Eliquis for CVA prophylaxis.  Type 2 diabetes.: Recent blood sugars uncontrolled. Continue Levemir and sliding scale. Obtain hemoglobin A1c.  Essential hypertension: Patient has been taking Lasix and lisinopril despite being  having diarrhea. Hold Lasix and lisinopril. Continue Toprol-XL.  Chronic diastolic CHF: Patient is acutely dehydrated.  No evidence of acute heart failure.   DVT prophylaxis: Lovenox Code Status: Full code. Family Communication: No family at bed side. Disposition Plan:   Status is: Observation The patient remains OBS appropriate and will d/c before 2 midnights.   Admitted for AKI likely secondary to dehydration from diarrhea.  Consultants:  NONE  Procedures:None  Antimicrobials: (None   Subjective: Patient was seen and examined at bedside.  Overnight events noted.   Patient reports feeling improved.  He still reports feeling weak and tired.  Diarrhea has improved.  Objective: Vitals:   10/28/21 0630 10/28/21 0645 10/28/21 0700 10/28/21 1000  BP: 116/87 120/69 127/67 113/76  Pulse: 63 67 65 63  Resp: (!) '30 17 19 18  '$ Temp:    98.1 F (36.7 C)  TempSrc:    Oral  SpO2: 97% 99% 97% 98%    Intake/Output Summary (Last 24 hours) at 10/28/2021 1524 Last data filed at 10/28/2021 1227 Gross per 24 hour  Intake 240 ml  Output 500 ml  Net -260 ml   There were no vitals filed for this visit.  Examination:  General exam: Appears comfortable, not in any acute distress.  Deconditioned Respiratory system: CTA bilaterally, no wheezing, no crackles, normal respiratory effort. Cardiovascular system: S1 & S2 heard, regular rate and rhythm, no murmur.   Gastrointestinal system: Abdomen is soft, non tender, nondistended, BS + Central nervous system: Alert and oriented x 3. No focal neurological deficits. Extremities: No edema, no cyanosis, no clubbing. Skin: No rashes, lesions or ulcers Psychiatry: Judgement and insight appear normal. Mood & affect appropriate.  Data Reviewed: I have personally reviewed following labs and imaging studies  CBC: Recent Labs  Lab 10/27/21 1402 10/27/21 1936 10/28/21 0449  WBC 12.8*  --  11.8*  NEUTROABS 10.2*  --  8.6*  HGB 14.6 13.3  11.8*  HCT 45.8 39.0 36.9*  MCV 94.8  --  93.2  PLT 281  --  [id number]   Basic Metabolic Panel: Recent Labs  Lab 10/27/21 1402 10/27/21 1936 10/27/21 2241 10/28/21 0449  NA 135 133* 131* 133*  K 4.9 5.3* 5.1 4.6  CL 99  --  100 104  CO2 18*  --  16* 16*  GLUCOSE 333*  --  375* 264*  BUN 23  --  29* 27*  CREATININE 1.44*  --  1.57* 1.37*  CALCIUM 9.7  --  8.9 8.8*  MG  --   --   --  2.2   GFR: CrCl cannot be calculated (Unknown ideal weight.). Liver Function Tests: Recent Labs  Lab 10/27/21 1402 10/28/21 0449  AST 27 20  ALT 22 18  ALKPHOS 91 71  BILITOT 1.9* 2.0*  PROT 7.8 6.0*  ALBUMIN 3.8 3.0*   Recent Labs  Lab 10/27/21 1848  LIPASE 20   No results for input(s): AMMONIA in the last 168 hours. Coagulation Profile: No results for input(s): INR, PROTIME in the last 168 hours. Cardiac Enzymes: No results for input(s): CKTOTAL, CKMB, CKMBINDEX, TROPONINI in the last 168 hours. BNP (last 3 results) No results for input(s): PROBNP in the last 8760 hours. HbA1C: Recent Labs    10/27/21 2323  HGBA1C 7.2*   CBG: Recent Labs  Lab 10/27/21 1805 10/28/21 0004 10/28/21 1155  GLUCAP 295* 372* 269*   Lipid Profile: No results for input(s): CHOL, HDL, LDLCALC, TRIG, CHOLHDL, LDLDIRECT in the last 72 hours. Thyroid Function Tests: No results for input(s): TSH, T4TOTAL, FREET4, T3FREE, THYROIDAB in the last 72 hours. Anemia Panel: No results for input(s): VITAMINB12, FOLATE, FERRITIN, TIBC, IRON, RETICCTPCT in the last 72 hours. Sepsis Labs: Recent Labs  Lab 10/27/21 1820 10/27/21 2115  LATICACIDVEN 2.3* 1.7    Recent Results (from the past 240 hour(s))  Resp Panel by RT-PCR (Flu A&B, Covid) Nasopharyngeal Swab     Status: None   Collection Time: 10/27/21 12:28 PM   Specimen: Nasopharyngeal Swab; Nasopharyngeal(NP) swabs in vial transport medium  Result Value Ref Range Status   SARS Coronavirus 2 by RT PCR NEGATIVE NEGATIVE Final    Comment:  (NOTE) SARS-CoV-2 target nucleic acids are NOT DETECTED.  The SARS-CoV-2 RNA is generally detectable in upper respiratory specimens during the acute phase of infection. The lowest concentration of SARS-CoV-2 viral copies this assay can detect is 138 copies/mL. A negative result does not preclude SARS-Cov-2 infection and should not be used as the sole basis for treatment or other patient management decisions. A negative result may occur with  improper specimen collection/handling, submission of specimen other than nasopharyngeal swab, presence of viral mutation(s) within the areas targeted by this assay, and inadequate number of viral copies(<138 copies/mL). A negative result must be combined with clinical observations, patient history, and epidemiological information. The expected result is Negative.  Fact Sheet for Patients:  EntrepreneurPulse.com.au  Fact Sheet for Healthcare Providers:  IncredibleEmployment.be  This test is no t yet approved or cleared by the [place] FDA and  has been authorized for detection and/or diagnosis of SARS-CoV-2 by FDA under an Emergency Use Authorization (EUA). This EUA will remain  in effect (meaning this test can  be used) for the duration of the COVID-19 declaration under Section 564(b)(1) of the Act, 21 U.S.C.section 360bbb-3(b)(1), unless the authorization is terminated  or revoked sooner.       Influenza A by PCR NEGATIVE NEGATIVE Final   Influenza B by PCR NEGATIVE NEGATIVE Final    Comment: (NOTE) The Xpert Xpress SARS-CoV-2/FLU/RSV plus assay is intended as an aid in the diagnosis of influenza from Nasopharyngeal swab specimens and should not be used as a sole basis for treatment. Nasal washings and aspirates are unacceptable for Xpert Xpress SARS-CoV-2/FLU/RSV testing.  Fact Sheet for Patients: EntrepreneurPulse.com.au  Fact Sheet for Healthcare  Providers: IncredibleEmployment.be  This test is not yet approved or cleared by the [place] FDA and has been authorized for detection and/or diagnosis of SARS-CoV-2 by FDA under an Emergency Use Authorization (EUA). This EUA will remain in effect (meaning this test can be used) for the duration of the COVID-19 declaration under Section 564(b)(1) of the Act, 21 U.S.C. section 360bbb-3(b)(1), unless the authorization is terminated or revoked.  Performed at [facility], [place] [address] [place], [place] [address]   Blood culture (routine x 2)     Status: None (Preliminary result)   Collection Time: 10/27/21  6:25 PM   Specimen: BLOOD LEFT ARM  Result Value Ref Range Status   Specimen Description BLOOD LEFT ARM  Final   Special Requests   Final    BOTTLES DRAWN AEROBIC AND ANAEROBIC Blood Culture adequate volume   Culture   Final    NO GROWTH < 12 HOURS Performed at [facility], [place] [address], [place], [place] [address]    Report Status PENDING  Incomplete  Blood culture (routine x 2)     Status: None (Preliminary result)   Collection Time: 10/27/21  6:49 PM   Specimen: BLOOD RIGHT ARM  Result Value Ref Range Status   Specimen Description BLOOD RIGHT ARM  Final   Special Requests   Final    BOTTLES DRAWN AEROBIC AND ANAEROBIC Blood Culture results may not be optimal due to an inadequate volume of blood received in culture bottles   Culture   Final    NO GROWTH < 12 HOURS Performed at [facility], [place] [address] [place],  [address]    Report Status PENDING  Incomplete    Radiology Studies: DG Chest 2 View  Result Date: 10/27/2021 CLINICAL DATA:  URI symptoms. EXAM: CHEST - 2 VIEW COMPARISON:  Chest x-ray dated April 02, 2020. FINDINGS: The heart size and mediastinal contours are within normal limits. Progressive peripheral and basilar predominant coarse interstitial thickening. No focal consolidation, pleural effusion, or  pneumothorax. No acute osseous abnormality. IMPRESSION: 1. Progressive peripheral and basilar predominant coarse interstitial thickening, suspicious for chronic interstitial lung disease. Recommend non-emergent outpatient high-resolution chest CT for further evaluation. Electronically Signed   By: Titus Dubin M.D.   On: 10/27/2021 17:00    Scheduled Meds:  apixaban  5 mg Oral BID   atorvastatin  40 mg Oral QHS   insulin aspart  0-15 Units Subcutaneous TID WC   insulin aspart  0-5 Units Subcutaneous QHS   insulin detemir  12 Units Subcutaneous QHS   metoprolol succinate  25 mg Oral Daily   sodium bicarbonate  650 mg Oral TID   Continuous Infusions:   LOS: 0 days    Time spent: 50 mins    Jenella Craigie, MD [facility]   If 7PM-7AM, please contact night-coverage

## 2021-10-28 NOTE — ED Notes (Signed)
Iv pump beeping, rn entered room. Restarted pump, provided pt w/ a urinal. Pt denies any needs at this time, no acute distress noted. ?

## 2021-10-28 NOTE — Progress Notes (Addendum)
Inpatient Diabetes Program Recommendations ? ?AACE/ADA: New Consensus Statement on Inpatient Glycemic Control (2015) ? ?Target Ranges:  Prepandial:   less than 140 mg/dL ?     Peak postprandial:   less than 180 mg/dL (1-2 hours) ?     Critically ill patients:  140 - 180 mg/dL  ? ?Lab Results  ?Component Value Date  ? GLUCAP 372 (H) 10/28/2021  ? HGBA1C 7.2 (H) 10/27/2021  ? ? ?Review of Glycemic Control ? Latest Reference Range & Units 10/27/21 18:05 10/28/21 00:04  ?Glucose-Capillary 70 - 99 mg/dL 295 (H) 372 (H)  ? ?Diabetes history: DM 2 ?Outpatient Diabetes medications:  ?Novolog 5-6 units tid with meals  ?Levemir 12 units q HS ?Current orders for Inpatient glycemic control:  ?Novolog moderate tid with meals and HS ?Levemir 12 units q HS ? ?Inpatient Diabetes Program Recommendations:   ? ?Agree with current orders.  May also need the addition of meal coverage based on home medications.  Will follow. Alerted RN that patient needs current CBG checked.  ? ?Thanks,  ?Adah Perl, RN, BC-ADM ?Inpatient Diabetes Coordinator ?Pager [phone]  (8a-5p) ? ? ?

## 2021-10-29 DIAGNOSIS — E860 Dehydration: Secondary | ICD-10-CM | POA: Diagnosis not present

## 2021-10-29 LAB — BASIC METABOLIC PANEL
Anion gap: 4 — ABNORMAL LOW (ref 5–15)
BUN: 20 mg/dL (ref 8–23)
CO2: 25 mmol/L (ref 22–32)
Calcium: 8.2 mg/dL — ABNORMAL LOW (ref 8.9–10.3)
Chloride: 106 mmol/L (ref 98–111)
Creatinine, Ser: 0.93 mg/dL (ref 0.61–1.24)
GFR, Estimated: >60 mL/min (ref >60)
Glucose, Bld: 201 mg/dL — ABNORMAL HIGH (ref 70–99)
Potassium: 4 mmol/L (ref 3.5–5.1)
Sodium: 135 mmol/L (ref 135–145)

## 2021-10-29 LAB — CBC
HCT: 35.4 % — ABNORMAL LOW (ref 39.0–52.0)
Hemoglobin: 12.1 g/dL — ABNORMAL LOW (ref 13.0–17.0)
MCH: 30.6 pg (ref 26.0–34.0)
MCHC: 34.2 g/dL (ref 30.0–36.0)
MCV: 89.4 fL (ref 80.0–100.0)
Platelets: 217 10*3/uL (ref 150–400)
RBC: 3.96 MIL/uL — ABNORMAL LOW (ref 4.22–5.81)
RDW: 13 % (ref 11.5–15.5)
WBC: 11.3 10*3/uL — ABNORMAL HIGH (ref 4.0–10.5)
nRBC: 0 % (ref 0.0–0.2)

## 2021-10-29 LAB — PHOSPHORUS: Phosphorus: 1.9 mg/dL — ABNORMAL LOW (ref 2.5–4.6)

## 2021-10-29 LAB — MAGNESIUM: Magnesium: 2 mg/dL (ref 1.7–2.4)

## 2021-10-29 LAB — GLUCOSE, CAPILLARY
Glucose-Capillary: 109 mg/dL — ABNORMAL HIGH (ref 70–99)
Glucose-Capillary: 117 mg/dL — ABNORMAL HIGH (ref 70–99)

## 2021-10-29 NOTE — Discharge Summary (Signed)
Physician Discharge Summary   Patient: Ryan [name] MRN: [id number] DOB: Mar 01, 1946  Admit date:     10/27/2021  Discharge date: 10/29/21  Discharge Physician: Shawna Clamp   PCP: [name], NP   Recommendations at discharge:  Advised to follow-up with primary care physician in 1 week. Please check CBC BMP in 1 week.  Discharge Diagnoses: Principal Problem:   Dehydration Active Problems:   AKI (acute kidney injury) ([place])   HTN (hypertension)   Poorly controlled type 2 diabetes mellitus with circulatory disorder (HCC)   Atrial flutter (HCC)   Chronic diastolic CHF (congestive heart failure) (HCC)   Chronic anticoagulation  Resolved Problems:   * No resolved hospital problems. [facility] Course: This 76 years old male with PMH significant for diabetes,  hypertension, chronic diastolic heart failure, atrial flutter on chronic anticoagulation presents in the ED with URI symptoms and hyperglycemia.  Patient reports he has been sick on and off for last 4 months.  Patient reports having diarrhea since yesterday,  denies any fevers. He has been checking his blood sugar which remains elevated above 400.  He uses insulin at home.  Lab work in the ED shows lactic acid 2.3, serum creatinine 1.44, chest x-ray shows coarse interstitial thickening. Patient was admitted for acute kidney injury secondary to dehydration.  Patient was continued on IV hydration.  Lactic acidosis resolved.  AKI resolved.  Serum creatinine back to normal.  Patient feels better and wants to be discharged.  Patient is being discharged home.  Assessment and Plan:  Acute kidney injury: > Resolved. Baseline serum creatinine 0.9-1.0, serum creatinine on arrival 1.57 Likely secondary to dehydration, and lisinopril and diuretic use. Avoid nephrotoxic medications. Continue IV hydration.  Serum creatinine improving.  1.57> 1.37 >0.93   Lactic acidosis: Likely secondary to dehydration. Resolved with IV  hydration.   Elevated troponins: Likely demand ischemia in the setting of dehydration. Patient denies any chest pain, less likely ACS Troponin 68 > 61 > 43   Atrial flutter: Heart rate remains controlled.  EKG shows normal sinus rhythm. Continue Toprol XL.  Continue Eliquis for CVA prophylaxis.   Type 2 diabetes.: Recent blood sugars uncontrolled. Continue Levemir and sliding scale.    Essential hypertension: Patient has been taking Lasix and lisinopril despite being having diarrhea. Hold Lasix and lisinopril. Continue Toprol-XL.   Chronic diastolic CHF: Patient is acutely dehydrated.  No evidence of acute heart failure. Resume home medications.   Pain control - [employer] Controlled Substance Reporting System database was reviewed. and patient was instructed, not to drive, operate heavy machinery, perform activities at heights, swimming or participation in water activities or provide baby-sitting services while on Pain, Sleep and Anxiety Medications; until their outpatient Physician has advised to do so again. Also recommended to not to take more than prescribed Pain, Sleep and Anxiety Medications.  Consultants: None Procedures performed: None Disposition: Home Diet recommendation:  Discharge Diet Orders (From admission, onward)     Start     Ordered   10/29/21 0000  Diet - low sodium heart healthy        10/29/21 0954   10/29/21 0000  Diet Carb Modified        10/29/21 0954           Carb modified diet DISCHARGE MEDICATION: Allergies as of 10/29/2021       Reactions   Other Other (See Comments)   Client states he became very sick and had significant skin inflammation and irritation  with the [facility] sensor        Medication List     TAKE these medications    atorvastatin 40 MG tablet Commonly known as: LIPITOR TAKE 1 TABLET (40 MG TOTAL) BY MOUTH DAILY. What changed: when to take this   blood glucose meter kit and supplies by Other route  See admin instructions. Check blood sugar 6-7 times daily   Calcium Carbonate-Vitamin D 600-400 MG-UNIT tablet Take 1 tablet by mouth 2 (two) times daily. Reported on 01/27/2016   cholecalciferol 25 MCG (1000 UNIT) tablet Commonly known as: VITAMIN D3 Take 1,000 Units by mouth daily.   Eliquis 5 MG Tabs tablet Generic drug: apixaban TAKE 1 TABLET BY MOUTH TWICE DAILY What changed: how much to take   furosemide 20 MG tablet Commonly known as: LASIX TAKE 1 TABLET (20 MG TOTAL) BY MOUTH DAILY. What changed: how much to take   Glucagon Emergency 1 MG Kit Inject 1 mg into the muscle once as needed for up to 1 dose. What changed:  when to take this reasons to take this   insulin aspart 100 UNIT/ML injection Commonly known as: NovoLOG INJECT 5-6 UNITS INTO THE SKIN 3 TIMES A DAY WITH MEALS What changed:  how much to take how to take this when to take this additional instructions   insulin detemir 100 UNIT/ML injection Commonly known as: Levemir INJECT 0.12 ML (12 UNITS) INTO THE SKIN AT BEDTIME What changed:  how much to take how to take this when to take this additional instructions   lisinopril 5 MG tablet Commonly known as: ZESTRIL TAKE 1 TABLET (5 MG TOTAL) BY MOUTH DAILY. What changed: how much to take   metoprolol succinate 25 MG 24 hr tablet Commonly known as: TOPROL-XL TAKE 1 TABLET (25 MG TOTAL) BY MOUTH DAILY. What changed: how much to take   TRUEplus Insulin Syringe 31G X 5/16" 0.5 ML Misc Generic drug: Insulin Syringe-Needle U-100 USE TO INJECT INSULIN 4 TIMES A DAY        Follow-up Information     [name], NP Follow up in 1 week(s).   Specialty: Internal Medicine Contact information: [place] [address] [phone]         Fay Records, MD .   Specialty: Cardiology Contact information: [place] [address] [phone]                Discharge  Exam: Danley Danker Weights   10/29/21 0528  Weight: 73.8 kg   General exam: Appears comfortable, not in any acute distress.  Deconditioned Respiratory system: CTA bilaterally, no wheezing, no crackles, normal respiratory effort. Cardiovascular system: S1 & S2 heard, regular rate and rhythm, no murmur.   Gastrointestinal system: Abdomen is soft, non tender, nondistended, BS + Central nervous system: Alert and oriented x 3. No focal neurological deficits. Extremities: No edema, no cyanosis, no clubbing. Skin: No rashes, lesions or ulcers Psychiatry: Judgement and insight appear normal. Mood & affect appropriate.   Condition at discharge: good  The results of significant diagnostics from this hospitalization (including imaging, microbiology, ancillary and laboratory) are listed below for reference.   Imaging Studies: DG Chest 2 View  Result Date: 10/27/2021 CLINICAL DATA:  URI symptoms. EXAM: CHEST - 2 VIEW COMPARISON:  Chest x-ray dated April 02, 2020. FINDINGS: The heart size and mediastinal contours are within normal limits. Progressive peripheral and basilar predominant coarse interstitial thickening. No focal consolidation, pleural  effusion, or pneumothorax. No acute osseous abnormality. IMPRESSION: 1. Progressive peripheral and basilar predominant coarse interstitial thickening, suspicious for chronic interstitial lung disease. Recommend non-emergent outpatient high-resolution chest CT for further evaluation. Electronically Signed   By: Titus Dubin M.D.   On: 10/27/2021 17:00    Microbiology: Results for orders placed or performed during the hospital encounter of 10/27/21  Resp Panel by RT-PCR (Flu A&B, Covid) Nasopharyngeal Swab     Status: None   Collection Time: 10/27/21 12:28 PM   Specimen: Nasopharyngeal Swab; Nasopharyngeal(NP) swabs in vial transport medium  Result Value Ref Range Status   SARS Coronavirus 2 by RT PCR NEGATIVE NEGATIVE Final    Comment: (NOTE) SARS-CoV-2 target  nucleic acids are NOT DETECTED.  The SARS-CoV-2 RNA is generally detectable in upper respiratory specimens during the acute phase of infection. The lowest concentration of SARS-CoV-2 viral copies this assay can detect is 138 copies/mL. A negative result does not preclude SARS-Cov-2 infection and should not be used as the sole basis for treatment or other patient management decisions. A negative result may occur with  improper specimen collection/handling, submission of specimen other than nasopharyngeal swab, presence of viral mutation(s) within the areas targeted by this assay, and inadequate number of viral copies(<138 copies/mL). A negative result must be combined with clinical observations, patient history, and epidemiological information. The expected result is Negative.  Fact Sheet for Patients:  EntrepreneurPulse.com.au  Fact Sheet for Healthcare Providers:  IncredibleEmployment.be  This test is no t yet approved or cleared by the [place] FDA and  has been authorized for detection and/or diagnosis of SARS-CoV-2 by FDA under an Emergency Use Authorization (EUA). This EUA will remain  in effect (meaning this test can be used) for the duration of the COVID-19 declaration under Section 564(b)(1) of the Act, 21 U.S.C.section 360bbb-3(b)(1), unless the authorization is terminated  or revoked sooner.       Influenza A by PCR NEGATIVE NEGATIVE Final   Influenza B by PCR NEGATIVE NEGATIVE Final    Comment: (NOTE) The Xpert Xpress SARS-CoV-2/FLU/RSV plus assay is intended as an aid in the diagnosis of influenza from Nasopharyngeal swab specimens and should not be used as a sole basis for treatment. Nasal washings and aspirates are unacceptable for Xpert Xpress SARS-CoV-2/FLU/RSV testing.  Fact Sheet for Patients: EntrepreneurPulse.com.au  Fact Sheet for Healthcare  Providers: IncredibleEmployment.be  This test is not yet approved or cleared by the [place] FDA and has been authorized for detection and/or diagnosis of SARS-CoV-2 by FDA under an Emergency Use Authorization (EUA). This EUA will remain in effect (meaning this test can be used) for the duration of the COVID-19 declaration under Section 564(b)(1) of the Act, 21 U.S.C. section 360bbb-3(b)(1), unless the authorization is terminated or revoked.  Performed at [facility], [place] [address], [place], [place] [address]   Blood culture (routine x 2)     Status: None (Preliminary result)   Collection Time: 10/27/21  6:25 PM   Specimen: BLOOD LEFT ARM  Result Value Ref Range Status   Specimen Description BLOOD LEFT ARM  Final   Special Requests   Final    BOTTLES DRAWN AEROBIC AND ANAEROBIC Blood Culture adequate volume   Culture   Final    NO GROWTH 2 DAYS Performed at [facility], [place] [address], [place], [place] [address]    Report Status PENDING  Incomplete  Blood culture (routine x 2)     Status: None (Preliminary result)  Collection Time: 10/27/21  6:49 PM   Specimen: BLOOD RIGHT ARM  Result Value Ref Range Status   Specimen Description BLOOD RIGHT ARM  Final   Special Requests   Final    BOTTLES DRAWN AEROBIC AND ANAEROBIC Blood Culture results may not be optimal due to an inadequate volume of blood received in culture bottles   Culture   Final    NO GROWTH 2 DAYS Performed at [facility], [place] [address], [place],  [address]    Report Status PENDING  Incomplete    Labs: CBC: Recent Labs  Lab 10/27/21 1402 10/27/21 1936 10/28/21 0449 10/29/21 0131  WBC 12.8*  --  11.8* 11.3*  NEUTROABS 10.2*  --  8.6*  --   HGB 14.6 13.3 11.8* 12.1*  HCT 45.8 39.0 36.9* 35.4*  MCV 94.8  --  93.2 89.4  PLT 281  --  235 [id number]   Basic Metabolic Panel: Recent Labs  Lab 10/27/21 1402 10/27/21 1936 10/27/21 2241 10/28/21 0449  10/29/21 0131  NA 135 133* 131* 133* 135  K 4.9 5.3* 5.1 4.6 4.0  CL 99  --  100 104 106  CO2 18*  --  16* 16* 25  GLUCOSE 333*  --  375* 264* 201*  BUN 23  --  29* 27* 20  CREATININE 1.44*  --  1.57* 1.37* 0.93  CALCIUM 9.7  --  8.9 8.8* 8.2*  MG  --   --   --  2.2 2.0  PHOS  --   --   --   --  1.9*   Liver Function Tests: Recent Labs  Lab 10/27/21 1402 10/28/21 0449  AST 27 20  ALT 22 18  ALKPHOS 91 71  BILITOT 1.9* 2.0*  PROT 7.8 6.0*  ALBUMIN 3.8 3.0*   CBG: Recent Labs  Lab 10/28/21 1155 10/28/21 1711 10/28/21 2052 10/29/21 0837 10/29/21 1100  GLUCAP 269* 131* 165* 109* 117*    Discharge time spent: greater than 30 minutes.  Signed: Shawna Clamp, MD [facility] 10/29/2021

## 2021-10-29 NOTE — TOC Initial Note (Signed)
Transition of Care (TOC) - Initial/Assessment Note  ? ? ?Patient Details  ?Name: Ryan [name] ?MRN: [id number] ?Date of Birth: March 13, 1946 ? ?Transition of Care (TOC) CM/SW Contact:    ?Lockie Pares, RN ?Phone Number: ?10/29/2021, 8:29 AM ? ?Clinical Narrative:                 ? ?The Transition of Care Department [facility]) has reviewed patient and no TOC needs have been identified at this time. We will continue to monitor patient advancement through interdisciplinary progression rounds. If new patient transition needs arise, please place a TOC consult ?  ?  ? ? ?Patient Goals and CMS Choice ?  ?  ?  ? ?Expected Discharge Plan and Services ?  ?  ?  ?  ?  ?                ?  ?  ?  ?  ?  ?  ?  ?  ?  ?  ? ?Prior Living Arrangements/Services ?  ?  ?  ?       ?  ?  ?  ?  ? ?Activities of Daily Living ?  ?  ? ?Permission Sought/Granted ?  ?  ?   ?   ?   ?   ? ?Emotional Assessment ?  ?  ?  ?  ?  ?  ? ?Admission diagnosis:  Dehydration [E86.0] ?Lactic acidosis [E87.20] ?AKI (acute kidney injury) (HCC) [N17.9] ?Patient Active Problem List  ? Diagnosis Date Noted  ? Dehydration 10/27/2021  ? Chronic anticoagulation 10/27/2021  ? AKI (acute kidney injury) (HCC) 10/27/2021  ? Right hip pain 01/23/2021  ? Chronic diastolic CHF (congestive heart failure) (HCC) 07/27/2020  ? Moderate mitral regurgitation 07/27/2020  ? Overweight (BMI 25.0-29.9) 01/12/2019  ? Atrial flutter (HCC) 01/27/2016  ? Poorly controlled type 2 diabetes mellitus with circulatory disorder (HCC) 09/08/2015  ? Preventative health care 02/05/2015  ? Benign paroxysmal positional vertigo 01/15/2014  ? Skin lesion of left arm 05/11/2013  ? Osteopenia 06/09/2011  ? Hyperbilirubinemia 05/26/2011  ? HTN (hypertension) 02/22/2011  ? Hyperlipidemia 02/22/2011  ? GERD (gastroesophageal reflux disease) 02/22/2011  ? Allergic rhinitis 02/22/2011  ? ?PCP:  Sandford Craze, NP ?Pharmacy:   ?[facility] ?[address] ?[place] [address] ?Phone: [phone] Fax: [phone] ? ?[facility] ?[address] ?[place] [address] ?Phone: [phone] Fax: [phone] ? ?[facility] (New Address) - [place], [place] - [address] [facility] AT Previously: [address], [place] ?[address] ?[address] ?[place] [address] ?Phone: [phone] Fax: [phone] ? ? ? ? ?Social Determinants of Health (SDOH) Interventions ?  ? ?Readmission Risk Interventions ?   ? View : No data to display.  ?  ?  ?  ? ? ? ?

## 2021-10-29 NOTE — Discharge Planning (Signed)
Patient discharged home in stable condition. Verbalizes understanding of all discharge instructions, including home medications and follow up appointments. 

## 2021-10-30 ENCOUNTER — Ambulatory Visit: Payer: HMO | Admitting: Internal Medicine

## 2021-11-01 LAB — CULTURE, BLOOD (ROUTINE X 2)
Culture: NO GROWTH
Culture: NO GROWTH
Special Requests: ADEQUATE

## 2021-11-02 ENCOUNTER — Telehealth: Payer: Self-pay

## 2021-11-02 NOTE — Telephone Encounter (Signed)
Transition Care Management Follow-up Telephone Call ?Date of discharge and from where: TCM DC Ryan [name] 10-29-21 Dx: dehydration  ?How have you been since you were released from the hospital? Not feeling well  ?Any questions or concerns? No ? ?Items Reviewed: ?Did the pt receive and understand the discharge instructions provided? Yes  ?Medications obtained and verified? Yes  ?Other? No  ?Any new allergies since your discharge? No  ?Dietary orders reviewed? Yes ?Do you have support at home? Yes  ? ?Home Care and Equipment/Supplies: ?Were home health services ordered? no ?If so, what is the name of the agency? na  ?Has the agency set up a time to come to the patient's home? no ?Were any new equipment or medical supplies ordered?  No ?What is the name of the medical supply agency? na ?Were you able to get the supplies/equipment? not applicable ?Do you have any questions related to the use of the equipment or supplies? No ? ?Functional Questionnaire: (I = Independent and D = Dependent) ?ADLs: D ? ?Bathing/Dressing- D ? ?Meal Prep- D ? ?Eating- I ? ?Maintaining continence- I ? ?Transferring/Ambulation- I ? ?Managing Meds- D ? ?Follow up appointments reviewed: ? ?PCP Hospital f/u appt confirmed? Yes  Scheduled to see Dr Peggyann Juba on 11-03-21 @ 9am. ?Specialist Hospital f/u appt confirmed? No . ?Are transportation arrangements needed? No  ?If their condition worsens, is the pt aware to call PCP or go to the Emergency Dept.? Yes ?Was the patient provided with contact information for the PCP's office or ED? Yes ?Was to pt encouraged to call back with questions or concerns? Yes  ?

## 2021-11-03 ENCOUNTER — Other Ambulatory Visit (HOSPITAL_BASED_OUTPATIENT_CLINIC_OR_DEPARTMENT_OTHER): Payer: Self-pay

## 2021-11-03 ENCOUNTER — Ambulatory Visit (INDEPENDENT_AMBULATORY_CARE_PROVIDER_SITE_OTHER): Payer: HMO | Admitting: Family

## 2021-11-03 VITALS — BP 113/69 | HR 70 | Temp 98.3°F | Resp 16 | Wt 158.0 lb

## 2021-11-03 DIAGNOSIS — I5032 Chronic diastolic (congestive) heart failure: Secondary | ICD-10-CM

## 2021-11-03 DIAGNOSIS — I10 Essential (primary) hypertension: Secondary | ICD-10-CM | POA: Diagnosis not present

## 2021-11-03 DIAGNOSIS — E118 Type 2 diabetes mellitus with unspecified complications: Secondary | ICD-10-CM

## 2021-11-03 DIAGNOSIS — N179 Acute kidney failure, unspecified: Secondary | ICD-10-CM | POA: Diagnosis not present

## 2021-11-03 DIAGNOSIS — J849 Interstitial pulmonary disease, unspecified: Secondary | ICD-10-CM | POA: Diagnosis not present

## 2021-11-03 DIAGNOSIS — Z794 Long term (current) use of insulin: Secondary | ICD-10-CM | POA: Diagnosis not present

## 2021-11-03 LAB — CBC WITH DIFFERENTIAL/PLATELET
Basophils Absolute: 0 10*3/uL (ref 0.0–0.1)
Basophils Relative: 0.4 % (ref 0.0–3.0)
Eosinophils Absolute: 0.3 10*3/uL (ref 0.0–0.7)
Eosinophils Relative: 4 % (ref 0.0–5.0)
HCT: 42.1 % (ref 39.0–52.0)
Hemoglobin: 13.8 g/dL (ref 13.0–17.0)
Lymphocytes Relative: 26 % (ref 12.0–46.0)
Lymphs Abs: 1.8 10*3/uL (ref 0.7–4.0)
MCHC: 32.7 g/dL (ref 30.0–36.0)
MCV: 91.6 fl (ref 78.0–100.0)
Monocytes Absolute: 1.1 10*3/uL — ABNORMAL HIGH (ref 0.1–1.0)
Monocytes Relative: 15.4 % — ABNORMAL HIGH (ref 3.0–12.0)
Neutro Abs: 3.7 10*3/uL (ref 1.4–7.7)
Neutrophils Relative %: 54.2 % (ref 43.0–77.0)
Platelets: 254 10*3/uL (ref 150.0–400.0)
RBC: 4.6 Mil/uL (ref 4.22–5.81)
RDW: 13.4 % (ref 11.5–15.5)
WBC: 6.9 10*3/uL (ref 4.0–10.5)

## 2021-11-03 LAB — COMPREHENSIVE METABOLIC PANEL
ALT: 24 U/L (ref 0–53)
AST: 22 U/L (ref 0–37)
Albumin: 3.9 g/dL (ref 3.5–5.2)
Alkaline Phosphatase: 76 U/L (ref 39–117)
BUN: 14 mg/dL (ref 6–23)
CO2: 30 mEq/L (ref 19–32)
Calcium: 9.5 mg/dL (ref 8.4–10.5)
Chloride: 99 mEq/L (ref 96–112)
Creatinine, Ser: 0.94 mg/dL (ref 0.40–1.50)
GFR: 79.31 mL/min (ref >60.00)
Glucose, Bld: 214 mg/dL — ABNORMAL HIGH (ref 70–99)
Potassium: 4.2 mEq/L (ref 3.5–5.1)
Sodium: 135 mEq/L (ref 135–145)
Total Bilirubin: 1 mg/dL (ref 0.2–1.2)
Total Protein: 6.8 g/dL (ref 6.0–8.3)

## 2021-11-03 MED ORDER — BENZONATATE 100 MG PO CAPS
100.0000 mg | ORAL_CAPSULE | Freq: Three times a day (TID) | ORAL | 0 refills | Status: DC | PRN
  Filled 2021-11-03: qty 20, 7d supply, fill #0

## 2021-11-03 NOTE — Progress Notes (Signed)
Subjective:   By signing my name below, I, [name], attest that this documentation has been prepared under the direction and in the presence of [name] NP, 11/03/2021     Patient ID: Ryan [name], male    DOB: 1946/02/12, 76 y.o.   MRN: [id number]  Chief Complaint  Patient presents with   Follow-up    Here for hospital follow up   Cough    Complains of dry cough    HPI Patient is in today for a hospital follow up. He is accompanied by his son.  AKI and Dehydration in ED - He was admitted into the ED on 10/27/2021 for a cough, dehydration, vomiting, shortness of breath, and pressure in the chest. CXR noted no pneumonia but showed changes concerning for interstitial lung disease. An outpatient CT scan was recommended.  While in the hospital, his blood pressure was low and blood sugars were elevated. He received hydration and was later discharged on 10/29/2021. He was advised at time of discharge to hold his lasix and his lisinopril, but he tells me that he restarted these medications "because I hadn't talked to you about it yet."   As of today's visit, he is sill feeling weak, has a persistent cough and a loss of appetite. He denies of any swelling in the legs.   He reports home glucose readings have been stable since he returned home.  Health Maintenance Due  Topic Date Due   COVID-19 Vaccine (4 - Booster for Pfizer series) 09/16/2020   URINE MICROALBUMIN  10/08/2020   OPHTHALMOLOGY EXAM  01/23/2021   TETANUS/TDAP  08/24/2021   Fecal DNA (Cologuard)  09/27/2021    Past Medical History:  Diagnosis Date   Allergy    Diabetes mellitus    GERD (gastroesophageal reflux disease)    History of chicken pox    Hyperlipidemia    Hypertension    Myocardial infarction ([place]) 2017   Type 2 diabetes mellitus with hyperglycemia, with long-term current use of insulin ([place]) 09/08/2015    Past Surgical History:  Procedure Laterality Date   EYE SURGERY  1998   bilateral    SHOULDER SURGERY  1990   right shoulder, torn rotator cuff.    Family History  Problem Relation Age of Onset   Cancer Mother        cancer   Heart disease Mother    Hyperlipidemia Mother    Hypertension Mother    Hyperlipidemia Father    Stroke Father    Hypertension Father    Diabetes Paternal Grandfather     Social History   Socioeconomic History   Marital status: Widowed    Spouse name: Not on file   Number of children: 1   Years of education: Not on file   Highest education level: Not on file  Occupational History   Occupation: retired    Comment: from retail  Tobacco Use   Smoking status: Former    Types: Cigarettes    Quit date: 09/01/1965    Years since quitting: 56.2   Smokeless tobacco: Never  Vaping Use   Vaping Use: Never used  Substance and Sexual Activity   Alcohol use: No   Drug use: No   Sexual activity: Never  Other Topics Concern   Not on file  Social History Narrative   Regular exercise:  5 x weekly   Caffeine Use: 1 cups coffee daily.   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   Son, daughter in law and  young adult granddaughter and grandson live with client   Wife died from ovarian cancer in 11-10-2014         Social Determinants of Health   Financial Resource Strain: Medium Risk   Difficulty of Paying Living Expenses: Somewhat hard  Food Insecurity: No Food Insecurity   Worried About Charity fundraiser in the Last Year: Never true   Arboriculturist in the Last Year: Never true  Transportation Needs: No Transportation Needs   Lack of Transportation (Medical): No   Lack of Transportation (Non-Medical): No  Physical Activity: Insufficiently Active   Days of Exercise per Week: 4 days   Minutes of Exercise per Session: 10 min  Stress: No Stress Concern Present   Feeling of Stress : Not at all  Social Connections: Socially Isolated   Frequency of Communication with Friends and Family: More than three times a week   Frequency of Social Gatherings  with Friends and Family: Once a week   Attends Religious Services: Never   Marine scientist or Organizations: No   Attends Archivist Meetings: Never   Marital Status: Widowed  Human resources officer Violence: Not At Risk   Fear of Current or Ex-Partner: No   Emotionally Abused: No   Physically Abused: No   Sexually Abused: No    Outpatient Medications Prior to Visit  Medication Sig Dispense Refill   apixaban (ELIQUIS) 5 MG TABS tablet TAKE 1 TABLET BY MOUTH TWICE DAILY (Patient taking differently: Take 5 mg by mouth 2 (two) times daily.) 60 tablet 5   atorvastatin (LIPITOR) 40 MG tablet TAKE 1 TABLET (40 MG TOTAL) BY MOUTH DAILY. (Patient taking differently: Take 40 mg by mouth at bedtime.) 90 tablet 1   blood glucose meter kit and supplies by Other route See admin instructions. Check blood sugar 6-7 times daily     Calcium Carbonate-Vitamin D 600-400 MG-UNIT tablet Take 1 tablet by mouth 2 (two) times daily. Reported on 01/27/2016     cholecalciferol (VITAMIN D3) 25 MCG (1000 UNIT) tablet Take 1,000 Units by mouth daily.     furosemide (LASIX) 20 MG tablet TAKE 1 TABLET (20 MG TOTAL) BY MOUTH DAILY. (Patient taking differently: Take 20 mg by mouth daily.) 90 tablet 1   Glucagon, rDNA, (GLUCAGON EMERGENCY) 1 MG KIT Inject 1 mg into the muscle once as needed for up to 1 dose. (Patient taking differently: Inject 1 mg into the muscle as needed (blood sugar below 45).) 1 kit 12   insulin aspart (NOVOLOG) 100 UNIT/ML injection INJECT 5-6 UNITS INTO THE SKIN 3 TIMES A DAY WITH MEALS (Patient taking differently: Inject 0-10 Units into the skin See admin instructions. Per sliding scale 3 times daily) 30 mL 5   insulin detemir (LEVEMIR) 100 UNIT/ML injection INJECT 0.12 ML (12 UNITS) INTO THE SKIN AT BEDTIME (Patient taking differently: Inject 12 Units into the skin at bedtime.) 30 mL 5   Insulin Syringe-Needle U-100 (TRUEPLUS INSULIN SYRINGE) 31G X 5/16" 0.5 ML MISC USE TO INJECT INSULIN 4  TIMES A DAY 300 each 3   metoprolol succinate (TOPROL-XL) 25 MG 24 hr tablet TAKE 1 TABLET (25 MG TOTAL) BY MOUTH DAILY. (Patient taking differently: Take 25 mg by mouth daily.) 90 tablet 1   lisinopril (ZESTRIL) 5 MG tablet TAKE 1 TABLET (5 MG TOTAL) BY MOUTH DAILY. (Patient taking differently: Take 5 mg by mouth daily.) 90 tablet 1   No facility-administered medications prior to visit.    Allergies  Allergen Reactions   Other Other (See Comments)    Client states he became very sick and had significant skin inflammation and irritation with the Freestyle Libre sensor    Review of Systems  Respiratory:  Positive for cough.   Cardiovascular:  Negative for leg swelling.  Neurological:  Positive for weakness.      Objective:    Physical Exam Constitutional:      General: He is not in acute distress.    Appearance: Normal appearance. He is not ill-appearing.  HENT:     Head: Normocephalic and atraumatic.     Right Ear: External ear normal.     Left Ear: External ear normal.  Eyes:     Extraocular Movements: Extraocular movements intact.     Pupils: Pupils are equal, round, and reactive to light.  Neck:     Thyroid: No thyromegaly.  Cardiovascular:     Rate and Rhythm: Normal rate and regular rhythm.     Heart sounds: Normal heart sounds. No murmur heard.   No gallop.  Pulmonary:     Effort: Pulmonary effort is normal. No respiratory distress.     Breath sounds: Normal breath sounds. No wheezing or rales.  Lymphadenopathy:     Cervical: No cervical adenopathy.  Skin:    General: Skin is warm and dry.  Neurological:     Mental Status: He is alert and oriented to person, place, and time.  Psychiatric:        Mood and Affect: Mood normal.        Behavior: Behavior normal.        Judgment: Judgment normal.    BP 113/69 (BP Location: Right Arm, Patient Position: Sitting, Cuff Size: Small)   Pulse 70   Temp 98.3 F (36.8 C) (Oral)   Resp 16   Wt 158 lb (71.7 kg)   SpO2  98%   BMI 23.33 kg/m  Wt Readings from Last 3 Encounters:  11/03/21 158 lb (71.7 kg)  10/29/21 162 lb 11.2 oz (73.8 kg)  10/07/21 167 lb 6.4 oz (75.9 kg)       Assessment & Plan:   Problem List Items Addressed This Visit       Unprioritized   Interstitial lung disease ([place])    Suspected on CXR. Will obtain CT chest for further evaluation. Due to c/o ongoing dry cough, will send tessalon prn and d/c lisinopril. Consider referral to pulmonology following review of CT scan.       Relevant Orders   CT Chest Wo Contrast   CBC with Differential/Platelet   HTN (hypertension)    BP Readings from Last 3 Encounters:  11/03/21 113/69  10/29/21 129/80  10/07/21 (!) 148/68  BP stable. Continue metoprolol, d/c low dose lisinopril (see below).       Controlled diabetes mellitus type 2 with complications [facility])    Lab Results  Component Value Date   HGBA1C 7.2 (H) 10/27/2021   HGBA1C 6.9 (A) 06/30/2021   HGBA1C 6.5 (A) 02/26/2021   Lab Results  Component Value Date   MICROALBUR 0.9 10/09/2019   LDLCALC 73 01/23/2021   CREATININE 0.93 10/29/2021  A1C was actually not too bad despite his episode of hyperglycemia upon admission. He will schedule follow up with his endocrinologist.       Chronic diastolic CHF (congestive heart failure) ([place])    Wt Readings from Last 3 Encounters:  11/03/21 158 lb (71.7 kg)  10/29/21 162 lb 11.2 oz (73.8 kg)  10/07/21 167  lb 6.4 oz (75.9 kg)  He continues furosemide. Will check renal function today and determine if he should continue furosemide following review of these results.       AKI (acute kidney injury) ([place]) - Primary   Relevant Orders   Comp Met (CMET)      Meds ordered this encounter  Medications   benzonatate (TESSALON) 100 MG capsule    Sig: Take 1 capsule (100 mg total) by mouth 3 (three) times daily as needed.    Dispense:  20 capsule    Refill:  0    Order Specific Question:   Supervising Provider    Answer:   Penni Homans A [[id number]]    I, Nance Pear, NP, personally preformed the services described in this documentation.  All medical record entries made by the scribe were at my direction and in my presence.  I have reviewed the chart and discharge instructions (if applicable) and agree that the record reflects my personal performance and is accurate and complete. 11/03/2021   I,Amber Collins,acting as a Education administrator for Nance Pear, NP.,have documented all relevant documentation on the behalf of Nance Pear, NP,as directed by  Nance Pear, NP while in the presence of Nance Pear, NP.    Nance Pear, NP

## 2021-11-03 NOTE — Assessment & Plan Note (Addendum)
Wt Readings from Last 3 Encounters:  ?11/03/21 158 lb (71.7 kg)  ?10/29/21 162 lb 11.2 oz (73.8 kg)  ?10/07/21 167 lb 6.4 oz (75.9 kg)  ? ?He continues furosemide. Will check renal function today and determine if he should continue furosemide following review of these results.  ?

## 2021-11-03 NOTE — Assessment & Plan Note (Addendum)
Lab Results  ?Component Value Date  ? HGBA1C 7.2 (H) 10/27/2021  ? HGBA1C 6.9 (A) 06/30/2021  ? HGBA1C 6.5 (A) 02/26/2021  ? ?Lab Results  ?Component Value Date  ? MICROALBUR 0.9 10/09/2019  ? LDLCALC 73 01/23/2021  ? CREATININE 0.93 10/29/2021  ? ?A1C was actually not too bad despite his episode of hyperglycemia upon admission. He will schedule follow up with his endocrinologist.  ?

## 2021-11-03 NOTE — Assessment & Plan Note (Signed)
Suspected on CXR. Will obtain CT chest for further evaluation. Due to c/o ongoing dry cough, will send tessalon prn and d/c lisinopril. Consider referral to pulmonology following review of CT scan.  ?

## 2021-11-03 NOTE — Patient Instructions (Addendum)
Stop lisinopril. ?You may use tessalon as needed for cough. Call if cough worsens or if it does not improve in the next 1-2 weeks.  ?Please complete lab work prior to leaving. ?You should be contacted about scheduling your lung CT scan.  ?

## 2021-11-03 NOTE — Assessment & Plan Note (Addendum)
BP Readings from Last 3 Encounters:  ?11/03/21 113/69  ?10/29/21 129/80  ?10/07/21 (!) 148/68  ? ?BP stable. Continue metoprolol, d/c low dose lisinopril (see below).  ?

## 2021-11-04 ENCOUNTER — Telehealth: Payer: Self-pay

## 2021-11-04 NOTE — Telephone Encounter (Signed)
Called and advised patient assistance Levemir (1 box) delivered to the office and ready for pick up. ?

## 2021-11-04 NOTE — Telephone Encounter (Signed)
Patient picked up Levemir, Novolog, and pen needles on 11/04/21 @ 146 PM - unable to note log in medicine closet because no log available ?

## 2021-11-09 ENCOUNTER — Telehealth (HOSPITAL_BASED_OUTPATIENT_CLINIC_OR_DEPARTMENT_OTHER): Payer: Self-pay

## 2021-11-13 ENCOUNTER — Ambulatory Visit (HOSPITAL_BASED_OUTPATIENT_CLINIC_OR_DEPARTMENT_OTHER)
Admission: RE | Admit: 2021-11-13 | Discharge: 2021-11-13 | Disposition: A | Discharge status: Home / Self Care | Payer: HMO | Source: Ambulatory Visit | Attending: Family | Admitting: Family

## 2021-11-13 DIAGNOSIS — J479 Bronchiectasis, uncomplicated: Secondary | ICD-10-CM | POA: Diagnosis not present

## 2021-11-13 DIAGNOSIS — J849 Interstitial pulmonary disease, unspecified: Secondary | ICD-10-CM | POA: Diagnosis not present

## 2021-11-13 DIAGNOSIS — I700 Atherosclerosis of aorta: Secondary | ICD-10-CM | POA: Diagnosis not present

## 2021-11-16 ENCOUNTER — Other Ambulatory Visit (HOSPITAL_BASED_OUTPATIENT_CLINIC_OR_DEPARTMENT_OTHER): Payer: Self-pay

## 2021-11-16 ENCOUNTER — Telehealth: Payer: Self-pay | Admitting: Family

## 2021-11-16 DIAGNOSIS — J849 Interstitial pulmonary disease, unspecified: Secondary | ICD-10-CM

## 2021-11-16 DIAGNOSIS — K7460 Unspecified cirrhosis of liver: Secondary | ICD-10-CM

## 2021-11-16 NOTE — Telephone Encounter (Signed)
Called Pt stated he read notes on mychart and  ?understand everything. ?

## 2021-11-16 NOTE — Telephone Encounter (Signed)
Please advise pt that his CT scan does show some chronic lung changes. I would like to refer him to pulmonology for further evaluation.  He should plan to repeat CT in 1 year.  ? ?Also, there is some scarring in his liver. I would like for him to see GI as well.  Both referrals have been pended.  ?

## 2021-11-17 ENCOUNTER — Encounter: Payer: Self-pay | Admitting: Nurse Practitioner

## 2021-11-18 NOTE — Telephone Encounter (Signed)
Yes I did

## 2021-11-20 ENCOUNTER — Other Ambulatory Visit (HOSPITAL_BASED_OUTPATIENT_CLINIC_OR_DEPARTMENT_OTHER): Payer: Self-pay

## 2021-11-25 ENCOUNTER — Telehealth: Payer: Self-pay

## 2021-11-25 DIAGNOSIS — Z79899 Other long term (current) drug therapy: Secondary | ICD-10-CM

## 2021-11-25 DIAGNOSIS — E785 Hyperlipidemia, unspecified: Secondary | ICD-10-CM

## 2021-11-25 NOTE — Telephone Encounter (Signed)
Pt to have fasting labs 11/30/21.  ?

## 2021-11-25 NOTE — Telephone Encounter (Signed)
-----   Message from Pricilla Riffle, MD sent at 11/16/2021  4:27 PM EDT ----- ?CT forwarded to me from [facility] ?Atherosclerosis noted    ?Pt needs a new lipid panel  Would get lipomed panel   ?

## 2021-11-26 NOTE — Telephone Encounter (Signed)
Pt contacted and advised patient assistance Novolog (2 boxes) and Pen needles (5 boxes) have been delivered and ready for pick up. Pt confirmed he will schedule follow up appt when he picks up medication. ?

## 2021-11-27 NOTE — Telephone Encounter (Signed)
Patient picked up all - noted chart, removed from PT fridge by Lyla Son  ?

## 2021-11-30 ENCOUNTER — Other Ambulatory Visit: Payer: HMO | Admitting: *Deleted

## 2021-11-30 DIAGNOSIS — Z79899 Other long term (current) drug therapy: Secondary | ICD-10-CM

## 2021-11-30 DIAGNOSIS — E785 Hyperlipidemia, unspecified: Secondary | ICD-10-CM | POA: Diagnosis not present

## 2021-12-01 ENCOUNTER — Other Ambulatory Visit: Payer: HMO

## 2021-12-01 ENCOUNTER — Ambulatory Visit (INDEPENDENT_AMBULATORY_CARE_PROVIDER_SITE_OTHER): Payer: HMO | Admitting: Family

## 2021-12-01 VITALS — BP 127/73 | HR 63 | Temp 97.9°F | Wt 162.0 lb

## 2021-12-01 DIAGNOSIS — E118 Type 2 diabetes mellitus with unspecified complications: Secondary | ICD-10-CM | POA: Diagnosis not present

## 2021-12-01 DIAGNOSIS — Z794 Long term (current) use of insulin: Secondary | ICD-10-CM | POA: Diagnosis not present

## 2021-12-01 DIAGNOSIS — I10 Essential (primary) hypertension: Secondary | ICD-10-CM

## 2021-12-01 DIAGNOSIS — K7460 Unspecified cirrhosis of liver: Secondary | ICD-10-CM | POA: Diagnosis not present

## 2021-12-01 DIAGNOSIS — R059 Cough, unspecified: Secondary | ICD-10-CM | POA: Diagnosis not present

## 2021-12-01 LAB — NMR, LIPOPROFILE
Cholesterol, Total: 151 mg/dL (ref 100–199)
HDL Particle Number: 30.3 umol/L — ABNORMAL LOW (ref >=30.5)
HDL-C: 57 mg/dL (ref >39)
LDL Particle Number: 805 nmol/L (ref <1000)
LDL Size: 20.7 nm (ref >20.5)
LDL-C (NIH Calc): 79 mg/dL (ref 0–99)
LP-IR Score: 30 (ref <=45)
Small LDL Particle Number: 293 nmol/L (ref <=527)
Triglycerides: 75 mg/dL (ref 0–149)

## 2021-12-01 LAB — LIPOPROTEIN A (LPA): Lipoprotein (a): 50.6 nmol/L (ref <75.0)

## 2021-12-01 LAB — APOLIPOPROTEIN B: Apolipoprotein B: 71 mg/dL (ref <90)

## 2021-12-01 NOTE — Progress Notes (Signed)
Subjective:   By signing my name below, I, Ryan [name], attest that this documentation has been prepared under the direction and in the presence of Ryan Alar NP, 12/01/2021    Patient ID: Ryan [name], male    DOB: February 15, 1946, 76 y.o.   MRN: [id number]  Chief Complaint  Patient presents with   Hypertension    Here for follow up after stopping lisinopril    HPI Patient is in today for an office visit.  Blood Pressure - As of today's visit, his blood pressure is fine.  BP Readings from Last 3 Encounters:  12/01/21 127/73  11/03/21 113/69  10/29/21 129/80   Gastro - He is scheduled to see a gastrointestinal specialist on 12/07/2021. He was reported to have scarring in his liver. He denies of any alcohol abuse.   A1C - His A1C levels are fine. He feels like his energy is improving/   Lab Results  Component Value Date   HGBA1C 7.2 (H) 10/27/2021   Cough - His coughing symptoms have been resolved.   Health Maintenance Due  Topic Date Due   COVID-19 Vaccine (4 - Booster for Pfizer series) 09/16/2020   URINE MICROALBUMIN  10/08/2020   OPHTHALMOLOGY EXAM  01/23/2021   TETANUS/TDAP  08/24/2021   Fecal DNA (Cologuard)  09/27/2021    Past Medical History:  Diagnosis Date   Allergy    Diabetes mellitus    GERD (gastroesophageal reflux disease)    History of chicken pox    Hyperlipidemia    Hypertension    Myocardial infarction ([place]) 11/12/2015   Type 2 diabetes mellitus with hyperglycemia, with long-term current use of insulin ([place]) 09/08/2015    Past Surgical History:  Procedure Laterality Date   EYE SURGERY  1998   bilateral   SHOULDER SURGERY  1990   right shoulder, torn rotator cuff.    Family History  Problem Relation Age of Onset   Cancer Mother        cancer   Heart disease Mother    Hyperlipidemia Mother    Hypertension Mother    Hyperlipidemia Father    Stroke Father    Hypertension Father    Diabetes Paternal Grandfather     Social History    Socioeconomic History   Marital status: Widowed    Spouse name: Not on file   Number of children: 1   Years of education: Not on file   Highest education level: Not on file  Occupational History   Occupation: retired    Comment: from retail  Tobacco Use   Smoking status: Former    Types: Cigarettes    Quit date: 09/01/1965    Years since quitting: 56.2   Smokeless tobacco: Never  Vaping Use   Vaping Use: Never used  Substance and Sexual Activity   Alcohol use: No   Drug use: No   Sexual activity: Never  Other Topics Concern   Not on file  Social History Narrative   Regular exercise:  5 x weekly   Caffeine Use: 1 cups coffee daily.   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   Son, daughter in law and young adult granddaughter and grandson live with client   Wife died from ovarian cancer in 12-Nov-2014         Social Determinants of Health   Financial Resource Strain: Medium Risk   Difficulty of Paying Living Expenses: Somewhat hard  Food Insecurity: No Food Insecurity   Worried About Charity fundraiser in the  Last Year: Never true   Ran Out of Food in the Last Year: Never true  Transportation Needs: No Transportation Needs   Lack of Transportation (Medical): No   Lack of Transportation (Non-Medical): No  Physical Activity: Insufficiently Active   Days of Exercise per Week: 4 days   Minutes of Exercise per Session: 10 min  Stress: No Stress Concern Present   Feeling of Stress : Not at all  Social Connections: Socially Isolated   Frequency of Communication with Friends and Family: More than three times a week   Frequency of Social Gatherings with Friends and Family: Once a week   Attends Religious Services: Never   Marine scientist or Organizations: No   Attends Archivist Meetings: Never   Marital Status: Widowed  Human resources officer Violence: Not At Risk   Fear of Current or Ex-Partner: No   Emotionally Abused: No   Physically Abused: No   Sexually Abused: No     Outpatient Medications Prior to Visit  Medication Sig Dispense Refill   apixaban (ELIQUIS) 5 MG TABS tablet TAKE 1 TABLET BY MOUTH TWICE DAILY (Patient taking differently: Take 5 mg by mouth 2 (two) times daily.) 60 tablet 5   atorvastatin (LIPITOR) 40 MG tablet TAKE 1 TABLET (40 MG TOTAL) BY MOUTH DAILY. (Patient taking differently: Take 40 mg by mouth at bedtime.) 90 tablet 1   benzonatate (TESSALON) 100 MG capsule Take 1 capsule (100 mg total) by mouth 3 (three) times daily as needed. 20 capsule 0   blood glucose meter kit and supplies by Other route See admin instructions. Check blood sugar 6-7 times daily     Calcium Carbonate-Vitamin D 600-400 MG-UNIT tablet Take 1 tablet by mouth 2 (two) times daily. Reported on 01/27/2016     cholecalciferol (VITAMIN D3) 25 MCG (1000 UNIT) tablet Take 1,000 Units by mouth daily.     furosemide (LASIX) 20 MG tablet TAKE 1 TABLET (20 MG TOTAL) BY MOUTH DAILY. (Patient taking differently: Take 20 mg by mouth daily.) 90 tablet 1   Glucagon, rDNA, (GLUCAGON EMERGENCY) 1 MG KIT Inject 1 mg into the muscle once as needed for up to 1 dose. (Patient taking differently: Inject 1 mg into the muscle as needed (blood sugar below 45).) 1 kit 12   insulin aspart (NOVOLOG) 100 UNIT/ML injection INJECT 5-6 UNITS INTO THE SKIN 3 TIMES A DAY WITH MEALS (Patient taking differently: Inject 0-10 Units into the skin See admin instructions. Per sliding scale 3 times daily) 30 mL 5   insulin detemir (LEVEMIR) 100 UNIT/ML injection INJECT 0.12 ML (12 UNITS) INTO THE SKIN AT BEDTIME (Patient taking differently: Inject 12 Units into the skin at bedtime.) 30 mL 5   Insulin Syringe-Needle U-100 (TRUEPLUS INSULIN SYRINGE) 31G X 5/16" 0.5 ML MISC USE TO INJECT INSULIN 4 TIMES A DAY 300 each 3   metoprolol succinate (TOPROL-XL) 25 MG 24 hr tablet TAKE 1 TABLET (25 MG TOTAL) BY MOUTH DAILY. (Patient taking differently: Take 25 mg by mouth daily.) 90 tablet 1   No facility-administered  medications prior to visit.    Allergies  Allergen Reactions   Other Other (See Comments)    Client states he became very sick and had significant skin inflammation and irritation with the Freestyle Libre sensor    Review of Systems  Respiratory:  Negative for cough.       Objective:    Physical Exam Constitutional:      General: He is not in  acute distress.    Appearance: Normal appearance. He is not ill-appearing.  HENT:     Head: Normocephalic and atraumatic.     Right Ear: External ear normal.     Left Ear: External ear normal.  Eyes:     Extraocular Movements: Extraocular movements intact.     Pupils: Pupils are equal, round, and reactive to light.  Cardiovascular:     Rate and Rhythm: Normal rate and regular rhythm.     Heart sounds: Normal heart sounds. No murmur heard.   No gallop.  Pulmonary:     Effort: Pulmonary effort is normal. No respiratory distress.     Breath sounds: Normal breath sounds. No wheezing or rales.  Skin:    General: Skin is warm and dry.  Neurological:     Mental Status: He is alert and oriented to person, place, and time.  Psychiatric:        Mood and Affect: Mood normal.        Behavior: Behavior normal.        Judgment: Judgment normal.    BP 127/73 Comment: This AM at home  Pulse 63   Temp 97.9 F (36.6 C) (Oral)   Wt 162 lb (73.5 kg)   BMI 23.92 kg/m  Wt Readings from Last 3 Encounters:  12/01/21 162 lb (73.5 kg)  11/03/21 158 lb (71.7 kg)  10/29/21 162 lb 11.2 oz (73.8 kg)       Assessment & Plan:   Problem List Items Addressed This Visit       Unprioritized   HTN (hypertension)    Home BP OK.  Continue metoprolol.        Cough - Primary    Resolved off of lisinopril. Monitor.        Controlled diabetes mellitus type 2 with complications [facility])    Lab Results  Component Value Date   HGBA1C 7.2 (H) 10/27/2021   HGBA1C 6.9 (A) 06/30/2021   HGBA1C 6.5 (A) 02/26/2021   Lab Results  Component Value Date    MICROALBUR 0.9 10/09/2019   LDLCALC 73 01/23/2021   CREATININE 0.94 11/03/2021  Stable. Pt will schedule a follow up visit with Endocrinology.        Cirrhosis of liver without ascites ([place])    Incidental finding on CT. Has a consult scheduled with GI.          26 minutes spent on today's visit. Time was spent reviewing medical record and counseling patient on blood pressure/diabetes.   No orders of the defined types were placed in this encounter.   I, Nance Pear, NP, personally preformed the services described in this documentation.  All medical record entries made by the scribe were at my direction and in my presence.  I have reviewed the chart and discharge instructions (if applicable) and agree that the record reflects my personal performance and is accurate and complete. 12/01/2021   I,Amber Collins,acting as a scribe for Nance Pear, NP.,have documented all relevant documentation on the behalf of Nance Pear, NP,as directed by  Nance Pear, NP while in the presence of Nance Pear, NP.    Nance Pear, NP

## 2021-12-01 NOTE — Assessment & Plan Note (Signed)
Resolved off of lisinopril. Monitor.  ?

## 2021-12-01 NOTE — Assessment & Plan Note (Signed)
Home BP OK.  Continue metoprolol.  ?

## 2021-12-01 NOTE — Assessment & Plan Note (Signed)
Lab Results  ?Component Value Date  ? HGBA1C 7.2 (H) 10/27/2021  ? HGBA1C 6.9 (A) 06/30/2021  ? HGBA1C 6.5 (A) 02/26/2021  ? ?Lab Results  ?Component Value Date  ? MICROALBUR 0.9 10/09/2019  ? [place] 73 01/23/2021  ? CREATININE 0.94 11/03/2021  ? ?Stable. Pt will schedule a follow up visit with Endocrinology.  ? ?

## 2021-12-01 NOTE — Assessment & Plan Note (Signed)
Incidental finding on CT. Has a consult scheduled with GI.   ?

## 2021-12-07 ENCOUNTER — Other Ambulatory Visit (INDEPENDENT_AMBULATORY_CARE_PROVIDER_SITE_OTHER): Payer: HMO

## 2021-12-07 ENCOUNTER — Ambulatory Visit (INDEPENDENT_AMBULATORY_CARE_PROVIDER_SITE_OTHER): Payer: HMO | Admitting: Nurse Practitioner

## 2021-12-07 ENCOUNTER — Encounter: Payer: Self-pay | Admitting: Nurse Practitioner

## 2021-12-07 VITALS — BP 124/78 | HR 64 | Ht 69.0 in | Wt 161.2 lb

## 2021-12-07 DIAGNOSIS — K7460 Unspecified cirrhosis of liver: Secondary | ICD-10-CM

## 2021-12-07 DIAGNOSIS — Z794 Long term (current) use of insulin: Secondary | ICD-10-CM | POA: Diagnosis not present

## 2021-12-07 DIAGNOSIS — E118 Type 2 diabetes mellitus with unspecified complications: Secondary | ICD-10-CM

## 2021-12-07 LAB — HEPATIC FUNCTION PANEL
ALT: 16 U/L (ref 0–53)
AST: 18 U/L (ref 0–37)
Albumin: 4.4 g/dL (ref 3.5–5.2)
Alkaline Phosphatase: 86 U/L (ref 39–117)
Bilirubin, Direct: 0.3 mg/dL (ref 0.0–0.3)
Total Bilirubin: 1.6 mg/dL — ABNORMAL HIGH (ref 0.2–1.2)
Total Protein: 8 g/dL (ref 6.0–8.3)

## 2021-12-07 LAB — CBC
HCT: 43.9 % (ref 39.0–52.0)
Hemoglobin: 14.6 g/dL (ref 13.0–17.0)
MCHC: 33.3 g/dL (ref 30.0–36.0)
MCV: 91.6 fl (ref 78.0–100.0)
Platelets: 222 10*3/uL (ref 150.0–400.0)
RBC: 4.8 Mil/uL (ref 4.22–5.81)
RDW: 14 % (ref 11.5–15.5)
WBC: 7.4 10*3/uL (ref 4.0–10.5)

## 2021-12-07 LAB — PROTIME-INR
INR: 1.2 ratio — ABNORMAL HIGH (ref 0.8–1.0)
Prothrombin Time: 13.3 s — ABNORMAL HIGH (ref 9.6–13.1)

## 2021-12-07 LAB — BASIC METABOLIC PANEL
BUN: 14 mg/dL (ref 6–23)
CO2: 26 mEq/L (ref 19–32)
Calcium: 9.8 mg/dL (ref 8.4–10.5)
Chloride: 102 mEq/L (ref 96–112)
Creatinine, Ser: 0.94 mg/dL (ref 0.40–1.50)
GFR: 79.26 mL/min (ref >60.00)
Glucose, Bld: 111 mg/dL — ABNORMAL HIGH (ref 70–99)
Potassium: 4.4 mEq/L (ref 3.5–5.1)
Sodium: 138 mEq/L (ref 135–145)

## 2021-12-07 LAB — FERRITIN: Ferritin: 83.9 ng/mL (ref 22.0–322.0)

## 2021-12-07 LAB — IRON: Iron: 123 ug/dL (ref 42–165)

## 2021-12-07 NOTE — Patient Instructions (Addendum)
Please proceed to the basement level for lab work before leaving today. Press "B" on the elevator. The lab is located at the first door on the left as you exit the elevator. ? ?HEALTHCARE LAWS AND MY CHART RESULTS:  ? ?Due to recent changes in healthcare laws, you may see results of your imaging and/or laboratory studies on MyChart before I have had a chance to review them.  I understand that in some cases there may be results that are confusing or concerning to you. Please understand that not all results are received at the same time and often I may need to interpret multiple results in order to provide you with the best plan of care or course of treatment. Therefore, I ask that you please give me 48 hours to thoroughly review all your results before contacting my office for clarification.  ? ?You will be contacted by [facility] Scheduling (Your caller ID will indicate phone # [phone]) in the next 7 days to schedule your Abdominal Ultrasound. If you have not heard from them within 7 business days, please call [facility] Scheduling at [phone] to follow up on the status of your appointment.   ? ?We have scheduled you a follow up with Dr. Marina Goodell on 01/18/22 at 3:20 pm. ? ?Please Avoid all NSAID's. Some examples of NSAID's are as follows: ?Aspirin (Bufferin, Bayer, and Excedrin) ?Ibuprofen (Advil, Motrin, Nuprin) ?Ketoprofen (Actron, Orudis) ?Naproxen (Aleve) ?Daypro  ?Indocin  ?Lodine  ?Naprosyn  ?Relafen  ?Vimovo ?Voltaren ? ?Thank you for trusting me with your gastrointestinal care!   ? ?Arnaldo Natal, CRNP ? ? ? ?BMI: ? ?If you are age 90 or older, your body mass index should be between 23-30. Your Body mass index is 23.81 kg/m?Marland Kitchen If this is out of the aforementioned range listed, please consider follow up with your Primary Care Provider. ? ?If you are age 47 or younger, your body mass index should be between 19-25. Your Body mass index is 23.81 kg/m?Marland Kitchen If this is out of the  aformentioned range listed, please consider follow up with your Primary Care Provider.  ? ?MY CHART: ? ?The [facility] providers would like to encourage you to use [facility] to communicate with providers for non-urgent requests or questions.  Due to long hold times on the telephone, sending your provider a message by [facility] may be a faster and more efficient way to get a response.  Please allow 48 business hours for a response.  Please remember that this is for non-urgent requests.  ? ?

## 2021-12-07 NOTE — Progress Notes (Signed)
Noted  

## 2021-12-07 NOTE — Progress Notes (Signed)
12/07/2021 Ryan [name] [id number] [birth year]   CHIEF COMPLAINT: Cirrhosis   HISTORY OF PRESENT ILLNESS: Ryan [name] is a 76 year old male with a past medical history of arthritis, hypertension, coronary artery disease, MI 2017, atrial flutter on Eliquis, hyperlipidemia, diabetes mellitus type 2, GERD and cirrhosis per CT imaging.  He presents to our office today as referred by [name] NP for further evaluation regarding possible cirrhosis.  He underwent a chest CT 11/13/2021 to further evaluate an abnormal chest x-ray which identified evidence of interstitial lung disease but incidentally identified a shrunken appearance to the liver with a nodular contour concerning for cirrhosis.  Labs 11/03/2021 showed normal LFTs and a normal platelet count.  He denies having any known history of liver disease.  No family history of liver disease.  He denies any past history of alcohol use disorder.  No alcohol since 1969.  He denies having any issues with confusion.  No nausea or vomiting.  No GERD symptoms.  No upper or lower abdominal pain.  He typically passes a normal formed brown bowel movement daily.  No rectal bleeding or black stools.  He underwent a colonoscopy by Dr.  04/13/2000 which identified diverticulosis to the transverse, descending and sigmoid colon.  No colon polyps.  He reported completing 2 Cologuard tests were negative, the most recent  Cologuard test was done 09/2018.  No known family history of esophageal, gastric or colon cancer.  He was admitted to the hospital 3/21 - 10/29/2021 secondary to poorly controlled diabetes, dehydration, AKI, Actiq acidosis and URI symptoms.  Troponin levels were elevated thought to be due to demand ischemia.  He received IV fluids and insulin and his glucose level, lactic acid and creatinine levels improved.  A chest x-ray showed coarse interstitial thickening.  Past Medical History:  Diagnosis Date   Allergy    Arthritis    Diabetes mellitus     GERD (gastroesophageal reflux disease)    History of chicken pox    Hyperlipidemia    Hypertension    Myocardial infarction ([place]) 2017   Type 2 diabetes mellitus with hyperglycemia, with long-term current use of insulin ([place]) 09/08/2015   Past Surgical History:  Procedure Laterality Date   EYE SURGERY  1998   bilateral   SHOULDER SURGERY  1990   right shoulder, torn rotator cuff.        Latest Ref Rng & Units 11/03/2021    9:22 AM 10/29/2021    1:31 AM 10/28/2021    4:49 AM  CBC  WBC 4.0 - 10.5 K/uL 6.9   11.3   11.8    Hemoglobin 13.0 - 17.0 g/dL 13.8   12.1   11.8    Hematocrit 39.0 - 52.0 % 42.1   35.4   36.9    Platelets 150.0 - 400.0 K/uL 254.0   217   235         Latest Ref Rng & Units 11/03/2021    9:22 AM 10/29/2021    1:31 AM 10/28/2021    4:49 AM  CMP  Glucose 70 - 99 mg/dL 214   201   264    BUN 6 - 23 mg/dL '14   20   27    '$ Creatinine 0.40 - 1.50 mg/dL 0.94   0.93   1.37    Sodium 135 - 145 mEq/L 135   135   133    Potassium 3.5 - 5.1 mEq/L 4.2   4.0  4.6    Chloride 96 - 112 mEq/L 99   106   104    CO2 19 - 32 mEq/L '30   25   16    '$ Calcium 8.4 - 10.5 mg/dL 9.5   8.2   8.8    Total Protein 6.0 - 8.3 g/dL 6.8    6.0    Total Bilirubin 0.2 - 1.2 mg/dL 1.0    2.0    Alkaline Phos 39 - 117 U/L 76    71    AST 0 - 37 U/L 22    20    ALT 0 - 53 U/L 24    18      Social History: He is widowed, his wife died from ovarian cancer. 1969 No alcohol. No drug use. No cigarettes x 56 years.   Family History: Paternal grandfather had diabetes. Mom had  Leaky heart valve. Father had a stroke.  No known family history of esophageal, gastric or colon cancer.   Allergies  Allergen Reactions   Other Other (See Comments)    Client states he became very sick and had significant skin inflammation and irritation with the [facility] sensor      Outpatient Encounter Medications as of 12/07/2021  Medication Sig   apixaban (ELIQUIS) 5 MG TABS tablet TAKE 1 TABLET BY MOUTH  TWICE DAILY (Patient taking differently: Take 5 mg by mouth 2 (two) times daily.)   atorvastatin (LIPITOR) 40 MG tablet TAKE 1 TABLET (40 MG TOTAL) BY MOUTH DAILY. (Patient taking differently: Take 40 mg by mouth at bedtime.)   blood glucose meter kit and supplies by Other route See admin instructions. Check blood sugar 6-7 times daily   Calcium Carbonate-Vitamin D 600-400 MG-UNIT tablet Take 1 tablet by mouth 2 (two) times daily. Reported on 01/27/2016   cholecalciferol (VITAMIN D3) 25 MCG (1000 UNIT) tablet Take 1,000 Units by mouth daily.   Glucagon, rDNA, (GLUCAGON EMERGENCY) 1 MG KIT Inject 1 mg into the muscle once as needed for up to 1 dose. (Patient taking differently: Inject 1 mg into the muscle as needed (blood sugar below 45).)   insulin aspart (NOVOLOG) 100 UNIT/ML injection INJECT 5-6 UNITS INTO THE SKIN 3 TIMES A DAY WITH MEALS (Patient taking differently: Inject 0-10 Units into the skin See admin instructions. Per sliding scale 3 times daily)   insulin detemir (LEVEMIR) 100 UNIT/ML injection INJECT 0.12 ML (12 UNITS) INTO THE SKIN AT BEDTIME (Patient taking differently: Inject 12 Units into the skin at bedtime.)   Insulin Syringe-Needle U-100 (TRUEPLUS INSULIN SYRINGE) 31G X 5/16" 0.5 ML MISC USE TO INJECT INSULIN 4 TIMES A DAY   metoprolol succinate (TOPROL-XL) 25 MG 24 hr tablet TAKE 1 TABLET (25 MG TOTAL) BY MOUTH DAILY. (Patient taking differently: Take 25 mg by mouth daily.)   [DISCONTINUED] benzonatate (TESSALON) 100 MG capsule Take 1 capsule (100 mg total) by mouth 3 (three) times daily as needed.   [DISCONTINUED] furosemide (LASIX) 20 MG tablet TAKE 1 TABLET (20 MG TOTAL) BY MOUTH DAILY. (Patient taking differently: Take 20 mg by mouth daily.)   No facility-administered encounter medications on file as of 12/07/2021.    REVIEW OF SYSTEMS:  Gen: Denies fever, sweats or chills. No weight loss.  CV: Denies chest pain, palpitations or edema. Resp: Denies cough, shortness of breath  of hemoptysis.  GI: Denies heartburn, dysphagia, stomach or lower abdominal pain. No diarrhea or constipation.  GU : Denies urinary burning, blood in urine, increased urinary frequency  or incontinence. MS: Denies joint pain, muscles aches or weakness. Derm: Denies rash, itchiness, skin lesions or unhealing ulcers. Psych: Denies depression, anxiety, memory loss, suicidal ideation and confusion. Heme: Denies bruising, easy bleeding. Neuro:  Denies headaches, dizziness or paresthesias. Endo:  Denies any problems with DM, thyroid or adrenal function.  PHYSICAL EXAM: BP 124/78   Pulse 64   Ht '5\' 9"'$  (1.753 m)   Wt 161 lb 3.2 oz (73.1 kg)   SpO2 96%   BMI 23.81 kg/m  General: 76 year old male in no acute distress Head: Normocephalic and atraumatic. Eyes:  Sclerae non-icteric, conjunctive pink. Ears: Normal auditory acuity. Mouth: Poor dentition. No ulcers or lesions.  Neck: Supple, no lymphadenopathy or thyromegaly.  Lungs: Clear bilaterally to auscultation without wheezes, crackles or rhonchi. Heart: Regular rate and rhythm. No murmur, rub or gallop appreciated.  Abdomen: Soft, nontender, non distended. No masses. No hepatosplenomegaly. Normoactive bowel sounds x 4 quadrants.  Rectal:  Musculoskeletal: Symmetrical with no gross deformities. Skin: Warm and dry. No rash or lesions on visible extremities. Extremities: No edema. Neurological: Alert oriented x 4, no focal deficits.  Psychological:  Alert and cooperative. Normal mood and affect.  ASSESSMENT AND PLAN:  59) 76 year old male with evidence of cirrhosis per chest CT imaging 11/13/2021. No prior history of liver disease. Normal LFTs. Normal platelet count.  -Complete abdominal sonogram to evaluate the liver and spleen -Hepatitis A total antibody, hepatitis B surface antigen, hepatitis B surface antibody, hepatitis B core total antibody, hepatitis C antibody, ANA, AMA, SMA, IgG, alpha 1 antitrypsin, PT/INR, AFP, iron and ferritin  level. -Consider an EGD to survey for esophageal varices if abdominal sonogram shows further evidence of cirrhosis -No NSAIDs -Patient to follow-up in the office in 4 to 6 weeks  2) Interstitial lung disease  3) DM II, poorly controlled   4) Colon cancer screening.  Colonoscopy 04/13/2000 identified diverticulosis no colon polyps.  Patient reported undergoing 2 Cologuard tests which were negative, the last Cologuard test was in 2020. -Consider repeat Cologuard to discuss further at the time of his follow-up appointment  5) CHF, no longer on Furosemide   6) Atrial flutter on Eliquis and Toprol-XL   CC:  [name], NP

## 2021-12-12 LAB — HEPATITIS C ANTIBODY
Hepatitis C Ab: NONREACTIVE
SIGNAL TO CUT-OFF: 0.04 (ref <1.00)

## 2021-12-12 LAB — ANTI-SMOOTH MUSCLE ANTIBODY, IGG: Actin (Smooth Muscle) Antibody (IGG): <20 U (ref <20)

## 2021-12-12 LAB — HEPATITIS A ANTIBODY, TOTAL: Hepatitis A AB,Total: REACTIVE — AB

## 2021-12-12 LAB — AFP TUMOR MARKER: AFP-Tumor Marker: 2.6 ng/mL (ref <6.1)

## 2021-12-12 LAB — HEPATITIS B SURFACE ANTIGEN: Hepatitis B Surface Ag: NONREACTIVE

## 2021-12-12 LAB — MITOCHONDRIAL ANTIBODIES: Mitochondrial M2 Ab, IgG: <=20 U (ref <=20.0)

## 2021-12-12 LAB — ALPHA-1-ANTITRYPSIN: A-1 Antitrypsin, Ser: 142 mg/dL (ref 83–199)

## 2021-12-12 LAB — ANA: Anti Nuclear Antibody (ANA): NEGATIVE

## 2021-12-12 LAB — HEPATITIS B SURFACE ANTIBODY,QUALITATIVE: Hep B S Ab: NONREACTIVE

## 2021-12-12 LAB — HEPATITIS B CORE ANTIBODY, TOTAL: Hep B Core Total Ab: NONREACTIVE

## 2021-12-12 LAB — IGG: IgG (Immunoglobin G), Serum: 1149 mg/dL (ref 600–1540)

## 2021-12-17 ENCOUNTER — Ambulatory Visit (HOSPITAL_COMMUNITY)
Admission: RE | Admit: 2021-12-17 | Discharge: 2021-12-17 | Disposition: A | Discharge status: Home / Self Care | Payer: HMO | Source: Ambulatory Visit | Attending: Nurse Practitioner | Admitting: Nurse Practitioner

## 2021-12-17 DIAGNOSIS — K7460 Unspecified cirrhosis of liver: Secondary | ICD-10-CM | POA: Diagnosis not present

## 2021-12-17 DIAGNOSIS — E118 Type 2 diabetes mellitus with unspecified complications: Secondary | ICD-10-CM | POA: Diagnosis not present

## 2021-12-17 DIAGNOSIS — K7689 Other specified diseases of liver: Secondary | ICD-10-CM | POA: Diagnosis not present

## 2021-12-17 DIAGNOSIS — Z794 Long term (current) use of insulin: Secondary | ICD-10-CM

## 2021-12-17 DIAGNOSIS — R7989 Other specified abnormal findings of blood chemistry: Secondary | ICD-10-CM | POA: Diagnosis not present

## 2021-12-18 ENCOUNTER — Encounter: Payer: Self-pay | Admitting: Internal Medicine

## 2021-12-18 ENCOUNTER — Ambulatory Visit (INDEPENDENT_AMBULATORY_CARE_PROVIDER_SITE_OTHER): Payer: HMO | Admitting: Internal Medicine

## 2021-12-18 VITALS — BP 148/92 | HR 70 | Ht 69.0 in | Wt 164.8 lb

## 2021-12-18 DIAGNOSIS — E663 Overweight: Secondary | ICD-10-CM

## 2021-12-18 DIAGNOSIS — E785 Hyperlipidemia, unspecified: Secondary | ICD-10-CM | POA: Diagnosis not present

## 2021-12-18 DIAGNOSIS — E1165 Type 2 diabetes mellitus with hyperglycemia: Secondary | ICD-10-CM

## 2021-12-18 DIAGNOSIS — E1159 Type 2 diabetes mellitus with other circulatory complications: Secondary | ICD-10-CM | POA: Diagnosis not present
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
## 2021-12-18 NOTE — Patient Instructions (Addendum)
Please continue: ?- Levemir 12 units at bedtime ? ?Decrease: ?- Novolog mealtime 2-4 units 3x a day before meals  ? ?Please return in 4-6 months with your sugar log.   ?

## 2021-12-21 ENCOUNTER — Ambulatory Visit: Payer: HMO | Admitting: Internal Medicine

## 2021-12-21 ENCOUNTER — Encounter: Payer: Self-pay | Admitting: Internal Medicine

## 2021-12-21 ENCOUNTER — Telehealth: Payer: Self-pay | Admitting: Internal Medicine

## 2021-12-21 VITALS — BP 124/70 | HR 65 | Temp 98.2°F | Ht 69.0 in | Wt 162.4 lb

## 2021-12-21 DIAGNOSIS — R0989 Other specified symptoms and signs involving the circulatory and respiratory systems: Secondary | ICD-10-CM

## 2021-12-21 DIAGNOSIS — J849 Interstitial pulmonary disease, unspecified: Secondary | ICD-10-CM

## 2021-12-21 DIAGNOSIS — I5032 Chronic diastolic (congestive) heart failure: Secondary | ICD-10-CM

## 2021-12-21 DIAGNOSIS — I4892 Unspecified atrial flutter: Secondary | ICD-10-CM

## 2021-12-21 LAB — SEDIMENTATION RATE: Sed Rate: 33 mm/hr — ABNORMAL HIGH (ref 0–20)

## 2021-12-21 NOTE — Patient Instructions (Signed)
ICD-10-CM   ?1. ILD (interstitial lung disease) ([place])  J84.9   ?  ?2. Bibasilar crackles  R09.89   ?  ? ? ? - I am concerned you  have Interstitial Lung Disease (ILD) aka Pulmonary Fibrosis ? -  There are MANY varieties of this ?- To narrow down possibilities and assess severity please do the following tests ? - do full PFT ? - do walking test on room air in the office (not 6 min walk test) ?  - 12/21/2021 OR Next visit ? -- do autoimmune panel: Serum: ESR, DS-DNA, RF, anti-CCP, ssA, ssB, scl-70,Total CK,  Aldolase,  Hypersensitivity Pneumonitis Panel ? - take home the ILD question packet -> fill it -> bring it back ? ?Followup ? - next few to several week swith DR Chase Caller - 30 min visit ? - to determine IPF v non-IPF and if you need biopsy or not ? - and to discuss possible treatment options ? ?

## 2021-12-21 NOTE — Progress Notes (Signed)
OV 12/21/2021  Subjective:  Patient ID: Ryan [name], male , DOB: Jun 08, 1946 , age 76 y.o. , MRN: [id number] , ADDRESS: [address] [place] [id number] PCP [name], NP Patient Care Team: [name], NP as PCP - General (Internal Medicine) Fay Records, MD as PCP - Cardiology (Cardiology) [name], MD as Consulting Physician (Endocrinology) Fay Records, MD as Consulting Physician (Cardiology) Cherre Robins, [place] (Pharmacist) Luretha Rued, RN as Case Manager  This Provider for this visit: Treatment Team:  Attending Provider: Brand Males, MD    12/21/2021 -   Chief Complaint  Patient presents with   Consult    Pt had recent CT performed which is the reason for today's visit.  Pt denies any current complaints of cough, SOB, chest discomfort.     HPI Ryan [name] 76 y.o. -referred because of abnormal CT scan of the chest concern for interstitial lung disease.  He is a widower.  He lives alone in [place] area.  He used to work at [employer] but then retired after a right shoulder injury.  He says he quit cigarettes in 1967 and quit alcohol in 1969.  However he has been diagnosed with cirrhosis of the liver.  He sees Carl Best at [facility].  This was diagnosed/7/23 during incidental finding of a CT chest when his liver showed nodular appearance and current signs of cirrhosis.  March 2023 had normal liver function test and platelet count.  2001 colonoscopy was normal.  Mid-to-late March 2023 got admitted for acute kidney injury acidosis and poorly controlled diabetes.  Other than that he has type 2 diabetes, atrial flutter   He has history of CHF not otherwise specified.  Last echo September 2021.   Creatinine 0.9 mg percent in May 2023.  Bilirubin 1.6 mg percent Dec 07, 2021 but normal in March.  Normal hemoglobin.  PFT      View : No data to display.          Latest Reference Range & Units 12/07/21 14:22   A-1 Antitrypsin, Ser 83 - 199 mg/dL 142  Anti Nuclear Antibody (ANA) NEGATIVE  NEGATIVE  Mitochondrial M2 Ab, IgG <=20.0 U <=20.0  IgG (Immunoglobin G), Serum 600 - 1,540 mg/dL 1,149  AFP Tumor Marker <6.1 ng/mL 2.6      has a past medical history of Allergy, Arthritis, Diabetes mellitus, GERD (gastroesophageal reflux disease), History of chicken pox, Hyperlipidemia, Hypertension, Myocardial infarction ([place]) (2017), and Type 2 diabetes mellitus with hyperglycemia, with long-term current use of insulin ([place]) (09/08/2015).   reports that he quit smoking about 56 years ago. His smoking use included cigarettes. He has never used smokeless tobacco.  Past Surgical History:  Procedure Laterality Date   EYE SURGERY  1998   bilateral   SHOULDER SURGERY  1990   right shoulder, torn rotator cuff.    Allergies  Allergen Reactions   Other Other (See Comments)    Client states he became very sick and had significant skin inflammation and irritation with the Freestyle Libre sensor    Immunization History  Administered Date(s) Administered   Fluad Quad(high Dose 65+) 05/15/2019, 04/25/2020, 06/24/2021   H1N1 11/19/2008   Influenza Split 05/25/2011, 04/20/2012   Influenza Whole 06/06/2009   Influenza, High Dose Seasonal PF 04/20/2016, 05/12/2017, 05/26/2018   Influenza,inj,Quad PF,6+ Mos 05/07/2013, 05/27/2014, 05/13/2015   Influenza-Unspecified 05/09/2004, 06/09/2005, 06/15/2006, 07/11/2007, 05/15/2008, 06/06/2009   PFIZER(Purple Top)SARS-COV-2 Vaccination 09/30/2019, 10/24/2019, 07/22/2020   Pneumococcal Conjugate-13  10/19/2004, 05/11/2013   Pneumococcal Polysaccharide-23 05/25/2011, 09/08/2018   Td 08/09/2001, 08/25/2011   Zoster Recombinat (Shingrix) 01/22/2021, 05/20/2021   Zoster, Live 08/25/2011    Family History  Problem Relation Age of Onset   Cancer Mother        cancer   Heart disease Mother    Hyperlipidemia Mother    Hypertension Mother    Hyperlipidemia Father     Stroke Father    Hypertension Father    Diabetes Paternal Grandfather    Colon cancer Neg Hx    Stomach cancer Neg Hx    Esophageal cancer Neg Hx      Current Outpatient Medications:    apixaban (ELIQUIS) 5 MG TABS tablet, TAKE 1 TABLET BY MOUTH TWICE DAILY (Patient taking differently: Take 5 mg by mouth 2 (two) times daily.), Disp: 60 tablet, Rfl: 5   atorvastatin (LIPITOR) 40 MG tablet, TAKE 1 TABLET (40 MG TOTAL) BY MOUTH DAILY. (Patient taking differently: Take 40 mg by mouth at bedtime.), Disp: 90 tablet, Rfl: 1   blood glucose meter kit and supplies, by Other route See admin instructions. Check blood sugar 6-7 times daily, Disp: , Rfl:    Calcium Carbonate-Vitamin D 600-400 MG-UNIT tablet, Take 1 tablet by mouth 2 (two) times daily. Reported on 01/27/2016, Disp: , Rfl:    cholecalciferol (VITAMIN D3) 25 MCG (1000 UNIT) tablet, Take 1,000 Units by mouth daily., Disp: , Rfl:    Glucagon, rDNA, (GLUCAGON EMERGENCY) 1 MG KIT, Inject 1 mg into the muscle once as needed for up to 1 dose. (Patient taking differently: Inject 1 mg into the muscle as needed (blood sugar below 45).), Disp: 1 kit, Rfl: 12   insulin aspart (NOVOLOG) 100 UNIT/ML injection, INJECT 5-6 UNITS INTO THE SKIN 3 TIMES A DAY WITH MEALS (Patient taking differently: Inject 0-10 Units into the skin See admin instructions. Per sliding scale 3 times daily), Disp: 30 mL, Rfl: 5   insulin detemir (LEVEMIR) 100 UNIT/ML injection, INJECT 0.12 ML (12 UNITS) INTO THE SKIN AT BEDTIME (Patient taking differently: Inject 12 Units into the skin at bedtime.), Disp: 30 mL, Rfl: 5   Insulin Syringe-Needle U-100 (TRUEPLUS INSULIN SYRINGE) 31G X 5/16" 0.5 ML MISC, USE TO INJECT INSULIN 4 TIMES A DAY, Disp: 300 each, Rfl: 3   metoprolol succinate (TOPROL-XL) 25 MG 24 hr tablet, TAKE 1 TABLET (25 MG TOTAL) BY MOUTH DAILY. (Patient taking differently: Take 25 mg by mouth daily.), Disp: 90 tablet, Rfl: 1      Objective:   Vitals:   12/21/21 1416   BP: 124/70  Pulse: 65  Temp: 98.2 F (36.8 C)  TempSrc: Oral  SpO2: 96%  Weight: 162 lb 6.4 oz (73.7 kg)  Height: '5\' 9"'$  (1.753 m)    Estimated body mass index is 23.98 kg/m as calculated from the following:   Height as of this encounter: '5\' 9"'$  (1.753 m).   Weight as of this encounter: 162 lb 6.4 oz (73.7 kg).  '@WEIGHTCHANGE'$ @  [employer]   12/21/21 1416  Weight: 162 lb 6.4 oz (73.7 kg)     Physical Exam    General: No distress. looks Neuro: Alert and Oriented x 3. GCS 15. Speech normal Psych: Pleasant Resp:  Barrel Chest - no.  Wheeze - no, Crackles - YES velcro at lung base, No overt respiratory distress CVS: Normal heart sounds. Murmurs - no Ext: Stigmata of Connective Tissue Disease - no HEENT: Normal upper airway. PEERL +. No post nasal drip  Assessment:       ICD-10-CM   1. ILD (interstitial lung disease) (HCC)  J84.9 Sedimentation rate    Anti-DNA antibody, double-stranded    Rheumatoid factor    Cyclic citrul peptide antibody, IgG    Sjogren's syndrome antibods(ssa + ssb)    CK total and CKMB (cardiac)not at [facility]    Aldolase    Hypersensitivity Pneumonitis    Anti-scleroderma antibody    Pulmonary function test    Anti-scleroderma antibody    Hypersensitivity Pneumonitis    Aldolase    CK total and CKMB (cardiac)not at [facility]    Sjogren's syndrome antibods(ssa + ssb)    Cyclic citrul peptide antibody, IgG    Rheumatoid factor    Anti-DNA antibody, double-stranded    Sedimentation rate    Hepatic function panel    2. Bibasilar crackles  R09.89 Hepatic function panel         Plan:     Patient Instructions     ICD-10-CM   1. ILD (interstitial lung disease) ([place])  J84.9     2. Bibasilar crackles  R09.89        - I am concerned you  have Interstitial Lung Disease (ILD) aka Pulmonary Fibrosis  -  There are MANY varieties of this - To narrow down possibilities and assess severity please do the following tests  - do full PFT  -  do walking test on room air in the office (not 6 min walk test)   - 12/21/2021 OR Next visit  -- do autoimmune panel: Serum: ESR, DS-DNA, RF, anti-CCP, ssA, ssB, scl-70,Total CK,  Aldolase,  Hypersensitivity Pneumonitis Panel  - take home the ILD question packet -> fill it -> bring it back  Followup  - next few to several week swith DR Chase Caller - 30 min visit  - to determine IPF v non-IPF and if you need biopsy or not  - and to discuss possible treatment options     SIGNATURE    Dr. Brand Males, M.D., F.C.C.P,  Pulmonary and Critical Care Medicine Staff Physician, [place] Director - Interstitial Lung Disease  Program  Pulmonary [place] at [place], [place], [address]  Pager: [phone], If no answer or between  15:00h - 7:00h: call [phone] Telephone: [phone]  3:06 PM 12/21/2021

## 2021-12-21 NOTE — Telephone Encounter (Signed)
Called pt and spoke with spouse as pt was not home yet. Stated to her to have pt call office when he returned home so we could discuss the info per MR with him and she verbalized understanding. Will await return call prior to ordering echo. ?

## 2021-12-21 NOTE — Telephone Encounter (Signed)
He has a diagnosis of CHF.  Last echo was in September 2021.  Also has atrial flutter.  As part of the work-up please also get an echocardiogram ?

## 2021-12-22 NOTE — Telephone Encounter (Signed)
Called and spoke with pt letting him know the info per MR and he verbalized understanding. Order placed for echo.nothing further needed. ?

## 2021-12-23 ENCOUNTER — Other Ambulatory Visit (HOSPITAL_BASED_OUTPATIENT_CLINIC_OR_DEPARTMENT_OTHER): Payer: Self-pay

## 2021-12-23 ENCOUNTER — Telehealth: Payer: Self-pay | Admitting: Family

## 2021-12-23 DIAGNOSIS — Z1211 Encounter for screening for malignant neoplasm of colon: Secondary | ICD-10-CM

## 2021-12-23 LAB — ANTI-SCLERODERMA ANTIBODY: Scleroderma (Scl-70) (ENA) Antibody, IgG: <1 AI

## 2021-12-23 LAB — CK TOTAL AND CKMB (NOT AT ARMC)
CK, MB: 1 ng/mL (ref 0–5.0)
Relative Index: 1 (ref 0–4.0)
Total CK: 104 U/L (ref 44–196)

## 2021-12-23 LAB — SJOGREN'S SYNDROME ANTIBODS(SSA + SSB)
SSA (Ro) (ENA) Antibody, IgG: <1 AI
SSB (La) (ENA) Antibody, IgG: <1 AI

## 2021-12-23 LAB — CYCLIC CITRUL PEPTIDE ANTIBODY, IGG: Cyclic Citrullin Peptide Ab: <16 UNITS

## 2021-12-23 LAB — ALDOLASE: Aldolase: 3.5 U/L (ref <=8.1)

## 2021-12-23 LAB — ANTI-DNA ANTIBODY, DOUBLE-STRANDED: ds DNA Ab: <1 IU/mL

## 2021-12-23 LAB — RHEUMATOID FACTOR: Rheumatoid fact SerPl-aCnc: <14 IU/mL (ref <14)

## 2021-12-23 NOTE — Telephone Encounter (Signed)
See mychart.  

## 2021-12-29 ENCOUNTER — Ambulatory Visit (INDEPENDENT_AMBULATORY_CARE_PROVIDER_SITE_OTHER): Payer: HMO | Admitting: *Deleted

## 2021-12-29 DIAGNOSIS — Z23 Encounter for immunization: Secondary | ICD-10-CM | POA: Diagnosis not present

## 2021-12-29 NOTE — Progress Notes (Signed)
Patient here for Hep B vaccine per verbal order from PCP.  Labs from gastro 12/07/21 stated that patient did not have immunity to Hep B and needed the Hep B series.  Vaccine given in left deltoid and patient tolerated well.  Patient scheduled for 2nd vaccine on 03/04/22.

## 2021-12-30 LAB — HYPERSENSITIVITY PNEUMONITIS
A. Pullulans Abs: NEGATIVE
A.Fumigatus #1 Abs: NEGATIVE
Micropolyspora faeni, IgG: NEGATIVE
Pigeon Serum Abs: NEGATIVE
Thermoact. Saccharii: NEGATIVE
Thermoactinomyces vulgaris, IgG: NEGATIVE

## 2022-01-06 DIAGNOSIS — H43813 Vitreous degeneration, bilateral: Secondary | ICD-10-CM | POA: Diagnosis not present

## 2022-01-06 DIAGNOSIS — H35372 Puckering of macula, left eye: Secondary | ICD-10-CM | POA: Diagnosis not present

## 2022-01-06 DIAGNOSIS — Z961 Presence of intraocular lens: Secondary | ICD-10-CM | POA: Diagnosis not present

## 2022-01-06 DIAGNOSIS — E119 Type 2 diabetes mellitus without complications: Secondary | ICD-10-CM | POA: Diagnosis not present

## 2022-01-06 LAB — HM DIABETES EYE EXAM

## 2022-01-07 ENCOUNTER — Telehealth: Payer: Self-pay

## 2022-01-07 NOTE — Telephone Encounter (Signed)
Caller Name [facility] Phone Number [phone] Patient Name Ryan [name] Patient DOB 05/16/1946 Call Type Message Only Information Provided Reason for Call Request for General Office Information Initial Comment Caller states he is calling to verify appts. Additional Comment office hours provided. Disp. Time Disposition Final User 01/07/2022 12:32:54 PM General Information Provided Yes Peggye Fothergill Call Closed By: Peggye Fothergill Transaction Date/Time: 01/07/2022 12:31:26 PM (ET)

## 2022-01-07 NOTE — Telephone Encounter (Signed)
Pt called and lvm to return call to verify appt

## 2022-01-11 DIAGNOSIS — Z1211 Encounter for screening for malignant neoplasm of colon: Secondary | ICD-10-CM | POA: Diagnosis not present

## 2022-01-13 ENCOUNTER — Ambulatory Visit (HOSPITAL_COMMUNITY): Payer: HMO | Attending: Internal Medicine

## 2022-01-13 DIAGNOSIS — I4892 Unspecified atrial flutter: Secondary | ICD-10-CM | POA: Diagnosis not present

## 2022-01-13 DIAGNOSIS — I5032 Chronic diastolic (congestive) heart failure: Secondary | ICD-10-CM | POA: Diagnosis not present

## 2022-01-13 LAB — ECHOCARDIOGRAM COMPLETE
Area-P 1/2: 2.37 cm2
S' Lateral: 3.1 cm

## 2022-01-18 ENCOUNTER — Encounter: Payer: Self-pay | Admitting: Internal Medicine

## 2022-01-18 ENCOUNTER — Ambulatory Visit: Payer: HMO | Admitting: Internal Medicine

## 2022-01-18 VITALS — BP 160/80 | HR 74 | Ht 69.0 in | Wt 163.0 lb

## 2022-01-18 DIAGNOSIS — R932 Abnormal findings on diagnostic imaging of liver and biliary tract: Secondary | ICD-10-CM | POA: Diagnosis not present

## 2022-01-18 NOTE — Patient Instructions (Signed)
If you are age 76 or older, your body mass index should be between 23-30. Your Body mass index is 24.07 kg/m. If this is out of the aforementioned range listed, please consider follow up with your Primary Care Provider.  If you are age 58 or younger, your body mass index should be between 19-25. Your Body mass index is 24.07 kg/m. If this is out of the aformentioned range listed, please consider follow up with your Primary Care Provider.   ________________________________________________________  The [facility] providers would like to encourage you to use [facility] to communicate with providers for non-urgent requests or questions.  Due to long hold times on the telephone, sending your provider a message by [facility] may be a faster and more efficient way to get a response.  Please allow 48 business hours for a response.  Please remember that this is for non-urgent requests.  _______________________________________________________  Please follow up as needed

## 2022-01-18 NOTE — Progress Notes (Signed)
HISTORY OF PRESENT ILLNESS:  Ryan [name] is a 76 y.o. male with multiple significant medical problems as listed below including a history of atrial arrhythmia for which he is on Eliquis.  Patient was evaluated in this office by the GI nurse practitioner Dec 07, 2021 regarding possible cirrhosis of the liver based on CT imaging of the chest for other reasons.  He had no significant risk factors for liver disease.  His blood work was unremarkable including normal liver tests, albumin, CBC, and multiple ancillary studies assessing for chronic liver disease.  His INR was 1.2 on Eliquis.  He presents today for follow-up.  He did undergo formal ultrasound which revealed a heterogeneous liver echotexture with mildly nodular contour.  And possible granulomatous changes in the spleen.  REVIEW OF SYSTEMS:  All non-GI ROS negative otherwise stated in the HPI except for arthritis, cough  Past Medical History:  Diagnosis Date   Allergy    Arthritis    Diabetes mellitus    GERD (gastroesophageal reflux disease)    History of chicken pox    Hyperlipidemia    Hypertension    Myocardial infarction ([facility]) 2017   Type 2 diabetes mellitus with hyperglycemia, with long-term current use of insulin ([place]) 09/08/2015    Past Surgical History:  Procedure Laterality Date   EYE SURGERY  1998   bilateral   SHOULDER SURGERY  1990   right shoulder, torn rotator cuff.    Social History Ryan [name]  reports that he quit smoking about 56 years ago. His smoking use included cigarettes. He has never used smokeless tobacco. He reports that he does not drink alcohol and does not use drugs.  family history includes Cancer in his mother; Diabetes in his paternal grandfather; Heart disease in his mother; Hyperlipidemia in his father and mother; Hypertension in his father and mother; Stroke in his father.  Allergies  Allergen Reactions   Other Other (See Comments)    Client states he became very sick and had  significant skin inflammation and irritation with the Freestyle Libre sensor       PHYSICAL EXAMINATION: Vital signs: BP (!) 160/80   Pulse 74   Ht $R'5\' 9"'Vg$  (1.753 m)   Wt 163 lb (73.9 kg)   BMI 24.07 kg/m   Constitutional: generally well-appearing, no acute distress Psychiatric: alert and oriented x3, cooperative Eyes: extraocular movements intact, anicteric, conjunctiva pink Mouth: oral pharynx moist, no lesions Neck: supple no lymphadenopathy Cardiovascular: heart regular rate and rhythm, no murmur Lungs: clear to auscultation bilaterally Abdomen: soft, nontender, nondistended, no obvious ascites, no peritoneal signs, normal bowel sounds, no organomegaly Rectal: Omitted Extremities: no clubbing, cyanosis, or lower extremity edema bilaterally Skin: no lesions on visible extremities Neuro: No focal deficits. No asterixis.     ASSESSMENT:  1.  Question of hepatic cirrhosis on imaging.  Fairly subtle changes.  Imaging has low sensitivity and specificity for liver disease unless grossly abnormal (which this is not).  No clinical evidence for liver disease.  Certainly, no evidence for hepatic synthetic dysfunction. 2.  Multiple medical problems   PLAN:  1.  We reviewed his work-up in detail today.  No further work-up needed.  No additional recommendations.  Patient will follow-up as needed.  He will resume general medical care with his primary care physician and other specialists. A total time of 30 minutes was spent preparing to see the patient, reviewing the myriad of laboratories and x-rays, obtaining interval history, performing medically appropriate physical examination, counseling  and educating patient regarding the above listed issues, and documenting clinical information in the health record

## 2022-01-20 LAB — COLOGUARD: COLOGUARD: NEGATIVE

## 2022-01-21 ENCOUNTER — Other Ambulatory Visit: Payer: Self-pay | Admitting: Family

## 2022-01-21 ENCOUNTER — Other Ambulatory Visit (HOSPITAL_BASED_OUTPATIENT_CLINIC_OR_DEPARTMENT_OTHER): Payer: Self-pay

## 2022-01-21 MED ORDER — ATORVASTATIN CALCIUM 40 MG PO TABS
40.0000 mg | ORAL_TABLET | Freq: Every day | ORAL | 1 refills | Status: DC
  Filled 2022-01-21 – 2022-02-15 (×2): qty 90, 90d supply, fill #0
  Filled 2022-05-21: qty 90, 90d supply, fill #1

## 2022-01-21 MED ORDER — METOPROLOL SUCCINATE ER 25 MG PO TB24
ORAL_TABLET | Freq: Every day | ORAL | 1 refills | Status: DC
  Filled 2022-01-21: qty 90, fill #0
  Filled 2022-02-15: qty 90, 90d supply, fill #0
  Filled 2022-05-21: qty 90, 90d supply, fill #1

## 2022-01-25 ENCOUNTER — Ambulatory Visit: Payer: HMO | Admitting: Family

## 2022-02-05 ENCOUNTER — Ambulatory Visit: Payer: HMO | Admitting: Internal Medicine

## 2022-02-08 ENCOUNTER — Ambulatory Visit (INDEPENDENT_AMBULATORY_CARE_PROVIDER_SITE_OTHER): Payer: HMO

## 2022-02-08 VITALS — Ht 69.0 in | Wt 163.0 lb

## 2022-02-08 DIAGNOSIS — Z0000 Encounter for general adult medical examination without abnormal findings: Secondary | ICD-10-CM | POA: Diagnosis not present

## 2022-02-08 NOTE — Patient Instructions (Signed)
Ryan [name] , Thank you for taking time to complete your Medicare Wellness Visit. I appreciate your ongoing commitment to your health goals. Please review the following plan we discussed and let me know if I can assist you in the future.   Screening recommendations/referrals: Colonoscopy: Completed 01/11/2022-Due 01/11/2025 Recommended yearly ophthalmology/optometry visit for glaucoma screening and checkup Recommended yearly dental visit for hygiene and checkup  Vaccinations: Influenza vaccine: Up to date Pneumococcal vaccine: Up to date Tdap vaccine: Due-May obtain vaccine at your local pharmacy. Shingles vaccine: Completed vaccines   Covid-19: Booster availale at  your local pharmacy  Advanced directives: Due-May obtain vaccine at our office or your local pharmacy.   Conditions/risks identified: See problem list  Next appointment: Follow up in one year for your annual wellness visit.   Preventive Care 21 Years and Older, Male Preventive care refers to lifestyle choices and visits with your health care provider that can promote health and wellness. What does preventive care include? A yearly physical exam. This is also called an annual well check. Dental exams once or twice a year. Routine eye exams. Ask your health care provider how often you should have your eyes checked. Personal lifestyle choices, including: Daily care of your teeth and gums. Regular physical activity. Eating a healthy diet. Avoiding tobacco and drug use. Limiting alcohol use. Practicing safe sex. Taking low doses of aspirin every day. Taking vitamin and mineral supplements as recommended by your health care provider. What happens during an annual well check? The services and screenings done by your health care provider during your annual well check will depend on your age, overall health, lifestyle risk factors, and family history of disease. Counseling  Your health care provider may ask you questions about  your: Alcohol use. Tobacco use. Drug use. Emotional well-being. Home and relationship well-being. Sexual activity. Eating habits. History of falls. Memory and ability to understand (cognition). Work and work Astronomer. Screening  You may have the following tests or measurements: Height, weight, and BMI. Blood pressure. Lipid and cholesterol levels. These may be checked every 5 years, or more frequently if you are over 18 years old. Skin check. Lung cancer screening. You may have this screening every year starting at age 28 if you have a 30-pack-year history of smoking and currently smoke or have quit within the past 15 years. Fecal occult blood test (FOBT) of the stool. You may have this test every year starting at age 32. Flexible sigmoidoscopy or colonoscopy. You may have a sigmoidoscopy every 5 years or a colonoscopy every 10 years starting at age 81. Prostate cancer screening. Recommendations will vary depending on your family history and other risks. Hepatitis C blood test. Hepatitis B blood test. Sexually transmitted disease (STD) testing. Diabetes screening. This is done by checking your blood sugar (glucose) after you have not eaten for a while (fasting). You may have this done every 1-3 years. Abdominal aortic aneurysm (AAA) screening. You may need this if you are a current or former smoker. Osteoporosis. You may be screened starting at age 5 if you are at high risk. Talk with your health care provider about your test results, treatment options, and if necessary, the need for more tests. Vaccines  Your health care provider may recommend certain vaccines, such as: Influenza vaccine. This is recommended every year. Tetanus, diphtheria, and acellular pertussis (Tdap, Td) vaccine. You may need a Td booster every 10 years. Zoster vaccine. You may need this after age 22. Pneumococcal 13-valent conjugate (PCV13) vaccine.  One dose is recommended after age 10. Pneumococcal  polysaccharide (PPSV23) vaccine. One dose is recommended after age 59. Talk to your health care provider about which screenings and vaccines you need and how often you need them. This information is not intended to replace advice given to you by your health care provider. Make sure you discuss any questions you have with your health care provider. Document Released: 08/22/2015 Document Revised: 04/14/2016 Document Reviewed: 05/27/2015 Elsevier Interactive Patient Education  2017 [employer] Prevention in the Home Falls can cause injuries. They can happen to people of all ages. There are many things you can do to make your home safe and to help prevent falls. What can I do on the outside of my home? Regularly fix the edges of walkways and driveways and fix any cracks. Remove anything that might make you trip as you walk through a door, such as a raised step or threshold. Trim any bushes or trees on the path to your home. Use bright outdoor lighting. Clear any walking paths of anything that might make someone trip, such as rocks or tools. Regularly check to see if handrails are loose or broken. Make sure that both sides of any steps have handrails. Any raised decks and porches should have guardrails on the edges. Have any leaves, snow, or ice cleared regularly. Use sand or salt on walking paths during winter. Clean up any spills in your garage right away. This includes oil or grease spills. What can I do in the bathroom? Use night lights. Install grab bars by the toilet and in the tub and shower. Do not use towel bars as grab bars. Use non-skid mats or decals in the tub or shower. If you need to sit down in the shower, use a plastic, non-slip stool. Keep the floor dry. Clean up any water that spills on the floor as soon as it happens. Remove soap buildup in the tub or shower regularly. Attach bath mats securely with double-sided non-slip rug tape. Do not have throw rugs and other  things on the floor that can make you trip. What can I do in the bedroom? Use night lights. Make sure that you have a light by your bed that is easy to reach. Do not use any sheets or blankets that are too big for your bed. They should not hang down onto the floor. Have a firm chair that has side arms. You can use this for support while you get dressed. Do not have throw rugs and other things on the floor that can make you trip. What can I do in the kitchen? Clean up any spills right away. Avoid walking on wet floors. Keep items that you use a lot in easy-to-reach places. If you need to reach something above you, use a strong step stool that has a grab bar. Keep electrical cords out of the way. Do not use floor polish or wax that makes floors slippery. If you must use wax, use non-skid floor wax. Do not have throw rugs and other things on the floor that can make you trip. What can I do with my stairs? Do not leave any items on the stairs. Make sure that there are handrails on both sides of the stairs and use them. Fix handrails that are broken or loose. Make sure that handrails are as long as the stairways. Check any carpeting to make sure that it is firmly attached to the stairs. Fix any carpet that is loose or  worn. Avoid having throw rugs at the top or bottom of the stairs. If you do have throw rugs, attach them to the floor with carpet tape. Make sure that you have a light switch at the top of the stairs and the bottom of the stairs. If you do not have them, ask someone to add them for you. What else can I do to help prevent falls? Wear shoes that: Do not have high heels. Have rubber bottoms. Are comfortable and fit you well. Are closed at the toe. Do not wear sandals. If you use a stepladder: Make sure that it is fully opened. Do not climb a closed stepladder. Make sure that both sides of the stepladder are locked into place. Ask someone to hold it for you, if possible. Clearly  mark and make sure that you can see: Any grab bars or handrails. First and last steps. Where the edge of each step is. Use tools that help you move around (mobility aids) if they are needed. These include: Canes. Walkers. Scooters. Crutches. Turn on the lights when you go into a dark area. Replace any light bulbs as soon as they burn out. Set up your furniture so you have a clear path. Avoid moving your furniture around. If any of your floors are uneven, fix them. If there are any pets around you, be aware of where they are. Review your medicines with your doctor. Some medicines can make you feel dizzy. This can increase your chance of falling. Ask your doctor what other things that you can do to help prevent falls. This information is not intended to replace advice given to you by your health care provider. Make sure you discuss any questions you have with your health care provider. Document Released: 05/22/2009 Document Revised: 01/01/2016 Document Reviewed: 08/30/2014 Elsevier Interactive Patient Education  2017 [employer].

## 2022-02-08 NOTE — Progress Notes (Signed)
Subjective:   Ryan [name] is a 76 y.o. male who presents for Medicare Annual/Subsequent preventive examination.  I connected with Cottrell today by telephone and verified that I am speaking with the correct person using two identifiers. Location patient: home Location provider: work Persons participating in the virtual visit: patient, Marine scientist.    I discussed the limitations, risks, security and privacy concerns of performing an evaluation and management service by telephone and the availability of in person appointments. I also discussed with the patient that there may be a patient responsible charge related to this service. The patient expressed understanding and verbally consented to this telephonic visit.    Interactive audio and video telecommunications were attempted between this provider and patient, however failed, due to patient having technical difficulties OR patient did not have access to video capability.  We continued and completed visit with audio only.  Some vital signs may be absent or patient reported.   Time Spent with patient on telephone encounter: 20 minutes   Review of Systems     Cardiac Risk Factors include: advanced age (>37mn, >>63women);male gender;hypertension;dyslipidemia;diabetes mellitus     Objective:    Today's Vitals   02/08/22 1326  Weight: 163 lb (73.9 kg)  Height: '5\' 9"'$  (1.753 m)   Body mass index is 24.07 kg/m.     02/08/2022    1:29 PM 02/02/2021    3:11 PM 01/28/2020   10:19 AM 07/30/2019   11:37 AM 03/06/2019   11:26 AM 01/25/2019    1:09 PM 11/29/2017    9:12 AM  Advanced Directives  Does Patient Have a Medical Advance Directive? Yes Yes No Yes Yes Yes No  Type of AParamedicof ACarrickLiving will  H[place]uc[place]Living will HOakleaf PlantationLiving will HPinellasLiving will   Does patient want to make changes to medical advance directive?     Yes (MAU/Ambulatory/Procedural Areas - Information given) No - Patient declined No - Patient declined   Copy of HRosendalein Chart? No - copy requested No - copy requested  No - copy requested  Yes - validated most recent copy scanned in chart (See row information)   Would patient like information on creating a medical advance directive?   No - Patient declined    No - Patient declined    Current Medications (verified) Outpatient Encounter Medications as of 02/08/2022  Medication Sig   apixaban (ELIQUIS) 5 MG TABS tablet TAKE 1 TABLET BY MOUTH TWICE DAILY (Patient taking differently: Take 5 mg by mouth 2 (two) times daily.)   atorvastatin (LIPITOR) 40 MG tablet TAKE 1 TABLET (40 MG TOTAL) BY MOUTH DAILY.   blood glucose meter kit and supplies by Other route See admin instructions. Check blood sugar 6-7 times daily   Calcium Carbonate-Vitamin D 600-400 MG-UNIT tablet Take 1 tablet by mouth 2 (two) times daily. Reported on 01/27/2016   cholecalciferol (VITAMIN D3) 25 MCG (1000 UNIT) tablet Take 1,000 Units by mouth daily.   Glucagon, rDNA, (GLUCAGON EMERGENCY) 1 MG KIT Inject 1 mg into the muscle once as needed for up to 1 dose. (Patient taking differently: Inject 1 mg into the muscle as needed (blood sugar below 45).)   insulin aspart (NOVOLOG) 100 UNIT/ML injection INJECT 5-6 UNITS INTO THE SKIN 3 TIMES A DAY WITH MEALS (Patient taking differently: Inject 0-10 Units into the skin See admin instructions. Per sliding scale 3 times daily)  insulin detemir (LEVEMIR) 100 UNIT/ML injection INJECT 0.12 ML (12 UNITS) INTO THE SKIN AT BEDTIME (Patient taking differently: Inject 12 Units into the skin at bedtime.)   Insulin Syringe-Needle U-100 (TRUEPLUS INSULIN SYRINGE) 31G X 5/16" 0.5 ML MISC USE TO INJECT INSULIN 4 TIMES A DAY   metoprolol succinate (TOPROL-XL) 25 MG 24 hr tablet TAKE 1 TABLET (25 MG TOTAL) BY MOUTH DAILY.   No facility-administered encounter medications on file as of  02/08/2022.    Allergies (verified) Other   History: Past Medical History:  Diagnosis Date   Allergy    Arthritis    Diabetes mellitus    GERD (gastroesophageal reflux disease)    History of chicken pox    Hyperlipidemia    Hypertension    Myocardial infarction ([place]) Oct 20, 2015   Type 2 diabetes mellitus with hyperglycemia, with long-term current use of insulin ([place]) 09/08/2015   Past Surgical History:  Procedure Laterality Date   EYE SURGERY  1998   bilateral   SHOULDER SURGERY  1990   right shoulder, torn rotator cuff.   Family History  Problem Relation Age of Onset   Cancer Mother        cancer   Heart disease Mother    Hyperlipidemia Mother    Hypertension Mother    Hyperlipidemia Father    Stroke Father    Hypertension Father    Diabetes Paternal Grandfather    Colon cancer Neg Hx    Stomach cancer Neg Hx    Esophageal cancer Neg Hx    Social History   Socioeconomic History   Marital status: Widowed    Spouse name: Not on file   Number of children: 1   Years of education: Not on file   Highest education level: Not on file  Occupational History   Occupation: retired    Comment: from retail  Tobacco Use   Smoking status: Former    Types: Cigarettes    Quit date: 09/01/1965    Years since quitting: 56.4   Smokeless tobacco: Never   Tobacco comments:    Smoked about 1cig a week for about 6 months.  Vaping Use   Vaping Use: Never used  Substance and Sexual Activity   Alcohol use: No   Drug use: No   Sexual activity: Never  Other Topics Concern   Not on file  Social History Narrative   Regular exercise:  5 x weekly   Caffeine Use: 1 cups coffee daily.   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   Son, daughter in law and young adult granddaughter and grandson live with client   Wife died from ovarian cancer in 2014-10-20         Social Determinants of Health   Financial Resource Strain: Low Risk  (02/08/2022)   Overall Financial Resource Strain (CARDIA)     Difficulty of Paying Living Expenses: Not hard at all  Food Insecurity: No Food Insecurity (02/08/2022)   Hunger Vital Sign    Worried About Running Out of Food in the Last Year: Never true    Ran Out of Food in the Last Year: Never true  Transportation Needs: No Transportation Needs (02/08/2022)   PRAPARE - Hydrologist (Medical): No    Lack of Transportation (Non-Medical): No  Physical Activity: Sufficiently Active (02/08/2022)   Exercise Vital Sign    Days of Exercise per Week: 7 days    Minutes of Exercise per Session: 30 min  Stress: No Stress Concern Present (  02/08/2022)   [place]    Feeling of Stress : Not at all  Social Connections: Socially Isolated (02/02/2021)   Social Connection and Isolation Panel [NHANES]    Frequency of Communication with Friends and Family: More than three times a week    Frequency of Social Gatherings with Friends and Family: Once a week    Attends Religious Services: Never    Marine scientist or Organizations: No    Attends Archivist Meetings: Never    Marital Status: Widowed    Tobacco Counseling Counseling given: Not Answered Tobacco comments: Smoked about 1cig a week for about 6 months.   Clinical Intake:  Pre-visit preparation completed: Yes  Pain : No/denies pain     BMI - recorded: 24.07 Nutritional Status: BMI of 19-24  Normal Nutritional Risks: None Diabetes: Yes CBG done?: No Did pt. bring in CBG monitor from home?: No (phone visit)  How often do you need to have someone help you when you read instructions, pamphlets, or other written materials from your doctor or pharmacy?: 1 - Never  Diabetes:  Is the patient diabetic?  Yes  If diabetic, was a CBG obtained today?  No  Did the patient bring in their glucometer from home?  No phone visit How often do you monitor your CBG's? daily.   Financial Strains and Diabetes  Management:  Are you having any financial strains with the device, your supplies or your medication? No .  Does the patient want to be seen by Chronic Care Management for management of their diabetes?  No  Would the patient like to be referred to a Nutritionist or for Diabetic Management?  No   Diabetic Exams:  Diabetic Eye Exam: Completed 01/06/2022.   Diabetic Foot Exam: Pt has been advised about the importance in completing this exam. To be completed by PCP.    Interpreter Needed?: No  Information entered by :: Caroleen Hamman LPN   Activities of Daily Living    02/08/2022    1:31 PM  In your present state of health, do you have any difficulty performing the following activities:  Hearing? 0  Vision? 0  Difficulty concentrating or making decisions? 0  Walking or climbing stairs? 0  Dressing or bathing? 0  Doing errands, shopping? 0  Preparing Food and eating ? N  Using the Toilet? N  In the past six months, have you accidently leaked urine? N  Do you have problems with loss of bowel control? N  Managing your Medications? N  Managing your Finances? N  Housekeeping or managing your Housekeeping? N    Patient Care Team: [name], NP as PCP - General (Internal Medicine) Fay Records, MD as PCP - Cardiology (Cardiology) [name], MD as Consulting Physician (Endocrinology) Fay Records, MD as Consulting Physician (Cardiology) Cherre Robins, [place] (Pharmacist) Luretha Rued, RN as Case Manager  Indicate any recent Medical Services you may have received from other than [facility] providers in the past year (date may be approximate).     Assessment:   This is a routine wellness examination for [employer].  Hearing/Vision screen Hearing Screening - Comments:: No issues Vision Screening - Comments:: Last eye exam-5/32/2023-Dr. Groat  Dietary issues and exercise activities discussed: Current Exercise Habits: Home exercise routine, Type of exercise: walking,  Time (Minutes): 30, Frequency (Times/Week): 7, Weekly Exercise (Minutes/Week): 210, Intensity: Mild, Exercise limited by: None identified   Goals Addressed  This Visit's Progress    Patient Stated       Continue walking       Depression Screen    02/08/2022    1:31 PM 02/02/2021    3:15 PM 01/23/2021   10:06 AM 01/28/2020   10:26 AM 01/21/2020    3:04 PM 03/06/2019   10:17 AM 01/25/2019    1:10 PM  PHQ 2/9 Scores  PHQ - 2 Score 0 0 0 0 0 0 0    Fall Risk    02/08/2022    1:30 PM 02/02/2021    3:14 PM 01/23/2021   10:05 AM 01/28/2020   10:26 AM 01/25/2019    1:10 PM  Fall Risk   Falls in the past year? 0 0 0 0 0  Number falls in past yr: 0 0 0 0   Injury with Fall? 0 0 0 0   Follow up Falls prevention discussed Falls prevention discussed  Education provided;Falls prevention discussed     FALL RISK PREVENTION PERTAINING TO THE HOME:  Any stairs in or around the home? Yes  If so, are there any without handrails? No  Home free of loose throw rugs in walkways, pet beds, electrical cords, etc? Yes  Adequate lighting in your home to reduce risk of falls? Yes   ASSISTIVE DEVICES UTILIZED TO PREVENT FALLS:  Life alert? No  Use of a cane, walker or w/c? No  Grab bars in the bathroom? Yes  Shower chair or bench in shower? No  Elevated toilet seat or a handicapped toilet? No   TIMED UP AND GO:  Was the test performed? No . Phone visit   Cognitive Function:Normal cognitive status assessed by this Nurse Health Advisor. No abnormalities found.      11/29/2017    9:18 AM 11/22/2016    9:33 AM  MMSE - Mini Mental State Exam  Orientation to time 5 5  Orientation to Place 5 5  Registration 3 3  Attention/ Calculation 5 5  Recall 3 2  Language- name 2 objects 2 2  Language- repeat 1 1  Language- follow 3 step command 3 3  Language- read & follow direction 1 1  Write a sentence 1 1  Copy design 1 0  Total score 30 28        Immunizations Immunization  History  Administered Date(s) Administered   Fluad Quad(high Dose 65+) 05/15/2019, 04/25/2020, 06/24/2021   H1N1 11/19/2008   Hepb-cpg 12/29/2021   Influenza Split 05/25/2011, 04/20/2012   Influenza Whole 06/06/2009   Influenza, High Dose Seasonal PF 04/20/2016, 05/12/2017, 05/26/2018   Influenza,inj,Quad PF,6+ Mos 05/07/2013, 05/27/2014, 05/13/2015   Influenza-Unspecified 05/09/2004, 06/09/2005, 06/15/2006, 07/11/2007, 05/15/2008, 06/06/2009   PFIZER(Purple Top)SARS-COV-2 Vaccination 09/30/2019, 10/24/2019, 07/22/2020   Pneumococcal Conjugate-13 10/19/2004, 05/11/2013   Pneumococcal Polysaccharide-23 05/25/2011, 09/08/2018   Td 08/09/2001, 08/25/2011   Zoster Recombinat (Shingrix) 01/22/2021, 05/20/2021   Zoster, Live 08/25/2011    TDAP status: Due, Education has been provided regarding the importance of this vaccine. Advised may receive this vaccine at local pharmacy or Health Dept. Aware to provide a copy of the vaccination record if obtained from local pharmacy or Health Dept. Verbalized acceptance and understanding.  Flu Vaccine status: Up to date  Pneumococcal vaccine status: Up to date  Covid-19 vaccine status: Information provided on how to obtain vaccines.   Qualifies for Shingles Vaccine? No   Zostavax completed Yes   Shingrix Completed?: Yes  Screening Tests Health Maintenance  Topic Date Due   [id number]  Vaccine (4 - Booster for Pfizer series) 09/16/2020   URINE MICROALBUMIN  10/08/2020   TETANUS/TDAP  08/24/2021   FOOT EXAM  01/23/2022   INFLUENZA VACCINE  03/09/2022   HEMOGLOBIN A1C  04/29/2022   OPHTHALMOLOGY EXAM  01/07/2023   Fecal DNA (Cologuard)  01/11/2025   Pneumonia Vaccine 23+ Years old  Completed   Hepatitis C Screening  Completed   Zoster Vaccines- Shingrix  Completed   HPV VACCINES  Aged Out    Health Maintenance  Health Maintenance Due  Topic Date Due   COVID-19 Vaccine (4 - Booster for Pfizer series) 09/16/2020   URINE MICROALBUMIN   10/08/2020   TETANUS/TDAP  08/24/2021   FOOT EXAM  01/23/2022    Colorectal cancer screening: Type of screening: Cologuard. Completed 01/11/2022. Repeat every 3 years  Lung Cancer Screening: (Low Dose CT Chest recommended if Age 45-80 years, 30 pack-year currently smoking OR have quit w/in 15years.) does not qualify.     Additional Screening:  Hepatitis C Screening: Completed 12/07/2021  Vision Screening: Recommended annual ophthalmology exams for early detection of glaucoma and other disorders of the eye. Is the patient up to date with their annual eye exam?  Yes  Who is the provider or what is the name of the office in which the patient attends annual eye exams? Dr. Katy Fitch  Dental Screening: Recommended annual dental exams for proper oral hygiene  Community Resource Referral / Chronic Care Management: CRR required this visit?  No   CCM required this visit?  No      Plan:     I have personally reviewed and noted the following in the patient's chart:   Medical and social history Use of alcohol, tobacco or illicit drugs  Current medications and supplements including opioid prescriptions. Patient is not currently taking opioid prescriptions. Functional ability and status Nutritional status Physical activity Advanced directives List of other physicians Hospitalizations, surgeries, and ER visits in previous 12 months Vitals Screenings to include cognitive, depression, and falls Referrals and appointments  In addition, I have reviewed and discussed with patient certain preventive protocols, quality metrics, and best practice recommendations. A written personalized care plan for preventive services as well as general preventive health recommendations were provided to patient.   Due to this being a telephonic visit, the after visit summary with patients personalized plan was offered to patient via mail or my-chart. Patient would like to access on my-chart.  Marta Antu,  LPN   [id number]  Nurse health Advisor  Nurse Notes: None

## 2022-02-15 ENCOUNTER — Other Ambulatory Visit (HOSPITAL_BASED_OUTPATIENT_CLINIC_OR_DEPARTMENT_OTHER): Payer: Self-pay

## 2022-03-02 ENCOUNTER — Other Ambulatory Visit (HOSPITAL_BASED_OUTPATIENT_CLINIC_OR_DEPARTMENT_OTHER): Payer: Self-pay

## 2022-03-02 ENCOUNTER — Ambulatory Visit: Payer: PPO

## 2022-03-02 ENCOUNTER — Ambulatory Visit (INDEPENDENT_AMBULATORY_CARE_PROVIDER_SITE_OTHER): Payer: PPO | Admitting: Family

## 2022-03-02 VITALS — BP 113/68 | HR 70 | Temp 98.3°F | Resp 16 | Wt 161.0 lb

## 2022-03-02 DIAGNOSIS — I10 Essential (primary) hypertension: Secondary | ICD-10-CM | POA: Diagnosis not present

## 2022-03-02 DIAGNOSIS — Z23 Encounter for immunization: Secondary | ICD-10-CM

## 2022-03-02 DIAGNOSIS — R0981 Nasal congestion: Secondary | ICD-10-CM | POA: Diagnosis not present

## 2022-03-02 MED ORDER — BENZONATATE 100 MG PO CAPS
100.0000 mg | ORAL_CAPSULE | Freq: Two times a day (BID) | ORAL | 0 refills | Status: DC | PRN
  Filled 2022-03-02: qty 20, 10d supply, fill #0

## 2022-03-02 NOTE — Patient Instructions (Signed)
Add claritin 10mg  once daily for nasal drainage.

## 2022-03-02 NOTE — Addendum Note (Signed)
Addended by: Wilford Corner on: 03/02/2022 11:50 AM   Modules accepted: Orders

## 2022-03-02 NOTE — Assessment & Plan Note (Signed)
Initial bp was high upon arrival. His home bp this AM looked great. I definitely think that he has some white coat hypertension. Other home bp readings look great. Continue current dose of metoprolol.

## 2022-03-02 NOTE — Progress Notes (Signed)
Subjective:   By signing my name below, I, [name], attest that this documentation has been prepared under the direction and in the presence of Karie Chimera, NP 03/02/2022   Patient ID: Ryan [name], male    DOB: 10-04-1945, 76 y.o.   MRN: [id number]  Chief Complaint  Patient presents with   Hypertension    Here for follow up   Immunizations    Had Heplisav B 12/29/21   Cough    Complains of cough with drainage    HPI Patient is in today for an office visit  Cold - He reports that he is currently experiencing a cold such as a cough and runny nose. He reports that he had a cold a couple of weeks ago on 02/06/2022 and his symptoms come and go. His coughing worsens when he lays down at night.  He has not done a Covid-19 test. He is requesting to be prescribed Tessalon for his cough.   Blood Pressure - As of today's visit, his blood pressure is elevating. He reports that his pulse was higher than normal the morning of 0725/2023 BP Readings from Last 3 Encounters:  03/02/22 113/68  01/18/22 (!) 160/80  12/21/21 124/70   Pulse Readings from Last 3 Encounters:  03/02/22 70  01/18/22 74  12/21/21 65   Blood Sugar - He reports that his blood sugar "crashed" on the night of 03/01/2022. He is currently followed his endocrinologist, Dr. Cruzita Lederer.  Lab Results  Component Value Date   HGBA1C 7.2 (H) 10/27/2021   Immunizations - He is requesting to receive the 2nd HepB vaccine.   Health Maintenance Due  Topic Date Due   COVID-19 Vaccine (4 - Booster for Pfizer series) 09/16/2020   URINE MICROALBUMIN  10/08/2020   TETANUS/TDAP  08/24/2021   FOOT EXAM  01/23/2022    Past Medical History:  Diagnosis Date   Allergy    Arthritis    Diabetes mellitus    GERD (gastroesophageal reflux disease)    History of chicken pox    Hyperlipidemia    Hypertension    Myocardial infarction ([place]) 2017   Type 2 diabetes mellitus with hyperglycemia, with long-term current use of  insulin ([place]) 09/08/2015    Past Surgical History:  Procedure Laterality Date   EYE SURGERY  1998   bilateral   SHOULDER SURGERY  1990   right shoulder, torn rotator cuff.    Family History  Problem Relation Age of Onset   Cancer Mother        cancer   Heart disease Mother    Hyperlipidemia Mother    Hypertension Mother    Hyperlipidemia Father    Stroke Father    Hypertension Father    Diabetes Paternal Grandfather    Colon cancer Neg Hx    Stomach cancer Neg Hx    Esophageal cancer Neg Hx     Social History   Socioeconomic History   Marital status: Widowed    Spouse name: Not on file   Number of children: 1   Years of education: Not on file   Highest education level: Not on file  Occupational History   Occupation: retired    Comment: from retail  Tobacco Use   Smoking status: Former    Types: Cigarettes    Quit date: 09/01/1965    Years since quitting: 56.5   Smokeless tobacco: Never   Tobacco comments:    Smoked about 1cig a week for about 6 months.  Vaping Use   Vaping Use: Never used  Substance and Sexual Activity   Alcohol use: No   Drug use: No   Sexual activity: Never  Other Topics Concern   Not on file  Social History Narrative   Regular exercise:  5 x weekly   Caffeine Use: 1 cups coffee daily.   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   Son, daughter in law and young adult granddaughter and grandson live with client   Wife died from ovarian cancer in 09/10/14         Social Determinants of Health   Financial Resource Strain: Low Risk  (02/08/2022)   Overall Financial Resource Strain (CARDIA)    Difficulty of Paying Living Expenses: Not hard at all  Food Insecurity: No Food Insecurity (02/08/2022)   Hunger Vital Sign    Worried About Running Out of Food in the Last Year: Never true    Ran Out of Food in the Last Year: Never true  Transportation Needs: No Transportation Needs (02/08/2022)   PRAPARE - Hydrologist (Medical):  No    Lack of Transportation (Non-Medical): No  Physical Activity: Sufficiently Active (02/08/2022)   Exercise Vital Sign    Days of Exercise per Week: 7 days    Minutes of Exercise per Session: 30 min  Stress: No Stress Concern Present (02/08/2022)   [place]    Feeling of Stress : Not at all  Social Connections: Socially Isolated (02/02/2021)   Social Connection and Isolation Panel [NHANES]    Frequency of Communication with Friends and Family: More than three times a week    Frequency of Social Gatherings with Friends and Family: Once a week    Attends Religious Services: Never    Marine scientist or Organizations: No    Attends Archivist Meetings: Never    Marital Status: Widowed  Intimate Partner Violence: Not At Risk (02/08/2022)   Humiliation, Afraid, Rape, and Kick questionnaire    Fear of Current or Ex-Partner: No    Emotionally Abused: No    Physically Abused: No    Sexually Abused: No    Outpatient Medications Prior to Visit  Medication Sig Dispense Refill   apixaban (ELIQUIS) 5 MG TABS tablet TAKE 1 TABLET BY MOUTH TWICE DAILY (Patient taking differently: Take 5 mg by mouth 2 (two) times daily.) 60 tablet 5   atorvastatin (LIPITOR) 40 MG tablet TAKE 1 TABLET (40 MG TOTAL) BY MOUTH DAILY. 90 tablet 1   blood glucose meter kit and supplies by Other route See admin instructions. Check blood sugar 6-7 times daily     Calcium Carbonate-Vitamin D 600-400 MG-UNIT tablet Take 1 tablet by mouth 2 (two) times daily. Reported on 01/27/2016     cholecalciferol (VITAMIN D3) 25 MCG (1000 UNIT) tablet Take 1,000 Units by mouth daily.     Glucagon, rDNA, (GLUCAGON EMERGENCY) 1 MG KIT Inject 1 mg into the muscle once as needed for up to 1 dose. (Patient taking differently: Inject 1 mg into the muscle as needed (blood sugar below 45).) 1 kit 12   insulin aspart (NOVOLOG) 100 UNIT/ML injection INJECT 5-6 UNITS INTO  THE SKIN 3 TIMES A DAY WITH MEALS (Patient taking differently: Inject 0-10 Units into the skin See admin instructions. Per sliding scale 3 times daily) 30 mL 5   insulin detemir (LEVEMIR) 100 UNIT/ML injection INJECT 0.12 ML (12 UNITS) INTO THE SKIN AT BEDTIME (Patient  taking differently: Inject 12 Units into the skin at bedtime.) 30 mL 5   Insulin Syringe-Needle U-100 (TRUEPLUS INSULIN SYRINGE) 31G X 5/16" 0.5 ML MISC USE TO INJECT INSULIN 4 TIMES A DAY 300 each 3   metoprolol succinate (TOPROL-XL) 25 MG 24 hr tablet TAKE 1 TABLET (25 MG TOTAL) BY MOUTH DAILY. 90 tablet 1   No facility-administered medications prior to visit.    Allergies  Allergen Reactions   Other Other (See Comments)    Client states he became very sick and had significant skin inflammation and irritation with the Freestyle Libre sensor   Lisinopril     cough    Review of Systems  HENT:         (+) Runny Nose  Respiratory:  Positive for cough.        Objective:    Physical Exam Constitutional:      General: He is not in acute distress.    Appearance: Normal appearance. He is not ill-appearing.  HENT:     Head: Normocephalic and atraumatic.     Right Ear: Tympanic membrane, ear canal and external ear normal.     Left Ear: External ear normal. There is impacted cerumen.     Mouth/Throat:     Pharynx: Posterior oropharyngeal erythema present.  Eyes:     Extraocular Movements: Extraocular movements intact.     Pupils: Pupils are equal, round, and reactive to light.  Cardiovascular:     Heart sounds: Normal heart sounds. No murmur heard.    No gallop.  Pulmonary:     Effort: Pulmonary effort is normal. No respiratory distress.     Breath sounds: Normal breath sounds. No wheezing or rales.  Skin:    General: Skin is warm and dry.  Neurological:     Mental Status: He is alert and oriented to person, place, and time.  Psychiatric:        Mood and Affect: Mood normal.        Behavior: Behavior normal.         Judgment: Judgment normal.     BP 113/68 Comment: home reading prior to visit today  Pulse 70   Temp 98.3 F (36.8 C) (Oral)   Resp 16   Wt 161 lb (73 kg)   SpO2 98%   BMI 23.78 kg/m  Wt Readings from Last 3 Encounters:  03/02/22 161 lb (73 kg)  02/08/22 163 lb (73.9 kg)  01/18/22 163 lb (73.9 kg)       Assessment & Plan:   Problem List Items Addressed This Visit       Unprioritized   Nasal congestion - Primary    Not clear that this is viral given the duration of symptoms.  Does not seem to be a bacterial sinusitis- just thin clear drainage.  Recommended trial of claritin. Tessalon prn cough and he is advised to let us know if her symptoms worsen or if they do not improve.       HTN (hypertension)    Initial bp was high upon arrival. His home bp this AM looked great. I definitely think that he has some white coat hypertension. Other home bp readings look great. Continue current dose of metoprolol.        Meds ordered this encounter  Medications   benzonatate (TESSALON) 100 MG capsule    Sig: Take 1 capsule (100 mg total) by mouth 2 (two) times daily as needed for cough.    Dispense:  20 capsule  Refill:  0    Order Specific Question:   Supervising Provider    Answer:   Penni Homans A [[id number]]    I, Nance Pear, NP, personally preformed the services described in this documentation.  All medical record entries made by the scribe were at my direction and in my presence.  I have reviewed the chart and discharge instructions (if applicable) and agree that the record reflects my personal performance and is accurate and complete. 03/02/2022   I,Amber Collins,acting as a scribe for Nance Pear, NP.,have documented all relevant documentation on the behalf of Nance Pear, NP,as directed by  Nance Pear, NP while in the presence of Nance Pear, NP.   Nance Pear, NP

## 2022-03-02 NOTE — Assessment & Plan Note (Signed)
Not clear that this is viral given the duration of symptoms.  Does not seem to be a bacterial sinusitis- just thin clear drainage.  Recommended trial of claritin. Tessalon prn cough and he is advised to let us know if her symptoms worsen or if they do not improve.

## 2022-03-26 ENCOUNTER — Telehealth: Payer: HMO | Admitting: Family Medicine

## 2022-03-30 ENCOUNTER — Other Ambulatory Visit (HOSPITAL_COMMUNITY): Payer: Self-pay

## 2022-03-30 ENCOUNTER — Other Ambulatory Visit: Payer: Self-pay | Admitting: Internal Medicine

## 2022-03-30 DIAGNOSIS — I4892 Unspecified atrial flutter: Secondary | ICD-10-CM

## 2022-03-30 MED ORDER — APIXABAN 5 MG PO TABS
ORAL_TABLET | Freq: Two times a day (BID) | ORAL | 5 refills | Status: DC
  Filled 2022-03-30: qty 60, 30d supply, fill #0
  Filled 2022-05-07: qty 60, 30d supply, fill #1
  Filled 2022-06-14: qty 60, 30d supply, fill #2
  Filled 2022-07-27: qty 60, 30d supply, fill #3
  Filled 2022-09-01: qty 60, 30d supply, fill #4
  Filled 2022-10-05: qty 60, 30d supply, fill #5

## 2022-03-30 NOTE — Telephone Encounter (Signed)
Eliquis 5mg  refill request received. Patient is 76 years old, weight-73kg, Crea-0.94 on 12/07/2021, Diagnosis-Aflutter, and last seen by Dr. 02/06/2022 on 10/07/2021. Dose is appropriate based on dosing criteria. Will send in refill to requested pharmacy.

## 2022-04-10 ENCOUNTER — Telehealth: Payer: Self-pay | Admitting: Internal Medicine

## 2022-04-10 NOTE — Telephone Encounter (Signed)
   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 Advised to do blood work (ddone and negative), get echo (done and eff 55-60%), but ordere dPFT (not done yet) and ILD packet (he has taken home) and followup in few to several weeks to see me-> I do not PFT or appt to see -> please facilitaite   Thanks    SIGNATURE    Dr. Kalman Shan, M.D., F.C.C.P,  Pulmonary and Critical Care Medicine Staff Physician, [facility] Director - Interstitial Lung Disease  Program  Medical Director - [facility] Pulmonary Fibrosis [facility] [place], [place], [address]  NPI Number:  NPI #[id number] DEA Number: [id number]  Pager: [phone], If no answer  -> Check AMION or Try [phone] Telephone (clinical office): [phone] Telephone (research): [phone]  10:11 PM 04/10/2022        No data to display

## 2022-05-07 ENCOUNTER — Other Ambulatory Visit (HOSPITAL_BASED_OUTPATIENT_CLINIC_OR_DEPARTMENT_OTHER): Payer: Self-pay

## 2022-05-21 ENCOUNTER — Other Ambulatory Visit (HOSPITAL_BASED_OUTPATIENT_CLINIC_OR_DEPARTMENT_OTHER): Payer: Self-pay
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
## 2022-05-28 ENCOUNTER — Encounter: Payer: Self-pay | Admitting: Internal Medicine

## 2022-05-28 ENCOUNTER — Ambulatory Visit (INDEPENDENT_AMBULATORY_CARE_PROVIDER_SITE_OTHER): Payer: HMO | Admitting: Internal Medicine

## 2022-05-28 VITALS — BP 130/80 | HR 61 | Ht 69.0 in | Wt 167.8 lb

## 2022-05-28 DIAGNOSIS — E1159 Type 2 diabetes mellitus with other circulatory complications: Secondary | ICD-10-CM | POA: Diagnosis not present

## 2022-05-28 DIAGNOSIS — E785 Hyperlipidemia, unspecified: Secondary | ICD-10-CM

## 2022-05-28 DIAGNOSIS — E663 Overweight: Secondary | ICD-10-CM | POA: Diagnosis not present

## 2022-05-28 DIAGNOSIS — Z23 Encounter for immunization: Secondary | ICD-10-CM | POA: Diagnosis not present

## 2022-05-28 DIAGNOSIS — E1165 Type 2 diabetes mellitus with hyperglycemia: Secondary | ICD-10-CM | POA: Diagnosis not present

## 2022-05-28 LAB — POCT GLYCOSYLATED HEMOGLOBIN (HGB A1C): Hemoglobin A1C: 7 % — AB (ref 4.0–5.6)

## 2022-05-28 LAB — POCT GLUCOSE (DEVICE FOR HOME USE)
Glucose Fasting, POC: 28 mg/dL — AB (ref 70–99)
POC Glucose: 80 mg/dl (ref 70–99)
POC Glucose: 84 mg/dl (ref 70–99)

## 2022-05-28 MED ORDER — INSULIN DETEMIR 100 UNIT/ML ~~LOC~~ SOLN: SUBCUTANEOUS | 5 refills | Status: DC

## 2022-05-28 MED ORDER — INSULIN ASPART 100 UNIT/ML IJ SOLN: 2.0000 [IU] | Freq: Three times a day (TID) | INTRAMUSCULAR | 99 refills | Status: DC

## 2022-05-28 NOTE — Patient Instructions (Addendum)
Please decrease - Levemir 8 units at bedtime - Novolog mealtime 2-3 units only before large meals  Please return in 3-4 months with your sugar log.

## 2022-05-28 NOTE — Progress Notes (Signed)
Pt blood sugar checked and was 28. Pt given a can of Coke and apple juice. Rechecked sugar and it was only 30. Provider requested pt be given Glutose15 and monitored until his blood sugar increases. Pt moved to another room for observation and monitoring.  Patient's blood sugar increased to 80 provider informed. Advised to continue to monitor patient to ensure blood sugar has stabilized or increased. AC,RMA  Blood sugar recheck 15 minutes later reading at 84. Provider informed and she gave consent that the patient can be released given a apple juice (15 carbs) to take with him. AC,RMA

## 2022-06-02 ENCOUNTER — Ambulatory Visit (INDEPENDENT_AMBULATORY_CARE_PROVIDER_SITE_OTHER): Payer: HMO | Admitting: Family

## 2022-06-02 VITALS — BP 173/87 | HR 62 | Temp 97.7°F | Resp 16 | Wt 164.0 lb

## 2022-06-02 DIAGNOSIS — I4892 Unspecified atrial flutter: Secondary | ICD-10-CM

## 2022-06-02 DIAGNOSIS — I10 Essential (primary) hypertension: Secondary | ICD-10-CM

## 2022-06-02 DIAGNOSIS — E7800 Pure hypercholesterolemia, unspecified: Secondary | ICD-10-CM

## 2022-06-02 DIAGNOSIS — Z794 Long term (current) use of insulin: Secondary | ICD-10-CM | POA: Diagnosis not present

## 2022-06-02 DIAGNOSIS — I5032 Chronic diastolic (congestive) heart failure: Secondary | ICD-10-CM

## 2022-06-02 DIAGNOSIS — E118 Type 2 diabetes mellitus with unspecified complications: Secondary | ICD-10-CM

## 2022-06-02 LAB — LIPID PANEL
Cholesterol: 146 mg/dL (ref 0–200)
HDL: 47.5 mg/dL (ref >39.00)
LDL Cholesterol: 79 mg/dL (ref 0–99)
NonHDL: 98.15
Total CHOL/HDL Ratio: 3
Triglycerides: 96 mg/dL (ref 0.0–149.0)
VLDL: 19.2 mg/dL (ref 0.0–40.0)

## 2022-06-02 LAB — COMPREHENSIVE METABOLIC PANEL
ALT: 12 U/L (ref 0–53)
AST: 15 U/L (ref 0–37)
Albumin: 4 g/dL (ref 3.5–5.2)
Alkaline Phosphatase: 86 U/L (ref 39–117)
BUN: 18 mg/dL (ref 6–23)
CO2: 30 mEq/L (ref 19–32)
Calcium: 9.9 mg/dL (ref 8.4–10.5)
Chloride: 101 mEq/L (ref 96–112)
Creatinine, Ser: 0.81 mg/dL (ref 0.40–1.50)
GFR: 85.91 mL/min (ref >60.00)
Glucose, Bld: 203 mg/dL — ABNORMAL HIGH (ref 70–99)
Potassium: 4.5 mEq/L (ref 3.5–5.1)
Sodium: 139 mEq/L (ref 135–145)
Total Bilirubin: 1.1 mg/dL (ref 0.2–1.2)
Total Protein: 7 g/dL (ref 6.0–8.3)

## 2022-06-02 NOTE — Assessment & Plan Note (Signed)
BP Readings from Last 3 Encounters:  06/02/22 (!) 173/87  05/28/22 130/80  03/02/22 113/68   Maintained on Metoprolol xl 25mg .  Home readings look great.  Was OK on 10/20. Will hold off on changing medication at times.

## 2022-06-02 NOTE — Assessment & Plan Note (Addendum)
Lab Results  Component Value Date   HGBA1C 7.0 (A) 05/28/2022   HGBA1C 7.2 (H) 10/27/2021   HGBA1C 6.9 (A) 06/30/2021   Lab Results  Component Value Date   MICROALBUR 0.9 10/09/2019   LDLCALC 73 01/23/2021   CREATININE 0.94 12/07/2021   A1C is at goal for age. Management per Endo.

## 2022-06-02 NOTE — Assessment & Plan Note (Addendum)
Denies SOB today. Weight stable.  Wt Readings from Last 3 Encounters:  06/02/22 164 lb (74.4 kg)  05/28/22 167 lb 12.8 oz (76.1 kg)  03/02/22 161 lb (73 kg)   Lab Results  Component Value Date   CHOL 140 01/23/2021   HDL 46.70 01/23/2021   LDLCALC 73 01/23/2021   TRIG 102.0 01/23/2021   CHOLHDL 3 01/23/2021

## 2022-06-02 NOTE — Progress Notes (Signed)
Subjective:   By signing my name below, I, Ryan [name], attest that this documentation has been prepared under the direction and in the presence of Ryan Alar, NP. 06/02/2022    Patient ID: Ryan [name], male    DOB: [birth year], 76 y.o.   MRN: [id number]  Chief Complaint  Patient presents with   Hypertension    Here for follow up    Hypertension   Patient is in today for a follow up visit.   Blood pressure- His blood pressure is elevated during this visit. He continues taking 25 mg metoprolol succinate daily PO and reports that he took it this morning.  BP Readings from Last 3 Encounters:  06/02/22 (!) 173/87  05/28/22 130/80  03/02/22 113/68   Pulse Readings from Last 3 Encounters:  06/02/22 62  05/28/22 61  03/02/22 70   Covid-19- He reports recovering from Covid-19 recently. He had symptoms for 2 months. He has a follow up visit with his pulmonologist.   Eliquis- He continues taking 5 mg eliquis 2x daily PO. He denies having any SOB and leg swelling.   Diabetes- He continues following up with his endocrinologist regularly. He reports his blood sugars crashed and was measuring at 26 during his last appointment. He continues taking novolog insuling regularly.  Lab Results  Component Value Date   HGBA1C 7.0 (A) 05/28/2022   Immunizations- He is UTD on flu vaccine this year. He was recommended to receive the new Covid-19 booster vaccine.     Past Medical History:  Diagnosis Date   Allergy    Arthritis    Diabetes mellitus    GERD (gastroesophageal reflux disease)    History of chicken pox    Hyperlipidemia    Hypertension    Myocardial infarction ([place]) 2017   Type 2 diabetes mellitus with hyperglycemia, with long-term current use of insulin ([place]) 09/08/2015    Past Surgical History:  Procedure Laterality Date   EYE SURGERY  1998   bilateral   SHOULDER SURGERY  1990   right shoulder, torn rotator cuff.    Family History  Problem Relation Age  of Onset   Cancer Mother        cancer   Heart disease Mother    Hyperlipidemia Mother    Hypertension Mother    Hyperlipidemia Father    Stroke Father    Hypertension Father    Diabetes Paternal Grandfather    Colon cancer Neg Hx    Stomach cancer Neg Hx    Esophageal cancer Neg Hx     Social History   Socioeconomic History   Marital status: Widowed    Spouse name: Not on file   Number of children: 1   Years of education: Not on file   Highest education level: Not on file  Occupational History   Occupation: retired    Comment: from retail  Tobacco Use   Smoking status: Former    Types: Cigarettes    Quit date: 09/01/1965    Years since quitting: 56.7   Smokeless tobacco: Never   Tobacco comments:    Smoked about 1cig a week for about 6 months.  Vaping Use   Vaping Use: Never used  Substance and Sexual Activity   Alcohol use: No   Drug use: No   Sexual activity: Never  Other Topics Concern   Not on file  Social History Narrative   Regular exercise:  5 x weekly   Caffeine Use: 1 cups coffee daily.  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   Son, daughter in law and young adult granddaughter and grandson live with client   Wife died from ovarian cancer in 01-Nov-2014         Social Determinants of Health   Financial Resource Strain: Low Risk  (02/08/2022)   Overall Financial Resource Strain (CARDIA)    Difficulty of Paying Living Expenses: Not hard at all  Food Insecurity: No Food Insecurity (02/08/2022)   Hunger Vital Sign    Worried About Running Out of Food in the Last Year: Never true    Ran Out of Food in the Last Year: Never true  Transportation Needs: No Transportation Needs (02/08/2022)   PRAPARE - Hydrologist (Medical): No    Lack of Transportation (Non-Medical): No  Physical Activity: Sufficiently Active (02/08/2022)   Exercise Vital Sign    Days of Exercise per Week: 7 days    Minutes of Exercise per Session: 30 min  Stress: No Stress  Concern Present (02/08/2022)   [place]    Feeling of Stress : Not at all  Social Connections: Socially Isolated (02/02/2021)   Social Connection and Isolation Panel [NHANES]    Frequency of Communication with Friends and Family: More than three times a week    Frequency of Social Gatherings with Friends and Family: Once a week    Attends Religious Services: Never    Marine scientist or Organizations: No    Attends Archivist Meetings: Never    Marital Status: Widowed  Intimate Partner Violence: Not At Risk (02/08/2022)   Humiliation, Afraid, Rape, and Kick questionnaire    Fear of Current or Ex-Partner: No    Emotionally Abused: No    Physically Abused: No    Sexually Abused: No    Outpatient Medications Prior to Visit  Medication Sig Dispense Refill   apixaban (ELIQUIS) 5 MG TABS tablet TAKE 1 TABLET BY MOUTH TWICE DAILY 60 tablet 5   atorvastatin (LIPITOR) 40 MG tablet TAKE 1 TABLET (40 MG TOTAL) BY MOUTH DAILY. 90 tablet 1   blood glucose meter kit and supplies by Other route See admin instructions. Check blood sugar 6-7 times daily     Calcium Carbonate-Vitamin D 600-400 MG-UNIT tablet Take 1 tablet by mouth 2 (two) times daily. Reported on 01/27/2016     cholecalciferol (VITAMIN D3) 25 MCG (1000 UNIT) tablet Take 1,000 Units by mouth daily.     Glucagon, rDNA, (GLUCAGON EMERGENCY) 1 MG KIT Inject 1 mg into the muscle once as needed for up to 1 dose. (Patient taking differently: Inject 1 mg into the muscle as needed (blood sugar below 45).) 1 kit 12   insulin aspart (NOVOLOG) 100 UNIT/ML injection Inject 2-3 Units into the skin 3 (three) times daily before meals. 10 mL PRN   insulin detemir (LEVEMIR) 100 UNIT/ML injection INJECT 0.8 ML (8 UNITS) INTO THE SKIN AT BEDTIME 30 mL 5   Insulin Syringe-Needle U-100 (TRUEPLUS INSULIN SYRINGE) 31G X 5/16" 0.5 ML MISC USE TO INJECT INSULIN 4 TIMES A DAY 300 each 3    metoprolol succinate (TOPROL-XL) 25 MG 24 hr tablet TAKE 1 TABLET (25 MG TOTAL) BY MOUTH DAILY. 90 tablet 1   benzonatate (TESSALON) 100 MG capsule Take 1 capsule (100 mg total) by mouth 2 (two) times daily as needed for cough. 20 capsule 0   No facility-administered medications prior to visit.    Allergies  Allergen  Reactions   Other Other (See Comments)    Client states he became very sick and had significant skin inflammation and irritation with the Freestyle Libre sensor   Lisinopril     cough    ROS    See HPI Objective:    Physical Exam Constitutional:      General: He is not in acute distress.    Appearance: Normal appearance. He is not ill-appearing.  HENT:     Head: Normocephalic and atraumatic.     Right Ear: External ear normal.     Left Ear: External ear normal.  Eyes:     Extraocular Movements: Extraocular movements intact.     Pupils: Pupils are equal, round, and reactive to light.  Cardiovascular:     Rate and Rhythm: Normal rate and regular rhythm.     Heart sounds: Normal heart sounds. No murmur heard.    No gallop.     Comments: Blood pressure measured 165/75 during manual recheck Pulmonary:     Effort: Pulmonary effort is normal. No respiratory distress.     Breath sounds: No wheezing. Rales: faint.    Comments: Faint crackles heard Skin:    General: Skin is warm and dry.  Neurological:     Mental Status: He is alert and oriented to person, place, and time.  Psychiatric:        Judgment: Judgment normal.     BP (!) 173/87 (BP Location: Right Arm, Patient Position: Sitting, Cuff Size: Small)   Pulse 62   Temp 97.7 F (36.5 C) (Oral)   Resp 16   Wt 164 lb (74.4 kg)   SpO2 100%   BMI 24.22 kg/m  Wt Readings from Last 3 Encounters:  06/02/22 164 lb (74.4 kg)  05/28/22 167 lb 12.8 oz (76.1 kg)  03/02/22 161 lb (73 kg)     Assessment & Plan:   Problem List Items Addressed This Visit       Unprioritized   Hyperlipidemia - Primary    Relevant Orders   Lipid panel   HTN (hypertension)    BP Readings from Last 3 Encounters:  06/02/22 (!) 173/87  05/28/22 130/80  03/02/22 113/68  Maintained on Metoprolol xl $RemoveBefor'25mg'[id number]$ .  Home readings look great.  Was OK on 10/20. Will hold off on changing medication at times.      Controlled diabetes mellitus type 2 with complications [facility])    Lab Results  Component Value Date   HGBA1C 7.0 (A) 05/28/2022   HGBA1C 7.2 (H) 10/27/2021   HGBA1C 6.9 (A) 06/30/2021   Lab Results  Component Value Date   MICROALBUR 0.9 10/09/2019   LDLCALC 73 01/23/2021   CREATININE 0.94 12/07/2021  A1C is at goal for age. Management per Endo.       Relevant Orders   Comp Met (CMET)   Chronic diastolic CHF (congestive heart failure) ([place])    Denies SOB today. Weight stable.  Wt Readings from Last 3 Encounters:  06/02/22 164 lb (74.4 kg)  05/28/22 167 lb 12.8 oz (76.1 kg)  03/02/22 161 lb (73 kg)   Lab Results  Component Value Date   CHOL 140 01/23/2021   HDL 46.70 01/23/2021   LDLCALC 73 01/23/2021   TRIG 102.0 01/23/2021   CHOLHDL 3 01/23/2021        Atrial flutter ([facility])    Rate stable. Maintained on Eliquis.  Management per cardiology.         No orders of the defined types were placed in  this encounter.   I, Nance Pear, NP, personally preformed the services described in this documentation.  All medical record entries made by the scribe were at my direction and in my presence.  I have reviewed the chart and discharge instructions (if applicable) and agree that the record reflects my personal performance and is accurate and complete. 06/02/2022   I,Ryan [name],acting as a Education administrator for Nance Pear, NP.,have documented all relevant documentation on the behalf of Nance Pear, NP,as directed by  Nance Pear, NP while in the presence of Nance Pear, NP.   Nance Pear, NP

## 2022-06-02 NOTE — Assessment & Plan Note (Signed)
Rate stable. Maintained on Eliquis.  Management per cardiology.

## 2022-06-02 NOTE — Patient Instructions (Signed)
Please continue to monitor your blood pressure at home and let us know if your pressure runs high at home.

## 2022-06-03 ENCOUNTER — Telehealth: Payer: Self-pay

## 2022-06-03 NOTE — Telephone Encounter (Addendum)
[employer] renewal application to patient home.  I Received sign application back from patient    pt was receiving Levemir and Novolog from [facility], however Novo no longer on the program  [place]   faxed prescriber portion to office. 07/09/2022    [facility] Patient Advocate

## 2022-06-14 ENCOUNTER — Other Ambulatory Visit (HOSPITAL_BASED_OUTPATIENT_CLINIC_OR_DEPARTMENT_OTHER): Payer: Self-pay

## 2022-06-21 NOTE — Progress Notes (Signed)
OV 12/21/2021  Subjective:  Patient ID: Ryan [name], male , DOB: [birth year] , age 76 y.o. , MRN: [id number] , ADDRESS: [address] [place] [address] PCP [name], NP Patient Care Team: [name], NP as PCP - General (Internal Medicine) Fay Records, MD as PCP - Cardiology (Cardiology) [name], MD as Consulting Physician (Endocrinology) Fay Records, MD as Consulting Physician (Cardiology) Cherre Robins, [place] (Pharmacist) Luretha Rued, RN as Case Manager  This Provider for this visit: Treatment Team:  Attending Provider: Brand Males, MD    12/21/2021 -   Chief Complaint  Patient presents with   Consult    Pt had recent CT performed which is the reason for today's visit.  Pt denies any current complaints of cough, SOB, chest discomfort.     HPI Ryan [name] 76 y.o. -referred because of abnormal CT scan of the chest concern for interstitial lung disease.  He is a widower.  He lives alone in [place] area.  He used to work at [employer] but then retired after a right shoulder injury.  He says he quit cigarettes in 1967 and quit alcohol in 1969.  However he has been diagnosed with cirrhosis of the liver.  He sees Carl Best at [facility].  This was diagnosed/7/23 during incidental finding of a CT chest when his liver showed nodular appearance and current signs of cirrhosis.  March 2023 had normal liver function test and platelet count.  2001 colonoscopy was normal.  Mid-to-late March 2023 got admitted for acute kidney injury acidosis and poorly controlled diabetes.  Other than that he has type 2 diabetes, atrial flutter   He has history of CHF not otherwise specified.  Last echo September 2021.   Creatinine 0.9 mg percent in May 2023.  Bilirubin 1.6 mg percent Dec 07, 2021 but normal in March.  Normal hemoglobin.  PFT      View : No data to display.          Latest Reference Range & Units 12/07/21 14:22  A-1  Antitrypsin, Ser 83 - 199 mg/dL 142  Anti Nuclear Antibody (ANA) NEGATIVE  NEGATIVE  Mitochondrial M2 Ab, IgG <=20.0 U <=20.0  IgG (Immunoglobin G), Serum 600 - 1,540 mg/dL 1,149  AFP Tumor Marker <6.1 ng/mL 2.6        OV 06/22/2022  Subjective:  Patient ID: Ryan [name], male , DOB: Dec 08, 1945 , age 76 y.o. , MRN: [id number] , ADDRESS: [address] [place] [address] PCP [name], NP Patient Care Team: [name], NP as PCP - General (Internal Medicine) Fay Records, MD as PCP - Cardiology (Cardiology) [name], MD as Consulting Physician (Endocrinology) Fay Records, MD as Consulting Physician (Cardiology) Cherre Robins, [place] (Pharmacist)  This Provider for this visit: Treatment Team:  Attending Provider: Brand Males, MD    06/22/2022 -   Chief Complaint  Patient presents with   Follow-up    PFT performed today.  Pt states he has been doing okay since last visit and denies any complaints.     HPI Ryan [name] 76 y.o. -returns for follow-up.  He says he did the ILD questionnaire and dropped it off in September 2023.  However I do not have this with me today.  We went over the exposures.  He denies any agent orange exposure.  He did smoke in the past.  He is not aware of any family history of pulmonary fibrosis denies any  mold or mildew exposure.  Denies any further so far.  He quit smoking many years ago.  Currently is asymptomatic.  His autoimmune serology was reviewed and this is negative.  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   SYMPTOM SCALE - ILD 06/22/2022  Current weight   O2 use  ra  Shortness of Breath 0 -> 5 scale with 5 being worst (score 6 If unable to do)  At rest 0  Simple tasks - showers, clothes change, eating, shaving 00  Household (dishes, doing bed, laundry) 0  Shopping 0  Walking level at own pace 0  Walking up Stairs 0  Total (30-36) Dyspnea Score 0  How bad is your cough? 0  How bad is your fatigue 0  How bad is nausea 0  How bad is vomiting?  00  How bad is diarrhea? 0  How bad is anxiety? 0  How bad is depression 0  Any chronic pain - if so where and how bad 0      Simple office walk 185 feet x  3 laps goal with forehead probe 06/22/2022    O2 used ra   Number laps completed 3   Comments about pace avg   Resting Pulse Ox/HR 100% and 73/min   Final Pulse Ox/HR 100% and 89/min   Desaturated </= 88% no   Desaturated <= 3% points no   Got Tachycardic >/= 90/min no   Symptoms at end of test none   Miscellaneous comments x        Latest Reference Range & Units 12/21/21 15:03  Sed Rate 0 - 20 mm/hr 33 (H)  Cyclic Citrullin Peptide Ab UNITS <16  ds DNA Ab IU/mL <1  RA Latex Turbid. <14 IU/mL <14  SSA (Ro) (ENA) Antibody, IgG <1.0 NEG AI <1.0 NEG  SSB (La) (ENA) Antibody, IgG <1.0 NEG AI <1.0 NEG  Scleroderma (Scl-70) (ENA) Antibody, IgG <1.0 NEG AI <1.0 NEG  A.Fumigatus #1 Abs Negative  Negative  Micropolyspora faeni, IgG Negative  Negative  Thermoactinomyces vulgaris, IgG Negative  Negative  A. Pullulans Abs Negative  Negative  Thermoact. Saccharii Negative  Negative  Pigeon Serum Abs Negative  Negative  (H): Data is abnormally high     Latest Ref Rng & Units 06/22/2022    8:33 AM  PFT Results  FVC-Pre L 3.47  P  FVC-Predicted Pre % 89  P  FVC-Post L 3.51  P  FVC-Predicted Post % 90  P  Pre FEV1/FVC % % 85  P  Post FEV1/FCV % % 87  P  FEV1-Pre L 2.95  P  FEV1-Predicted Pre % 106  P  FEV1-Post L 3.06  P  DLCO uncorrected ml/min/mmHg 16.65  P  DLCO UNC% % 70  P  DLCO corrected ml/min/mmHg 16.65  P  DLCO COR  %Predicted % 70  P  DLVA Predicted % 87  P  TLC L 5.18  P  TLC % Predicted % 77  P  RV % Predicted % 65  P    P Preliminary result     CT CHEST WO CONTRAST April 2023  Narrative & Impression  CLINICAL DATA:  76 year old male with history of abnormal chest x-ray. Suspected interstitial lung disease.   EXAM: CT CHEST WITHOUT CONTRAST   TECHNIQUE: Multidetector CT imaging of the chest was performed following the standard protocol without IV contrast.   RADIATION DOSE REDUCTION: This exam was performed according to the departmental dose-optimization program which includes automated exposure control, adjustment of the mA and/or kV according to patient size and/or use of iterative reconstruction technique.   COMPARISON:  No priors.   FINDINGS: Cardiovascular: Heart size is normal. There is no significant pericardial fluid, thickening or pericardial calcification. There is aortic atherosclerosis, as well as atherosclerosis of the great vessels of the mediastinum and the coronary arteries, including calcified atherosclerotic plaque in the left main, left anterior descending, left circumflex and right coronary arteries.   Mediastinum/Nodes: No pathologically enlarged mediastinal or hilar lymph nodes. Please note that accurate exclusion of hilar adenopathy is limited on noncontrast CT scans. Esophagus is unremarkable in appearance. No axillary lymphadenopathy.   Lungs/Pleura: Patchy areas of peripheral predominant ground-glass attenuation and septal thickening are noted throughout the lungs bilaterally. Scattered areas of mild cylindrical bronchiectasis and peripheral bronchiolectasis are also noted. There appears to be a very mild craniocaudal gradient. No consolidative airspace disease. No pleural effusions. A few scattered calcified granulomas are noted. No other definite suspicious appearing pulmonary nodules or masses are noted.   Upper Abdomen: Aortic atherosclerosis.  Visualized portions of the liver have a shrunken appearance and nodular contour, indicative of underlying cirrhosis. Calcified granulomas in the spleen.   Musculoskeletal: There are no aggressive appearing lytic or blastic lesions noted in the visualized portions of the skeleton.   IMPRESSION: 1. The appearance of the chest is compatible with interstitial lung disease, with a spectrum of findings categorized as probable usual interstitial pneumonia (UIP) per current ATS guidelines. Outpatient referral to Pulmonology for further clinical evaluation is recommended. Follow-up high-resolution chest CT is also suggested in 12 months to assess for temporal changes in the appearance of the lung parenchyma. 2. Aortic atherosclerosis, in addition to left main and three-vessel coronary artery disease. Assessment for potential risk factor modification, dietary therapy or pharmacologic therapy may be warranted, if clinically indicated. 3. Cirrhosis. 4. Old granulomatous disease, as above.   Aortic Atherosclerosis (ICD10-I70.0).     Electronically Signed   By: Vinnie Langton M.D.   On: 11/14/2021 12:34      has a past medical history of Allergy, Arthritis, Diabetes mellitus, GERD (gastroesophageal reflux disease), History of chicken pox, Hyperlipidemia, Hypertension, Myocardial infarction ([place]) (2017), and Type 2 diabetes mellitus with hyperglycemia, with long-term current use of insulin ([place]) (09/08/2015).   reports that he quit smoking about 56 years ago. His smoking use included cigarettes. He has never used smokeless tobacco.  Past Surgical History:  Procedure Laterality Date   EYE SURGERY  1998   bilateral   SHOULDER SURGERY  1990   right shoulder, torn rotator cuff.    Allergies  Allergen Reactions   Other Other (See Comments)    Client states he became very sick and had significant skin inflammation and irritation with the Freestyle Libre sensor   Lisinopril     cough     Immunization History  Administered Date(s) Administered   Fluad Quad(high Dose 65+) 05/15/2019, 04/25/2020, 06/24/2021, 05/28/2022   H1N1 11/19/2008   Hepb-cpg 12/29/2021, 03/02/2022   Influenza Split 05/25/2011, 04/20/2012   Influenza Whole 06/06/2009   Influenza, High Dose Seasonal PF 04/20/2016, 05/12/2017, 05/26/2018   Influenza,inj,Quad PF,6+ Mos 05/07/2013,  05/27/2014, 05/13/2015   Influenza-Unspecified 05/09/2004, 06/09/2005, 06/15/2006, 07/11/2007, 05/15/2008, 06/06/2009   PFIZER(Purple Top)SARS-COV-2 Vaccination 09/30/2019, 10/24/2019, 07/22/2020   Pneumococcal Conjugate-13 10/19/2004, 05/11/2013   Pneumococcal Polysaccharide-23 05/25/2011, 09/08/2018   Td 08/09/2001, 08/25/2011   Zoster Recombinat (Shingrix) 01/22/2021, 05/20/2021   Zoster, Live 08/25/2011    Family History  Problem Relation Age of Onset   Cancer Mother        cancer   Heart disease Mother    Hyperlipidemia Mother    Hypertension Mother    Hyperlipidemia Father    Stroke Father    Hypertension Father    Diabetes Paternal Grandfather    Colon cancer Neg Hx    Stomach cancer Neg Hx    Esophageal cancer Neg Hx      Current Outpatient Medications:    apixaban (ELIQUIS) 5 MG TABS tablet, TAKE 1 TABLET BY MOUTH TWICE DAILY, Disp: 60 tablet, Rfl: 5   atorvastatin (LIPITOR) 40 MG tablet, TAKE 1 TABLET (40 MG TOTAL) BY MOUTH DAILY., Disp: 90 tablet, Rfl: 1   blood glucose meter kit and supplies, by Other route See admin instructions. Check blood sugar 6-7 times daily, Disp: , Rfl:    Calcium Carbonate-Vitamin D 600-400 MG-UNIT tablet, Take 1 tablet by mouth 2 (two) times daily. Reported on 01/27/2016, Disp: , Rfl:    cholecalciferol (VITAMIN D3) 25 MCG (1000 UNIT) tablet, Take 1,000 Units by mouth daily., Disp: , Rfl:    Glucagon, rDNA, (GLUCAGON EMERGENCY) 1 MG KIT, Inject 1 mg into the muscle once as needed for up to 1 dose. (Patient taking differently: Inject 1 mg into the muscle as needed (blood  sugar below 45).), Disp: 1 kit, Rfl: 12   insulin aspart (NOVOLOG) 100 UNIT/ML injection, Inject 2-3 Units into the skin 3 (three) times daily before meals., Disp: 10 mL, Rfl: PRN   insulin detemir (LEVEMIR) 100 UNIT/ML injection, INJECT 0.8 ML (8 UNITS) INTO THE SKIN AT BEDTIME, Disp: 30 mL, Rfl: 5   Insulin Syringe-Needle U-100 (TRUEPLUS INSULIN SYRINGE) 31G X 5/16" 0.5 ML MISC, USE TO INJECT INSULIN 4 TIMES A DAY, Disp: 300 each, Rfl: 3   metoprolol succinate (TOPROL-XL) 25 MG 24 hr tablet, TAKE 1 TABLET (25 MG TOTAL) BY MOUTH DAILY., Disp: 90 tablet, Rfl: 1      Objective:   Vitals:   06/22/22 0948  BP: 132/80  Pulse: 73  SpO2: 100%  Weight: 161 lb 12.8 oz (73.4 kg)  Height: '5\' 8"'$  (1.727 m)    Estimated body mass index is 24.6 kg/m as calculated from the following:   Height as of this encounter: '5\' 8"'$  (1.727 m).   Weight as of this encounter: 161 lb 12.8 oz (73.4 kg).  '@WEIGHTCHANGE'$ @  Filed Weights   06/22/22 0948  Weight: 161 lb 12.8 oz (73.4 kg)     Physical Exam    General: No distress. Looks well Neuro: Alert and Oriented x 3. GCS 15. Speech normal Psych: Pleasant Resp:  Barrel Chest - no.  Wheeze - no, Crackles - YES BASE, No overt respiratory distress CVS: Normal heart sounds. Murmurs - no Ext: Stigmata of Connective Tissue Disease - no HEENT: Normal upper airway. PEERL +. No post nasal drip        Assessment:       ICD-10-CM   1. IPF (idiopathic pulmonary fibrosis) ([place])  J84.112     2. ILD (interstitial lung disease) ([place])  J84.9     3. Bibasilar crackles  R09.89  Given diagnosis of IPF.  We discussed therapeutic options of standard of care nintedanib versus standard of care pirfenidone.  Went over the side effect profile of this in the monitoring profile and the benefits.  Also discussed clinical trials as a care option.  Particularly discussed modified [deutrified) pirfenidone that has lower GI side effects.  The protocol will require 6  months of placebo versus actual pirfenidone versus the research drug and then followed by randomization into 2 doses of the research drug.  Given the consent form for this.  He is going to process all this information.  He is aware of clinical trials.  His wife had cancer and she was in a clinical trial.    1. Scientific Purpose  Clinical research is designed to produce generalizable knowledge and to answer questions about the safety and efficacy of intervention(s) under study in order to determine whether or not they may be useful for the care of future patients.  2. Study Procedures  Participation in a trial may involve procedures or tests, in addition to the intervention(s) under study, that are intended only or primarily to generate scientific knowledge and that are otherwise not necessary for patient care.   3. Uncertainty  For intervention(s) under study in clinical research, there often is less knowledge and more uncertainty about the risks and benefits to a population of trial participants than there is when a doctor offers a patient standard interventions.   4. Adherence to Protocol  Administration of the intervention(s) under study is typically based on a strict protocol with defined dose, scheduling, and use or avoidance of concurrent medications, compared to administration of standard interventions.  5. Clinician as Investigator  Clinicians who are in health care settings provide treatment; in a clinical trial setting, they are also investigating safety and efficacy of an intervention. In otherwise your doctor or nurse practitioner can be wearing 2 hats - one as care giver another as Company secretary  6. Patient as Visual merchandiser Subject  Patients participating in research trials are research subjects or volunteers. In other words participating in research is 100% voluntary and at one's own free weill. The decision to participate or not participate will NOT affect  patient care and the doctor-patient relationship in any way  7. Financial Conflict of Interest Disclosure  One or more of the investigators of  the research trial might have an investment interest in [facility] the clinical trials practice through which the study is being conducted. Study sponsor pays the practice for the conduct of the study.      Plan:     Patient Instructions     ICD-10-CM   1. IPF (idiopathic pulmonary fibrosis) ([place])  J84.112     2. ILD (interstitial lung disease) ([place])  J84.9     3. Bibasilar crackles  R09.89        Given your diagnosis of idiopathic pulmonary fibrosis [IPF] based on age greater than 5, lower lobe crackles, Caucasian ethnicity, male gender and also probable UIP on CT scan with negative serology  Current burden of the disease is mild based on no symptoms and only mild reduction in diffusion capacity but you definitely have the disease  Future course is 1 of high risk for progression over the next few to several years  PLan -Discussed options of taking standard of care nintedanib versus taking standard of care pirfenidone versus clinica trial with modified-Pirfenidone which lowers side effects (PURETECH study)  - repeat sprioemtry and dlco in  3 months  FOllowup  - 4 weeks visit to discuss decision - 15 min visit - 12 weeks do spiro and dlco =- 12 weeks - 30 min visit buafter PFT  ( Level 05 visit: Estb 40-54 min visit type: on-site physical face to visit  in total care time and counseling or/and coordination of care by this undersigned MD - Dr Brand Males. This includes one or more of the following on this same day 06/22/2022: pre-charting, chart review, note writing, documentation discussion of test results, diagnostic or treatment recommendations, prognosis, risks and benefits of management options, instructions, education, compliance or risk-factor reduction. It excludes time spent by the [place] or office staff in the care of the  patient. Actual time 40 min)   SIGNATURE    Dr. Brand Males, M.D., F.C.C.P,  Pulmonary and Critical Care Medicine Staff Physician, [place] Director - Interstitial Lung Disease  Program  [facility] [place], [place], [address]  Pager: [phone], If no answer or between  15:00h - 7:00h: call [phone] Telephone: [phone]  11:24 AM 06/22/2022

## 2022-06-21 NOTE — Patient Instructions (Signed)
ICD-10-CM   1. IPF (idiopathic pulmonary fibrosis) ([facility])  J84.112     2. ILD (interstitial lung disease) ([facility])  J84.9     3. 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 CT scan with negative serology  Current burden of the disease is mild based on no symptoms and only mild reduction in diffusion capacity but you definitely have the disease  Future course is 1 of high risk for progression over the next few to several years  PLan -Discussed options of taking standard of care nintedanib versus taking standard of care pirfenidone versus clinica trial with modified-Pirfenidone which lowers side effects (PURETECH study)  - repeat sprioemtry and dlco in 3 months  FOllowup  - 4 weeks visit to discuss decision - 15 min visit - 12 weeks do spiro and dlco =- 12 weeks - 30 min visit buafter PFT

## 2022-06-22 ENCOUNTER — Encounter: Payer: Self-pay | Admitting: Internal Medicine

## 2022-06-22 ENCOUNTER — Ambulatory Visit (INDEPENDENT_AMBULATORY_CARE_PROVIDER_SITE_OTHER): Payer: HMO | Admitting: Internal Medicine

## 2022-06-22 ENCOUNTER — Telehealth: Payer: Self-pay | Admitting: Internal Medicine

## 2022-06-22 VITALS — BP 132/80 | HR 73 | Ht 68.0 in | Wt 161.8 lb

## 2022-06-22 DIAGNOSIS — J84112 Idiopathic pulmonary fibrosis: Secondary | ICD-10-CM | POA: Diagnosis not present

## 2022-06-22 DIAGNOSIS — J849 Interstitial pulmonary disease, unspecified: Secondary | ICD-10-CM

## 2022-06-22 DIAGNOSIS — R0989 Other specified symptoms and signs involving the circulatory and respiratory systems: Secondary | ICD-10-CM | POA: Diagnosis not present

## 2022-06-22 LAB — PULMONARY FUNCTION TEST
DL/VA % pred: 87 %
DL/VA: 3.49 ml/min/mmHg/L
DLCO cor % pred: 70 %
DLCO cor: 16.65 ml/min/mmHg
DLCO unc % pred: 70 %
DLCO unc: 16.65 ml/min/mmHg
FEF 25-75 Post: 5.2 L/sec
FEF 25-75 Pre: 4.35 L/sec
FEF2575-%Change-Post: 19 %
FEF2575-%Pred-Post: 261 %
FEF2575-%Pred-Pre: 218 %
FEV1-%Change-Post: 3 %
FEV1-%Pred-Post: 110 %
FEV1-%Pred-Pre: 106 %
FEV1-Post: 3.06 L
FEV1-Pre: 2.95 L
FEV1FVC-%Change-Post: 2 %
FEV1FVC-%Pred-Pre: 117 %
FEV6-%Change-Post: 1 %
FEV6-%Pred-Post: 97 %
FEV6-%Pred-Pre: 95 %
FEV6-Post: 3.51 L
FEV6-Pre: 3.45 L
FEV6FVC-%Change-Post: 0 %
FEV6FVC-%Pred-Post: 107 %
FEV6FVC-%Pred-Pre: 106 %
FVC-%Change-Post: 1 %
FVC-%Pred-Post: 90 %
FVC-%Pred-Pre: 89 %
FVC-Post: 3.51 L
FVC-Pre: 3.47 L
Post FEV1/FVC ratio: 87 %
Post FEV6/FVC ratio: 100 %
Pre FEV1/FVC ratio: 85 %
Pre FEV6/FVC Ratio: 99 %
RV % pred: 65 %
RV: 1.62 L
TLC % pred: 77 %
TLC: 5.18 L

## 2022-06-22 NOTE — Patient Instructions (Signed)
Full PFT Performed Today  

## 2022-06-22 NOTE — Telephone Encounter (Signed)
Ryan [name]  You are seeing Ryan [name] mid December 2023.  I did discuss at 3 therapeutic options with him which are nintedanib versus pirfenidone versus modified pirfenidone through the puretech study  When he sees you at the follow-up visit -discussion about final decision to be made.  Please reach out to me if there are any questions on the day  Thanks for support  Reply not needed and closing message

## 2022-06-22 NOTE — Progress Notes (Signed)
Full PFT Performed Today  

## 2022-07-12 NOTE — Telephone Encounter (Signed)
We can use Tresiba U100 for him. Thank you! CG

## 2022-07-12 NOTE — Telephone Encounter (Signed)
Sorry [name], no I meant to say that Just Levemir is no longer under the program..

## 2022-07-12 NOTE — Telephone Encounter (Signed)
Patient last saw endo 05/2022. A1c was 7.0% and dose of Levemir decreased to 8 units daily.  Will forward information about changed in availability of Levemir in 2024 to Dr Elvera Lennox for recommendations of alternative.  Could switch to [place] but might need to adjust dose a little.

## 2022-07-13 MED ORDER — TRESIBA FLEXTOUCH 100 UNIT/ML ~~LOC~~ SOPN: 8.0000 [IU] | PEN_INJECTOR | Freq: Every day | SUBCUTANEOUS | 0 refills | Status: DC

## 2022-07-13 NOTE — Telephone Encounter (Signed)
Patient is aware of change in insulin.

## 2022-07-13 NOTE — Addendum Note (Signed)
Addended by: Henrene Pastor B on: 07/13/2022 12:32 PM   Modules accepted: Orders

## 2022-07-13 NOTE — Telephone Encounter (Signed)
Dr Elvera Lennox OK'd change from Levemir [facility] is discontinuing production in 2024) to [place].   Will take Tresiba 8 units daily. [facility] medication assistance  team is working on 2024 application for [employer] medication assistance program.

## 2022-07-13 NOTE — Telephone Encounter (Signed)
Noted change  to [place] and resent prescriber portion for PAP signature

## 2022-07-20 ENCOUNTER — Encounter: Payer: Self-pay | Admitting: Adult Health

## 2022-07-20 ENCOUNTER — Ambulatory Visit (INDEPENDENT_AMBULATORY_CARE_PROVIDER_SITE_OTHER): Payer: PPO | Admitting: Adult Health

## 2022-07-20 VITALS — BP 110/70 | HR 61 | Temp 97.8°F | Ht 69.0 in | Wt 162.4 lb

## 2022-07-20 DIAGNOSIS — J84112 Idiopathic pulmonary fibrosis: Secondary | ICD-10-CM | POA: Diagnosis not present

## 2022-07-20 NOTE — Progress Notes (Signed)
_0  ID: Ryan [name], male    DOB: [birth year], 76 y.o.   MRN: [id number]  Chief Complaint  Patient presents with   Follow-up    Referring provider: [name], NP  HPI: 76 year old male followed for IPF   TEST/EVENTS :  PFTs June 22, 2022 FEV1 110%, ratio 87, FVC 90%, no significant bronchodilator response, DLCO 70%  07/20/2022 Follow up : IPF  Patient returns for 1 month follow-up.  Patient has underlying IPF.  Patient was seen last visit with PFTs showing no airflow obstruction or restriction and decreased diffusing capacity at 70%.  Previous CT chest April 2023 showed patchy areas of groundglass attenuation and septal thickening bilaterally in the lungs.  Mild bronchiectasis.  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 Previous autoimmune/connective tissue workup was unrevealing.  Patient says that he is somewhat active around the house.  Does get short of breath heavy activities but has no dyspnea at rest denies any significant cough.  Patient does have severe diabetes and does not drive.  His son and son's family lives with him at home.  They help with his transportation issues.    Last visit treatment options including standard of care with antifibrotic such as Ofev and Esbriet were discussed.  Also discussed research trials such as [employer] LYT-100 .  Patient says he thought over treatment options and would like to see if he is a candidate for the research trial.  We went over again how research trials work.  Patient verbalizes understanding.    Allergies  Allergen Reactions   Other Other (See Comments)    Client states he became very sick and had significant skin inflammation and irritation with the Freestyle Libre sensor   Lisinopril     cough    Immunization History  Administered Date(s) Administered   Fluad Quad(high Dose 65+) 05/15/2019, 04/25/2020, 06/24/2021, 05/28/2022   H1N1 11/19/2008   Hepb-cpg 12/29/2021, 03/02/2022   Influenza Split 05/25/2011, 04/20/2012    Influenza Whole 06/06/2009   Influenza, High Dose Seasonal PF 04/20/2016, 05/12/2017, 05/26/2018   Influenza,inj,Quad PF,6+ Mos 05/07/2013, 05/27/2014, 05/13/2015   Influenza-Unspecified 05/09/2004, 06/09/2005, 06/15/2006, 07/11/2007, 05/15/2008, 06/06/2009   PFIZER(Purple Top)SARS-COV-2 Vaccination 09/30/2019, 10/24/2019, 07/22/2020   Pneumococcal Conjugate-13 10/19/2004, 05/11/2013   Pneumococcal Polysaccharide-23 05/25/2011, 09/08/2018   Td 08/09/2001, 08/25/2011   Zoster Recombinat (Shingrix) 01/22/2021, 05/20/2021   Zoster, Live 08/25/2011    Past Medical History:  Diagnosis Date   Allergy    Arthritis    Diabetes mellitus    GERD (gastroesophageal reflux disease)    History of chicken pox    Hyperlipidemia    Hypertension    Myocardial infarction ([place]) 2017   Type 2 diabetes mellitus with hyperglycemia, with long-term current use of insulin ([place]) 09/08/2015    Tobacco History: Social History   Tobacco Use  Smoking Status Former   Types: Cigarettes   Quit date: 09/01/1965   Years since quitting: 56.9  Smokeless Tobacco Never  Tobacco Comments   Smoked about 1cig a week for about 6 months.   Counseling given: Not Answered Tobacco comments: Smoked about 1cig a week for about 6 months.   Outpatient Medications Prior to Visit  Medication Sig Dispense Refill   apixaban (ELIQUIS) 5 MG TABS tablet TAKE 1 TABLET BY MOUTH TWICE DAILY 60 tablet 5   atorvastatin (LIPITOR) 40 MG tablet TAKE 1 TABLET (40 MG TOTAL) BY MOUTH DAILY. 90 tablet 1   blood glucose meter kit and supplies by Other route See admin instructions. Check blood sugar 6-7  times daily     Calcium Carbonate-Vitamin D 600-400 MG-UNIT tablet Take 1 tablet by mouth 2 (two) times daily. Reported on 01/27/2016     cholecalciferol (VITAMIN D3) 25 MCG (1000 UNIT) tablet Take 1,000 Units by mouth daily.     Glucagon, rDNA, (GLUCAGON EMERGENCY) 1 MG KIT Inject 1 mg into the muscle once as needed for up to 1 dose.  (Patient taking differently: Inject 1 mg into the muscle as needed (blood sugar below 45).) 1 kit 12   insulin aspart (NOVOLOG) 100 UNIT/ML injection Inject 2-3 Units into the skin 3 (three) times daily before meals. 10 mL PRN   insulin degludec (TRESIBA FLEXTOUCH) 100 UNIT/ML FlexTouch Pen Inject 8 Units into the skin daily. Will start in 2024 after he completes current supply of Levemir. 15 mL 0   insulin detemir (LEVEMIR) 100 UNIT/ML injection INJECT 0.8 ML (8 UNITS) INTO THE SKIN AT BEDTIME 30 mL 5   Insulin Syringe-Needle U-100 (TRUEPLUS INSULIN SYRINGE) 31G X 5/16" 0.5 ML MISC USE TO INJECT INSULIN 4 TIMES A DAY 300 each 3   metoprolol succinate (TOPROL-XL) 25 MG 24 hr tablet TAKE 1 TABLET (25 MG TOTAL) BY MOUTH DAILY. 90 tablet 1   No facility-administered medications prior to visit.     Review of Systems:   Constitutional:   No  weight loss, night sweats,  Fevers, chills,  +fatigue, or  lassitude.  HEENT:   No headaches,  Difficulty swallowing,  Tooth/dental problems, or  Sore throat,                No sneezing, itching, ear ache, nasal congestion, post nasal drip,   CV:  No chest pain,  Orthopnea, PND, swelling in lower extremities, anasarca, dizziness, palpitations, syncope.   GI  No heartburn, indigestion, abdominal pain, nausea, vomiting, diarrhea, change in bowel habits, loss of appetite, bloody stools.   Resp:  No excess mucus, no productive cough,  No non-productive cough,  No coughing up of blood.  No change in color of mucus.  No wheezing.  No chest wall deformity  Skin: no rash or lesions.  GU: no dysuria, change in color of urine, no urgency or frequency.  No flank pain, no hematuria   MS:  No joint pain or swelling.  No decreased range of motion.  No back pain.    Physical Exam  BP 110/70 (BP Location: Left Arm, Patient Position: Sitting, Cuff Size: Normal)   Pulse 61   Temp 97.8 F (36.6 C) (Oral)   Ht 5' 9" (1.753 m)   Wt 162 lb 6.4 oz (73.7 kg)   SpO2  97%   BMI 23.98 kg/m   GEN: A/Ox3; pleasant , NAD, well nourished    HEENT:  [place]/AT,  EACs-clear, TMs-wnl, NOSE-clear, THROAT-clear, no lesions, no postnasal drip or exudate noted.   NECK:  Supple w/ fair ROM; no JVD; normal carotid impulses w/o bruits; no thyromegaly or nodules palpated; no lymphadenopathy.    RESP bibasilar crackles , no accessory muscle use, no dullness to percussion  CARD:  RRR, no m/r/g, no peripheral edema, pulses intact, no cyanosis or clubbing.  GI:   Soft & nt; nml bowel sounds; no organomegaly or masses detected.   Musco: Warm bil, no deformities or joint swelling noted.   Neuro: alert, no focal deficits noted.    Skin: Warm, no lesions or rashes    Lab Results:  CBC   BMET   BNP No results found for: "BNP"  ProBNP  No results found for: "PROBNP"  Imaging: No results found.       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  Autoimmune/connective tissue workup unrevealing.  Treatment options including standard of care with Ofev and Esbriet discussed in detail with patient.  Options for research trial was discussed.  Patient would like to be considered for possible research trial with [employer] LYT-100 .  Referral was made to [employer] for research trial .  Did discuss with patient if he is not a study candidate.  We will discuss on return visit consideration of antifibrotic  therapy with Ofev or Esbriet.  PFTs on return.  Plan  Patient Instructions  Refer to [facility] for consideration for clinical trial with modified-Pirfenidone which lowers side effects (PURETECH study)  Follow up with Dr. Chase Caller [facility] office visit - repeat sprioemtry and dlco in 3 months       Rexene Edison, NP 07/20/2022

## 2022-07-20 NOTE — Patient Instructions (Signed)
Refer to [facility] for consideration for clinical trial with modified-Pirfenidone which lowers side effects (PURETECH study)  Follow up with Dr. Marchelle Gearing [place] [facility] office visit - repeat sprioemtry and dlco in 3 months

## 2022-07-20 NOTE — Assessment & Plan Note (Signed)
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  Autoimmune/connective tissue workup unrevealing.  Treatment options including standard of care with Ofev and Esbriet discussed in detail with patient.  Options for research trial was discussed.  Patient would like to be considered for possible research trial with [employer] LYT-100 .  Referral was made to [facility] for research trial .  Did discuss with patient if he is not a study candidate.  We will discuss on return visit consideration of antifibrotic therapy with Ofev or Esbriet.  PFTs on return.  Plan  Patient Instructions  Refer to [facility] for consideration for clinical trial with modified-Pirfenidone which lowers side effects (PURETECH study)  Follow up with Dr. Marchelle Gearing [place] [facility] office visit - repeat sprioemtry and dlco in 3 months

## 2022-07-27 ENCOUNTER — Other Ambulatory Visit (HOSPITAL_BASED_OUTPATIENT_CLINIC_OR_DEPARTMENT_OTHER): Payer: Self-pay

## 2022-08-17 ENCOUNTER — Ambulatory Visit: Payer: Self-pay | Admitting: Internal Medicine

## 2022-08-17 NOTE — Progress Notes (Addendum)
Interstitial Lung Disease Multidisciplinary Conference   Ryan [name]    MRN [id number]    DOB 02-15-1946  Primary Care 22, [name], NP  Referring Physician: Dr. Chase Caller  Time of Conference: 7.00am- 8.00am Date of conference: 08/17/2022 Location of Conference: -  Virtual  Participating Pulmonary: Dr. Brand Males,  Dr Marshell Garfinkel, Pathology:  Radiology: Dr Yetta Glassman  Others:   Brief History: 77 yo male. MR has given dx of IPF based on age, male, white, neg serology, and prob UIP on outside CT. Mild disease. DLCO 70%. FVC normal. CT is from April 2023. Questions:  a) does our radiology agree with outside CT b) does the group agree is IPF c) patient wants to go on [employer] trial    PFT    Latest Ref Rng & Units 06/22/2022    8:33 AM  PFT Results  FVC-Pre L 3.47   FVC-Predicted Pre % 89   FVC-Post L 3.51   FVC-Predicted Post % 90   Pre FEV1/FVC % % 85   Post FEV1/FCV % % 87   FEV1-Pre L 2.95   FEV1-Predicted Pre % 106   FEV1-Post L 3.06   DLCO uncorrected ml/min/mmHg 16.65   DLCO UNC% % 70   DLCO corrected ml/min/mmHg 16.65   DLCO COR %Predicted % 70   DLVA Predicted % 87   TLC L 5.18   TLC % Predicted % 77   RV % Predicted % 65       MDD discussion of CT scan    - Date or time period of scan:  CT Chest WO CM: 11/13/2021  - Discussion synopsis:  Per Dr Burt Ek - he has subpleural retitculaton, Traction broncheicasis +. No honeycomb. Craniocaudral grandient +.   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 CONCORDANT +  - Concordance with official report:  CONCORDANT +  Pathology discussion of biopsy: n/a    MDD Impression/Recs: IPF dx manage -> PURE[employer] trial as his choice   Time Spent in preparation and discussion:  > 30 min    SIGNATURE   Dr. Brand Males, M.D., F.C.C.P,  Pulmonary and Critical Care Medicine Staff Physician, [place] Director - Interstitial Lung Disease  Program  [facility] [place], [place], [address]  Pager: [phone], If no answer or between  15:00h - 7:00h: call [phone] Telephone: [phone]  1:30 PM 08/17/2022 ...................................................................................................................Marland Kitchen References: Diagnosis of Hypersensitivity Pneumonitis in Adults. An Official ATS/JRS/ALAT Clinical Practice Guideline. Ragu G et al, [place] Aug 1;202(3):e36-e69.       Diagnosis of Idiopathic Pulmonary Fibrosis. An Official ATS/ERS/JRS/ALAT Clinical Practice Guideline. Raghu G et al, [place]. 2018 Sep 1;198(5):e44-e68.   IPF Suspected   Histopath ology Pattern      UIP  Probable UIP  Indeterminate for  UIP  Alternative  diagnosis    UIP  IPF  IPF  IPF  Non-IPF dx   HRCT   Probabe UIP  IPF  IPF  IPF (Likely)**  Non-IPF dx  Pattern  Indeterminate for UIP  IPF  IPF (Likely)**  Indeterminate  for IPF**  Non-IPF dx    Alternative diagnosis  IPF (Likely)**/ non-IPF dx  Non-IPF dx  Non-IPF dx  Non-IPF dx     Idiopathic pulmonary fibrosis diagnosis based upon HRCT and Biopsy paterns.  **  IPF is the likely diagnosis when any of following features are present:  Moderate-to-severe traction bronchiectasis/bronchiolectasis (defined as mild traction bronchiectasis/bronchiolectasis in four or more lobes including the lingual as a lobe, or moderate to severe traction bronchiectasis in two or more lobes) in a man over age 33 years or in a woman over age 43 years Extensive (>30%) reticulation on HRCT and an age >70 years  Increased neutrophils and/or absence of lymphocytosis in BAL fluid  Multidisciplinary discussion reaches a confident diagnosis of IPF.   **Indeterminate for IPF  Without an adequate biopsy is unlikely to be IPF   With an adequate biopsy may be reclassified to a more specific diagnosis after multidisciplinary discussion and/or additional consultation.   dx = diagnosis; HRCT = high-resolution computed tomography; IPF = idiopathic pulmonary fibrosis; UIP = usual interstitial pneumonia.

## 2022-09-01 ENCOUNTER — Encounter: Payer: Self-pay | Admitting: Family

## 2022-09-01 ENCOUNTER — Other Ambulatory Visit (HOSPITAL_BASED_OUTPATIENT_CLINIC_OR_DEPARTMENT_OTHER): Payer: Self-pay

## 2022-09-01 ENCOUNTER — Other Ambulatory Visit: Payer: Self-pay | Admitting: Family

## 2022-09-01 ENCOUNTER — Ambulatory Visit (INDEPENDENT_AMBULATORY_CARE_PROVIDER_SITE_OTHER): Payer: HMO | Admitting: Family

## 2022-09-01 VITALS — BP 127/81 | HR 71 | Temp 98.0°F | Resp 16 | Wt 167.0 lb

## 2022-09-01 DIAGNOSIS — E7800 Pure hypercholesterolemia, unspecified: Secondary | ICD-10-CM | POA: Diagnosis not present

## 2022-09-01 DIAGNOSIS — M8580 Other specified disorders of bone density and structure, unspecified site: Secondary | ICD-10-CM

## 2022-09-01 DIAGNOSIS — J84112 Idiopathic pulmonary fibrosis: Secondary | ICD-10-CM

## 2022-09-01 DIAGNOSIS — E118 Type 2 diabetes mellitus with unspecified complications: Secondary | ICD-10-CM

## 2022-09-01 DIAGNOSIS — I10 Essential (primary) hypertension: Secondary | ICD-10-CM | POA: Diagnosis not present

## 2022-09-01 DIAGNOSIS — Z794 Long term (current) use of insulin: Secondary | ICD-10-CM | POA: Diagnosis not present

## 2022-09-01 LAB — COMPREHENSIVE METABOLIC PANEL
ALT: 22 U/L (ref 0–53)
AST: 23 U/L (ref 0–37)
Albumin: 3.9 g/dL (ref 3.5–5.2)
Alkaline Phosphatase: 84 U/L (ref 39–117)
BUN: 18 mg/dL (ref 6–23)
CO2: 29 mEq/L (ref 19–32)
Calcium: 9.3 mg/dL (ref 8.4–10.5)
Chloride: 101 mEq/L (ref 96–112)
Creatinine, Ser: 0.9 mg/dL (ref 0.40–1.50)
GFR: 83.08 mL/min (ref >60.00)
Glucose, Bld: 125 mg/dL — ABNORMAL HIGH (ref 70–99)
Potassium: 4.3 mEq/L (ref 3.5–5.1)
Sodium: 138 mEq/L (ref 135–145)
Total Bilirubin: 1.1 mg/dL (ref 0.2–1.2)
Total Protein: 6.9 g/dL (ref 6.0–8.3)

## 2022-09-01 LAB — MICROALBUMIN / CREATININE URINE RATIO
Creatinine,U: 117.6 mg/dL
Microalb Creat Ratio: 1.3 mg/g (ref 0.0–30.0)
Microalb, Ur: 1.5 mg/dL (ref 0.0–1.9)

## 2022-09-01 MED ORDER — METOPROLOL SUCCINATE ER 25 MG PO TB24
ORAL_TABLET | Freq: Every day | ORAL | 1 refills | Status: DC
  Filled 2022-09-01: qty 90, 90d supply, fill #0
  Filled 2022-12-08: qty 90, 90d supply, fill #1

## 2022-09-01 MED ORDER — ATORVASTATIN CALCIUM 40 MG PO TABS
40.0000 mg | ORAL_TABLET | Freq: Every day | ORAL | 1 refills | Status: DC
  Filled 2022-09-01: qty 90, 90d supply, fill #0
  Filled 2022-12-08: qty 90, 90d supply, fill #1

## 2022-09-01 NOTE — Assessment & Plan Note (Signed)
This is a new diagnosis for him and is being managed by pulmonology.

## 2022-09-01 NOTE — Assessment & Plan Note (Signed)
Lab Results  Component Value Date   CHOL 146 06/02/2022   HDL 47.50 06/02/2022   LDLCALC 79 06/02/2022   TRIG 96.0 06/02/2022   CHOLHDL 3 06/02/2022   LDL at goal, continue lipitor.

## 2022-09-01 NOTE — Assessment & Plan Note (Signed)
BP elevated upon arrival today but home bp reading this AM and recently was WNL.  No changes at this time.  BP Readings from Last 3 Encounters:  09/01/22 127/81  07/20/22 110/70  06/22/22 132/80   Pt is maintained on metoprolol.

## 2022-09-01 NOTE — Progress Notes (Addendum)
Subjective:   By signing my name below, I, Ryan [name], attest that this documentation has been prepared under the direction and in the presence of [name], NP. 09/01/2022   Patient ID: Ryan [name], male    DOB: 1945/11/18, 77 y.o.   MRN: [id number]  Chief Complaint  Patient presents with   Hyperlipidemia    Here for follow up   Diabetes    Here for follow up   Hypertension    Hyperlipidemia  Diabetes  Hypertension   Patient is in today for a follow up visit.   IPF: He was diagnosed if idiopathic pulmonary fibrosis from his pulmonologist. He does not use tobacco products.   Blood pressure: His blood pressures is elevated during this visit. He measured his blood pressure this morning and it was within normal range.  BP Readings from Last 3 Encounters:  09/01/22 127/81  07/20/22 110/70  06/22/22 132/80   Pulse Readings from Last 3 Encounters:  09/01/22 71  07/20/22 61  06/22/22 73   A1c: He continues measuring his blood sugar regularly and reports it measures normal. He continues following up with his endocrinologist regularly.  Lab Results  Component Value Date   HGBA1C 7.0 (A) 05/28/2022    Past Medical History:  Diagnosis Date   Allergy    Arthritis    Diabetes mellitus    GERD (gastroesophageal reflux disease)    History of chicken pox    Hyperlipidemia    Hypertension    IPF (idiopathic pulmonary fibrosis) ([place])    Myocardial infarction ([place]) 11-21-2015   Type 2 diabetes mellitus with hyperglycemia, with long-term current use of insulin ([place]) 09/08/2015    Past Surgical History:  Procedure Laterality Date   EYE SURGERY  1998   bilateral   SHOULDER SURGERY  1990   right shoulder, torn rotator cuff.    Family History  Problem Relation Age of Onset   Cancer Mother        cancer   Heart disease Mother    Hyperlipidemia Mother    Hypertension Mother    Hyperlipidemia Father    Stroke Father    Hypertension Father    Diabetes Paternal  Grandfather    Colon cancer Neg Hx    Stomach cancer Neg Hx    Esophageal cancer Neg Hx     Social History   Socioeconomic History   Marital status: Widowed    Spouse name: Not on file   Number of children: 1   Years of education: Not on file   Highest education level: Not on file  Occupational History   Occupation: retired    Comment: from retail  Tobacco Use   Smoking status: Former    Types: Cigarettes    Quit date: 09/01/1965    Years since quitting: 57.0   Smokeless tobacco: Never   Tobacco comments:    Smoked about 1cig a week for about 6 months.  Vaping Use   Vaping Use: Never used  Substance and Sexual Activity   Alcohol use: No   Drug use: No   Sexual activity: Never  Other Topics Concern   Not on file  Social History Narrative   Regular exercise:  5 x weekly   Caffeine Use: 1 cups coffee daily.   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   Son, daughter in law and young adult granddaughter and grandson live with client   Wife died from ovarian cancer in 11/21/2014  Social Determinants of Health   Financial Resource Strain: Low Risk  (02/08/2022)   Overall Financial Resource Strain (CARDIA)    Difficulty of Paying Living Expenses: Not hard at all  Food Insecurity: No Food Insecurity (02/08/2022)   Hunger Vital Sign    Worried About Running Out of Food in the Last Year: Never true    Ran Out of Food in the Last Year: Never true  Transportation Needs: No Transportation Needs (02/08/2022)   PRAPARE - Hydrologist (Medical): No    Lack of Transportation (Non-Medical): No  Physical Activity: Sufficiently Active (02/08/2022)   Exercise Vital Sign    Days of Exercise per Week: 7 days    Minutes of Exercise per Session: 30 min  Stress: No Stress Concern Present (02/08/2022)   [place]    Feeling of Stress : Not at all  Social Connections: Socially Isolated (02/02/2021)   Social  Connection and Isolation Panel [NHANES]    Frequency of Communication with Friends and Family: More than three times a week    Frequency of Social Gatherings with Friends and Family: Once a week    Attends Religious Services: Never    Marine scientist or Organizations: No    Attends Archivist Meetings: Never    Marital Status: Widowed  Intimate Partner Violence: Not At Risk (02/08/2022)   Humiliation, Afraid, Rape, and Kick questionnaire    Fear of Current or Ex-Partner: No    Emotionally Abused: No    Physically Abused: No    Sexually Abused: No    Outpatient Medications Prior to Visit  Medication Sig Dispense Refill   apixaban (ELIQUIS) 5 MG TABS tablet TAKE 1 TABLET BY MOUTH TWICE DAILY 60 tablet 5   atorvastatin (LIPITOR) 40 MG tablet TAKE 1 TABLET (40 MG TOTAL) BY MOUTH DAILY. 90 tablet 1   blood glucose meter kit and supplies by Other route See admin instructions. Check blood sugar 6-7 times daily     Calcium Carbonate-Vitamin D 600-400 MG-UNIT tablet Take 1 tablet by mouth 2 (two) times daily. Reported on 01/27/2016     cholecalciferol (VITAMIN D3) 25 MCG (1000 UNIT) tablet Take 1,000 Units by mouth daily.     Glucagon, rDNA, (GLUCAGON EMERGENCY) 1 MG KIT Inject 1 mg into the muscle once as needed for up to 1 dose. (Patient taking differently: Inject 1 mg into the muscle as needed (blood sugar below 45).) 1 kit 12   insulin aspart (NOVOLOG) 100 UNIT/ML injection Inject 2-3 Units into the skin 3 (three) times daily before meals. 10 mL PRN   insulin degludec (TRESIBA FLEXTOUCH) 100 UNIT/ML FlexTouch Pen Inject 8 Units into the skin daily. Will start in 2024 after he completes current supply of Levemir. 15 mL 0   insulin detemir (LEVEMIR) 100 UNIT/ML injection INJECT 0.8 ML (8 UNITS) INTO THE SKIN AT BEDTIME 30 mL 5   Insulin Syringe-Needle U-100 (TRUEPLUS INSULIN SYRINGE) 31G X 5/16" 0.5 ML MISC USE TO INJECT INSULIN 4 TIMES A DAY 300 each 3   metoprolol succinate  (TOPROL-XL) 25 MG 24 hr tablet TAKE 1 TABLET (25 MG TOTAL) BY MOUTH DAILY. 90 tablet 1   No facility-administered medications prior to visit.    Allergies  Allergen Reactions   Other Other (See Comments)    Client states he became very sick and had significant skin inflammation and irritation with the [facility] sensor   Lisinopril  cough    ROS See HPI    Objective:    Physical Exam Constitutional:      General: He is not in acute distress.    Appearance: Normal appearance. He is not ill-appearing.  HENT:     Head: Normocephalic and atraumatic.     Right Ear: External ear normal.     Left Ear: External ear normal.  Eyes:     Extraocular Movements: Extraocular movements intact.     Pupils: Pupils are equal, round, and reactive to light.  Cardiovascular:     Rate and Rhythm: Normal rate and regular rhythm.     Heart sounds: Normal heart sounds. No murmur heard.    No gallop.  Pulmonary:     Effort: Pulmonary effort is normal. No respiratory distress.     Breath sounds: Normal breath sounds. No wheezing or rales.  Skin:    General: Skin is warm and dry.  Neurological:     Mental Status: He is alert and oriented to person, place, and time.  Psychiatric:        Judgment: Judgment normal.     BP 127/81 Comment: home bp reading this am  Pulse 71   Temp 98 F (36.7 C) (Oral)   Resp 16   Wt 167 lb (75.8 kg)   SpO2 100%   BMI 24.66 kg/m  Wt Readings from Last 3 Encounters:  09/01/22 167 lb (75.8 kg)  07/20/22 162 lb 6.4 oz (73.7 kg)  06/22/22 161 lb 12.8 oz (73.4 kg)       Assessment & Plan:  Controlled type 2 diabetes mellitus with complication, with long-term current use of insulin (HCC) -     Microalbumin / creatinine urine ratio -     Comprehensive metabolic panel  Primary hypertension Assessment & Plan: BP elevated upon arrival today but home bp reading this AM and recently was WNL.  No changes at this time.  BP Readings from Last 3 Encounters:   09/01/22 127/81  07/20/22 110/70  06/22/22 132/80   Pt is maintained on metoprolol.    Pure hypercholesterolemia Assessment & Plan: Lab Results  Component Value Date   CHOL 146 06/02/2022   HDL 47.50 06/02/2022   LDLCALC 79 06/02/2022   TRIG 96.0 06/02/2022   CHOLHDL 3 06/02/2022   LDL at goal, continue lipitor.    IPF (idiopathic pulmonary fibrosis) (HCC) Assessment & Plan: This is a new diagnosis for him and is being managed by pulmonology.    Osteopenia, unspecified location -     DG Bone Density; Future    I, [name], NP, personally preformed the services described in this documentation.  All medical record entries made by the scribe were at my direction and in my presence.  I have reviewed the chart and discharge instructions (if applicable) and agree that the record reflects my personal performance and is accurate and complete. 09/01/2022   I,Ryan [name],acting as a Neurosurgeon for [name], NP.,have documented all relevant documentation on the behalf of [name], NP,as directed by  [name], NP while in the presence of [name], NP.   [name], NP

## 2022-09-01 NOTE — Assessment & Plan Note (Signed)
His last A1C was acceptable for his age at 7.0.  Defer management to endocrinology.

## 2022-09-08 ENCOUNTER — Encounter: Payer: HMO | Admitting: Internal Medicine

## 2022-09-08 DIAGNOSIS — J84112 Idiopathic pulmonary fibrosis: Secondary | ICD-10-CM

## 2022-09-08 DIAGNOSIS — Z006 Encounter for examination for normal comparison and control in clinical research program: Secondary | ICD-10-CM

## 2022-09-08 NOTE — Research (Signed)
Title: A randomized, double-blind-four-arm active and placebo-controlled dose-finding trial to evaluate the efficacy, tolerability, safety and dose response of LYT-100 in patients with idiopathic pulmonary fibrosis (IPF)  Primary Endpoint: Rate of decline in FVC (in mL) over 26 weeks  Protocol # / Study Name: [id number] / [employer] Clinical Trials #: [id number] Sponsor: [employer] Principal Investigator: Dr. Brand Males   Protocol Version for 09/08/2022 date of 04 Oct 2021 Consent Version for 09/08/2022 date of 28-May-2022 Investigator Brochure Version for 09/08/2022 date of 18 Nov 2021   Key Features of Deupirfenidone (study drug): LYT-100 (duepirfenidone) is a selectively deuterated form of pirfenidone. Deuteration is associated with lower GI side effects and improved tolerability.  Key Inclusion Criteria (Part A Double-Blind Study): Male or male, age ?40 years at the time of informed consent Diagnosis of IPF based on ATS/ERS/JRS/ALAT 2018 guidelines: FVC ?45% of predicted normal DLCO ?30% and ?90% of predicted normal Treatment-nave patients or those with <6 months of exposure to nintedanib (<6 month restriction includes periods of dose reduction and interruption)  Key Exclusion Criteria (Part A Double-Blind Study): Current or prior treatment with pirfenidone Exposure to Nintedanib (if treated) last dose taken ?2 weeks before screening date  Use of smoked (burnt) tobacco products or vaping/e-cigarettes   PK: Deuterated pirfenidone has 25% higher Cmax, 35% higher exposure, and similar half-life compared to non-deuterated pirfenidone.  Safety Data: Investigator's Brochure, Version 8.0 In humans, LYT-100 is expected to have an AE profile similar to that of pirfenidone when participants achieve comparable systemic exposure. Therefore, AEs associated with the use of pirfenidone are considered expected with the use of LYT-100.   Clinical Trials LYT-100: Across all  studies, there were no clinically significant findings for laboratory abnormalities, vital signs, ECGs, or physical examinations.  LYT-100 is well-tolerated overall over single-dose and multi-dose studies. Most TEAEs were mild in severity. Compared to placebo and pirfenidone, LYT-100 had similar TEAEs but increased frequency. No deaths related to treatment drug occurred across all studies. SAEs occurred only in post-acute COVID patients (10.5%), one deemed related to treatment drug (severe drug eruption).  The most common reported AEs were headaches and GI-related (nausea, diarrhea, vomiting, abdominal pain, abdominal distension). Less frequency AEs were: dyspepsia, dizziness, fatigue, back pain, and rash.  LYT-100 doses greater than 1250 mg BID did result in early-onset GI events affecting compliance. After a titration regimen in which participants were dosed with 750 mg the first 2 days and the target dose thereafter, tolerability improved with no subsequent treatment discontinuation. TEAEs were similar in frequency and presentation between fasted and fed patients. Rash occurred in 2 patients deemed possibly related to LYT-100 and was transient, intermittent, and resolved by Day 5.    [facility] Coordinator note:   This visit for Subject Ryan [name] with DOB: Dec 13, 1945 on 09/08/2022 for the above protocol is Visit/Encounter # 1  and is for purpose of research.   The consent for this encounter is under Protocol Version 5.0, Investigator Brochure Version 8.0, Consent Version 6.0 and is currently IRB approved.   Subject expressed continued interest and consent in continuing as a study subject. Subject confirmed that there was no   change in contact information (e.g. address, telephone, email). Subject thanked for participation in research and contribution to science. In this visit 09/08/2022 the subject will be evaluated by Principal Investigator named Dr. Chase Caller. This  research coordinator has verified that the above investigator is up to date with his/her training logs.  The Subject was informed that the PI  continues to have oversight of the subject's visits and course through relevant discussions, reviews, and also specifically of this visit by routing of this note to the PI.   This visit is a key visit of screening. The PI is  available for this visit.   Please see subject binder for further details.  Signed by  [place] Coordinator [facility]  [place], [place] 4:45 PM 09/08/2022

## 2022-09-09 ENCOUNTER — Encounter: Payer: Self-pay | Admitting: Family

## 2022-09-09 ENCOUNTER — Ambulatory Visit (HOSPITAL_BASED_OUTPATIENT_CLINIC_OR_DEPARTMENT_OTHER)
Admission: RE | Admit: 2022-09-09 | Discharge: 2022-09-09 | Disposition: A | Discharge status: Home / Self Care | Payer: HMO | Source: Ambulatory Visit | Attending: Family | Admitting: Family

## 2022-09-09 ENCOUNTER — Other Ambulatory Visit (HOSPITAL_BASED_OUTPATIENT_CLINIC_OR_DEPARTMENT_OTHER): Payer: Self-pay

## 2022-09-09 ENCOUNTER — Telehealth: Payer: Self-pay | Admitting: Family

## 2022-09-09 DIAGNOSIS — Z13820 Encounter for screening for osteoporosis: Secondary | ICD-10-CM | POA: Insufficient documentation

## 2022-09-09 DIAGNOSIS — M810 Age-related osteoporosis without current pathological fracture: Secondary | ICD-10-CM | POA: Insufficient documentation

## 2022-09-09 DIAGNOSIS — M8589 Other specified disorders of bone density and structure, multiple sites: Secondary | ICD-10-CM | POA: Insufficient documentation

## 2022-09-09 DIAGNOSIS — M8580 Other specified disorders of bone density and structure, unspecified site: Secondary | ICD-10-CM

## 2022-09-09 DIAGNOSIS — E119 Type 2 diabetes mellitus without complications: Secondary | ICD-10-CM | POA: Diagnosis not present

## 2022-09-09 MED ORDER — ALENDRONATE SODIUM 70 MG PO TABS
70.0000 mg | ORAL_TABLET | ORAL | 4 refills | Status: DC
  Filled 2022-09-09: qty 12, 84d supply, fill #0
  Filled 2022-12-08: qty 12, 84d supply, fill #1
  Filled 2023-03-22: qty 12, 84d supply, fill #2
  Filled 2023-06-30: qty 12, 84d supply, fill #3

## 2022-09-09 NOTE — Telephone Encounter (Signed)
Please advise pt that his bone density shows osteoporosis.  Please start fosamax once weekly in the AM. Sit upright for 90 minutes after taking.

## 2022-09-10 NOTE — Telephone Encounter (Signed)
Patient advised of results and new medication. Specific instructions given to patient

## 2022-09-13 ENCOUNTER — Other Ambulatory Visit: Payer: Self-pay | Admitting: Internal Medicine

## 2022-09-14 ENCOUNTER — Other Ambulatory Visit: Payer: Self-pay | Admitting: Internal Medicine

## 2022-09-14 DIAGNOSIS — Z006 Encounter for examination for normal comparison and control in clinical research program: Secondary | ICD-10-CM

## 2022-09-14 DIAGNOSIS — J84112 Idiopathic pulmonary fibrosis: Secondary | ICD-10-CM

## 2022-09-15 ENCOUNTER — Ambulatory Visit (HOSPITAL_BASED_OUTPATIENT_CLINIC_OR_DEPARTMENT_OTHER): Payer: HMO

## 2022-09-21 ENCOUNTER — Ambulatory Visit (HOSPITAL_BASED_OUTPATIENT_CLINIC_OR_DEPARTMENT_OTHER)
Admission: RE | Admit: 2022-09-21 | Discharge: 2022-09-21 | Disposition: A | Discharge status: Home / Self Care | Payer: Self-pay | Source: Ambulatory Visit | Attending: Internal Medicine | Admitting: Internal Medicine

## 2022-09-21 ENCOUNTER — Encounter (HOSPITAL_BASED_OUTPATIENT_CLINIC_OR_DEPARTMENT_OTHER): Payer: Self-pay

## 2022-09-21 DIAGNOSIS — J84112 Idiopathic pulmonary fibrosis: Secondary | ICD-10-CM

## 2022-09-21 DIAGNOSIS — J984 Other disorders of lung: Secondary | ICD-10-CM | POA: Diagnosis not present

## 2022-10-01 ENCOUNTER — Encounter: Payer: Self-pay | Admitting: Internal Medicine

## 2022-10-01 ENCOUNTER — Ambulatory Visit (INDEPENDENT_AMBULATORY_CARE_PROVIDER_SITE_OTHER): Payer: PPO | Admitting: Internal Medicine

## 2022-10-01 VITALS — BP 130/88 | HR 68 | Ht 69.0 in | Wt 169.8 lb

## 2022-10-01 DIAGNOSIS — E663 Overweight: Secondary | ICD-10-CM | POA: Diagnosis not present

## 2022-10-01 DIAGNOSIS — E785 Hyperlipidemia, unspecified: Secondary | ICD-10-CM | POA: Diagnosis not present

## 2022-10-01 DIAGNOSIS — E1159 Type 2 diabetes mellitus with other circulatory complications: Secondary | ICD-10-CM

## 2022-10-01 DIAGNOSIS — E1165 Type 2 diabetes mellitus with hyperglycemia: Secondary | ICD-10-CM | POA: Diagnosis not present

## 2022-10-01 LAB — POCT GLYCOSYLATED HEMOGLOBIN (HGB A1C): Hemoglobin A1C: 7.3 % — AB (ref 4.0–5.6)
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
## 2022-10-01 NOTE — Patient Instructions (Signed)
Please continue: - Levemir 8 units at bedtime  Decrease: - Novolog mealtime 2-5 units only before large meals  Please return in 4-6 months with your sugar log.

## 2022-10-04 ENCOUNTER — Encounter: Payer: HMO | Admitting: Internal Medicine

## 2022-10-04 ENCOUNTER — Telehealth: Payer: Self-pay | Admitting: Internal Medicine

## 2022-10-04 NOTE — Telephone Encounter (Signed)
He is not going to screen pass for a study 10/05/22 . Please give him first available standard of care visit with me. No need for PFT.

## 2022-10-05 ENCOUNTER — Other Ambulatory Visit (HOSPITAL_BASED_OUTPATIENT_CLINIC_OR_DEPARTMENT_OTHER): Payer: Self-pay

## 2022-10-05 ENCOUNTER — Encounter: Payer: HMO | Admitting: Internal Medicine

## 2022-10-27 ENCOUNTER — Telehealth: Payer: Self-pay

## 2022-10-27 NOTE — Telephone Encounter (Signed)
Called pt was advised his needles and  Novolog  Flex Pen was here for pickup.Pt stated  Will come by to get them.

## 2022-10-29 ENCOUNTER — Ambulatory Visit: Payer: PPO | Admitting: Internal Medicine

## 2022-10-29 ENCOUNTER — Telehealth: Payer: Self-pay | Admitting: Pharmacist

## 2022-10-29 ENCOUNTER — Telehealth: Payer: Self-pay | Admitting: Internal Medicine

## 2022-10-29 ENCOUNTER — Encounter: Payer: Self-pay | Admitting: Internal Medicine

## 2022-10-29 VITALS — BP 140/80 | HR 62 | Ht 69.0 in | Wt 169.8 lb

## 2022-10-29 DIAGNOSIS — J84112 Idiopathic pulmonary fibrosis: Secondary | ICD-10-CM

## 2022-10-29 NOTE — Patient Instructions (Addendum)
ICD-10-CM   1. IPF (idiopathic pulmonary fibrosis) (HCC)  J84.112       Subjectively feeling well and also walking desaturation to stable but CT scan of the chest February 2024 slightly worse compared to spring 2023.  Plan - I have requested pharmacy team to look at what your out-of-pocket cost for nintedanib or pirfenidone the approved standard of care antifibrotic drugs would be.  Appreciate your concern about expense of these drugs and wanting to avoid it but I still think we should do a cost analysis  -We also discussed experimental research protocol REVERET stud appreciate your interest.  Study team will be in touch with you.y -   Followu' -4 printing months to standard of care pulmonary function testing spirometry and DLCO and return to see Dr. Chase Caller for 30-minute visit  -Can cancel this if you are in a research protocol

## 2022-10-29 NOTE — Telephone Encounter (Addendum)
Devki  [name]  is very concerned about donut hole issues because he is on SSI and 4 family members all disabled. Plu son elqius and insulin. Are you able ot figure out  how much generic pirffenione of esbriet or Ofev will cost him?  He is also looking at a research study drug  Thanks    SIGNATURE    Dr. Brand Males, M.D., F.C.C.P,  Pulmonary and Critical Care Medicine Staff Physician, [place] Director - Interstitial Lung Disease  Program  Medical Director - [facility] [place] [facility] [place], [place], [address]   Pager: [phone], If no answer  -[place] or [place] [phone] Telephone (clinical office): [phone] Telephone (research): [phone]  12:13 PM 10/29/2022

## 2022-10-29 NOTE — Telephone Encounter (Signed)
Submitted a Prior Authorization request to  [employer]  for ESBRIET via CoverMyMeds. Will update once we receive a response.  Key: [id number]  If brand name Esbriet denied for requiring trial of generic, will need to run PA for generic for cost estimate. If generic pirfenidone too expensive, we need to start BIV for Ofev. Patient's major barrier to initiating treatment is cost of meds.  Knox Saliva, PharmD, MPH, BCPS, CPP Clinical Pharmacist (Rheumatology and Pulmonology)

## 2022-10-29 NOTE — Progress Notes (Unsigned)
OV 12/21/2021  Subjective:  Patient ID: Ryan [name], male , DOB: [birth year] , age 77 y.o. , MRN: [id number] , ADDRESS: [address] [place] [address] PCP [name], NP Patient Care Team: [name], NP as PCP - General (Internal Medicine) Fay Records, MD as PCP - Cardiology (Cardiology) [name], MD as Consulting Physician (Endocrinology) Fay Records, MD as Consulting Physician (Cardiology) Cherre Robins, [place] (Pharmacist) Luretha Rued, RN as Case Manager  This Provider for this visit: Treatment Team:  Attending Provider: Brand Males, MD    12/21/2021 -   Chief Complaint  Patient presents with   Consult    Pt had recent CT performed which is the reason for today's visit.  Pt denies any current complaints of cough, SOB, chest discomfort.     HPI Ryan [name] 77 y.o. -referred because of abnormal CT scan of the chest concern for interstitial lung disease.  He is a widower.  He lives alone in [place] area.  He used to work at [employer] but then retired after a right shoulder injury.  He says he quit cigarettes in 1967 and quit alcohol in 1969.  However he has been diagnosed with cirrhosis of the liver.  He sees Carl Best at [facility].  This was diagnosed/7/23 during incidental finding of a CT chest when his liver showed nodular appearance and current signs of cirrhosis.  March 2023 had normal liver function test and platelet count.  2001 colonoscopy was normal.  Mid-to-late March 2023 got admitted for acute kidney injury acidosis and poorly controlled diabetes.  Other than that he has type 2 diabetes, atrial flutter   He has history of CHF not otherwise specified.  Last echo September 2021.   Creatinine 0.9 mg percent in May 2023.  Bilirubin 1.6 mg percent Dec 07, 2021 but normal in March.  Normal hemoglobin.  PFT      View : No data to display.          Latest Reference Range & Units 12/07/21 14:22   A-1 Antitrypsin, Ser 83 - 199 mg/dL 142  Anti Nuclear Antibody (ANA) NEGATIVE  NEGATIVE  Mitochondrial M2 Ab, IgG <=20.0 U <=20.0  IgG (Immunoglobin G), Serum 600 - 1,540 mg/dL 1,149  AFP Tumor Marker <6.1 ng/mL 2.6        OV 06/22/2022  Subjective:  Patient ID: Ryan [name], male , DOB: 09/09/1945 , age 77 y.o. , MRN: [id number] , ADDRESS: [address] [place] [address] PCP [name], NP Patient Care Team: [name], NP as PCP - General (Internal Medicine) Fay Records, MD as PCP - Cardiology (Cardiology) [name], MD as Consulting Physician (Endocrinology) Fay Records, MD as Consulting Physician (Cardiology) Cherre Robins, [place] (Pharmacist)  This Provider for this visit: Treatment Team:  Attending Provider: Brand Males, MD    06/22/2022 -   Chief Complaint  Patient presents with   Follow-up    PFT performed today.  Pt states he has been doing okay since last visit and denies any complaints.     HPI Ryan [name] 77 y.o. -returns for follow-up.  He says he did the ILD questionnaire and dropped it off in September 2023.  However I do not have this with me today.  We went over the exposures.  He denies any agent orange exposure.  He did smoke in the past.  He is not aware of any family history of pulmonary fibrosis denies  any mold or mildew exposure.  Denies any further so far.  He quit smoking many years ago.  Currently is asymptomatic.  His autoimmune serology was reviewed and this is negative.  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       Latest Reference Range & Units 12/21/21 15:03   Sed Rate 0 - 20 mm/hr 33 (H)  Cyclic Citrullin Peptide Ab UNITS <16  ds DNA Ab IU/mL <1  RA Latex Turbid. <14 IU/mL <14  SSA (Ro) (ENA) Antibody, IgG <1.0 NEG AI <1.0 NEG  SSB (La) (ENA) Antibody, IgG <1.0 NEG AI <1.0 NEG  Scleroderma (Scl-70) (ENA) Antibody, IgG <1.0 NEG AI <1.0 NEG  A.Fumigatus #1 Abs Negative  Negative  Micropolyspora faeni, IgG Negative  Negative  Thermoactinomyces vulgaris, IgG Negative  Negative  A. Pullulans Abs Negative  Negative  Thermoact. Saccharii Negative  Negative  Pigeon Serum Abs Negative  Negative  (H): Data is abnormally high    07/20/2022 Follow up : IPF  Patient returns for 1 month follow-up.  Patient has underlying IPF.  Patient was seen last visit with PFTs showing no airflow obstruction or restriction and decreased diffusing capacity at 70%.  Previous CT chest April 2023 showed patchy areas of groundglass attenuation and septal thickening bilaterally in the lungs.  Mild bronchiectasis.  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 Previous autoimmune/connective tissue workup was unrevealing.  Patient says that he is somewhat active around the house.  Does get short of breath heavy activities but has no dyspnea at rest denies any significant cough.  Patient does have severe diabetes and does not drive.  His son and son's family lives with him at home.  They help with his transportation issues.    Last visit treatment options including standard of care with antifibrotic such as Ofev and Esbriet were discussed.  Also discussed research trials such as [employer] LYT-100 .  Patient says he thought over treatment options and would like to see if he is a candidate for the research trial.  We went over again how research trials work.  Patient verbalizes understanding.    OV 10/29/2022  Subjective:  Patient ID: Ryan [name], male , DOB: 11/01/1945 , age 77 y.o. , MRN: [id number] , ADDRESS: [address] [place] [address] PCP [name], NP Patient Care  Team: [name], NP as PCP - General (Internal Medicine) Fay Records, MD as PCP - Cardiology (Cardiology) [name], MD as Consulting Physician (Endocrinology) Fay Records, MD as Consulting Physician (Cardiology) Cherre Robins, RPH-CPP (Pharmacist)  This Provider for this visit: Treatment Team:  Attending Provider: Brand Males, MD    10/29/2022 -  IPF followup -on supportive care  HPI Ryan [name] 77 y.o. -returns for follow-up.  He was not interested in standard of care therapy for IPF.  He opted to going to a trial that involved modified pirfenidone versus placebo.  However he screen failed.  He is currently states he is feeling asymptomatic.  And is stable.  Walking desaturation test also stable.  In February 2024 he did have a high-resolution CT chest that showed mild progression in his pulmonary fibrosis.  I again had a conversation with him with the approved antifibrotic drugs.  It appears his biggest issue is cost.  He is already paying $145 a month for Eliquis and up to $400 a month for insulin.  He says he has 4 people living in his house including his son, daughter and 2 grandchildren.  He says all of them are disabled.  He categorically denies any of them doing drugs or smoking cigarettes.  He does not smoke cigarettes either.  I did indicate to him about the new Medicare rules around donut hole.  I told him that up to 3000-3500$ would be his out-of-pocket and we could try to get charity.  This is a new CMS rules.  Based on this he does not want to consider the approved antifibrotic therapy.  However he finally agreed for our pharmacy team to do a cost analysis for him.  He does have consent form for the study called revert -by [facility] therapeutics.  He is definitely interested in this.  Did indicate to him the GI side effects and risk of diabetes variance with asked extremital protocols and other unknown issues.  He is willing to take the risk.  He understands  his experimental protocol.  He feels his diabetes is better controlled.  We went over the following aspects of clinical research    1. Scientific Purpose  Clinical research is designed to produce generalizable knowledge and to answer questions about the safety and efficacy of intervention(s) under study in order to determine whether or not they may be useful for the care of future patients.  2. Study Procedures  Participation in a trial may involve procedures or tests, in addition to the intervention(s) under study, that are intended only or primarily to generate scientific knowledge and that are otherwise not necessary for patient care.   3. Uncertainty  For intervention(s) under study in clinical research, there often is less knowledge and more uncertainty about the risks and benefits to a population of trial participants than there is when a doctor offers a patient standard interventions.   4. Adherence to Protocol  Administration of the intervention(s) under study is typically based on a strict protocol with defined dose, scheduling, and use or avoidance of concurrent medications, compared to administration of standard interventions.  5. Clinician as Investigator  Clinicians who are in health care settings provide treatment; in a clinical trial setting, they are also investigating safety and efficacy of an intervention. In otherwise your doctor or nurse practitioner can be wearing 2 hats - one as care giver another as Company secretary  6. Patient as Visual merchandiser Subject  Patients participating in research trials are research subjects or volunteers. In other words participating in research is 100% voluntary and at one's own free weill. The decision to participate or not participate will NOT affect patient care and the doctor-patient relationship in any way   SYMPTOM SCALE - ILD 06/22/2022 10/29/2022   Current weight    O2 use ra ra  Shortness of Breath 0 -> 5 scale  with 5 being worst (score 6 If unable to do)   At rest 0 0  Simple tasks - showers, clothes change, eating, shaving 00 0  Household (dishes, doing bed, laundry) 0 0  Shopping 0 0  Walking level at own pace 0 0  Walking up Stairs 0 2  Total (30-36) Dyspnea Score 0 2  How bad is your cough? 0 0  How bad is your fatigue 0  0  How bad is nausea 0 0  How bad is vomiting?  00 0  How bad is diarrhea? 0 0  How bad is anxiety? 0 0  How bad is depression 0 0  Any chronic pain - if so where and how bad 0 0      Simple office walk 185 feet x  3 laps goal with forehead probe 06/22/2022  10/29/2022   O2 used ra ra  Number laps completed 3   Comments about pace avg   Resting Pulse Ox/HR 100% and 73/min 99% and HR 53  Final Pulse Ox/HR 100% and 89/min 99% and HR 83  Desaturated </= 88% no   Desaturated <= 3% points no   Got Tachycardic >/= 90/min no   Symptoms at end of test none   Miscellaneous comments x     PFT     Latest Ref Rng & Units 06/22/2022    8:33 AM  PFT Results  FVC-Pre L 3.47   FVC-Predicted Pre % 89   FVC-Post L 3.51   FVC-Predicted Post % 90   Pre FEV1/FVC % % 85   Post FEV1/FCV % % 87   FEV1-Pre L 2.95   FEV1-Predicted Pre % 106   FEV1-Post L 3.06   DLCO uncorrected ml/min/mmHg 16.65   DLCO UNC% % 70   DLCO corrected ml/min/mmHg 16.65   DLCO COR %Predicted % 70   DLVA Predicted % 87   TLC L 5.18   TLC % Predicted % 77   RV % Predicted % 65        has a past medical history of Allergy, Arthritis, Diabetes mellitus, GERD (gastroesophageal reflux disease), History of chicken pox, Hyperlipidemia, Hypertension, IPF (idiopathic pulmonary fibrosis) ([place]), Myocardial infarction ([place]) (2017), Osteoporosis, and Type 2 diabetes mellitus with hyperglycemia, with long-term current use of insulin ([place]) (09/08/2015).   reports that he quit smoking about 57 years ago. His smoking use included cigarettes. He has never used smokeless tobacco.  Past Surgical History:   Procedure Laterality Date   EYE SURGERY  1998   bilateral   SHOULDER SURGERY  1990   right shoulder, torn rotator cuff.    Allergies  Allergen Reactions   Other Other (See Comments)    Client states he became very sick and had significant skin inflammation and irritation with the Freestyle Libre sensor   Lisinopril     cough    Immunization History  Administered Date(s) Administered   Fluad Quad(high Dose 65+) 05/15/2019, 04/25/2020, 06/24/2021, 05/28/2022   H1N1 11/19/2008   Hepb-cpg 12/29/2021, 03/02/2022   Influenza Split 05/25/2011, 04/20/2012   Influenza Whole 06/06/2009   Influenza, High Dose Seasonal PF 04/20/2016, 05/12/2017, 05/26/2018   Influenza,inj,Quad PF,6+ Mos 05/07/2013, 05/27/2014, 05/13/2015   Influenza-Unspecified 05/09/2004, 06/09/2005, 06/15/2006, 07/11/2007, 05/15/2008, 06/06/2009   PFIZER(Purple Top)SARS-COV-2 Vaccination 09/30/2019, 10/24/2019, 07/22/2020   Pneumococcal Conjugate-13 10/19/2004, 05/11/2013   Pneumococcal Polysaccharide-23 05/25/2011, 09/08/2018   Td 08/09/2001, 08/25/2011   Zoster Recombinat (Shingrix) 01/22/2021, 05/20/2021   Zoster, Live 08/25/2011    Family History  Problem Relation Age of Onset   Cancer Mother        cancer   Heart disease Mother    Hyperlipidemia Mother    Hypertension Mother    Hyperlipidemia Father    Stroke Father    Hypertension Father    Diabetes Paternal Grandfather    Colon cancer Neg Hx    Stomach cancer Neg Hx    Esophageal cancer Neg Hx  Current Outpatient Medications:    alendronate (FOSAMAX) 70 MG tablet, Take 1 tablet (70 mg total) by mouth every 7 (seven) days. Take with a full glass of water on an empty stomach., Disp: 12 tablet, Rfl: 4   apixaban (ELIQUIS) 5 MG TABS tablet, TAKE 1 TABLET BY MOUTH TWICE DAILY, Disp: 60 tablet, Rfl: 5   atorvastatin (LIPITOR) 40 MG tablet, TAKE 1 TABLET (40 MG TOTAL) BY MOUTH DAILY., Disp: 90 tablet, Rfl: 1   blood glucose meter kit and supplies, by  Other route See admin instructions. Check blood sugar 6-7 times daily, Disp: , Rfl:    Calcium Carbonate-Vitamin D 600-400 MG-UNIT tablet, Take 1 tablet by mouth 2 (two) times daily. Reported on 01/27/2016, Disp: , Rfl:    cholecalciferol (VITAMIN D3) 25 MCG (1000 UNIT) tablet, Take 1,000 Units by mouth daily., Disp: , Rfl:    Glucagon, rDNA, (GLUCAGON EMERGENCY) 1 MG KIT, Inject 1 mg into the muscle once as needed for up to 1 dose. (Patient taking differently: Inject 1 mg into the muscle as needed (blood sugar below 45).), Disp: 1 kit, Rfl: 12   insulin aspart (NOVOLOG) 100 UNIT/ML injection, Inject 2-3 Units into the skin 3 (three) times daily before meals., Disp: 10 mL, Rfl: PRN   insulin degludec (TRESIBA FLEXTOUCH) 100 UNIT/ML FlexTouch Pen, Inject 8 Units into the skin daily. Will start in 2024 after he completes current supply of Levemir., Disp: 15 mL, Rfl: 0   insulin detemir (LEVEMIR) 100 UNIT/ML injection, INJECT 0.8 ML (8 UNITS) INTO THE SKIN AT BEDTIME, Disp: 30 mL, Rfl: 5   Insulin Syringe-Needle U-100 (TRUEPLUS INSULIN SYRINGE) 31G X 5/16" 0.5 ML MISC, USE TO INJECT INSULIN 4 TIMES A DAY, Disp: 300 each, Rfl: 3   metoprolol succinate (TOPROL-XL) 25 MG 24 hr tablet, TAKE 1 TABLET (25 MG TOTAL) BY MOUTH DAILY., Disp: 90 tablet, Rfl: 1      Objective:   Vitals:   10/29/22 1140  BP: (!) 140/80  Pulse: 62  SpO2: 98%  Weight: 169 lb 12.8 oz (77 kg)  Height: 5\' 9"  (1.753 m)    Estimated body mass index is 25.08 kg/m as calculated from the following:   Height as of this encounter: 5\' 9"  (1.753 m).   Weight as of this encounter: 169 lb 12.8 oz (77 kg).  @WEIGHTCHANGE @  [employer]   10/29/22 1140  Weight: 169 lb 12.8 oz (77 kg)     Physical Exam    General: No distress. Looks well Neuro: Alert and Oriented x 3. GCS 15. Speech normal Psych: Pleasant Resp:  Barrel Chest - no.  Wheeze - no, Crackles - YES, No overt respiratory distress CVS: Normal heart sounds. Murmurs -  no Ext: Stigmata of Connective Tissue Disease - no HEENT: Normal upper airway. PEERL +. No post nasal drip        Assessment:       ICD-10-CM   1. IPF (idiopathic pulmonary fibrosis) (HCC)  [id number] Pulmonary function test         Plan:     Patient Instructions     ICD-10-CM   1. IPF (idiopathic pulmonary fibrosis) (HCC)  J84.112       Subjectively feeling well and also walking desaturation to stable but CT scan of the chest February 2024 slightly worse compared to spring 2023.  Plan - I have requested pharmacy team to look at what your out-of-pocket cost for nintedanib or pirfenidone the approved standard of care antifibrotic drugs would  be.  Appreciate your concern about expense of these drugs and wanting to avoid it but I still think we should do a cost analysis  -We also discussed experimental research protocol REVERET stud appreciate your interest.  Study team will be in touch with you.y -   Followu' -4 months to standard of care pulmonary function testing spirometry and DLCO and return to see Dr. Chase Caller for 30-minute visit  -Can cancel this if you are in a research protocol    SIGNATURE    Dr. Brand Males, M.D., F.C.C.P,  Pulmonary and Critical Care Medicine Staff Physician, Reserve Director - Interstitial Lung Disease  Program  [facility] [place], [place], [address]  Pager: [phone], If no answer or between  15:00h - 7:00h: call [phone] Telephone: [phone]  12:19 PM 10/29/2022

## 2022-10-29 NOTE — Telephone Encounter (Addendum)
We can run benefits investigation for both Esbriet and Ofev. If Esbriet (brand name) is denied b/c he has to try pirfenidone, then he will not be approved for PAP. Ofev would have PAP in place either way as long as he meets household income criteria  Wil f/u with patient along hte way.  Knox Saliva, PharmD, MPH, BCPS, CPP Clinical Pharmacist (Rheumatology and Pulmonology)

## 2022-11-04 ENCOUNTER — Encounter: Payer: PPO | Admitting: Internal Medicine

## 2022-11-04 DIAGNOSIS — J84112 Idiopathic pulmonary fibrosis: Secondary | ICD-10-CM

## 2022-11-04 NOTE — Research (Addendum)
Title: A Phase 2 Multicenter, Randomized, Double-blind, Placebo Controlled Study to Evaluate the Safety, Tolerability, Pharmacokinetics, and Efficacy of TTI-101 in Participants with Idiopathic Pulmonary Fibrosis    Dose and Duration of Treatment: 100 participants will be enrolled using a 1:1:1:1 randomization ratio over a 20 week period (4 week screening period, 12 week treatment period and a 4 week follow-up period).   25 participants: TTI-101 400 mg/day   25 participants: TTI-101 800 mg/day   25 participants: TTI-101 1200 mg/day   25 participants: placebo   Protocol # [id number]    Sponsor: [employer], [place], [place] [address] [place]  Protocol Version/Amendment: 3.0 date  10 Nov2023,  for 11/04/2022 date of       Consent Version: ver.[email or web address] [id number]  for 11/04/2022      Investigator Brochure Product: TTI-101        Mechanism of action: TTI-101 is a small-molecule inhibitor of STAT3 (signal transducer and activator of transcription 3). STAT3 plays a major role in the cellular processes involved in fibrosis including fibroblast proliferation. TTI-101 binds tightly to STAT3 which prevents STAT3 recruitment to signaling complexes that contain activated tyrosine kinases, thereby preventing STAT3 phosphorylation on Y705 and STAT3 activation. Binding of TTI-101 to STAT3 also prevents STAT3 dimerization. As such, TTI-101 is expected to prevent or reverse progression of fibrosis in IPF patients.  Key Inclusion Criteria: Age ? 34 years  Diagnosed with IPF based on the 2018 or 2022 ATS/ERS/JRS/ALAT guidelines within 5 years  Chest HRCT performed within 12 months meeting requirements for IPF Extent of fibrosis > emphysematous changes on the HRCT Meeting all the following during screening confirmed by central review,   >45% of predicted FVC and a (FEV1)/FVC ?0.7  A predicted DLCO (Hb corrected) >30%  SpO2 ? 88% with up to 2L O2/min by pulse  oximetry at rest  If currently receiving nintedanib, stable dose for >3 months  Key Exclusion Criteria: Known to have the following diseases at screening: Uncontrolled pulmonary hypertension, or pulmonary hypertension requiring chronic medical treatment Congestive heart failure - NYHA Class III or IV   Drug-induced interstitial pneumonia, pneumoconiosis, hypersensitivity pneumonitis, or radiation pneumonitis   Collagen vascular disease, autoimmune disease, sarcoidosis, granulomatous disease Severe hypertension (BP ? 160/100 despite maximal therapy within 3 months of visit 1) Myocardial infarction, unstable cardiac angina, or history of thrombotic event within 6 months of screening QTcF >450 msec for men and >470 msec for women at screening.   History of significant/symptomatic bradycardia long QT syndrome or  Uncorrected hypomagnesemia or hypokalemia or Heart rate <50 bpm at rest   Unresolved respiratory tract infection within 4 weeks or an acute exacerbation of IPF within 3 months  Active cancer or a history of cancer with a significant risk of recurrence during the study.  eGFR ?84mL/min/1.73 m2, Hb?10g/dL, WBC<3000/mm3 , platelets <100,000/mm3 , serum total bilirubin >1.5 x ULN, liver enzymes ? 2 x ULN Receiving steroids > 10 mg/day of prednisolone or its equivalent within 2 weeks   Immunosuppressive agents within 4 weeks  Received pirfenidone within 3 months, smoking within 3 months   Pharmacokinetics: (ADME) - Contraindications: The SEDD formulation for TTI-101 provided greater systemic exposures relative to the Labrasol/PEG formulation. The SEDD formulation decreased the pill burden while increasing systemic exposures. The half-life of the drug in participants generally ranged from 4-8 h. The half-life data are supportive of the BID dose schedule. The administration of TTI-101 is limited to investigational use only and limited  to the oral route of administration. TTI-101 is  contraindicated for use in participants with known hypersensitivity to any of the excipients. Special Warnings/Considerations: Insufficient experience exists with TTI-101 to provide comprehensive warning guidance beyond what is standard for any investigational drug.  Interactions:  Studies indicated the potential for strong induction of CYP1A2 by TTI-101. As expected, TTI-101 did induce metabolism of pirfenidone, known to be primarily metabolized by CYP1A2. There was no interaction shown between TTI-101 and nintedanib. Drug Interaction Studies: [facility] conducted a DDI study in normal healthy volunteers given TTI-101 and either pirfenidone or nintedanib. Serious Adverse Reactions Observed in Clinical studies: 32 participants out of 105 reported SAE. (30.5%). However all SAEs reported were from study 2016-0842 (participants with solid tumors). No SAEs were reported in the DDI study (Healthy volunteer). Majority of SAE were GI disorders.  Serious Adverse Reactions Observed Postmarketing: To date, TTI-101 has not been registered for use or marketed in any jurisdiction. Safety Data:  The safety data from study 2016-0842 demonstrates the most frequent AEs are GI-related ( nausea, vomiting, change in appetite, diarrhea, abdominal pain) and related to liver function (elevated AST and ALT).  Fewer AEs were reported with the SDD formulation. EKG - PR prolongation was seen in 1 out of 105 participants. (1%)    Overall Adverse events in total participants N=105 (%) Severity  Grade1-mild Grade2-Moderate Grade3-Severe Formulation 1  N=15 Formulation 2    N=47 Formulation 3 (SDD)  N=48*  Diarrhea 30 (28.6%) 24 (22.9%) 7 (6.7%) 8 (7.6%) 6 (40.0%) 21 (44.7%) 3 (6.3%)  Nausea 14 (13.3%) 9 (8.6%) 4 (3.8%) 1 (1.0%) 4 (26.7%) 7 (14.9%) 3 (6.3%)  Vomiting 7 (6.7%) 5 (4.8%) 2 (1.9%) - 3 (20.0%) 2 (4.3%) 2 (4.2%)   Abdominal pain 3 (2.9%) 2 (1.9%) 1 (1.0%) - 3 (20.0%) - -  Elevated ALT 7 (6.7%)   4  (3.8%)   3 (2.9%)   3 (2.9%) 2 (13.3%)  4 (8.5%)   1 (2.1%)   Elevated AST 6 (5.7%)   2 (1.9%)   4 (3.8%)   3 (2.9%) 1 (6.7%)  4 (8.5%)  1 (2.1%)   Headache 5 (4.8%) 5 (4.8%) - - - - 5 (10.4%)    TEAE from DDI study alone with Healthy Volunteer N=41 Part 1 nintedanib Part 2 Pirfenidone  Diarrhea  2 (4.9%)     1 (4.8%)  1 (5.0%)  Nausea 2 (4.9%)   1 (4.8%)   1 (5.0%)   Vomiting 1 (2.4%)     -  1 (5.0%)   Abdominal distension 1 (2.4%)     1 (4.8%)  -  Headache 5 (12.2%)   4 (19.0%)  1 (5.0%)    Stability:  The recommended storage condition for TTI-101 tablet, 200 mg and matching placebo is room temperature (20C to 25C or 44F to 7F). Excursions between 15C and 30C (37F and 24F) are allowed.        [facility] [place] Coordinator note:   This visit for Subject Ryan [name] with DOB: [birth year] on 11/04/2022 for the above protocol is Screening visit  and is for purpose of research.   The consent for this encounter is under Protocol Version 3.0,, Consent Version 10 Aug 2022 and  is currently IRB approved.   Subject expressed continued interest and consent in continuing as a study subject. Subject confirmed that there was no change in contact information (e.g. address, telephone, email). Subject thanked for participation in research and contribution to science.  In this visit 11/04/2022 the subject will be evaluated by Principal Investigator named Dr. Chase Caller. This research coordinator has verified that the above investigator is up to date with his/her training logs.   The Subject was informed that the PI  continues to have oversight of the subject's visits and course through relevant discussions, reviews, and also specifically of this visit by routing of this note to the PI.    1. This visit is a key visit of screening. The PI is available for this visit.     Signed by  Evelina Dun MD  [place] Coordinator  [facility]  [place],  [place] 4:40 PM 11/04/2022

## 2022-11-08 NOTE — Telephone Encounter (Signed)
Received notification from [place] regarding approval for TRESIBA FLEXTOUCH and NOVOLOG FLEXPEN  and Novofine pen needles.  Patient assistance approved from 10/09/2022 to 10/09/2023.  Please be advised  Phone: [phone]

## 2022-11-09 ENCOUNTER — Telehealth: Payer: Self-pay

## 2022-11-09 ENCOUNTER — Other Ambulatory Visit (HOSPITAL_COMMUNITY): Payer: Self-pay

## 2022-11-09 NOTE — Telephone Encounter (Signed)
I received a call from Anette Riedel that she spoke with a representative from [employer] and they believe that insulin shipment our office received 10/27/2022 was Fiasp insulin and not [place] or Novolog insulin.  I had Shamaine Cousar, CMA check the clinic refrigerator but patient has already picked up insulin shipment.  I tried to outreach patient to verify what insulin he received but I was unable to reach patient. LM on VM with CB # [phone] or [phone] for patient to call me.

## 2022-11-09 NOTE — Telephone Encounter (Signed)
Patient called back. He endorses that he did received [place] instead of [place]. He had some Levemir left and can continue to use this until [place] arrives.  Patient will bring Fiasp back. Novo Nordisk will send packaging and label for [place] to sent Fiasp back to them.

## 2022-11-09 NOTE — Telephone Encounter (Signed)
After receiving notification that pt was approved for [facility]. From [place].   I spoke to [employer] this morning and I was informed that the wrong medicine was sent in their error. Please note this error WAS on their part and they will be fixing it.  Was suppose to be [place] flex touch and Novolog flex pen but in the place of TreSIBA they sent FIASP. They will be sending a return label FOR THE FIASP to the office and sending out CORRECT MEDS (treSIBA) out in 10 to 14 days  PLEASE NOTE AND BE ADVISED. [facility] Patient Advocate [phone]

## 2022-11-10 ENCOUNTER — Other Ambulatory Visit: Payer: Self-pay | Admitting: Internal Medicine

## 2022-11-10 ENCOUNTER — Other Ambulatory Visit (HOSPITAL_BASED_OUTPATIENT_CLINIC_OR_DEPARTMENT_OTHER): Payer: Self-pay

## 2022-11-10 DIAGNOSIS — I4892 Unspecified atrial flutter: Secondary | ICD-10-CM

## 2022-11-10 MED ORDER — APIXABAN 5 MG PO TABS
5.0000 mg | ORAL_TABLET | Freq: Two times a day (BID) | ORAL | 5 refills | Status: DC
  Filled 2022-11-10: qty 60, 30d supply, fill #0
  Filled 2022-12-08: qty 60, 30d supply, fill #1
  Filled 2023-01-17: qty 60, 30d supply, fill #2
  Filled 2023-02-21: qty 60, 30d supply, fill #3
  Filled 2023-03-25: qty 60, 30d supply, fill #4
  Filled 2023-05-02: qty 60, 30d supply, fill #5

## 2022-11-10 NOTE — Telephone Encounter (Signed)
Pt last saw Dr [name] 10/07/21, pt is overdue for follow-up.  Last note states pt was due to follow-up in fall of 2023. Msg sent to schedulers to contact pt for follow-up appt.  Last labs 09/01/22 Creat 0.90, age 77, weight 77kg, based on specified criteria pt is on appropriate dosage of Eliquis 5mg  BID for a-flutter.  Will await appt to refill rx to get pt to follow-up visit.

## 2022-11-10 NOTE — Telephone Encounter (Signed)
Pt scheduled appt with Dr [name] on 03/17/23.   Appropriate dose. Refill sent.

## 2022-11-15 NOTE — Telephone Encounter (Signed)
Clinical questions for Esbriet never populated. Form completed and faxed w clinicals  Fax: [phone] Phone: [phone]  Chesley Mires, PharmD, MPH, BCPS, CPP Clinical Pharmacist (Rheumatology and Pulmonology)

## 2022-11-18 NOTE — Telephone Encounter (Signed)
Received a fax regarding Prior Authorization from  [employer]  for ESBRIET. Authorization has been DENIED because patient must try and fail or be unable to try formualry alternatives: Ofev, pirfenidone capsule, pirfenidone tablets.  Submitted a Prior Authorization request to  [employer] for [employer]  for PIRFENIDONE via CoverMyMeds. Will update once we receive a response.  Key: [id number]

## 2022-11-22 ENCOUNTER — Encounter: Payer: Self-pay | Admitting: *Deleted

## 2022-11-22 NOTE — Telephone Encounter (Signed)
Clinical questions for PIRFENIDONE PA did not populate. HTA form completed and faxed to HTA with clinicals  Fax: [phone] Phone: [phone]  Chesley Mires, PharmD, MPH, BCPS, CPP Clinical Pharmacist (Rheumatology and Pulmonology)

## 2022-11-24 ENCOUNTER — Other Ambulatory Visit (HOSPITAL_COMMUNITY): Payer: Self-pay

## 2022-11-24 NOTE — Telephone Encounter (Signed)
BIV for Ofev:  Submitted a Prior Authorization request to  [employer]  for OFEV via fax. Will update once we receive a response.  Fax: [phone] Phone: [phone]  Chesley Mires, PharmD, MPH, BCPS, CPP Clinical Pharmacist (Rheumatology and Pulmonology)

## 2022-11-24 NOTE — Telephone Encounter (Signed)
Received notification from  [employer]A  regarding a prior authorization for PIRFENIDONE. Authorization has been APPROVED from 11/22/22 to 11/22/23. Approval letter sent to scan center.  Per test claim, copay for 30 days supply is $3322.43 for generic pirfenidone  Patient can fill through [facility]: [phone]   Authorization # [id number] Phone # [phone]  One option is also to fill w [facility]. This bypasses insurance all together. The copay for a 30 day supply will be a little less than $250 but this would be month-to-month with fluctuations to cost to be unlikely/unexpected.  Chesley Mires, PharmD, MPH, BCPS, CPP Clinical Pharmacist (Rheumatology and Pulmonology)

## 2022-11-26 ENCOUNTER — Other Ambulatory Visit (HOSPITAL_COMMUNITY): Payer: Self-pay

## 2022-11-26 NOTE — Telephone Encounter (Signed)
Received notification from  [employer]A  regarding a prior authorization for OFEV. Authorization has been APPROVED from 11/24/22 to 11/24/23. Approval letter sent to scan center.  Per test claim, copay for 30 days supply is $2802.67  Authorization # [id number]  ATC patient to discuss BIV findings for Esbriet/pirfenidone/Ofev. I spoke with patient regarding option for [facility] for pirfenidone (flat ~$250 per month). Advised that there is no pt assistance program for pirfenidone. Also talked about Ofev pt assistance program. However he states that he qualified for research study so will stick withthat for now.  Chesley Mires, PharmD, MPH, BCPS, CPP Clinical Pharmacist (Rheumatology and Pulmonology)

## 2022-11-29 ENCOUNTER — Ambulatory Visit (HOSPITAL_BASED_OUTPATIENT_CLINIC_OR_DEPARTMENT_OTHER)
Admission: RE | Admit: 2022-11-29 | Discharge: 2022-11-29 | Disposition: A | Discharge status: Home / Self Care | Payer: Self-pay | Source: Ambulatory Visit | Attending: Internal Medicine | Admitting: Internal Medicine

## 2022-11-29 ENCOUNTER — Other Ambulatory Visit: Payer: Self-pay | Admitting: Internal Medicine

## 2022-11-29 ENCOUNTER — Encounter (INDEPENDENT_AMBULATORY_CARE_PROVIDER_SITE_OTHER): Payer: PPO | Admitting: Internal Medicine

## 2022-11-29 DIAGNOSIS — J84112 Idiopathic pulmonary fibrosis: Secondary | ICD-10-CM

## 2022-11-29 DIAGNOSIS — J479 Bronchiectasis, uncomplicated: Secondary | ICD-10-CM | POA: Diagnosis not present

## 2022-11-29 DIAGNOSIS — Z006 Encounter for examination for normal comparison and control in clinical research program: Secondary | ICD-10-CM

## 2022-11-29 DIAGNOSIS — J8410 Pulmonary fibrosis, unspecified: Secondary | ICD-10-CM | POA: Diagnosis not present

## 2022-11-29 DIAGNOSIS — R918 Other nonspecific abnormal finding of lung field: Secondary | ICD-10-CM | POA: Diagnosis not present

## 2022-11-29 NOTE — Patient Instructions (Signed)
ICD-10-CM   1. Research subject  Z00.6     2. IPF (idiopathic pulmonary fibrosis)  J84.112       Per protocol

## 2022-11-29 NOTE — Progress Notes (Signed)
TTitle: A Phase 2 Multicenter, Randomized, Double-blind, Placebo Controlled Study to Evaluate the Safety, Tolerability, Pharmacokinetics, and Efficacy of TTI-101 in Participants with Idiopathic Pulmonary Fibrosis    Dose and Duration of Treatment: 100 participants will be enrolled using a 1:1:1:1 randomization ratio over a 20 week period (4 week screening period, 12 week treatment period and a 4 week follow-up period).   25 participants: TTI-101 400 mg/day   25 participants: TTI-101 800 mg/day   25 participants: TTI-101 1200 mg/day   25 participants: placebo   Protocol # TVD-101-003P    Sponsor: [employer], [address], [address] [place], [place] [address] [place]    Mechanism of action: TTI-101 is a small-molecule inhibitor of STAT3 (signal transducer and activator of transcription 3). STAT3 plays a major role in the cellular processes involved in fibrosis including fibroblast proliferation. TTI-101 binds tightly to STAT3 which prevents STAT3 recruitment to signaling complexes that contain activated tyrosine kinases, thereby preventing STAT3 phosphorylation on Y705 and STAT3 activation. Binding of TTI-101 to STAT3 also prevents STAT3 dimerization. As such, TTI-101 is expected to prevent or reverse progression of fibrosis in IPF patients.  Key Inclusion Criteria: Age ? 40 years  Diagnosed with IPF based on the 2018 or 2022 ATS/ERS/JRS/ALAT guidelines within 5 years  Chest HRCT performed within 12 months meeting requirements for IPF Extent of fibrosis > emphysematous changes on the HRCT Meeting all the following during screening confirmed by central review,   >45% of predicted FVC and a (FEV1)/FVC ?0.7  A predicted DLCO (Hb corrected) >30%  SpO2 ? 88% with up to 2L O2/min by pulse oximetry at rest  If currently receiving nintedanib, stable dose for >3 months  Key Exclusion Criteria: Known to have the following diseases at screening: Uncontrolled pulmonary  hypertension, or pulmonary hypertension requiring chronic medical treatment Congestive heart failure - NYHA Class III or IV   Drug-induced interstitial pneumonia, pneumoconiosis, hypersensitivity pneumonitis, or radiation pneumonitis   Collagen vascular disease, autoimmune disease, sarcoidosis, granulomatous disease Severe hypertension (BP ? 160/100 despite maximal therapy within 3 months of visit 1) Myocardial infarction, unstable cardiac angina, or history of thrombotic event within 6 months of screening QTcF >450 msec for men and >470 msec for women at screening.   History of significant/symptomatic bradycardia long QT syndrome or  Uncorrected hypomagnesemia or hypokalemia or Heart rate <50 bpm at rest   Unresolved respiratory tract infection within 4 weeks or an acute exacerbation of IPF within 3 months  Active cancer or a history of cancer with a significant risk of recurrence during the study.  eGFR ?64mL/min/1.73 m2, Hb?10g/dL, ONG<2952/WU1 , platelets <100,000/mm3 , serum total bilirubin >1.5 x ULN, liver enzymes ? 2 x ULN Receiving steroids > 10 mg/day of prednisolone or its equivalent within 2 weeks   Immunosuppressive agents within 4 weeks  Received pirfenidone within 3 months, smoking within 3 months   Pharmacokinetics: (ADME) - Contraindications: The SEDD formulation for TTI-101 provided greater systemic exposures relative to the Labrasol/PEG formulation. The SEDD formulation decreased the pill burden while increasing systemic exposures. The half-life of the drug in participants generally ranged from 4-8 h. The half-life data are supportive of the BID dose schedule. The administration of TTI-101 is limited to investigational use only and limited to the oral route of administration. TTI-101 is contraindicated for use in participants with known hypersensitivity to any of the excipients. Special Warnings/Considerations: Insufficient experience exists with TTI-101 to provide  comprehensive warning guidance beyond what is standard for any  investigational drug.  Interactions:  Studies indicated the potential for strong induction of CYP1A2 by TTI-101. As expected, TTI-101 did induce metabolism of pirfenidone, known to be primarily metabolized by CYP1A2. There was no interaction shown between TTI-101 and nintedanib. Drug Interaction Studies: [facility] conducted a DDI study in normal healthy volunteers given TTI-101 and either pirfenidone or nintedanib. Serious Adverse Reactions Observed in Clinical studies: 32 participants out of 105 reported SAE. (30.5%). However all SAEs reported were from study 2016-0842 (participants with solid tumors). No SAEs were reported in the DDI study (Healthy volunteer). Majority of SAE were GI disorders.  Serious Adverse Reactions Observed Postmarketing: To date, TTI-101 has not been registered for use or marketed in any jurisdiction. Safety Data:  The safety data from study 2016-0842 demonstrates the most frequent AEs are GI-related ( nausea, vomiting, change in appetite, diarrhea, abdominal pain) and related to liver function (elevated AST and ALT).  Fewer AEs were reported with the SDD formulation. EKG - PR prolongation was seen in 1 out of 105 participants. (1%)    Overall Adverse events in total participants N=105 (%) Severity  Grade1-mild Grade2-Moderate Grade3-Severe Formulation 1  N=15 Formulation 2    N=47 Formulation 3 (SDD)  N=48*  Diarrhea 30 (28.6%) 24 (22.9%) 7 (6.7%) 8 (7.6%) 6 (40.0%) 21 (44.7%) 3 (6.3%)  Nausea 14 (13.3%) 9 (8.6%) 4 (3.8%) 1 (1.0%) 4 (26.7%) 7 (14.9%) 3 (6.3%)  Vomiting 7 (6.7%) 5 (4.8%) 2 (1.9%) - 3 (20.0%) 2 (4.3%) 2 (4.2%)   Abdominal pain 3 (2.9%) 2 (1.9%) 1 (1.0%) - 3 (20.0%) - -  Elevated ALT 7 (6.7%)   4 (3.8%)   3 (2.9%)   3 (2.9%) 2 (13.3%)  4 (8.5%)   1 (2.1%)   Elevated AST 6 (5.7%)   2 (1.9%)   4 (3.8%)   3 (2.9%) 1 (6.7%)  4 (8.5%)  1 (2.1%)   Headache 5 (4.8%) 5  (4.8%) - - - - 5 (10.4%)    TEAE from DDI study alone with Healthy Volunteer N=41 Part 1 nintedanib Part 2 Pirfenidone  Diarrhea  2 (4.9%)     1 (4.8%)  1 (5.0%)  Nausea 2 (4.9%)   1 (4.8%)   1 (5.0%)   Vomiting 1 (2.4%)     -  1 (5.0%)   Abdominal distension 1 (2.4%)     1 (4.8%)  -  Headache 5 (12.2%)   4 (19.0%)  1 (5.0%)    Stability:  The recommended storage condition for TTI-101 tablet, 200 mg and matching placebo is room temperature (20C to 25C or 67F to 82F). Excursions between 15C and 30C (57F and 40F) are allowed.    Xxxx   This visit for Subject Ryan [name] with DOB: 05-29-1946 on 11/29/2022 for the above protocol is Visit/Encounter # research  and is for purpose of V1 randiozation . Subject/LAR expressed continued interest and consent in continuing as a study subject. Subject thanked for participation in research and contribution to science.    S:  This is a randomization visit for the study.  He is currently not on any antifibrotic.  I again spoke to him about that.  He said he spoke to pharmacy and the co-pay is too high and he does not want to do antifibrotic.  I specifically told him that he is getting into research protocol with the placebo.  He says he fully understands that he is the clinical trial and he wants to do this.  He will need  a high-resolution CT chest today.  I reviewed inclusion exclusion criteria.  He will proceed with randomization after that.  He feels his blood sugars under control.   Object  He does have crackles.  Otherwise nonfocal exam   A/P REsearch IPF  Plan  - Continue per protocol randomization     SIGNATURE    Dr. Kalman Shan, M.D., F.C.C.P, ACRP-CPI Pulmonary and Critical Care Medicine Research Investigator, [facility] Staff Physician, [facility] Center Director - Interstitial Lung Disease  Program  Pulmonary Fibrosis [facility] [place] [facility] and [facility] [place], [place], [address]   Pager: [phone], If no answer  OR between  19:00-7:00h: page [phone] Telephone (research): [phone]  5:37 PM 11/29/2022   5:37 PM 11/29/2022

## 2022-12-01 NOTE — Research (Unsigned)
TTitle: A Phase 2 Multicenter, Randomized, Double-blind, Placebo Controlled Study to Evaluate the Safety, Tolerability, Pharmacokinetics, and Efficacy of TTI-101 in Participants with Idiopathic Pulmonary Fibrosis    Dose and Duration of Treatment: 100 participants will be enrolled using a 1:1:1:1 randomization ratio over a 20 week period (4 week screening period, 12 week treatment period and a 4 week follow-up period).   25 participants: TTI-101 400 mg/day   25 participants: TTI-101 800 mg/day   25 participants: TTI-101 1200 mg/day   25 participants: placebo   Protocol # [id number]    Sponsor: [employer] [address], [address] [place], [place] [address] [place]  Protocol Version/Amendment: Amendment 3.0, date  18 Jun 2022,  for 11/29/2022  Below information reflects this PA -  Consent Version: ver.2  for 12/01/2022 date of 10 Aug 2022  Investigator Brochure Version: Edition Number 7.0  18 Jun 2022  below information reflects this IB   Designer, fashion/clothing Product: TTI-101        Mechanism of action: TTI-101 is a small-molecule inhibitor of STAT3 (signal transducer and activator of transcription 3). STAT3 plays a major role in the cellular processes involved in fibrosis including fibroblast proliferation. TTI-101 binds tightly to STAT3 which prevents STAT3 recruitment to signaling complexes that contain activated tyrosine kinases, thereby preventing STAT3 phosphorylation on Y705 and STAT3 activation. Binding of TTI-101 to STAT3 also prevents STAT3 dimerization. As such, TTI-101 is expected to prevent or reverse progression of fibrosis in IPF patients.  Key Inclusion Criteria: Age ? 40 years  Diagnosed with IPF based on the 2018 or 2022 ATS/ERS/JRS/ALAT guidelines within 5 years  Chest HRCT performed within 12 months meeting requirements for IPF Extent of fibrosis > emphysematous changes on the HRCT Meeting all the following during screening confirmed by central  review,   >45% of predicted FVC and a (FEV1)/FVC ?0.7  A predicted DLCO (Hb corrected) >30%  SpO2 ? 88% with up to 2L O2/min by pulse oximetry at rest  If currently receiving nintedanib, stable dose for >3 months  Key Exclusion Criteria: Known to have the following diseases at screening: Uncontrolled pulmonary hypertension, or pulmonary hypertension requiring chronic medical treatment Congestive heart failure - NYHA Class III or IV   Drug-induced interstitial pneumonia, pneumoconiosis, hypersensitivity pneumonitis, or radiation pneumonitis   Collagen vascular disease, autoimmune disease, sarcoidosis, granulomatous disease Severe hypertension (BP ? 160/100 despite maximal therapy within 3 months of visit 1) Myocardial infarction, unstable cardiac angina, or history of thrombotic event within 6 months of screening QTcF >450 msec for men and >470 msec for women at screening.   History of significant/symptomatic bradycardia long QT syndrome or  Uncorrected hypomagnesemia or hypokalemia or Heart rate <50 bpm at rest   Unresolved respiratory tract infection within 4 weeks or an acute exacerbation of IPF within 3 months  Active cancer or a history of cancer with a significant risk of recurrence during the study.  eGFR ?52mL/min/1.73 m2, Hb?10g/dL, BJY<7829/FA2 , platelets <100,000/mm3 , serum total bilirubin >1.5 x ULN, liver enzymes ? 2 x ULN Receiving steroids > 10 mg/day of prednisolone or its equivalent within 2 weeks   Immunosuppressive agents within 4 weeks  Received pirfenidone within 3 months, smoking within 3 months   Pharmacokinetics: (ADME) - Contraindications: The SEDD formulation for TTI-101 provided greater systemic exposures relative to the Labrasol/PEG formulation. The SEDD formulation decreased the pill burden while increasing systemic exposures. The half-life of the drug in participants generally ranged from 4-8 h. The half-life data  are supportive of the BID dose schedule. The  administration of TTI-101 is limited to investigational use only and limited to the oral route of administration. TTI-101 is contraindicated for use in participants with known hypersensitivity to any of the excipients. Special Warnings/Considerations: Insufficient experience exists with TTI-101 to provide comprehensive warning guidance beyond what is standard for any investigational drug.  Interactions:  Studies indicated the potential for strong induction of CYP1A2 by TTI-101. As expected, TTI-101 did induce metabolism of pirfenidone, known to be primarily metabolized by CYP1A2. There was no interaction shown between TTI-101 and nintedanib. Drug Interaction Studies: [facility] conducted a DDI study in normal healthy volunteers given TTI-101 and either pirfenidone or nintedanib. Serious Adverse Reactions Observed in Clinical studies: 32 participants out of 105 reported SAE. (30.5%). However all SAEs reported were from study 2016-0842 (participants with solid tumors). No SAEs were reported in the DDI study (Healthy volunteer). Majority of SAE were GI disorders.  Serious Adverse Reactions Observed Postmarketing: To date, TTI-101 has not been registered for use or marketed in any jurisdiction. Safety Data:  The safety data from study 2016-0842 demonstrates the most frequent AEs are GI-related ( nausea, vomiting, change in appetite, diarrhea, abdominal pain) and related to liver function (elevated AST and ALT).  Fewer AEs were reported with the SDD formulation. EKG - PR prolongation was seen in 1 out of 105 participants. (1%)    Overall Adverse events in total participants N=105 (%) Severity  Grade1-mild Grade2-Moderate Grade3-Severe Formulation 1  N=15 Formulation 2    N=47 Formulation 3 (SDD)  N=48*  Diarrhea 30 (28.6%) 24 (22.9%) 7 (6.7%) 8 (7.6%) 6 (40.0%) 21 (44.7%) 3 (6.3%)  Nausea 14 (13.3%) 9 (8.6%) 4 (3.8%) 1 (1.0%) 4 (26.7%) 7 (14.9%) 3 (6.3%)  Vomiting 7 (6.7%) 5 (4.8%) 2  (1.9%) - 3 (20.0%) 2 (4.3%) 2 (4.2%)   Abdominal pain 3 (2.9%) 2 (1.9%) 1 (1.0%) - 3 (20.0%) - -  Elevated ALT 7 (6.7%)   4 (3.8%)   3 (2.9%)   3 (2.9%) 2 (13.3%)  4 (8.5%)   1 (2.1%)   Elevated AST 6 (5.7%)   2 (1.9%)   4 (3.8%)   3 (2.9%) 1 (6.7%)  4 (8.5%)  1 (2.1%)   Headache 5 (4.8%) 5 (4.8%) - - - - 5 (10.4%)    TEAE from DDI study alone with Healthy Volunteer N=41 Part 1 nintedanib Part 2 Pirfenidone  Diarrhea  2 (4.9%)     1 (4.8%)  1 (5.0%)  Nausea 2 (4.9%)   1 (4.8%)   1 (5.0%)   Vomiting 1 (2.4%)     -  1 (5.0%)   Abdominal distension 1 (2.4%)     1 (4.8%)  -  Headache 5 (12.2%)   4 (19.0%)  1 (5.0%)    Stability:  The recommended storage condition for TTI-101 tablet, 200 mg and matching placebo is room temperature (20C to 25C or 25F to 83F). Excursions between 15C and 30C (66F and 39F) are allowed.     [facility] [place] Clinical Research Coordinator note:   This visit for Subject Ryan [name] with DOB: 09-22-1945 on 12/01/2022 for the above protocol is Visit/Encounter # 1  and is for purpose of research.   The consent for this encounter is under Protocol Version 3.0, Investigator Brochure Version 7.0, Consent Version 10 Aug 2022 and  is currently IRB approved.   Subject expressed continued interest and consent in continuing as a study subject. Subject confirmed that  there was     change in contact information (e.g. address, telephone, email). Subject thanked for participation in research and contribution to science. In this visit 11/29/2022 the subject will be evaluated by Principal Investigator named Dr Rachell Cipro. This research coordinator has verified that the above investigator is  up to date with his/her training logs.   The Subject was informed that the PI  continues to have oversight of the subject's visits and course through relevant discussions, reviews, and also specifically of this visit by routing of this note to the PI.    1. This  visit is a key visit of randomization. The PI is  available for this visit.     All assessments associated with visit 1 were completed and patient was randomized and given investigational product.  Please see subject binder for further details.  Signed by  Christell Constant MD  Clinical Research Coordinator [facility]  [place], [place] 8:29 PM 12/01/2022

## 2022-12-07 ENCOUNTER — Emergency Department (HOSPITAL_BASED_OUTPATIENT_CLINIC_OR_DEPARTMENT_OTHER)
Admission: EM | Admit: 2022-12-07 | Discharge: 2022-12-08 | Disposition: A | Discharge status: Home / Self Care | Payer: PPO | Source: Home / Self Care | Attending: Emergency Medicine | Admitting: Emergency Medicine

## 2022-12-07 ENCOUNTER — Emergency Department (HOSPITAL_BASED_OUTPATIENT_CLINIC_OR_DEPARTMENT_OTHER): Payer: PPO

## 2022-12-07 ENCOUNTER — Telehealth: Payer: Self-pay | Admitting: Internal Medicine

## 2022-12-07 ENCOUNTER — Other Ambulatory Visit: Payer: Self-pay

## 2022-12-07 ENCOUNTER — Encounter (HOSPITAL_BASED_OUTPATIENT_CLINIC_OR_DEPARTMENT_OTHER): Payer: Self-pay | Admitting: Pediatrics

## 2022-12-07 DIAGNOSIS — K529 Noninfective gastroenteritis and colitis, unspecified: Secondary | ICD-10-CM | POA: Diagnosis present

## 2022-12-07 DIAGNOSIS — Z794 Long term (current) use of insulin: Secondary | ICD-10-CM | POA: Insufficient documentation

## 2022-12-07 DIAGNOSIS — R197 Diarrhea, unspecified: Secondary | ICD-10-CM

## 2022-12-07 DIAGNOSIS — Z7901 Long term (current) use of anticoagulants: Secondary | ICD-10-CM | POA: Insufficient documentation

## 2022-12-07 DIAGNOSIS — I10 Essential (primary) hypertension: Secondary | ICD-10-CM | POA: Diagnosis not present

## 2022-12-07 DIAGNOSIS — I2510 Atherosclerotic heart disease of native coronary artery without angina pectoris: Secondary | ICD-10-CM | POA: Diagnosis present

## 2022-12-07 DIAGNOSIS — K219 Gastro-esophageal reflux disease without esophagitis: Secondary | ICD-10-CM | POA: Diagnosis present

## 2022-12-07 DIAGNOSIS — R112 Nausea with vomiting, unspecified: Secondary | ICD-10-CM | POA: Diagnosis not present

## 2022-12-07 DIAGNOSIS — I700 Atherosclerosis of aorta: Secondary | ICD-10-CM | POA: Diagnosis not present

## 2022-12-07 DIAGNOSIS — R109 Unspecified abdominal pain: Secondary | ICD-10-CM | POA: Diagnosis not present

## 2022-12-07 DIAGNOSIS — I110 Hypertensive heart disease with heart failure: Secondary | ICD-10-CM | POA: Insufficient documentation

## 2022-12-07 DIAGNOSIS — Z79899 Other long term (current) drug therapy: Secondary | ICD-10-CM | POA: Diagnosis not present

## 2022-12-07 DIAGNOSIS — I5032 Chronic diastolic (congestive) heart failure: Secondary | ICD-10-CM | POA: Diagnosis present

## 2022-12-07 DIAGNOSIS — E7132 Disorders of ketone metabolism: Secondary | ICD-10-CM | POA: Diagnosis not present

## 2022-12-07 DIAGNOSIS — R739 Hyperglycemia, unspecified: Secondary | ICD-10-CM | POA: Diagnosis not present

## 2022-12-07 DIAGNOSIS — Z7983 Long term (current) use of bisphosphonates: Secondary | ICD-10-CM | POA: Diagnosis not present

## 2022-12-07 DIAGNOSIS — K7460 Unspecified cirrhosis of liver: Secondary | ICD-10-CM | POA: Diagnosis present

## 2022-12-07 DIAGNOSIS — E785 Hyperlipidemia, unspecified: Secondary | ICD-10-CM | POA: Diagnosis present

## 2022-12-07 DIAGNOSIS — E1165 Type 2 diabetes mellitus with hyperglycemia: Secondary | ICD-10-CM | POA: Insufficient documentation

## 2022-12-07 DIAGNOSIS — R000 Tachycardia, unspecified: Secondary | ICD-10-CM | POA: Diagnosis not present

## 2022-12-07 DIAGNOSIS — E860 Dehydration: Secondary | ICD-10-CM | POA: Diagnosis present

## 2022-12-07 DIAGNOSIS — I480 Paroxysmal atrial fibrillation: Secondary | ICD-10-CM | POA: Diagnosis present

## 2022-12-07 DIAGNOSIS — I509 Heart failure, unspecified: Secondary | ICD-10-CM | POA: Insufficient documentation

## 2022-12-07 DIAGNOSIS — R519 Headache, unspecified: Secondary | ICD-10-CM | POA: Diagnosis not present

## 2022-12-07 DIAGNOSIS — Z87891 Personal history of nicotine dependence: Secondary | ICD-10-CM | POA: Diagnosis not present

## 2022-12-07 DIAGNOSIS — J84112 Idiopathic pulmonary fibrosis: Secondary | ICD-10-CM | POA: Diagnosis present

## 2022-12-07 DIAGNOSIS — T730XXA Starvation, initial encounter: Secondary | ICD-10-CM | POA: Diagnosis present

## 2022-12-07 DIAGNOSIS — Z8249 Family history of ischemic heart disease and other diseases of the circulatory system: Secondary | ICD-10-CM | POA: Diagnosis not present

## 2022-12-07 DIAGNOSIS — M810 Age-related osteoporosis without current pathological fracture: Secondary | ICD-10-CM | POA: Diagnosis present

## 2022-12-07 DIAGNOSIS — Z83438 Family history of other disorder of lipoprotein metabolism and other lipidemia: Secondary | ICD-10-CM | POA: Diagnosis not present

## 2022-12-07 DIAGNOSIS — R5383 Other fatigue: Secondary | ICD-10-CM | POA: Diagnosis not present

## 2022-12-07 DIAGNOSIS — I252 Old myocardial infarction: Secondary | ICD-10-CM | POA: Diagnosis not present

## 2022-12-07 DIAGNOSIS — R059 Cough, unspecified: Secondary | ICD-10-CM | POA: Diagnosis present

## 2022-12-07 DIAGNOSIS — E876 Hypokalemia: Secondary | ICD-10-CM | POA: Diagnosis present

## 2022-12-07 DIAGNOSIS — R1110 Vomiting, unspecified: Secondary | ICD-10-CM | POA: Diagnosis not present

## 2022-12-07 DIAGNOSIS — E872 Acidosis, unspecified: Secondary | ICD-10-CM | POA: Diagnosis not present

## 2022-12-07 DIAGNOSIS — E1110 Type 2 diabetes mellitus with ketoacidosis without coma: Secondary | ICD-10-CM | POA: Diagnosis present

## 2022-12-07 LAB — CBC
HCT: 45.6 % (ref 39.0–52.0)
Hemoglobin: 14.8 g/dL (ref 13.0–17.0)
MCH: 29.7 pg (ref 26.0–34.0)
MCHC: 32.5 g/dL (ref 30.0–36.0)
MCV: 91.6 fL (ref 80.0–100.0)
Platelets: 339 10*3/uL (ref 150–400)
RBC: 4.98 MIL/uL (ref 4.22–5.81)
RDW: 13.7 % (ref 11.5–15.5)
WBC: 16.2 10*3/uL — ABNORMAL HIGH (ref 4.0–10.5)
nRBC: 0 % (ref 0.0–0.2)

## 2022-12-07 LAB — BASIC METABOLIC PANEL
Anion gap: 15 (ref 5–15)
BUN: 29 mg/dL — ABNORMAL HIGH (ref 8–23)
CO2: 18 mmol/L — ABNORMAL LOW (ref 22–32)
Calcium: 8.8 mg/dL — ABNORMAL LOW (ref 8.9–10.3)
Chloride: 101 mmol/L (ref 98–111)
Creatinine, Ser: 1.57 mg/dL — ABNORMAL HIGH (ref 0.61–1.24)
GFR, Estimated: 45 mL/min — ABNORMAL LOW (ref >60)
Glucose, Bld: 324 mg/dL — ABNORMAL HIGH (ref 70–99)
Potassium: 4.7 mmol/L (ref 3.5–5.1)
Sodium: 134 mmol/L — ABNORMAL LOW (ref 135–145)

## 2022-12-07 LAB — I-STAT VENOUS BLOOD GAS, ED
Acid-base deficit: 8 mmol/L — ABNORMAL HIGH (ref 0.0–2.0)
Bicarbonate: 17.5 mmol/L — ABNORMAL LOW (ref 20.0–28.0)
Calcium, Ion: 1.17 mmol/L (ref 1.15–1.40)
HCT: 45 % (ref 39.0–52.0)
Hemoglobin: 15.3 g/dL (ref 13.0–17.0)
O2 Saturation: 26 %
Potassium: 4.3 mmol/L (ref 3.5–5.1)
Sodium: 137 mmol/L (ref 135–145)
TCO2: 19 mmol/L — ABNORMAL LOW (ref 22–32)
pCO2, Ven: 34.8 mmHg — ABNORMAL LOW (ref 44–60)
pH, Ven: 7.308 (ref 7.25–7.43)
pO2, Ven: 19 mmHg — CL (ref 32–45)

## 2022-12-07 LAB — CBG MONITORING, ED
Glucose-Capillary: 291 mg/dL — ABNORMAL HIGH (ref 70–99)
Glucose-Capillary: 169 mg/dL — ABNORMAL HIGH (ref 70–99)

## 2022-12-07 LAB — HEPATIC FUNCTION PANEL
ALT: 16 U/L (ref 0–44)
AST: 24 U/L (ref 15–41)
Albumin: 3.8 g/dL (ref 3.5–5.0)
Alkaline Phosphatase: 79 U/L (ref 38–126)
Bilirubin, Direct: 0.1 mg/dL (ref 0.0–0.2)
Indirect Bilirubin: 1 mg/dL — ABNORMAL HIGH (ref 0.3–0.9)
Total Bilirubin: 1.1 mg/dL (ref 0.3–1.2)
Total Protein: 8.4 g/dL — ABNORMAL HIGH (ref 6.5–8.1)

## 2022-12-07 LAB — TROPONIN I (HIGH SENSITIVITY)
Troponin I (High Sensitivity): 29 ng/L — ABNORMAL HIGH (ref <18)
Troponin I (High Sensitivity): 29 ng/L — ABNORMAL HIGH (ref <18)

## 2022-12-07 LAB — LACTIC ACID, PLASMA
Lactic Acid, Venous: 2.7 mmol/L (ref 0.5–1.9)
Lactic Acid, Venous: 1.7 mmol/L (ref 0.5–1.9)

## 2022-12-07 LAB — BETA-HYDROXYBUTYRIC ACID: Beta-Hydroxybutyric Acid: 2.57 mmol/L — ABNORMAL HIGH (ref 0.05–0.27)

## 2022-12-07 LAB — LIPASE, BLOOD: Lipase: 86 U/L — ABNORMAL HIGH (ref 11–51)

## 2022-12-07 MED ORDER — ONDANSETRON 4 MG PO TBDP
4.0000 mg | ORAL_TABLET | Freq: Once | ORAL | Status: AC
  Administered 2022-12-07: 4 mg via ORAL
  Filled 2022-12-07: qty 1

## 2022-12-07 MED ORDER — SODIUM CHLORIDE 0.9 % IV BOLUS
1000.0000 mL | Freq: Once | INTRAVENOUS | Status: AC
  Administered 2022-12-07: 1000 mL via INTRAVENOUS

## 2022-12-07 MED ORDER — ONDANSETRON HCL 4 MG/2ML IJ SOLN
4.0000 mg | Freq: Once | INTRAMUSCULAR | Status: AC
  Administered 2022-12-07: 4 mg via INTRAVENOUS
  Filled 2022-12-07: qty 2

## 2022-12-07 MED ORDER — ONDANSETRON 4 MG PO TBDP
4.0000 mg | ORAL_TABLET | Freq: Three times a day (TID) | ORAL | 0 refills | Status: DC | PRN
  Filled 2022-12-08: qty 20, 7d supply, fill #0

## 2022-12-07 NOTE — ED Triage Notes (Signed)
Reports was sent by PCP for high blood sugar; has had NV x 7 days and has not eaten,

## 2022-12-07 NOTE — Telephone Encounter (Signed)
Telephoned Ryan [name] back to advise him to call 911 if unable to get to ER.  Patient's family member had arrived and they were on way to [facility] ER on [address].  Pt telephone numbers: home [phone]. Cell phone [phone]

## 2022-12-07 NOTE — ED Notes (Signed)
Pt reports taking a trial medication for pulmonary fibrosis. Pt states the medication has made him nauseous and has been vomiting for the last week. Pt states the last time he had this medication was Monday morning. Pt also reports hx of diabetes

## 2022-12-07 NOTE — ED Notes (Signed)
Pt provided water, per his request. EDP Tegeler aware and agreeable.   Pt reminded of need for urine sample.  Urinal at bedside.

## 2022-12-07 NOTE — ED Notes (Signed)
Pt states he is on a "IPF trial to find a cure". He mentions NVD is a side effect of trial.

## 2022-12-07 NOTE — ED Provider Notes (Signed)
[name] Provider Note   CSN: [id number] Arrival date & time: 12/07/22  1556     History  Chief Complaint  Patient presents with   Hyperglycemia    Ryan [name] is a 77 y.o. male.  The history is provided by the patient and medical records. No language interpreter was used.  Hyperglycemia Blood sugar level PTA:  >500 Severity:  Severe Onset quality:  Gradual Duration:  1 week Timing:  Intermittent Progression:  Waxing and waning Chronicity:  New Diabetes status:  Controlled with insulin and controlled with oral medications Context: change in medication (new fibrosis medication)   Relieved by:  Nothing Ineffective treatments:  None tried Associated symptoms: dehydration, fatigue, nausea and vomiting   Associated symptoms: no abdominal pain (mild cramping when vomiting but not persistent), no chest pain, no confusion, no diaphoresis, no dysuria, no fever, no shortness of breath and no weakness        Home Medications Prior to Admission medications   Medication Sig Start Date End Date Taking? Authorizing Provider  alendronate (FOSAMAX) 70 MG tablet Take 1 tablet (70 mg total) by mouth every 7 (seven) days. Take with a full glass of water on an empty stomach. 09/09/22   Sandford Craze, NP  apixaban (ELIQUIS) 5 MG TABS tablet Take 1 tablet (5 mg total) by mouth 2 (two) times daily. 11/10/22   Pricilla Riffle, MD  atorvastatin (LIPITOR) 40 MG tablet TAKE 1 TABLET (40 MG TOTAL) BY MOUTH DAILY. 09/01/22   Sandford Craze, NP  blood glucose meter kit and supplies by Other route See admin instructions. Check blood sugar 6-7 times daily    [provider]  Calcium Carbonate-Vitamin D 600-400 MG-UNIT tablet Take 1 tablet by mouth 2 (two) times daily. Reported on 01/27/2016    [provider]  cholecalciferol (VITAMIN D3) 25 MCG (1000 UNIT) tablet Take 1,000 Units by mouth daily.    [provider]   Glucagon, rDNA, (GLUCAGON EMERGENCY) 1 MG KIT Inject 1 mg into the muscle once as needed for up to 1 dose. Patient taking differently: Inject 1 mg into the muscle as needed (blood sugar below 45). 02/26/21   Carlus Pavlov, MD  insulin aspart (NOVOLOG) 100 UNIT/ML injection Inject 2-3 Units into the skin 3 (three) times daily before meals. 05/28/22   Carlus Pavlov, MD  insulin degludec (TRESIBA FLEXTOUCH) 100 UNIT/ML FlexTouch Pen Inject 8 Units into the skin daily. Will start in 2024 after he completes current supply of Levemir. 07/13/22   [name], [name], RPH-CPP  insulin detemir (LEVEMIR) 100 UNIT/ML injection INJECT 0.8 ML (8 UNITS) INTO THE SKIN AT BEDTIME 05/28/22   Carlus Pavlov, MD  Insulin Syringe-Needle U-100 (TRUEPLUS INSULIN SYRINGE) 31G X 5/16" 0.5 ML MISC USE TO INJECT INSULIN 4 TIMES A DAY 03/18/21   Carlus Pavlov, MD  metoprolol succinate (TOPROL-XL) 25 MG 24 hr tablet TAKE 1 TABLET (25 MG TOTAL) BY MOUTH DAILY. 09/01/22   Sandford Craze, NP      Allergies    Other and Lisinopril    Review of Systems   Review of Systems  Constitutional:  Positive for fatigue. Negative for chills, diaphoresis and fever.  HENT:  Negative for congestion.   Eyes:  Negative for visual disturbance.  Respiratory:  Negative for cough, chest tightness and shortness of breath.   Cardiovascular:  Negative for chest pain, palpitations and leg swelling.  Gastrointestinal:  Positive for diarrhea, nausea and vomiting. Negative for abdominal pain (  mild cramping when vomiting but not persistent) and constipation.  Genitourinary:  Negative for dysuria and flank pain.  Musculoskeletal:  Negative for back pain, neck pain and neck stiffness.  Skin:  Negative for rash and wound.  Neurological:  Negative for weakness, light-headedness, numbness and headaches.  Psychiatric/Behavioral:  Negative for agitation and confusion.   All other systems reviewed and are negative.   Physical Exam Updated  Vital Signs BP 111/72 (BP Location: Left Arm)   Pulse 99   Temp 97.8 F (36.6 C) (Oral)   Resp 17   Ht 5\' 8"  (1.727 m)   Wt 69.9 kg   SpO2 99%   BMI 23.42 kg/m  Physical Exam Vitals and nursing note reviewed.  Constitutional:      General: He is not in acute distress.    Appearance: He is well-developed. He is not ill-appearing, toxic-appearing or diaphoretic.  HENT:     Head: Normocephalic and atraumatic.     Nose: Nose normal.     Mouth/Throat:     Mouth: Mucous membranes are dry.     Pharynx: No oropharyngeal exudate or posterior oropharyngeal erythema.  Eyes:     Extraocular Movements: Extraocular movements intact.     Conjunctiva/sclera: Conjunctivae normal.     Pupils: Pupils are equal, round, and reactive to light.  Cardiovascular:     Rate and Rhythm: Normal rate and regular rhythm.     Heart sounds: No murmur heard. Pulmonary:     Effort: Pulmonary effort is normal. No respiratory distress.     Breath sounds: Rhonchi present. No wheezing or rales.  Chest:     Chest wall: No tenderness.  Abdominal:     Palpations: Abdomen is soft.     Tenderness: There is no abdominal tenderness.  Musculoskeletal:        General: No swelling or tenderness.     Cervical back: Neck supple. No tenderness.     Right lower leg: No edema.     Left lower leg: No edema.  Skin:    General: Skin is warm and dry.     Capillary Refill: Capillary refill takes less than 2 seconds.     Findings: No erythema or rash.  Neurological:     General: No focal deficit present.     Mental Status: He is alert.  Psychiatric:        Mood and Affect: Mood normal.     ED Results / Procedures / Treatments   Labs (all labs ordered are listed, but only abnormal results are displayed) Labs Reviewed  BASIC METABOLIC PANEL - Abnormal; Notable for the following components:      Result Value   Sodium 134 (*)    CO2 18 (*)    Glucose, Bld 324 (*)    BUN 29 (*)    Creatinine, Ser 1.57 (*)    Calcium  8.8 (*)    GFR, Estimated 45 (*)    All other components within normal limits  CBC - Abnormal; Notable for the following components:   WBC 16.2 (*)    All other components within normal limits  HEPATIC FUNCTION PANEL - Abnormal; Notable for the following components:   Total Protein 8.4 (*)    Indirect Bilirubin 1.0 (*)    All other components within normal limits  LIPASE, BLOOD - Abnormal; Notable for the following components:   Lipase 86 (*)    All other components within normal limits  LACTIC ACID, PLASMA - Abnormal; Notable for the  following components:   Lactic Acid, Venous 2.7 (*)    All other components within normal limits  BETA-HYDROXYBUTYRIC ACID - Abnormal; Notable for the following components:   Beta-Hydroxybutyric Acid 2.57 (*)    All other components within normal limits  CBG MONITORING, ED - Abnormal; Notable for the following components:   Glucose-Capillary 291 (*)    All other components within normal limits  I-STAT VENOUS BLOOD GAS, ED - Abnormal; Notable for the following components:   pCO2, Ven 34.8 (*)    pO2, Ven 19 (*)    Bicarbonate 17.5 (*)    TCO2 19 (*)    Acid-base deficit 8.0 (*)    All other components within normal limits  CBG MONITORING, ED - Abnormal; Notable for the following components:   Glucose-Capillary 169 (*)    All other components within normal limits  TROPONIN I (HIGH SENSITIVITY) - Abnormal; Notable for the following components:   Troponin I (High Sensitivity) 29 (*)    All other components within normal limits  TROPONIN I (HIGH SENSITIVITY) - Abnormal; Notable for the following components:   Troponin I (High Sensitivity) 29 (*)    All other components within normal limits  CULTURE, BLOOD (ROUTINE X 2)  CULTURE, BLOOD (ROUTINE X 2)  LACTIC ACID, PLASMA  TSH    EKG EKG Interpretation  Date/Time:  Tuesday December 07 2022 18:16:26 EDT Ventricular Rate:  82 PR Interval:  140 QRS Duration: 114 QT Interval:  389 QTC  Calculation: 455 R Axis:   -39 Text Interpretation: Sinus rhythm Atrial premature complex Abnormal R-wave progression, late transition Left ventricular hypertrophy when compared to prior, overall similar appearance with some ST elevations in lead 3.  no STEMI  Confirmed by Theda Belfast ([id number]) on 12/07/2022 6:21:01 PM  Radiology DG Chest Portable 1 View  Result Date: 12/07/2022 CLINICAL DATA:  Cough, nausea, vomiting, diarrhea, and fatigue. EXAM: PORTABLE CHEST 1 VIEW COMPARISON:  Chest radiographs 10/27/2021 and CT 11/29/2022 FINDINGS: The cardiomediastinal silhouette is unchanged with normal heart size. Lung volumes are lower than on the prior radiographs, and there is slight elevation of the right hemidiaphragm. Coarse interstitial densities in both lungs are similar to the prior radiographs. No acute airspace consolidation, sizeable pleural effusion, or pneumothorax is identified. No acute osseous abnormality is seen. IMPRESSION: Chronic interstitial lung disease without evidence of acute cardiopulmonary process. Electronically Signed   By: Sebastian Ache M.D.   On: 12/07/2022 19:09   CT Head Wo Contrast  Result Date: 12/07/2022 CLINICAL DATA:  Head trauma. Fall 1 week ago. Nausea and vomiting and headache. EXAM: CT HEAD WITHOUT CONTRAST TECHNIQUE: Contiguous axial images were obtained from the base of the skull through the vertex without intravenous contrast. RADIATION DOSE REDUCTION: This exam was performed according to the departmental dose-optimization program which includes automated exposure control, adjustment of the mA and/or kV according to patient size and/or use of iterative reconstruction technique. COMPARISON:  Head CT 08/06/2020 FINDINGS: Brain: There is no evidence of an acute infarct, intracranial hemorrhage, mass, midline shift, or extra-axial fluid collection. The ventricles and sulci are within normal limits for age. Periventricular white matter hypodensities are nonspecific but  compatible with minimal chronic small vessel ischemic disease. Vascular: Calcified atherosclerosis at the skull base. No hyperdense vessel. Skull: No acute fracture or suspicious osseous lesion. Sinuses/Orbits: Minimal fluid in the right sphenoid sinus. Mild chronic left ethmoid air cell opacification. Clear mastoid air cells. Bilateral cataract extraction. Other: None. IMPRESSION: No evidence of acute intracranial abnormality. Electronically  Signed   By: Sebastian Ache M.D.   On: 12/07/2022 19:06    Procedures Procedures    Medications Ordered in ED Medications  sodium chloride 0.9 % bolus 1,000 mL (0 mLs Intravenous Stopped 12/07/22 1858)  ondansetron (ZOFRAN) injection 4 mg (4 mg Intravenous Given 12/07/22 1731)  ondansetron (ZOFRAN-ODT) disintegrating tablet 4 mg (4 mg Oral Given 12/07/22 2342)    ED Course/ Medical Decision Making/ A&P                             Medical Decision Making Amount and/or Complexity of Data Reviewed Labs: ordered. Radiology: ordered.  Risk Prescription drug management.   ODIN MARIANI is a 77 y.o. 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  According to patient, he started a new extremital medication recently for his pulmonary fibrosis and for the last week he has had GI symptoms with significant nausea and vomiting, and diarrhea.  He reports he has not been able to keep much down.  He is also had some coughing but is denying any chest pain or shortness of breath.  He denies any back or flank pain and denies any groin symptoms.  He denies any rashes.  He denies any leg pain or leg swelling feel he is feeling dehydrated.  His glucose is also been very elevated despite not eating and drinking they  have been over 500 at times.  I do not see history of DKA initially.  He also reports that last week he had a fall hitting his right eyebrow on the ground.  He reports he has had the nausea and vomiting ever since then but denies any current headache.  Denies any double vision or blurry vision or other vision changes.  He has not yet seen a provider for this head injury on Eliquis.  He reportedly told his PCP about his glucose over 500 so they sent him in for evaluation.  On exam, lungs had some coarseness but there was no murmur.  Chest and abdomen nontender.  Normal bowel sounds.  Flanks nontender.  Dry mucous membranes but otherwise patient well-appearing.  He has an abrasion to his right forehead but had no crepitance or significant tenderness on the forehead and eyebrows.  Extraocular movements were intact and pupils were symmetric and reactive.  Symmetric smile.  Clear speech.  No focal neurologic deficits initially.  Clinically I am concerned about the patient being dehydrated with all this GI fluid loss with nausea, vomiting, diarrhea.  Will get labs to make sure he does not have DKA and will also get workup to rule out other occult infection such as a urinary or pulmonary cause with his coughing.  We will give him some fluids and some nausea medicine.  With his head trauma on Eliquis will get CT head to look for subdural or other intracranial abnormality.  Anticipate reassessment and share decision-making conversation after workup to determine disposition.  6:03 PM Just spoke to the pulmonologist who recommended adding on a troponin with his nausea and vomiting which we will do.  Anticipate reassessment after workup to determine disposition.  Patient reports he is feeling much better.  He does not want to be admitted for this AKI we discovered on  his labs.  His CT imaging of the head showed no intracranial bleed or injury and his chest x-ray did not show pneumonia.  His labs are not  completely consistent with DKA although he does have an elevated beta hydroxy uric acid likely from his recent hemoglobin elevated.  Troponin is stable appearing and improved from prior.  Lipase is slightly elevated which we discussed.  Patient does not want to be admitted and wants to go home.  Suspect either viral infection versus nausea and vomiting related to the recent extramedical medication use.  Patient given prescription for nausea medicine he will follow-up with his PCP and call them tomorrow.  He is agrees with plan of care and other questions or concerns.  Patient discharged in good condition with improvement in symptoms and with reassuring vital signs.        Final Clinical Impression(s) / ED Diagnoses Final diagnoses:  Hyperglycemia  Nausea and vomiting, unspecified vomiting type  Cough, unspecified type  Diarrhea, unspecified type    Rx / DC Orders ED Discharge Orders          Ordered    ondansetron (ZOFRAN-ODT) 4 MG disintegrating tablet  Every 8 hours PRN        12/07/22 2338           Clinical Impression: 1. Hyperglycemia   2. Nausea and vomiting, unspecified vomiting type   3. Cough, unspecified type   4. Diarrhea, unspecified type     Disposition: Discharge  Condition: Good  I have discussed the results, Dx and Tx plan with the pt(& family if present). He/she/they expressed understanding and agree(s) with the plan. Discharge instructions discussed at great length. Strict return precautions discussed and pt &/or family have verbalized understanding of the instructions. No further questions at time of discharge.    New Prescriptions   ONDANSETRON (ZOFRAN-ODT) 4 MG DISINTEGRATING TABLET    Take 1 tablet (4 mg total) by mouth every 8 (eight) hours as needed for nausea or vomiting.    Follow Up: Sandford Craze, NP [address] [place] [address] [phone]     [facility] Emergency Department at [facility] [id number] [place] [address] [phone]       Husam Hohn, Canary Brim, MD 12/08/22 0000

## 2022-12-07 NOTE — Telephone Encounter (Signed)
TTitle: A Phase 2 Multicenter, Randomized, Double-blind, Placebo Controlled Study to Evaluate the Safety, Tolerability, Pharmacokinetics, and Efficacy of TTI-101 in Participants with Idiopathic Pulmonary Fibrosis      Called EDP 6:12 PM 12/07/2022 - Ryan [name]  With Mar 31, 1946  -is now in the ER at [facility].  He has acute kidney injury already.  He has an elevated white count.  With anion gap is not that elevated.  The EDP is going to run through a battery of test and give him hydration.  We discussed about checking lactic acid and liver function test and troponin.  The EDP is already doing all this.  Explained about the potential side effect profile of the investigational medical product [patient just got randomized and we do not know if he is getting the actual drug or placebo].  Patient is to stop study drug which she did yesterday according to the research coordinator.  Will file AE v SAE report 12/08/22     Overall Adverse events in total participants N=105 (%) Severity  Grade1-mild Grade2-Moderate Grade3-Severe Formulation 1  N=15 Formulation 2    N=47 Formulation 3 (SDD)  N=48*  Diarrhea 30 (28.6%) 24 (22.9%) 7 (6.7%) 8 (7.6%) 6 (40.0%) 21 (44.7%) 3 (6.3%)  Nausea 14 (13.3%) 9 (8.6%) 4 (3.8%) 1 (1.0%) 4 (26.7%) 7 (14.9%) 3 (6.3%)  Vomiting 7 (6.7%) 5 (4.8%) 2 (1.9%) - 3 (20.0%) 2 (4.3%) 2 (4.2%)   Abdominal pain 3 (2.9%) 2 (1.9%) 1 (1.0%) - 3 (20.0%) - -  Elevated ALT 7 (6.7%)   4 (3.8%)   3 (2.9%)   3 (2.9%) 2 (13.3%)  4 (8.5%)   1 (2.1%)   Elevated AST 6 (5.7%)   2 (1.9%)   4 (3.8%)   3 (2.9%) 1 (6.7%)  4 (8.5%)  1 (2.1%)   Headache 5 (4.8%) 5 (4.8%) - - - - 5 (10.4%)    TEAE from DDI study alone with Healthy Volunteer N=41 Part 1 nintedanib Part 2 Pirfenidone  Diarrhea  2 (4.9%)     1 (4.8%)  1 (5.0%)  Nausea 2 (4.9%)   1 (4.8%)   1 (5.0%)   Vomiting 1 (2.4%)     -  1 (5.0%)   Abdominal distension 1 (2.4%)     1 (4.8%)  -  Headache 5  (12.2%)   4 (19.0%)  1 (5.0%)      SIGNATURE    Dr. Kalman Shan, M.D., F.C.C.P, ACRP-CPI Pulmonary and Critical Care Medicine Research Investigator, [facility] Health Staff Physician, [facility] Center Director - Interstitial Lung Disease  Program  Pulmonary Fibrosis [facility] [place] [facility] and [facility] @ [facility] [place], [place], [address]   Pager: [phone], If no answer  OR between  19:00-7:00h: page [phone] Telephone (research): [phone]  6:13 PM 12/07/2022   6:13 PM 12/07/2022

## 2022-12-07 NOTE — Telephone Encounter (Signed)
Telephone call from [name] with vomiting and diarrhea which started last Wednesday 12/01/22.  Patient in enrolled in Tvardi research study and was randomized and started IP 11/29/22.  Patient states vomiting is almost constant and much worse than diarrhea.  He is unable to eat and reported a blood sugar today of 500.  He is due to check his sugar again.  He had a fall during the night last Wednesday or Thursday and busted his eye and broke a toe. He held Tvardi medication last night and this morning, last dose taken 12/06/22 am dose. He has continued to take insulin despite not eating. Discussed with Dr. Marchelle Gearing and advised patient to go to ER to be evaluated and check labs.  Tvardi medication discontinued. Recommended [facility] may be closest location.  Patient feels too weak to go today.  Emphasized risk diabetic ketoacidosis and encouraged patient to get family member to take him to ER as soon as possible today.  Asked if easier to get to clinic here and patient said no.

## 2022-12-07 NOTE — Discharge Instructions (Addendum)
Your history, exam, and workup today revealed dehydration from all the nausea and vomiting likely due to your recent medications versus viral infection.  We got imaging of her head did not show evidence of intracranial injury after the fall on Eliquis recently.  We discussed admission for the dehydration and acute kidney injury but given your ability to eat and drink now in feeling better we feel you are safe for discharge home with close PCP follow-up.  If any symptoms change or worsen, please return to the nearest emergency department.

## 2022-12-07 NOTE — ED Notes (Signed)
Pt provided verbal consent to update son, Jonny Ruiz, via phone regarding condition.

## 2022-12-08 ENCOUNTER — Telehealth: Payer: Self-pay | Admitting: Internal Medicine

## 2022-12-08 ENCOUNTER — Other Ambulatory Visit (HOSPITAL_BASED_OUTPATIENT_CLINIC_OR_DEPARTMENT_OTHER): Payer: Self-pay

## 2022-12-08 LAB — TSH: TSH: 2.134 u[IU]/mL (ref 0.350–4.500)

## 2022-12-08 NOTE — Telephone Encounter (Signed)
TTitle: A Phase 2 Multicenter, Randomized, Double-blind, Placebo Controlled Study to Evaluate the Safety, Tolerability, Pharmacokinetics, and Efficacy of TTI-101 in Participants with Idiopathic Pulmonary Fibrosis    Overall Adverse events in total participants N=105 (%) Severity  Grade1-mild Grade2-Moderate Grade3-Severe Formulation 1  N=15 Formulation 2    N=47 Formulation 3 (SDD)  N=48*  Diarrhea 30 (28.6%) 24 (22.9%) 7 (6.7%) 8 (7.6%) 6 (40.0%) 21 (44.7%) 3 (6.3%)  Nausea 14 (13.3%) 9 (8.6%) 4 (3.8%) 1 (1.0%) 4 (26.7%) 7 (14.9%) 3 (6.3%)  Vomiting 7 (6.7%) 5 (4.8%) 2 (1.9%) - 3 (20.0%) 2 (4.3%) 2 (4.2%)   Abdominal pain 3 (2.9%) 2 (1.9%) 1 (1.0%) - 3 (20.0%) - -  Elevated ALT 7 (6.7%)   4 (3.8%)   3 (2.9%)   3 (2.9%) 2 (13.3%)  4 (8.5%)   1 (2.1%)   Elevated AST 6 (5.7%)   2 (1.9%)   4 (3.8%)   3 (2.9%) 1 (6.7%)  4 (8.5%)  1 (2.1%)   Headache 5 (4.8%) 5 (4.8%) - - - - 5 (10.4%)    Xxxxx  Called 12/08/2022 5:04 PM - 10 mi earlier Ryan [name] with Oct 16, 1945 - on his cell phone. Said last vomotting was 12/07/22 in morning. No diarrhea. No abd pain. No fever. Just feels exhausted. Not eating. Some nausea and controlled with generic zofran. Says he is better. Says he did NOT want admission. Advised him to come 12/09/22 for eval. He said he might not feel like coming. Then spoke to son who affirmed patient fatigue. Stringly advised him to come to office for evaluation. Offered research Benedetto Goad transport but son Ryan [name] says he will bring patient (dad) to [facility] at 11.30am. D.w [facility] Leigh Ann Pool. Will do unscheduled visit and do exam and get orthostats. Wil send off research labs + do concomitant standard of care labs   Plan  - for pulm triage -standard of care labs: please put order for 12/09/22  cbc with diff, bmet, mag, phos, lactate, trop, LFT and lipase. These should STAT     SIGNATURE    Dr. Kalman Shan, M.D., F.C.C.P, ACRP-CPI Pulmonary and Critical  Care Medicine Research Investigator, [facility] @ [facility] Health Staff Physician, [facility] Center Director - Interstitial Lung Disease  Program  Pulmonary Fibrosis [facility] [place] Pulmonary and [facility] @ [facility] [place], [place], [address]   Pager: [phone], If no answer  OR between  19:00-7:00h: page [phone] Telephone (research): [phone]  5:08 PM 12/08/2022   5:08 PM 12/08/2022

## 2022-12-09 ENCOUNTER — Encounter: Payer: PPO | Admitting: Internal Medicine

## 2022-12-09 ENCOUNTER — Other Ambulatory Visit: Payer: Self-pay

## 2022-12-09 ENCOUNTER — Inpatient Hospital Stay (HOSPITAL_COMMUNITY)
Admission: EM | Admit: 2022-12-09 | Discharge: 2022-12-11 | DRG: 638 | Disposition: A | Discharge status: Home / Self Care | Payer: PPO | Attending: Internal Medicine | Admitting: Internal Medicine

## 2022-12-09 ENCOUNTER — Encounter (HOSPITAL_COMMUNITY): Payer: Self-pay | Admitting: Internal Medicine

## 2022-12-09 ENCOUNTER — Emergency Department (HOSPITAL_COMMUNITY): Payer: PPO

## 2022-12-09 DIAGNOSIS — I480 Paroxysmal atrial fibrillation: Secondary | ICD-10-CM | POA: Diagnosis present

## 2022-12-09 DIAGNOSIS — I10 Essential (primary) hypertension: Secondary | ICD-10-CM | POA: Diagnosis present

## 2022-12-09 DIAGNOSIS — Z888 Allergy status to other drugs, medicaments and biological substances status: Secondary | ICD-10-CM

## 2022-12-09 DIAGNOSIS — R7889 Finding of other specified substances, not normally found in blood: Secondary | ICD-10-CM

## 2022-12-09 DIAGNOSIS — I5032 Chronic diastolic (congestive) heart failure: Secondary | ICD-10-CM | POA: Diagnosis present

## 2022-12-09 DIAGNOSIS — Z8249 Family history of ischemic heart disease and other diseases of the circulatory system: Secondary | ICD-10-CM

## 2022-12-09 DIAGNOSIS — Z7901 Long term (current) use of anticoagulants: Secondary | ICD-10-CM

## 2022-12-09 DIAGNOSIS — E785 Hyperlipidemia, unspecified: Secondary | ICD-10-CM | POA: Diagnosis present

## 2022-12-09 DIAGNOSIS — I2510 Atherosclerotic heart disease of native coronary artery without angina pectoris: Secondary | ICD-10-CM | POA: Diagnosis present

## 2022-12-09 DIAGNOSIS — R112 Nausea with vomiting, unspecified: Secondary | ICD-10-CM

## 2022-12-09 DIAGNOSIS — Z833 Family history of diabetes mellitus: Secondary | ICD-10-CM

## 2022-12-09 DIAGNOSIS — K219 Gastro-esophageal reflux disease without esophagitis: Secondary | ICD-10-CM | POA: Diagnosis present

## 2022-12-09 DIAGNOSIS — K529 Noninfective gastroenteritis and colitis, unspecified: Secondary | ICD-10-CM | POA: Diagnosis present

## 2022-12-09 DIAGNOSIS — Z83438 Family history of other disorder of lipoprotein metabolism and other lipidemia: Secondary | ICD-10-CM

## 2022-12-09 DIAGNOSIS — I252 Old myocardial infarction: Secondary | ICD-10-CM

## 2022-12-09 DIAGNOSIS — E860 Dehydration: Secondary | ICD-10-CM | POA: Diagnosis present

## 2022-12-09 DIAGNOSIS — J84112 Idiopathic pulmonary fibrosis: Secondary | ICD-10-CM | POA: Diagnosis present

## 2022-12-09 DIAGNOSIS — Z87891 Personal history of nicotine dependence: Secondary | ICD-10-CM

## 2022-12-09 DIAGNOSIS — T730XXA Starvation, initial encounter: Secondary | ICD-10-CM | POA: Diagnosis present

## 2022-12-09 DIAGNOSIS — Z7983 Long term (current) use of bisphosphonates: Secondary | ICD-10-CM

## 2022-12-09 DIAGNOSIS — E8729 Other acidosis: Secondary | ICD-10-CM | POA: Diagnosis present

## 2022-12-09 DIAGNOSIS — E876 Hypokalemia: Secondary | ICD-10-CM | POA: Diagnosis present

## 2022-12-09 DIAGNOSIS — I110 Hypertensive heart disease with heart failure: Secondary | ICD-10-CM | POA: Diagnosis present

## 2022-12-09 DIAGNOSIS — E1110 Type 2 diabetes mellitus with ketoacidosis without coma: Principal | ICD-10-CM | POA: Diagnosis present

## 2022-12-09 DIAGNOSIS — R9431 Abnormal electrocardiogram [ECG] [EKG]: Secondary | ICD-10-CM | POA: Diagnosis present

## 2022-12-09 DIAGNOSIS — M810 Age-related osteoporosis without current pathological fracture: Secondary | ICD-10-CM | POA: Diagnosis present

## 2022-12-09 DIAGNOSIS — K7460 Unspecified cirrhosis of liver: Secondary | ICD-10-CM | POA: Diagnosis present

## 2022-12-09 DIAGNOSIS — R197 Diarrhea, unspecified: Secondary | ICD-10-CM

## 2022-12-09 DIAGNOSIS — E872 Acidosis, unspecified: Secondary | ICD-10-CM | POA: Diagnosis present

## 2022-12-09 DIAGNOSIS — R059 Cough, unspecified: Secondary | ICD-10-CM | POA: Diagnosis present

## 2022-12-09 DIAGNOSIS — Z79899 Other long term (current) drug therapy: Secondary | ICD-10-CM

## 2022-12-09 DIAGNOSIS — Z794 Long term (current) use of insulin: Secondary | ICD-10-CM

## 2022-12-09 HISTORY — DX: Type 2 diabetes mellitus with ketoacidosis without coma: E11.10

## 2022-12-09 LAB — GLUCOSE, CAPILLARY
Glucose-Capillary: 228 mg/dL — ABNORMAL HIGH (ref 70–99)
Glucose-Capillary: 201 mg/dL — ABNORMAL HIGH (ref 70–99)
Glucose-Capillary: 149 mg/dL — ABNORMAL HIGH (ref 70–99)
Glucose-Capillary: 128 mg/dL — ABNORMAL HIGH (ref 70–99)
Glucose-Capillary: 157 mg/dL — ABNORMAL HIGH (ref 70–99)
Glucose-Capillary: 171 mg/dL — ABNORMAL HIGH (ref 70–99)
Glucose-Capillary: 178 mg/dL — ABNORMAL HIGH (ref 70–99)
Glucose-Capillary: 162 mg/dL — ABNORMAL HIGH (ref 70–99)

## 2022-12-09 LAB — BASIC METABOLIC PANEL
Anion gap: 16 — ABNORMAL HIGH (ref 5–15)
Anion gap: 13 (ref 5–15)
Anion gap: 11 (ref 5–15)
BUN: 19 mg/dL (ref 8–23)
BUN: 16 mg/dL (ref 8–23)
BUN: 14 mg/dL (ref 8–23)
CO2: 15 mmol/L — ABNORMAL LOW (ref 22–32)
CO2: 15 mmol/L — ABNORMAL LOW (ref 22–32)
CO2: 16 mmol/L — ABNORMAL LOW (ref 22–32)
Calcium: 7.8 mg/dL — ABNORMAL LOW (ref 8.9–10.3)
Calcium: 7.6 mg/dL — ABNORMAL LOW (ref 8.9–10.3)
Calcium: 7.4 mg/dL — ABNORMAL LOW (ref 8.9–10.3)
Chloride: 105 mmol/L (ref 98–111)
Chloride: 108 mmol/L (ref 98–111)
Chloride: 106 mmol/L (ref 98–111)
Creatinine, Ser: 0.8 mg/dL (ref 0.61–1.24)
Creatinine, Ser: 0.8 mg/dL (ref 0.61–1.24)
Creatinine, Ser: 0.74 mg/dL (ref 0.61–1.24)
GFR, Estimated: >60 mL/min (ref >60)
GFR, Estimated: >60 mL/min (ref >60)
GFR, Estimated: >60 mL/min (ref >60)
Glucose, Bld: 201 mg/dL — ABNORMAL HIGH (ref 70–99)
Glucose, Bld: 160 mg/dL — ABNORMAL HIGH (ref 70–99)
Glucose, Bld: 170 mg/dL — ABNORMAL HIGH (ref 70–99)
Potassium: 3.8 mmol/L (ref 3.5–5.1)
Potassium: 3.6 mmol/L (ref 3.5–5.1)
Potassium: 3.9 mmol/L (ref 3.5–5.1)
Sodium: 136 mmol/L (ref 135–145)
Sodium: 136 mmol/L (ref 135–145)
Sodium: 133 mmol/L — ABNORMAL LOW (ref 135–145)

## 2022-12-09 LAB — RAPID URINE DRUG SCREEN, HOSP PERFORMED
Amphetamines: NOT DETECTED
Barbiturates: NOT DETECTED
Benzodiazepines: NOT DETECTED
Cocaine: NOT DETECTED
Opiates: NOT DETECTED
Tetrahydrocannabinol: NOT DETECTED

## 2022-12-09 LAB — COMPREHENSIVE METABOLIC PANEL
ALT: 21 U/L (ref 0–44)
AST: 29 U/L (ref 15–41)
Albumin: 3.4 g/dL — ABNORMAL LOW (ref 3.5–5.0)
Alkaline Phosphatase: 69 U/L (ref 38–126)
Anion gap: 19 — ABNORMAL HIGH (ref 5–15)
BUN: 23 mg/dL (ref 8–23)
CO2: 15 mmol/L — ABNORMAL LOW (ref 22–32)
Calcium: 8.5 mg/dL — ABNORMAL LOW (ref 8.9–10.3)
Chloride: 100 mmol/L (ref 98–111)
Creatinine, Ser: 1.15 mg/dL (ref 0.61–1.24)
GFR, Estimated: >60 mL/min (ref >60)
Glucose, Bld: 264 mg/dL — ABNORMAL HIGH (ref 70–99)
Potassium: 3.7 mmol/L (ref 3.5–5.1)
Sodium: 134 mmol/L — ABNORMAL LOW (ref 135–145)
Total Bilirubin: 2 mg/dL — ABNORMAL HIGH (ref 0.3–1.2)
Total Protein: 7.5 g/dL (ref 6.5–8.1)

## 2022-12-09 LAB — CBC
HCT: 43.9 % (ref 39.0–52.0)
Hemoglobin: 14.2 g/dL (ref 13.0–17.0)
MCH: 29.9 pg (ref 26.0–34.0)
MCHC: 32.3 g/dL (ref 30.0–36.0)
MCV: 92.4 fL (ref 80.0–100.0)
Platelets: 327 10*3/uL (ref 150–400)
RBC: 4.75 MIL/uL (ref 4.22–5.81)
RDW: 13.7 % (ref 11.5–15.5)
WBC: 12.7 10*3/uL — ABNORMAL HIGH (ref 4.0–10.5)
nRBC: 0 % (ref 0.0–0.2)

## 2022-12-09 LAB — URINALYSIS, ROUTINE W REFLEX MICROSCOPIC
Bilirubin Urine: NEGATIVE
Glucose, UA: >=500 mg/dL — AB
Ketones, ur: 80 mg/dL — AB
Leukocytes,Ua: NEGATIVE
Nitrite: NEGATIVE
Protein, ur: 30 mg/dL — AB
Specific Gravity, Urine: 1.031 — ABNORMAL HIGH (ref 1.005–1.030)
pH: 5 (ref 5.0–8.0)

## 2022-12-09 LAB — BLOOD GAS, VENOUS
Acid-base deficit: 6.8 mmol/L — ABNORMAL HIGH (ref 0.0–2.0)
Bicarbonate: 17.7 mmol/L — ABNORMAL LOW (ref 20.0–28.0)
O2 Saturation: 59.2 %
Patient temperature: 37
pCO2, Ven: 32 mmHg — ABNORMAL LOW (ref 44–60)
pH, Ven: 7.35 (ref 7.25–7.43)
pO2, Ven: 31 mmHg — CL (ref 32–45)

## 2022-12-09 LAB — MAGNESIUM: Magnesium: 2.4 mg/dL (ref 1.7–2.4)

## 2022-12-09 LAB — LACTIC ACID, PLASMA: Lactic Acid, Venous: 1.7 mmol/L (ref 0.5–1.9)

## 2022-12-09 LAB — TROPONIN I (HIGH SENSITIVITY)
Troponin I (High Sensitivity): 26 ng/L — ABNORMAL HIGH (ref <18)
Troponin I (High Sensitivity): 22 ng/L — ABNORMAL HIGH (ref <18)

## 2022-12-09 LAB — CBG MONITORING, ED
Glucose-Capillary: 264 mg/dL — ABNORMAL HIGH (ref 70–99)
Glucose-Capillary: 188 mg/dL — ABNORMAL HIGH (ref 70–99)

## 2022-12-09 LAB — CULTURE, BLOOD (ROUTINE X 2): Special Requests: ADEQUATE

## 2022-12-09 LAB — LIPASE, BLOOD: Lipase: 41 U/L (ref 11–51)

## 2022-12-09 LAB — BETA-HYDROXYBUTYRIC ACID
Beta-Hydroxybutyric Acid: 6.72 mmol/L — ABNORMAL HIGH (ref 0.05–0.27)
Beta-Hydroxybutyric Acid: 5.83 mmol/L — ABNORMAL HIGH (ref 0.05–0.27)
Beta-Hydroxybutyric Acid: 3.13 mmol/L — ABNORMAL HIGH (ref 0.05–0.27)

## 2022-12-09 LAB — PHOSPHORUS: Phosphorus: 1.4 mg/dL — ABNORMAL LOW (ref 2.5–4.6)

## 2022-12-09 MED ORDER — MAGNESIUM SULFATE 2 GM/50ML IV SOLN
2.0000 g | Freq: Once | INTRAVENOUS | Status: AC
  Administered 2022-12-09: 2 g via INTRAVENOUS
  Filled 2022-12-09: qty 50

## 2022-12-09 MED ORDER — PROCHLORPERAZINE EDISYLATE 10 MG/2ML IJ SOLN: 10.0000 mg | Freq: Four times a day (QID) | INTRAMUSCULAR | Status: DC | PRN

## 2022-12-09 MED ORDER — POTASSIUM CHLORIDE 10 MEQ/100ML IV SOLN
10.0000 meq | INTRAVENOUS | Status: AC
  Administered 2022-12-09 (×2): 10 meq via INTRAVENOUS
  Filled 2022-12-09 (×2): qty 100

## 2022-12-09 MED ORDER — ONDANSETRON HCL 4 MG PO TABS
4.0000 mg | ORAL_TABLET | Freq: Four times a day (QID) | ORAL | Status: DC | PRN
Start: 2022-12-09 — End: 2022-12-09

## 2022-12-09 MED ORDER — DEXTROSE IN LACTATED RINGERS 5 % IV SOLN: INTRAVENOUS | Status: DC

## 2022-12-09 MED ORDER — LACTATED RINGERS IV SOLN: INTRAVENOUS | Status: DC

## 2022-12-09 MED ORDER — IOHEXOL 300 MG/ML  SOLN
100.0000 mL | Freq: Once | INTRAMUSCULAR | Status: AC | PRN
  Administered 2022-12-09: 100 mL via INTRAVENOUS

## 2022-12-09 MED ORDER — ONDANSETRON HCL 4 MG/2ML IJ SOLN
4.0000 mg | Freq: Once | INTRAMUSCULAR | Status: AC
  Administered 2022-12-09: 4 mg via INTRAVENOUS
  Filled 2022-12-09: qty 2

## 2022-12-09 MED ORDER — PANTOPRAZOLE SODIUM 40 MG IV SOLR
40.0000 mg | Freq: Every day | INTRAVENOUS | Status: DC
  Administered 2022-12-09 – 2022-12-11 (×3): 40 mg via INTRAVENOUS
  Filled 2022-12-09 (×3): qty 10

## 2022-12-09 MED ORDER — CHLORHEXIDINE GLUCONATE CLOTH 2 % EX PADS
6.0000 | MEDICATED_PAD | Freq: Every day | CUTANEOUS | Status: DC
  Administered 2022-12-10: 6 via TOPICAL

## 2022-12-09 MED ORDER — ACETAMINOPHEN 650 MG RE SUPP: 650.0000 mg | Freq: Four times a day (QID) | RECTAL | Status: DC | PRN

## 2022-12-09 MED ORDER — SODIUM CHLORIDE 0.9 % IV SOLN
2.0000 g | INTRAVENOUS | Status: DC
  Administered 2022-12-10 – 2022-12-11 (×2): 2 g via INTRAVENOUS
  Filled 2022-12-09 (×2): qty 20

## 2022-12-09 MED ORDER — ENOXAPARIN SODIUM 40 MG/0.4ML IJ SOSY
40.0000 mg | PREFILLED_SYRINGE | INTRAMUSCULAR | Status: DC
  Administered 2022-12-09: 40 mg via SUBCUTANEOUS
  Filled 2022-12-09: qty 0.4

## 2022-12-09 MED ORDER — ACETAMINOPHEN 325 MG PO TABS
650.0000 mg | ORAL_TABLET | Freq: Four times a day (QID) | ORAL | Status: DC | PRN
  Administered 2022-12-10: 650 mg via ORAL
  Filled 2022-12-09: qty 2

## 2022-12-09 MED ORDER — METRONIDAZOLE 500 MG/100ML IV SOLN
500.0000 mg | Freq: Once | INTRAVENOUS | Status: AC
  Administered 2022-12-09: 500 mg via INTRAVENOUS
  Filled 2022-12-09: qty 100

## 2022-12-09 MED ORDER — INSULIN REGULAR(HUMAN) IN NACL 100-0.9 UT/100ML-% IV SOLN
INTRAVENOUS | Status: DC
  Administered 2022-12-09: 4.6 [IU]/h via INTRAVENOUS
  Filled 2022-12-09: qty 100

## 2022-12-09 MED ORDER — SODIUM CHLORIDE (PF) 0.9 % IJ SOLN
INTRAMUSCULAR | Status: AC
  Filled 2022-12-09: qty 50

## 2022-12-09 MED ORDER — DEXTROSE 50 % IV SOLN: 0.0000 mL | INTRAVENOUS | Status: DC | PRN

## 2022-12-09 MED ORDER — ONDANSETRON HCL 4 MG/2ML IJ SOLN: 4.0000 mg | Freq: Four times a day (QID) | INTRAMUSCULAR | Status: DC | PRN

## 2022-12-09 MED ORDER — SODIUM CHLORIDE 0.9 % IV SOLN
2.0000 g | Freq: Once | INTRAVENOUS | Status: AC
  Administered 2022-12-09: 2 g via INTRAVENOUS
  Filled 2022-12-09: qty 20

## 2022-12-09 MED ORDER — METRONIDAZOLE 500 MG/100ML IV SOLN
500.0000 mg | Freq: Two times a day (BID) | INTRAVENOUS | Status: DC
  Administered 2022-12-10 – 2022-12-11 (×2): 500 mg via INTRAVENOUS
  Filled 2022-12-09 (×2): qty 100

## 2022-12-09 MED ORDER — SODIUM CHLORIDE 0.9 % IV BOLUS
2000.0000 mL | Freq: Once | INTRAVENOUS | Status: AC
  Administered 2022-12-09: 2000 mL via INTRAVENOUS

## 2022-12-09 MED ORDER — POTASSIUM PHOSPHATES 15 MMOLE/5ML IV SOLN
30.0000 mmol | Freq: Once | INTRAVENOUS | Status: AC
  Administered 2022-12-09: 30 mmol via INTRAVENOUS
  Filled 2022-12-09 (×2): qty 10

## 2022-12-09 NOTE — ED Triage Notes (Signed)
Pt BIBA from home for NVD x11 days, known side effects from clinical trial meds. Took nausea meds around 0600. Denies urinary sx, denies blood in emesis or diarrhea.  164/84 HR 74 99% RA CBG 306

## 2022-12-09 NOTE — ED Provider Notes (Signed)
[place] EMERGENCY DEPARTMENT AT [facility] Provider Note   CSN: [id number] Arrival date & time: 12/09/22  4098     History  Chief Complaint  Patient presents with   Emesis    Ryan [name] is a 77 y.o. male history of diabetes, hypertension, CHF, pulmonary fibrosis, cirrhosis here for evaluation of persistent nausea vomiting diarrhea.  Symptoms over the last 11 days.  Recently started experimental medication for pulmonary fibrosis.  Was seen in emergency department a few days ago was noted to have some elevated labs.  Declined admission at that time.  Still despite OTC medications still having persistent nausea vomiting and diarrhea.  No bloody stool.  Feels dehydrated.  No recent falls however feels generally weak.  Generalized abdominal cramping.  No recent antibiotics, travel.  States blood sugars at home have been "up-and-down."  He stopped taking his experimental medication 2 days ago in attempt to help with nausea and vomiting however still having symptoms  Discussed with his pulmonologist, Dr. Marchelle Gearing who recommended coming to emergency department for further evaluation.  HPI     Home Medications Prior to Admission medications   Medication Sig Start Date End Date Taking? Authorizing Provider  alendronate (FOSAMAX) 70 MG tablet Take 1 tablet (70 mg total) by mouth every 7 (seven) days. Take with a full glass of water on an empty stomach. 09/09/22   Sandford Craze, NP  apixaban (ELIQUIS) 5 MG TABS tablet Take 1 tablet (5 mg total) by mouth 2 (two) times daily. 11/10/22   Pricilla Riffle, MD  atorvastatin (LIPITOR) 40 MG tablet TAKE 1 TABLET (40 MG TOTAL) BY MOUTH DAILY. 09/01/22   Sandford Craze, NP  blood glucose meter kit and supplies by Other route See admin instructions. Check blood sugar 6-7 times daily    [provider]  Calcium Carbonate-Vitamin D 600-400 MG-UNIT tablet Take 1 tablet by mouth 2 (two) times daily. Reported on 01/27/2016     [provider]  cholecalciferol (VITAMIN D3) 25 MCG (1000 UNIT) tablet Take 1,000 Units by mouth daily.    [provider]  Glucagon, rDNA, (GLUCAGON EMERGENCY) 1 MG KIT Inject 1 mg into the muscle once as needed for up to 1 dose. Patient taking differently: Inject 1 mg into the muscle as needed (blood sugar below 45). 02/26/21   Carlus Pavlov, MD  insulin aspart (NOVOLOG) 100 UNIT/ML injection Inject 2-3 Units into the skin 3 (three) times daily before meals. 05/28/22   Carlus Pavlov, MD  insulin degludec (TRESIBA FLEXTOUCH) 100 UNIT/ML FlexTouch Pen Inject 8 Units into the skin daily. Will start in 2024 after he completes current supply of Levemir. 07/13/22   [name], [name], RPH-CPP  insulin detemir (LEVEMIR) 100 UNIT/ML injection INJECT 0.8 ML (8 UNITS) INTO THE SKIN AT BEDTIME 05/28/22   Carlus Pavlov, MD  Insulin Syringe-Needle U-100 (TRUEPLUS INSULIN SYRINGE) 31G X 5/16" 0.5 ML MISC USE TO INJECT INSULIN 4 TIMES A DAY 03/18/21   Carlus Pavlov, MD  metoprolol succinate (TOPROL-XL) 25 MG 24 hr tablet TAKE 1 TABLET (25 MG TOTAL) BY MOUTH DAILY. 09/01/22   Sandford Craze, NP  ondansetron (ZOFRAN-ODT) 4 MG disintegrating tablet Take 1 tablet (4 mg total) by mouth every 8 (eight) hours as needed for nausea or vomiting. 12/07/22   Tegeler, Canary Brim, MD      Allergies    Other and Lisinopril    Review of Systems   Review of Systems  Constitutional:  Positive for fatigue.  HENT: Negative.  Respiratory: Negative.    Cardiovascular: Negative.   Gastrointestinal:  Positive for abdominal pain, diarrhea, nausea and vomiting.  Genitourinary: Negative.   Musculoskeletal: Negative.   Skin: Negative.   Neurological:  Positive for weakness.  All other systems reviewed and are negative.   Physical Exam Updated Vital Signs BP (!) 144/73   Pulse 69   Temp 98 F (36.7 C) (Oral)   Resp 19   SpO2 100%  Physical Exam Vitals and nursing note reviewed.   Constitutional:      General: He is not in acute distress.    Appearance: He is well-developed. He is not ill-appearing, toxic-appearing or diaphoretic.  HENT:     Head: Normocephalic and atraumatic.     Jaw: There is normal jaw occlusion.     Mouth/Throat:     Lips: Pink.     Mouth: Mucous membranes are dry.     Pharynx: Oropharynx is clear. Uvula midline.     Comments: Dry mucous membranes, cracked lips Eyes:     Pupils: Pupils are equal, round, and reactive to light.  Cardiovascular:     Rate and Rhythm: Normal rate and regular rhythm.     Pulses: Normal pulses.          Radial pulses are 2+ on the right side and 2+ on the left side.       Dorsalis pedis pulses are 2+ on the right side and 2+ on the left side.     Heart sounds: Normal heart sounds.  Pulmonary:     Effort: Pulmonary effort is normal. No respiratory distress.     Breath sounds: Normal breath sounds and air entry.  Abdominal:     General: Bowel sounds are normal. There is no distension.     Palpations: Abdomen is soft.     Tenderness: There is generalized abdominal tenderness.     Comments: generalized tenderness, no rebound or guarding  Musculoskeletal:        General: No swelling or tenderness. Normal range of motion.     Cervical back: Normal range of motion and neck supple.     Right lower leg: No edema.     Left lower leg: No edema.     Comments: No bony tenderness, full ROM  Skin:    General: Skin is warm and dry.     Capillary Refill: Capillary refill takes 2 to 3 seconds.  Neurological:     General: No focal deficit present.     Mental Status: He is alert and oriented to person, place, and time.     ED Results / Procedures / Treatments   Labs (all labs ordered are listed, but only abnormal results are displayed) Labs Reviewed  COMPREHENSIVE METABOLIC PANEL - Abnormal; Notable for the following components:      Result Value   Sodium 134 (*)    CO2 15 (*)    Glucose, Bld 264 (*)    Calcium  8.5 (*)    Albumin 3.4 (*)    Total Bilirubin 2.0 (*)    Anion gap 19 (*)    All other components within normal limits  CBC - Abnormal; Notable for the following components:   WBC 12.7 (*)    All other components within normal limits  BETA-HYDROXYBUTYRIC ACID - Abnormal; Notable for the following components:   Beta-Hydroxybutyric Acid 6.72 (*)    All other components within normal limits  PHOSPHORUS - Abnormal; Notable for the following components:   Phosphorus 1.4 (*)  All other components within normal limits  BLOOD GAS, VENOUS - Abnormal; Notable for the following components:   pCO2, Ven 32 (*)    pO2, Ven 31 (*)    Bicarbonate 17.7 (*)    Acid-base deficit 6.8 (*)    All other components within normal limits  CBG MONITORING, ED - Abnormal; Notable for the following components:   Glucose-Capillary 264 (*)    All other components within normal limits  CBG MONITORING, ED - Abnormal; Notable for the following components:   Glucose-Capillary 188 (*)    All other components within normal limits  TROPONIN I (HIGH SENSITIVITY) - Abnormal; Notable for the following components:   Troponin I (High Sensitivity) 26 (*)    All other components within normal limits  TROPONIN I (HIGH SENSITIVITY) - Abnormal; Notable for the following components:   Troponin I (High Sensitivity) 22 (*)    All other components within normal limits  GASTROINTESTINAL PANEL BY PCR, STOOL (REPLACES STOOL CULTURE)  C DIFFICILE QUICK SCREEN W PCR REFLEX    LIPASE, BLOOD  LACTIC ACID, PLASMA  MAGNESIUM  RAPID URINE DRUG SCREEN, HOSP PERFORMED  URINALYSIS, ROUTINE W REFLEX MICROSCOPIC  BASIC METABOLIC PANEL  BASIC METABOLIC PANEL  BASIC METABOLIC PANEL  BASIC METABOLIC PANEL  BETA-HYDROXYBUTYRIC ACID  BETA-HYDROXYBUTYRIC ACID    EKG None  Radiology CT ABDOMEN PELVIS W CONTRAST  Result Date: 12/09/2022 CLINICAL DATA:  Abdominal pain, nausea, vomiting and diarrhea. EXAM: CT ABDOMEN AND PELVIS WITH  CONTRAST TECHNIQUE: Multidetector CT imaging of the abdomen and pelvis was performed using the standard protocol following bolus administration of intravenous contrast. RADIATION DOSE REDUCTION: This exam was performed according to the departmental dose-optimization program which includes automated exposure control, adjustment of the mA and/or kV according to patient size and/or use of iterative reconstruction technique. CONTRAST:  OMNIPAQUE IOHEXOL 300 MG/ML  SOLN COMPARISON:  CT of the chest on 11/29/2022 FINDINGS: Lower chest: Stable appearance of chronic pulmonary fibrosis affecting the peripheral lung zones at the visualized lung bases. Hepatobiliary: Scattered calcified granulomata in the liver. No solid hepatic masses. No gallstones, gallbladder wall thickening, or biliary dilatation. Pancreas: Unremarkable. No pancreatic ductal dilatation or surrounding inflammatory changes. Spleen: Multiple punctate calcified granulomata within a normal-sized spleen. Adrenals/Urinary Tract: Adrenal glands are unremarkable. Kidneys are normal, without renal calculi, focal lesion, or hydronephrosis. Bladder is unremarkable. Stomach/Bowel: There is some circumferential wall thickening of a decompressed cecum, ascending colon and hepatic flexure. Some wall thickening may also involve the terminal ileum. Findings are suggestive of segmental colitis and also potentially ileitis of the terminal ileum. No associated evidence to suggest bowel perforation, obstruction or focal abscess. No free intraperitoneal air. Diverticulosis of the sigmoid and descending colon without evidence of diverticulitis. Vascular/Lymphatic: Atherosclerosis of the abdominal aorta and iliac arteries without evidence of aneurysm. No lymphadenopathy identified in the abdomen or pelvis. Reproductive: Prostate is unremarkable. Other: No abdominal wall hernia or abnormality. No abdominopelvic ascites. Musculoskeletal: Lumbar degenerative disc disease, most  prominently at L5-S1. IMPRESSION: 1. Circumferential wall thickening of a decompressed cecum, ascending colon and hepatic flexure. Some wall thickening may also involve the terminal ileum. Findings are suggestive of segmental colitis and also potentially ileitis of the terminal ileum. No evidence to suggest bowel perforation, obstruction or focal abscess. 2. Stable appearance of chronic pulmonary fibrosis at the visualized lung bases. 3. Evidence of prior granulomatous disease with calcified granulomata in the liver and spleen. 4. Atherosclerosis of the abdominal aorta and iliac arteries without evidence of aneurysm.  Aortic Atherosclerosis (ICD10-I70.0). Electronically Signed   By: Irish Lack M.D.   On: 12/09/2022 11:11   DG Chest Portable 1 View  Result Date: 12/07/2022 CLINICAL DATA:  Cough, nausea, vomiting, diarrhea, and fatigue. EXAM: PORTABLE CHEST 1 VIEW COMPARISON:  Chest radiographs 10/27/2021 and CT 11/29/2022 FINDINGS: The cardiomediastinal silhouette is unchanged with normal heart size. Lung volumes are lower than on the prior radiographs, and there is slight elevation of the right hemidiaphragm. Coarse interstitial densities in both lungs are similar to the prior radiographs. No acute airspace consolidation, sizeable pleural effusion, or pneumothorax is identified. No acute osseous abnormality is seen. IMPRESSION: Chronic interstitial lung disease without evidence of acute cardiopulmonary process. Electronically Signed   By: Sebastian Ache M.D.   On: 12/07/2022 19:09   CT Head Wo Contrast  Result Date: 12/07/2022 CLINICAL DATA:  Head trauma. Fall 1 week ago. Nausea and vomiting and headache. EXAM: CT HEAD WITHOUT CONTRAST TECHNIQUE: Contiguous axial images were obtained from the base of the skull through the vertex without intravenous contrast. RADIATION DOSE REDUCTION: This exam was performed according to the departmental dose-optimization program which includes automated exposure control,  adjustment of the mA and/or kV according to patient size and/or use of iterative reconstruction technique. COMPARISON:  Head CT 08/06/2020 FINDINGS: Brain: There is no evidence of an acute infarct, intracranial hemorrhage, mass, midline shift, or extra-axial fluid collection. The ventricles and sulci are within normal limits for age. Periventricular white matter hypodensities are nonspecific but compatible with minimal chronic small vessel ischemic disease. Vascular: Calcified atherosclerosis at the skull base. No hyperdense vessel. Skull: No acute fracture or suspicious osseous lesion. Sinuses/Orbits: Minimal fluid in the right sphenoid sinus. Mild chronic left ethmoid air cell opacification. Clear mastoid air cells. Bilateral cataract extraction. Other: None. IMPRESSION: No evidence of acute intracranial abnormality. Electronically Signed   By: Sebastian Ache M.D.   On: 12/07/2022 19:06    Procedures .Critical Care  Performed by: Linwood Dibbles, PA-C Authorized by: Linwood Dibbles, PA-C   Critical care provider statement:    Critical care time (minutes):  35   Critical care was necessary to treat or prevent imminent or life-threatening deterioration of the following conditions:  Dehydration   Critical care was time spent personally by me on the following activities:  Development of treatment plan with patient or surrogate, discussions with consultants, evaluation of patient's response to treatment, examination of patient, ordering and review of laboratory studies, ordering and review of radiographic studies, ordering and performing treatments and interventions, pulse oximetry, re-evaluation of patient's condition and review of old charts     Medications Ordered in ED Medications  cefTRIAXone (ROCEPHIN) 2 g in sodium chloride 0.9 % 100 mL IVPB (0 g Intravenous Stopped 12/09/22 1237)    And  metroNIDAZOLE (FLAGYL) IVPB 500 mg (500 mg Intravenous New Bag/Given 12/09/22 1237)  lactated ringers  infusion (0 mLs Intravenous Stopped 12/09/22 1251)  potassium PHOSPHATE 30 mmol in dextrose 5 % 500 mL infusion (has no administration in time range)  insulin regular, human (MYXREDLIN) 100 units/ 100 mL infusion (has no administration in time range)  lactated ringers infusion ( Intravenous Restarted 12/09/22 1251)  dextrose 5 % in lactated ringers infusion ( Intravenous Not Given 12/09/22 1305)  dextrose 50 % solution 0-50 mL (has no administration in time range)  potassium chloride 10 mEq in 100 mL IVPB (has no administration in time range)  sodium chloride 0.9 % bolus 2,000 mL (0 mLs Intravenous Stopped 12/09/22 1035)  iohexol (OMNIPAQUE) 300 MG/ML solution 100 mL (100 mLs Intravenous Contrast Given 12/09/22 1033)  ondansetron (ZOFRAN) injection 4 mg (4 mg Intravenous Given 12/09/22 1209)    ED Course/ Medical Decision Making/ A&P   77 year old returns today for persistent nausea, vomiting and diarrhea.  Seen for similar few days ago was offered admission however it appears he declined at that time.  Symptoms initially thought due to experimental medication for pulmonary fibrosis however upon stopping medication he still persistently has symptoms.  On exam patient afebrile however appears very dehydrated, dry mucous membranes.  Heart and lungs clear.  Abdomen soft however generalized tenderness.  He admits to multiple episodes of loose stool however no blood.  No recent antibiotics or travel.  Will plan on labs, imaging and reassess  Labs and imaging personally viewed interpreted CBC leukocytosis 12.7, down from prior visit Metabolic panel CO2 15, T. bili 2.0, anion gap 19 Lipase 41 VBG pH 7.3, bicarb 17 Beta hydroxy 6.72, up from prior visit Trop 22, lactic acid 1.7 CT abdomen and pelvis with colitis  Patient reassessed.  Prior echo EF 60%>>> patient given 2 L IV fluid for bolus as well as continuous fluids.  He feels improved however given exam will admit for further management.  Antibiotic  started for colitis.  He is pending stool studies.  While he does have mild hyperglycemia, low bicarb will discuss with hospitalist prior to starting patient on insulin drip as I suspect a lot of this is likely related to dehydration  Reassessed.  Symptoms improved however still appears grossly clinically dehydrated.  Will admit for further management.  He does have evidence of dehydration acidosis.  Continuous IV fluid ordered in addition to his bolus.  Low suspicion for sepsis, DKA.  CONSULT with Dr. Robb Matar with [facility] who is agreeable to evaluate patient for admission  The patient appears reasonably stabilized for admission considering the current resources, flow, and capabilities available in the ED at this time, and I doubt any other [facility] requiring further screening and/or treatment in the ED prior to admission.                             Medical Decision Making Amount and/or Complexity of Data Reviewed Independent Historian: friend External Data Reviewed: labs, radiology, ECG and notes. Labs: ordered. Decision-making details documented in ED Course. Radiology: ordered and independent interpretation performed. Decision-making details documented in ED Course. ECG/medicine tests: ordered and independent interpretation performed. Decision-making details documented in ED Course.  Risk OTC drugs. Prescription drug management. Parenteral controlled substances. Decision regarding hospitalization. Diagnosis or treatment significantly limited by social determinants of health.          Final Clinical Impression(s) / ED Diagnoses Final diagnoses:  Dehydration  Nausea vomiting and diarrhea  Colitis  Metabolic acidosis  Elevated beta-hydroxybutyric acid level    Rx / DC Orders ED Discharge Orders     None         Hope Holst A, PA-C 12/09/22 1326    Benjiman Core, MD 12/11/22 [phone]

## 2022-12-09 NOTE — Telephone Encounter (Signed)
Thanks. I am aweare

## 2022-12-09 NOTE — H&P (Signed)
History and Physical    Patient: Ryan [name] HKV:[id number] DOB: 09-30-1945 DOA: 12/09/2022 DOS: the patient was seen and examined on 12/09/2022 PCP: Sandford Craze, NP  Patient coming from: Home  Chief Complaint:  Chief Complaint  Patient presents with   Emesis   HPI: Ryan [name] is a 77 y.o. male with medical history significant of seasonal allergies, osteoarthritis, GERD, varicella-zoster, hyperlipidemia, hypertension, CAD, history of MI, osteoporosis, idiopathic pulmonary fibrosis, type 2 diabetes who presented to the emergency department complaints of nausea, multiple episodes of emesis since 11 days ago when he tried TTI-101 currently on clinical study for pulmonary fibrosis.  He has also missed some Levemir doses since he has not been eating much. No diarrhea, constipation, melena or hematochezia.  No flank pain, dysuria, frequency or hematuria.  He denied fever, chills, rhinorrhea, sore throat, wheezing or hemoptysis.  No chest pain, palpitations, diaphoresis, PND, orthopnea or pitting edema of the lower extremities.  No polyuria, polydipsia, polyphagia or blurred vision.   Lab work: His urinalysis showed a specific gravity of 1.0 31, small hemoglobin, glucosuria more than 500, ketonuria 80 and proteinuria 30 mg/dL.  There was rare bacteria microscopic examination.  UDS is negative.  CBC 0 white count 12.7, hemoglobin 14.2 g/dL platelets 564.  Troponin was 26 then 22 ng/L.  Lactic acid was normal.  Beta hydroxybutyric acid was 6.72 mmol/L.  Phosphorus 1.4 mg/dL.  Normal lipase.  CMP with a CO2 of 15 mmol/L and an anion gap of 19.  Glucose is 264 and total bilirubin 2.0 mg/dL.  Renal function, the rest of the LFTs and the rest of the electrolytes were normal after sodium correction.  Venous blood gas showed a pH of 7.35, pCO2 of 32 and pO2 of 31 mmHg.  Bicarbonate 17.7 and acid-base deficit 6.8 mmol/L.  Imaging: CT abdomen/pelvis with contrast shows circumferential wall thickening of a  decompressed cecum ascending colon and hepatic flexure.  Some wall thickening may also involve the terminal ileum.  Findings suggesting segmental colitis, but also could be ileitis of the terminal ileum.  Stable appearance of chronic pulmonary fibrosis.  Evidence of prior granulomatous disease with calcified granulomas in liver and spleen.  Atherosclerosis of the abdominal aorta and iliac arteries without evidence of aneurysm.  ED course: Initial vital signs were temperature 97.5 F, pulse 79, respirations 17, BP 151/96 mmHg O2 sat 100% on room air.  Patient received ceftriaxone 2 g IVPB, metronidazole 500 mg IVPB, ondansetron 4 mg IVPB and 2000 mL of normal saline bolus.  I started the patient on insulin infusion given ketoacidosis.   Review of Systems: As mentioned in the history of present illness. All other systems reviewed and are negative. Past Medical History:  Diagnosis Date   Allergy    Arthritis    Diabetes mellitus    GERD (gastroesophageal reflux disease)    History of chicken pox    Hyperlipidemia    Hypertension    IPF (idiopathic pulmonary fibrosis) (HCC)    Myocardial infarction (HCC) 2017   Osteoporosis    Type 2 diabetes mellitus with hyperglycemia, with long-term current use of insulin (HCC) 09/08/2015   Past Surgical History:  Procedure Laterality Date   EYE SURGERY  1998   bilateral   SHOULDER SURGERY  1990   right shoulder, torn rotator cuff.   Social History:  reports that he quit smoking about 57 years ago. His smoking use included cigarettes. He has never used smokeless tobacco. He reports that he does not  drink alcohol and does not use drugs.  Allergies  Allergen Reactions   Other Other (See Comments)    Client states he became very sick and had significant skin inflammation and irritation with the Freestyle Libre sensor   Lisinopril     cough    Family History  Problem Relation Age of Onset   Cancer Mother        cancer   Heart disease Mother     Hyperlipidemia Mother    Hypertension Mother    Hyperlipidemia Father    Stroke Father    Hypertension Father    Diabetes Paternal Grandfather    Colon cancer Neg Hx    Stomach cancer Neg Hx    Esophageal cancer Neg Hx     Prior to Admission medications   Medication Sig Start Date End Date Taking? Authorizing Provider  alendronate (FOSAMAX) 70 MG tablet Take 1 tablet (70 mg total) by mouth every 7 (seven) days. Take with a full glass of water on an empty stomach. 09/09/22   Sandford Craze, NP  apixaban (ELIQUIS) 5 MG TABS tablet Take 1 tablet (5 mg total) by mouth 2 (two) times daily. 11/10/22   Pricilla Riffle, MD  atorvastatin (LIPITOR) 40 MG tablet TAKE 1 TABLET (40 MG TOTAL) BY MOUTH DAILY. 09/01/22   Sandford Craze, NP  blood glucose meter kit and supplies by Other route See admin instructions. Check blood sugar 6-7 times daily    [provider]  Calcium Carbonate-Vitamin D 600-400 MG-UNIT tablet Take 1 tablet by mouth 2 (two) times daily. Reported on 01/27/2016    [provider]  cholecalciferol (VITAMIN D3) 25 MCG (1000 UNIT) tablet Take 1,000 Units by mouth daily.    [provider]  Glucagon, rDNA, (GLUCAGON EMERGENCY) 1 MG KIT Inject 1 mg into the muscle once as needed for up to 1 dose. Patient taking differently: Inject 1 mg into the muscle as needed (blood sugar below 45). 02/26/21   Carlus Pavlov, MD  insulin aspart (NOVOLOG) 100 UNIT/ML injection Inject 2-3 Units into the skin 3 (three) times daily before meals. 05/28/22   Carlus Pavlov, MD  insulin degludec (TRESIBA FLEXTOUCH) 100 UNIT/ML FlexTouch Pen Inject 8 Units into the skin daily. Will start in 2024 after he completes current supply of Levemir. 07/13/22   [name], [name], RPH-CPP  insulin detemir (LEVEMIR) 100 UNIT/ML injection INJECT 0.8 ML (8 UNITS) INTO THE SKIN AT BEDTIME 05/28/22   Carlus Pavlov, MD  Insulin Syringe-Needle U-100 (TRUEPLUS INSULIN SYRINGE) 31G X 5/16" 0.5 ML  MISC USE TO INJECT INSULIN 4 TIMES A DAY 03/18/21   Carlus Pavlov, MD  metoprolol succinate (TOPROL-XL) 25 MG 24 hr tablet TAKE 1 TABLET (25 MG TOTAL) BY MOUTH DAILY. 09/01/22   Sandford Craze, NP  ondansetron (ZOFRAN-ODT) 4 MG disintegrating tablet Take 1 tablet (4 mg total) by mouth every 8 (eight) hours as needed for nausea or vomiting. 12/07/22   Tegeler, Canary Brim, MD    Physical Exam: Vitals:   12/09/22 0802 12/09/22 0807 12/09/22 1100 12/09/22 1209  BP: (!) 151/96  (!) 149/76   Pulse: 79  76   Resp: 17 (!) 25 (!) 22   Temp: (!) 97.5 F (36.4 C)   98 F (36.7 C)  TempSrc: Oral   Oral  SpO2: 100%  100%    Physical Exam Vitals and nursing note reviewed.  Constitutional:      Appearance: Normal appearance.  HENT:     Head: Normocephalic.  Nose: No rhinorrhea.     Mouth/Throat:     Mouth: Mucous membranes are dry.  Eyes:     General: No scleral icterus.    Pupils: Pupils are equal, round, and reactive to light.  Cardiovascular:     Rate and Rhythm: Regular rhythm. Tachycardia present.  Pulmonary:     Effort: Pulmonary effort is normal.     Breath sounds: Normal breath sounds.  Abdominal:     General: Bowel sounds are normal.     Palpations: Abdomen is soft.  Musculoskeletal:     Cervical back: Neck supple.     Right lower leg: No edema.     Left lower leg: No edema.  Skin:    General: Skin is warm and dry.  Neurological:     General: No focal deficit present.     Mental Status: He is alert and oriented to person, place, and time.  Psychiatric:        Mood and Affect: Mood normal.        Behavior: Behavior normal.   Data Reviewed:  Results are pending, will review when available. 01/13/2022 transthoracic echocardiogram IMPRESSIONS:   1. Left ventricular ejection fraction, by estimation, is 55 to 60%. The  left ventricle has normal function. The left ventricle has no regional  wall motion abnormalities. Left ventricular diastolic parameters are   indeterminate.   2. Right ventricular systolic function is normal. The right ventricular  size is normal. Tricuspid regurgitation signal is inadequate for assessing  PA pressure.   3. Left atrial size was mildly dilated.   4. The mitral valve is grossly normal. Mild mitral valve regurgitation.  No evidence of mitral stenosis.   5. The aortic valve is tricuspid. There is mild calcification of the  aortic valve. Aortic valve regurgitation is not visualized. No aortic  stenosis is present.   6. The inferior vena cava is normal in size with greater than 50%  respiratory variability, suggesting right atrial pressure of 3 mmHg.   EKG: Vent. rate 101 BPM PR interval 88 ms QRS duration 114 ms QT/QTcB 376/488 ms P-R-T axes -34 -42 84 Sinus tachycardia with irregular rate Abnormal R-wave progression, late transition LVH with IVCD, LAD and secondary repol abnrm ST elevation suggests acute pericarditis Borderline prolonged QT interval Baseline wander in lead(s) V1  Assessment and Plan: Principal Problem:   Metabolic acidosis Secondary to combined   DKA (diabetic ketoacidosis) (HCC)   Starvation ketoacidosis Observation/stepdown. Keep NPO. Continue IV fluids. Continue insulin infusion. Monitor CBG closely. BMP every 4 hours. BHA every 8 hours. Replace electrolytes as needed. Consult diabetes coordinator. Transition to SQ insulin per Endo tool.  Active Problems:   HTN (hypertension) Currently NPO. Resume metoprolol once cleared for oral intake.    Chronic diastolic CHF (congestive heart failure) (HCC) No signs of decompensation.    Borderline prolonged QT interval  Avoid QT prolonging meds as possible. KCl 10 mEq IVPB x 4. Magnesium sulfate 2 g IVPB now. Keep electrolytes optimized. Check EKG in the morning. Resume beta-blocker after DKA treatment.    Hyperlipidemia Hold statin. Currently NPO.    GERD (gastroesophageal reflux disease) Pantoprazole 40 mg IVPB every  24 hours.    Hyperbilirubinemia In the setting of involuntary starvation/volume depletion. Follow-up bilirubin level with tomorrow morning labs.    Hypophosphatemia Replacing. Follow-up level as needed.     Advance Care Planning:   Code Status: Full Code   Consults:   Family Communication:   Severity of Illness: The  appropriate patient status for this patient is OBSERVATION. Observation status is judged to be reasonable and necessary in order to provide the required intensity of service to ensure the patient's safety. The patient's presenting symptoms, physical exam findings, and initial radiographic and laboratory data in the context of their medical condition is felt to place them at decreased risk for further clinical deterioration. Furthermore, it is anticipated that the patient will be medically stable for discharge from the hospital within 2 midnights of admission.   Author: Bobette Mo, MD 12/09/2022 12:25 PM  For on call review [email or web address].   This document was prepared using Dragon voice recognition software and may contain some unintended transcription errors.

## 2022-12-09 NOTE — Progress Notes (Addendum)
TTitle: A Phase 2 Multicenter, Randomized, Double-blind, Placebo Controlled Study to Evaluate the Safety, Tolerability, Pharmacokinetics, and Efficacy of TTI-101 in Participants with Idiopathic Pulmonary Fibrosis    Mechanism of action: TTI-101 is a small-molecule inhibitor of STAT3 (signal transducer and activator of transcription 3). STAT3 plays a major role in the cellular processes involved in fibrosis including fibroblast proliferation. TTI-101 binds tightly to STAT3 which prevents STAT3 recruitment to signaling complexes that contain activated tyrosine kinases, thereby preventing STAT3 phosphorylation on Y705 and STAT3 activation. Binding of TTI-101 to STAT3 also prevents STAT3 dimerization. As such, TTI-101 is expected to prevent or reverse progression of fibrosis in IPF patients. Special Warnings/Considerations: Insufficient experience exists with TTI-101 to provide comprehensive warning guidance beyond what is standard for any investigational drug.  Interactions:  Studies indicated the potential for strong induction of CYP1A2 by TTI-101. As expected, TTI-101 did induce metabolism of pirfenidone, known to be primarily metabolized by CYP1A2. There was no interaction shown between TTI-101 and nintedanib. Drug Interaction Studies: [facility] conducted a DDI study in normal healthy volunteers given TTI-101 and either pirfenidone or nintedanib. Serious Adverse Reactions Observed in Clinical studies: 32 participants out of 105 reported SAE. (30.5%). However all SAEs reported were from study 2016-0842 (participants with solid tumors). No SAEs were reported in the DDI study (Healthy volunteer). Majority of SAE were GI disorders.  Serious Adverse Reactions Observed Postmarketing: To date, TTI-101 has not been registered for use or marketed in any jurisdiction. Safety Data:  The safety data from study 2016-0842 demonstrates the most frequent AEs are GI-related ( nausea, vomiting, change in appetite, diarrhea,  abdominal pain) and related to liver function (elevated AST and ALT).  Fewer AEs were reported with the SDD formulation. EKG - PR prolongation was seen in 1 out of 105 participants. (1%)    Overall Adverse events in total participants N=105 (%) Severity  Grade1-mild Grade2-Moderate Grade3-Severe Formulation 1  N=15 Formulation 2    N=47 Formulation 3 (SDD)  N=48*  Diarrhea 30 (28.6%) 24 (22.9%) 7 (6.7%) 8 (7.6%) 6 (40.0%) 21 (44.7%) 3 (6.3%)  Nausea 14 (13.3%) 9 (8.6%) 4 (3.8%) 1 (1.0%) 4 (26.7%) 7 (14.9%) 3 (6.3%)  Vomiting 7 (6.7%) 5 (4.8%) 2 (1.9%) - 3 (20.0%) 2 (4.3%) 2 (4.2%)   Abdominal pain 3 (2.9%) 2 (1.9%) 1 (1.0%) - 3 (20.0%) - -  Elevated ALT 7 (6.7%)   4 (3.8%)   3 (2.9%)   3 (2.9%) 2 (13.3%)  4 (8.5%)   1 (2.1%)   Elevated AST 6 (5.7%)   2 (1.9%)   4 (3.8%)   3 (2.9%) 1 (6.7%)  4 (8.5%)  1 (2.1%)   Headache 5 (4.8%) 5 (4.8%) - - - - 5 (10.4%)    TEAE from DDI study alone with Healthy Volunteer N=41 Part 1 nintedanib Part 2 Pirfenidone  Diarrhea  2 (4.9%)     1 (4.8%)  1 (5.0%)  Nausea 2 (4.9%)   1 (4.8%)   1 (5.0%)   Vomiting 1 (2.4%)     -  1 (5.0%)   Abdominal distension 1 (2.4%)     1 (4.8%)  -  Headache 5 (12.2%)   4 (19.0%)  1 (5.0%)   Xxxxxxxxxxxxxxxxxxxxxxxxxxxxxxxxxxxxxxxxxxxxxxxxxxxxxx  S : call from son 12/09/2022 around 6.30am and I spoke to him around 7am - saying patient vomitted all night. Spoke to him and patient wife and confirmed same. Advised EMS . Around 7.30am son informed he was on way to [facility]. I spoke to [employer]  ED-PA and gave updated. I then aroun d9.45am touched baes wth Dr Rubin Payor EDP and he infromed me aptient was very dehydrate and needs admission   Family confirnmed last study drug was 2 days ago    A Research IPF  This is an SAE and we will report it to sponsor/FDA ( a first report already submitted yesterday) Most likely symoptoms related to study drug but in 48h should have washed out - so treatment team  likely to explore other reasons  Plan  - EDP Rx - No more study drug on this protocol - will talk to patient at some point if he wants to follow along on the study v withdraw but no more drug admin    SIGNATURE    Dr. Kalman Shan, M.D., F.C.C.P, ACRP-CPI Pulmonary and Critical Care Medicine Research Investigator, [facility] @ [facility] Health Staff Physician, [facility] Center Director - Interstitial Lung Disease  Program  Pulmonary Fibrosis [facility] [place] Pul[facility] and [facility] @ [facility] [place], [place], [address]   Pager: [phone], If no answer  OR between  19:00-7:00h: page [phone] Telephone (research): [phone]  10:02 AM 12/09/2022   10:02 AM 12/09/2022

## 2022-12-09 NOTE — Telephone Encounter (Signed)
Spoke with the pt's son  He called EMS this morning and pt has been transported to [facility]  It appears that they have already done the labs on him  Routing to MR as Ryan [name]

## 2022-12-09 NOTE — Inpatient Diabetes Management (Signed)
Inpatient Diabetes Program Recommendations  AACE/ADA: New Consensus Statement on Inpatient Glycemic Control (2015)  Target Ranges:  Prepandial:   less than 140 mg/dL      Peak postprandial:   less than 180 mg/dL (1-2 hours)      Critically ill patients:  140 - 180 mg/dL   Lab Results  Component Value Date   GLUCAP 228 (H) 12/09/2022   HGBA1C 7.3 (A) 10/01/2022    Review of Glycemic Control  Diabetes history: DM1 Outpatient Diabetes medications: Tresiba 8 units QD, Novolog 2-3 units TID Current orders for Inpatient glycemic control: IV insulin per EndoTool for DKA  HgbA1C - 7.3%  Inpatient Diabetes Program Recommendations:    Agree with orders.  Will speak with pt at bedside in am regarding his diabetes and glucose control.   Continue to follow.  Thank you. Ailene Ards, RD, LDN, CDCES Inpatient Diabetes Coordinator [phone]

## 2022-12-10 DIAGNOSIS — K529 Noninfective gastroenteritis and colitis, unspecified: Secondary | ICD-10-CM | POA: Diagnosis present

## 2022-12-10 DIAGNOSIS — K219 Gastro-esophageal reflux disease without esophagitis: Secondary | ICD-10-CM | POA: Diagnosis present

## 2022-12-10 DIAGNOSIS — I110 Hypertensive heart disease with heart failure: Secondary | ICD-10-CM | POA: Diagnosis present

## 2022-12-10 DIAGNOSIS — I5032 Chronic diastolic (congestive) heart failure: Secondary | ICD-10-CM | POA: Diagnosis present

## 2022-12-10 DIAGNOSIS — I2510 Atherosclerotic heart disease of native coronary artery without angina pectoris: Secondary | ICD-10-CM | POA: Diagnosis present

## 2022-12-10 DIAGNOSIS — Z8249 Family history of ischemic heart disease and other diseases of the circulatory system: Secondary | ICD-10-CM | POA: Diagnosis not present

## 2022-12-10 DIAGNOSIS — Z7901 Long term (current) use of anticoagulants: Secondary | ICD-10-CM | POA: Diagnosis not present

## 2022-12-10 DIAGNOSIS — R059 Cough, unspecified: Secondary | ICD-10-CM | POA: Diagnosis present

## 2022-12-10 DIAGNOSIS — T730XXA Starvation, initial encounter: Secondary | ICD-10-CM | POA: Diagnosis present

## 2022-12-10 DIAGNOSIS — E872 Acidosis, unspecified: Secondary | ICD-10-CM | POA: Diagnosis not present

## 2022-12-10 DIAGNOSIS — I252 Old myocardial infarction: Secondary | ICD-10-CM | POA: Diagnosis not present

## 2022-12-10 DIAGNOSIS — Z794 Long term (current) use of insulin: Secondary | ICD-10-CM | POA: Diagnosis not present

## 2022-12-10 DIAGNOSIS — Z79899 Other long term (current) drug therapy: Secondary | ICD-10-CM | POA: Diagnosis not present

## 2022-12-10 DIAGNOSIS — K7460 Unspecified cirrhosis of liver: Secondary | ICD-10-CM | POA: Diagnosis present

## 2022-12-10 DIAGNOSIS — E785 Hyperlipidemia, unspecified: Secondary | ICD-10-CM | POA: Diagnosis present

## 2022-12-10 DIAGNOSIS — E1110 Type 2 diabetes mellitus with ketoacidosis without coma: Secondary | ICD-10-CM | POA: Diagnosis present

## 2022-12-10 DIAGNOSIS — M810 Age-related osteoporosis without current pathological fracture: Secondary | ICD-10-CM | POA: Diagnosis present

## 2022-12-10 DIAGNOSIS — Z83438 Family history of other disorder of lipoprotein metabolism and other lipidemia: Secondary | ICD-10-CM | POA: Diagnosis not present

## 2022-12-10 DIAGNOSIS — I480 Paroxysmal atrial fibrillation: Secondary | ICD-10-CM | POA: Diagnosis present

## 2022-12-10 DIAGNOSIS — J84112 Idiopathic pulmonary fibrosis: Secondary | ICD-10-CM | POA: Diagnosis present

## 2022-12-10 DIAGNOSIS — E876 Hypokalemia: Secondary | ICD-10-CM | POA: Diagnosis present

## 2022-12-10 DIAGNOSIS — Z7983 Long term (current) use of bisphosphonates: Secondary | ICD-10-CM | POA: Diagnosis not present

## 2022-12-10 DIAGNOSIS — E860 Dehydration: Secondary | ICD-10-CM | POA: Diagnosis present

## 2022-12-10 DIAGNOSIS — Z87891 Personal history of nicotine dependence: Secondary | ICD-10-CM | POA: Diagnosis not present

## 2022-12-10 LAB — CULTURE, BLOOD (ROUTINE X 2): Special Requests: ADEQUATE

## 2022-12-10 LAB — BASIC METABOLIC PANEL
Anion gap: 9 (ref 5–15)
BUN: 12 mg/dL (ref 8–23)
CO2: 20 mmol/L — ABNORMAL LOW (ref 22–32)
Calcium: 7.5 mg/dL — ABNORMAL LOW (ref 8.9–10.3)
Chloride: 106 mmol/L (ref 98–111)
Creatinine, Ser: 0.71 mg/dL (ref 0.61–1.24)
GFR, Estimated: >60 mL/min (ref >60)
Glucose, Bld: 120 mg/dL — ABNORMAL HIGH (ref 70–99)
Potassium: 3.3 mmol/L — ABNORMAL LOW (ref 3.5–5.1)
Sodium: 135 mmol/L (ref 135–145)

## 2022-12-10 LAB — GLUCOSE, CAPILLARY
Glucose-Capillary: 135 mg/dL — ABNORMAL HIGH (ref 70–99)
Glucose-Capillary: 105 mg/dL — ABNORMAL HIGH (ref 70–99)
Glucose-Capillary: 172 mg/dL — ABNORMAL HIGH (ref 70–99)
Glucose-Capillary: 189 mg/dL — ABNORMAL HIGH (ref 70–99)
Glucose-Capillary: 160 mg/dL — ABNORMAL HIGH (ref 70–99)
Glucose-Capillary: 135 mg/dL — ABNORMAL HIGH (ref 70–99)
Glucose-Capillary: 151 mg/dL — ABNORMAL HIGH (ref 70–99)
Glucose-Capillary: 160 mg/dL — ABNORMAL HIGH (ref 70–99)
Glucose-Capillary: 141 mg/dL — ABNORMAL HIGH (ref 70–99)
Glucose-Capillary: 132 mg/dL — ABNORMAL HIGH (ref 70–99)
Glucose-Capillary: 125 mg/dL — ABNORMAL HIGH (ref 70–99)
Glucose-Capillary: 162 mg/dL — ABNORMAL HIGH (ref 70–99)

## 2022-12-10 LAB — COMPREHENSIVE METABOLIC PANEL
ALT: 16 U/L (ref 0–44)
AST: 16 U/L (ref 15–41)
Albumin: 2.6 g/dL — ABNORMAL LOW (ref 3.5–5.0)
Alkaline Phosphatase: 54 U/L (ref 38–126)
Anion gap: 10 (ref 5–15)
BUN: 12 mg/dL (ref 8–23)
CO2: 19 mmol/L — ABNORMAL LOW (ref 22–32)
Calcium: 7.4 mg/dL — ABNORMAL LOW (ref 8.9–10.3)
Chloride: 105 mmol/L (ref 98–111)
Creatinine, Ser: 0.75 mg/dL (ref 0.61–1.24)
GFR, Estimated: >60 mL/min (ref >60)
Glucose, Bld: 185 mg/dL — ABNORMAL HIGH (ref 70–99)
Potassium: 3.1 mmol/L — ABNORMAL LOW (ref 3.5–5.1)
Sodium: 134 mmol/L — ABNORMAL LOW (ref 135–145)
Total Bilirubin: 1.5 mg/dL — ABNORMAL HIGH (ref 0.3–1.2)
Total Protein: 5.9 g/dL — ABNORMAL LOW (ref 6.5–8.1)

## 2022-12-10 LAB — BETA-HYDROXYBUTYRIC ACID
Beta-Hydroxybutyric Acid: 2.91 mmol/L — ABNORMAL HIGH (ref 0.05–0.27)
Beta-Hydroxybutyric Acid: 0.47 mmol/L — ABNORMAL HIGH (ref 0.05–0.27)

## 2022-12-10 LAB — CBC
HCT: 35.9 % — ABNORMAL LOW (ref 39.0–52.0)
Hemoglobin: 12.2 g/dL — ABNORMAL LOW (ref 13.0–17.0)
MCH: 30.4 pg (ref 26.0–34.0)
MCHC: 34 g/dL (ref 30.0–36.0)
MCV: 89.5 fL (ref 80.0–100.0)
Platelets: 253 10*3/uL (ref 150–400)
RBC: 4.01 MIL/uL — ABNORMAL LOW (ref 4.22–5.81)
RDW: 13.7 % (ref 11.5–15.5)
WBC: 9.5 10*3/uL (ref 4.0–10.5)
nRBC: 0 % (ref 0.0–0.2)

## 2022-12-10 LAB — MAGNESIUM: Magnesium: 2.2 mg/dL (ref 1.7–2.4)

## 2022-12-10 LAB — PHOSPHORUS: Phosphorus: 1.4 mg/dL — ABNORMAL LOW (ref 2.5–4.6)

## 2022-12-10 MED ORDER — INSULIN ASPART 100 UNIT/ML IJ SOLN
0.0000 [IU] | Freq: Three times a day (TID) | INTRAMUSCULAR | Status: DC
  Administered 2022-12-10 (×2): 1 [IU] via SUBCUTANEOUS
  Administered 2022-12-11: 2 [IU] via SUBCUTANEOUS
  Administered 2022-12-11: 7 [IU] via SUBCUTANEOUS

## 2022-12-10 MED ORDER — K PHOS MONO-SOD PHOS DI & MONO 155-852-130 MG PO TABS
500.0000 mg | ORAL_TABLET | Freq: Three times a day (TID) | ORAL | Status: DC
  Administered 2022-12-10 – 2022-12-11 (×3): 500 mg via ORAL
  Filled 2022-12-10 (×4): qty 2

## 2022-12-10 MED ORDER — ENSURE MAX PROTEIN PO LIQD
11.0000 [oz_av] | Freq: Every day | ORAL | Status: DC
  Administered 2022-12-11: 11 [oz_av] via ORAL
  Filled 2022-12-10: qty 330

## 2022-12-10 MED ORDER — METOPROLOL SUCCINATE ER 25 MG PO TB24
25.0000 mg | ORAL_TABLET | Freq: Every day | ORAL | Status: DC
  Administered 2022-12-10 – 2022-12-11 (×2): 25 mg via ORAL
  Filled 2022-12-10 (×2): qty 1

## 2022-12-10 MED ORDER — INSULIN GLARGINE-YFGN 100 UNIT/ML ~~LOC~~ SOLN
8.0000 [IU] | Freq: Every day | SUBCUTANEOUS | Status: DC
  Administered 2022-12-10 – 2022-12-11 (×2): 8 [IU] via SUBCUTANEOUS
  Filled 2022-12-10 (×2): qty 0.08

## 2022-12-10 MED ORDER — POTASSIUM CHLORIDE CRYS ER 20 MEQ PO TBCR
40.0000 meq | EXTENDED_RELEASE_TABLET | ORAL | Status: AC
  Administered 2022-12-10 (×2): 40 meq via ORAL
  Filled 2022-12-10 (×2): qty 2

## 2022-12-10 MED ORDER — INSULIN ASPART 100 UNIT/ML IJ SOLN: 0.0000 [IU] | Freq: Every day | INTRAMUSCULAR | Status: DC

## 2022-12-10 MED ORDER — APIXABAN 5 MG PO TABS
5.0000 mg | ORAL_TABLET | Freq: Two times a day (BID) | ORAL | Status: DC
  Administered 2022-12-10 – 2022-12-11 (×3): 5 mg via ORAL
  Filled 2022-12-10 (×3): qty 1

## 2022-12-10 MED ORDER — GLUCERNA SHAKE PO LIQD
237.0000 mL | ORAL | Status: DC
  Administered 2022-12-10: 237 mL via ORAL
  Filled 2022-12-10 (×2): qty 237

## 2022-12-10 NOTE — Progress Notes (Signed)
Initial Nutrition Assessment  DOCUMENTATION CODES:   Severe malnutrition in context of acute illness/injury  INTERVENTION:  - Full Liquid diet per MD. Advance as tolerated.  - Ensure Max po once daily, provides 150 kcal and 30 grams of protein.  - Glucerna Shake po once daily, provides 220 kcal and 10 grams of protein. - Encourage intake at all meals and of nutrition supplements.  - Monitor weight trends.    NUTRITION DIAGNOSIS:   Severe Malnutrition related to acute illness as evidenced by energy intake < or equal to 50% for > or equal to 5 days, mild fat depletion, moderate muscle depletion, percent weight loss (9.5% in less than 1 month).  GOAL:   Patient will meet greater than or equal to 90% of their needs  MONITOR:   PO intake, Supplement acceptance, Weight trends, Diet advancement  REASON FOR ASSESSMENT:   Malnutrition Screening Tool    ASSESSMENT:   77 year old male with PMH HLD, HTN, CAD, idiopathic pulmonary fibrosis, diabetes type 2 who presented with complaint of nausea, vomiting after starting a new drug while being currently enrolled in clinical study for pulmonary fibrosis. Admitted for metabolic acidosis with DKA and colitis.    Patient reports a UBW of 167# and reports he has lost weight over the past ~2 weeks due to N/V and not eating.  Per EMR, patient weighed at 169# in March and now weighed at 153#. This would be a 16# or 9.5% weight loss within the past ~1 month (per pt report within the past 2 weeks), which is severe for the time frame.   He endorses typically eating 3 meals a day with good appetite when feeling well. States the drug he was taking for for the clinical trial made him develop nausea and vomiting so over the past 11 days he has eaten nothing.   Patient reports he is finally feeling well enough to try and eat and had some of his breakfast. Tray in room and this RD observed a few bites of jello and some chicken broth was consumed. Patient  notes he plans to take it very slow on eating more to assess his tolerance. Encouraged patient to always order three meals a day and try and increase intake at each meal.   He is agreeable to trying some nutrition supplements during admission. Notes he has tried some in the past and they have "messed" with his blood sugar. Agreeable to try Glucerna and Ensure Max.   Medications reviewed and include: -  Labs reviewed:  Na 134 K+ 3.1 HA1C 7.3 Blood Glucose 105-291 x24 hours   NUTRITION - FOCUSED PHYSICAL EXAM:  Flowsheet Row Most Recent Value  Orbital Region Mild depletion  Upper Arm Region Moderate depletion  Thoracic and Lumbar Region Mild depletion  Buccal Region Mild depletion  Temple Region Moderate depletion  Clavicle Bone Region Mild depletion  Clavicle and Acromion Bone Region Moderate depletion  Scapular Bone Region Unable to assess  Dorsal Hand Mild depletion  Patellar Region Mild depletion  Anterior Thigh Region Mild depletion  Posterior Calf Region Mild depletion  Edema (RD Assessment) None  Hair Reviewed  Eyes Reviewed  Mouth Reviewed  [missing several front top teeth]  Skin Reviewed  Nails Reviewed       Diet Order:   Diet Order             Diet full liquid Fluid consistency: Thin  Diet effective now  EDUCATION NEEDS:  Education needs have been addressed  Skin:  Skin Assessment: Reviewed RN Assessment  Last BM:  PTA  Height:  Ht Readings from Last 1 Encounters:  12/09/22 5\' 8"  (1.727 m)   Weight:  Wt Readings from Last 1 Encounters:  12/09/22 69.4 kg    BMI:  Body mass index is 23.26 kg/m.  Estimated Nutritional Needs:  Kcal:  1950-2100 kcals Protein:  90-105 grams Fluid:  >/= 2L    Shelle Iron RD, LDN For contact information, refer to [facility].

## 2022-12-10 NOTE — Progress Notes (Addendum)
   Rsearch PI note   S: reviewed notes. 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 Likely due to study drug  Plan - per triad  - no more study drug - will see him for research visit next week(appt +) > wil ofer him opportunity to stay in study without study drug  OR exit study -., > But NO study drug  Future Appointments  Date Time Provider Department Center  12/14/2022 10:00 AM [facility] None  03/01/2023 11:40 AM Carlus Pavlov, MD [facility] None  03/02/2023 10:00 AM Sandford Craze, NP [facility]  03/17/2023  8:00 AM Pricilla Riffle, MD [facility]    SIGNATURE    Dr. Kalman Shan, M.D., F.C.C.P, ACRP-CPI Pulmonary and Critical Care Medicine Research Investigator, [facility] @ [facility] Health Staff Physician, [facility] Center Director - Interstitial Lung Disease  Program  Pulmonary Fibrosis [facility] [place] [facility]monary and [facility] @ [facility] [place], [place], [address]   Pager: [phone], If no answer  OR between  19:00-7:00h: page [phone] Telephone (research): [phone]  12:55 PM 12/10/2022   12:55 PM 12/10/2022

## 2022-12-10 NOTE — Progress Notes (Signed)
PROGRESS NOTE  Ryan [name]  ZOX:[id number] DOB: [birth year] DOA: 12/09/2022 PCP: Sandford Craze, NP   Brief Narrative:  Patient is a 77 year old male with history of hyperlipidemia, hypertension, coronary artery disease, idiopathic pulmonary fibrosis, diabetes type 2 who presented to the emergency department with complaint of nausea, vomiting after starting a new drug while being currently enrolled in clinical study for pulmonary fibrosis.  Also missed some doses of insulin.  On presentation lab work showed phosphorus 1.4, CO2 15, anion gap 19, glucose of 264.  CT abdomen/pelvis showed circumferential wall thickening of the decompressed cecum, ascending colon hepatic flexure suggestive of segmental colitis.  Patient was started on broad spectrum  antibiotics for colitis , IV insulin for DKA  Assessment & Plan:  Principal Problem:   Metabolic acidosis Active Problems:   HTN (hypertension)   Chronic diastolic CHF (congestive heart failure) (HCC)   Hyperlipidemia   GERD (gastroesophageal reflux disease)   Hyperbilirubinemia   Hypophosphatemia   DKA (diabetic ketoacidosis) (HCC)   Starvation ketoacidosis   Borderline prolonged QT interval  DKA: History of type 2 diabetes:.  A1c of 7.3.  Started on DKA protocol. On presentation ,lab work showed phosphorus 1.4, CO2 15, anion gap 19, glucose of 264. IV insulin will be discontinued, started on long-acting and sliding scale.  Diabetic coordinator  following.  Colitis: CT scan showed Circumferential wall thickening of a decompressed cecum,ascending colon and hepatic flexure. Some wall thickening may also involve the terminal ileum.  Patient denies any abdomen pain,  or vomiting.  Abdomen is soft, nondistended , nontender on examination.  Continue antibiotics for now.  Follow-up GI pathogen  Hypokalemia/hypophosphatemia: Currently being supplemented and ,monitored  Hypertension: Takes metoprolol at home, resumed  Chronic diastolic CHF:  Currently euvolemic  Hyperlipidemia: Takes statin at home.  GERD: Continue PPI  Pulmonary fibrosis: Recently started on a clinical trial.On TTI-[id number]  Follows with Dr. Marchelle Gearing  Paroxysmal A-fib:Takes metoprolol for rate control, on Eliquis for anticoagulation.  Follows with Dr. Tenny Craw.Monitor on tele.  Remains in A-fib this morning         DVT prophylaxis:enoxaparin (LOVENOX) injection 40 mg Start: 12/09/22 2200     Code Status: Full Code  Family Communication: None at bedside  Patient status:Inpatient  Patient is from :Home  Anticipated discharge WJ:XBJY  Estimated DC date:tomorrow   Consultants: None  Procedures:None  Antimicrobials:  Anti-infectives (From admission, onward)    Start     Dose/Rate Route Frequency Ordered Stop   12/10/22 2200  metroNIDAZOLE (FLAGYL) IVPB 500 mg        500 mg 100 mL/hr over 60 Minutes Intravenous Every 12 hours 12/09/22 1406     12/10/22 1000  cefTRIAXone (ROCEPHIN) 2 g in sodium chloride 0.9 % 100 mL IVPB        2 g 200 mL/hr over 30 Minutes Intravenous Every 24 hours 12/09/22 1406     12/09/22 1200  cefTRIAXone (ROCEPHIN) 2 g in sodium chloride 0.9 % 100 mL IVPB       See Hyperspace for full Linked Orders Report.   2 g 200 mL/hr over 30 Minutes Intravenous  Once 12/09/22 1146 12/09/22 1237   12/09/22 1200  metroNIDAZOLE (FLAGYL) IVPB 500 mg       See Hyperspace for full Linked Orders Report.   500 mg 100 mL/hr over 60 Minutes Intravenous  Once 12/09/22 1146 12/09/22 1337       Subjective: Patient seen and examined at bedside today.  Hemodynamically stable.  Feels better than  yesterday.  Some nausea but no abdominal pain or vomiting.  Wants to remain on liquid diet for this morning.  Objective: Vitals:   12/10/22 0400 12/10/22 0500 12/10/22 0600 12/10/22 0700  BP: (!) 161/90 (!) 153/72 (!) 154/70 (!) 158/90  Pulse: 91 76 66 69  Resp: (!) 29 (!) 24 (!) 28 (!) 27  Temp: 98.1 F (36.7 C)     TempSrc: Oral     SpO2: 99%  98% 98% 95%  Weight:      Height:        Intake/Output Summary (Last 24 hours) at 12/10/2022 0741 Last data filed at 12/10/2022 1610 Gross per 24 hour  Intake 5125.65 ml  Output 770 ml  Net 4355.65 ml   Filed Weights   12/09/22 1611  Weight: 69.4 kg    Examination:  General exam: Overall comfortable, not in distress HEENT: PERRL Respiratory system:  no wheezes or crackles  Cardiovascular system: S1 & S2 heard, RRR.  Gastrointestinal system: Abdomen is nondistended, soft and nontender. Central nervous system: Alert and oriented Extremities: No edema, no clubbing ,no cyanosis Skin: No rashes, no ulcers,no icterus     Data Reviewed: I have personally reviewed following labs and imaging studies  CBC: Recent Labs  Lab 12/07/22 1604 12/07/22 1807 12/09/22 0814 12/10/22 0306  WBC 16.2*  --  12.7* 9.5  HGB 14.8 15.3 14.2 12.2*  HCT 45.6 45.0 43.9 35.9*  MCV 91.6  --  92.4 89.5  PLT 339  --  327 253   Basic Metabolic Panel: Recent Labs  Lab 12/09/22 0813 12/09/22 0814 12/09/22 1330 12/09/22 1616 12/09/22 2101 12/10/22 0059 12/10/22 0306  NA  --    < > 136 136 133* 135 134*  K  --    < > 3.8 3.6 3.9 3.3* 3.1*  CL  --    < > 105 108 106 106 105  CO2  --    < > 15* 15* 16* 20* 19*  GLUCOSE  --    < > 201* 160* 170* 120* 185*  BUN  --    < > 19 16 14 12 12   CREATININE  --    < > 0.80 0.80 0.74 0.71 0.75  CALCIUM  --    < > 7.8* 7.6* 7.4* 7.5* 7.4*  MG 2.4  --   --   --   --   --   --   PHOS 1.4*  --   --   --   --   --   --    < > = values in this interval not displayed.     Recent Results (from the past 240 hour(s))  Blood culture (routine x 2)     Status: None (Preliminary result)   Collection Time: 12/07/22  5:34 PM   Specimen: BLOOD  Result Value Ref Range Status   Specimen Description   Final    BLOOD BLOOD RIGHT HAND Performed at [facility], [address] [place], [place] [address]    Special Requests   Final    BOTTLES DRAWN AEROBIC  AND ANAEROBIC Blood Culture adequate volume Performed at [facility], [address] [place], [place] [address]    Culture   Final    NO GROWTH 1 DAY Performed at [facility], [address] [place], [place] [address]    Report Status PENDING  Incomplete  Blood culture (routine x 2)     Status:  None (Preliminary result)   Collection Time: 12/07/22  6:00 PM   Specimen: BLOOD  Result Value Ref Range Status   Specimen Description   Final    BLOOD RIGHT ANTECUBITAL Performed at [facility], [address] [place], [place] [address]    Special Requests   Final    BOTTLES DRAWN AEROBIC AND ANAEROBIC Blood Culture adequate volume Performed at [facility], [address] [place], [place] [address]    Culture   Final    NO GROWTH 1 DAY Performed at [facility], [address], [place], [place] [address]    Report Status PENDING  Incomplete     Radiology Studies: CT ABDOMEN PELVIS W CONTRAST  Result Date: 12/09/2022 CLINICAL DATA:  Abdominal pain, nausea, vomiting and diarrhea. EXAM: CT ABDOMEN AND PELVIS WITH CONTRAST TECHNIQUE: Multidetector CT imaging of the abdomen and pelvis was performed using the standard protocol following bolus administration of intravenous contrast. RADIATION DOSE REDUCTION: This exam was performed according to the departmental dose-optimization program which includes automated exposure control, adjustment of the mA and/or kV according to patient size and/or use of iterative reconstruction technique. CONTRAST:  OMNIPAQUE IOHEXOL 300 MG/ML  SOLN COMPARISON:  CT of the chest on 11/29/2022 FINDINGS: Lower chest: Stable appearance of chronic pulmonary fibrosis affecting the peripheral lung zones at the visualized lung bases. Hepatobiliary: Scattered calcified granulomata in the liver. No solid hepatic masses. No gallstones, gallbladder wall thickening, or biliary dilatation. Pancreas: Unremarkable. No pancreatic  ductal dilatation or surrounding inflammatory changes. Spleen: Multiple punctate calcified granulomata within a normal-sized spleen. Adrenals/Urinary Tract: Adrenal glands are unremarkable. Kidneys are normal, without renal calculi, focal lesion, or hydronephrosis. Bladder is unremarkable. Stomach/Bowel: There is some circumferential wall thickening of a decompressed cecum, ascending colon and hepatic flexure. Some wall thickening may also involve the terminal ileum. Findings are suggestive of segmental colitis and also potentially ileitis of the terminal ileum. No associated evidence to suggest bowel perforation, obstruction or focal abscess. No free intraperitoneal air. Diverticulosis of the sigmoid and descending colon without evidence of diverticulitis. Vascular/Lymphatic: Atherosclerosis of the abdominal aorta and iliac arteries without evidence of aneurysm. No lymphadenopathy identified in the abdomen or pelvis. Reproductive: Prostate is unremarkable. Other: No abdominal wall hernia or abnormality. No abdominopelvic ascites. Musculoskeletal: Lumbar degenerative disc disease, most prominently at L5-S1. IMPRESSION: 1. Circumferential wall thickening of a decompressed cecum, ascending colon and hepatic flexure. Some wall thickening may also involve the terminal ileum. Findings are suggestive of segmental colitis and also potentially ileitis of the terminal ileum. No evidence to suggest bowel perforation, obstruction or focal abscess. 2. Stable appearance of chronic pulmonary fibrosis at the visualized lung bases. 3. Evidence of prior granulomatous disease with calcified granulomata in the liver and spleen. 4. Atherosclerosis of the abdominal aorta and iliac arteries without evidence of aneurysm. Aortic Atherosclerosis (ICD10-I70.0). Electronically Signed   By: Irish Lack M.D.   On: 12/09/2022 11:11    Scheduled Meds:  Chlorhexidine Gluconate Cloth  6 each Topical Daily   enoxaparin (LOVENOX) injection   40 mg Subcutaneous Q24H   pantoprazole (PROTONIX) IV  40 mg Intravenous Daily   Continuous Infusions:  cefTRIAXone (ROCEPHIN)  IV     dextrose 5% lactated ringers 125 mL/hr at 12/10/22 0627   insulin Stopped (12/09/22 1331)   lactated ringers Stopped (12/09/22 1331)   metronidazole       LOS: 0 days   Burnadette Pop, MD [facility] P5/10/2022, 7:41  AM  

## 2022-12-10 NOTE — Inpatient Diabetes Management (Signed)
Inpatient Diabetes Program Recommendations  AACE/ADA: New Consensus Statement on Inpatient Glycemic Control (2015)  Target Ranges:  Prepandial:   less than 140 mg/dL      Peak postprandial:   less than 180 mg/dL (1-2 hours)      Critically ill patients:  140 - 180 mg/dL   Lab Results  Component Value Date   GLUCAP 132 (H) 12/10/2022   HGBA1C 7.3 (A) 10/01/2022    Review of Glycemic Control  Diabetes history: DM2 Outpatient Diabetes medications: Tresiba 8 QD, Novolog 2-3 units TID Current orders for Inpatient glycemic control: Semglee 8 units QD, Novolog 0-9 units TID with meals and 0-5 HS  HgbA1C - 7.3% Endo - Dr Elvera Lennox  Inpatient Diabetes Program Recommendations:    Agree with orders.  Spoke with pt at bedside regarding his diabetes control and HgbA1C. States he sees Dr Elvera Lennox every 4-6 months and monitors his blood sugars 7x/day. Keeps HgbA1C at 7.3%. Pt states he was participating in investigational medical product (don't know if actual drug or placebo) and his blood sugars started going up to 400-500s. Has stopped taking this drug. Blood sugars have normalized. States he tries to eat healthy and get some exercise. Will f/u with Endo in next month or so. Answered all questions/concerns. Has not had hypoglycemia. Discussed s/s and treatment.   Continue to follow while inpatient.   Thank you. Ailene Ards, RD, LDN, CDCES Inpatient Diabetes Coordinator [phone]

## 2022-12-11 DIAGNOSIS — E872 Acidosis, unspecified: Secondary | ICD-10-CM | POA: Diagnosis not present

## 2022-12-11 LAB — GLUCOSE, CAPILLARY
Glucose-Capillary: 182 mg/dL — ABNORMAL HIGH (ref 70–99)
Glucose-Capillary: 320 mg/dL — ABNORMAL HIGH (ref 70–99)
Glucose-Capillary: 197 mg/dL — ABNORMAL HIGH (ref 70–99)

## 2022-12-11 LAB — GASTROINTESTINAL PANEL BY PCR, STOOL (REPLACES STOOL CULTURE)

## 2022-12-11 LAB — COMPREHENSIVE METABOLIC PANEL
ALT: 16 U/L (ref 0–44)
AST: 16 U/L (ref 15–41)
Albumin: 2.6 g/dL — ABNORMAL LOW (ref 3.5–5.0)
Alkaline Phosphatase: 56 U/L (ref 38–126)
Anion gap: 14 (ref 5–15)
BUN: 8 mg/dL (ref 8–23)
CO2: 19 mmol/L — ABNORMAL LOW (ref 22–32)
Calcium: 7.6 mg/dL — ABNORMAL LOW (ref 8.9–10.3)
Chloride: 99 mmol/L (ref 98–111)
Creatinine, Ser: 0.73 mg/dL (ref 0.61–1.24)
GFR, Estimated: >60 mL/min (ref >60)
Glucose, Bld: 250 mg/dL — ABNORMAL HIGH (ref 70–99)
Potassium: 3.6 mmol/L (ref 3.5–5.1)
Sodium: 132 mmol/L — ABNORMAL LOW (ref 135–145)
Total Bilirubin: 1.3 mg/dL — ABNORMAL HIGH (ref 0.3–1.2)
Total Protein: 5.8 g/dL — ABNORMAL LOW (ref 6.5–8.1)

## 2022-12-11 LAB — CBC
HCT: 37.1 % — ABNORMAL LOW (ref 39.0–52.0)
Hemoglobin: 12.5 g/dL — ABNORMAL LOW (ref 13.0–17.0)
MCH: 30.3 pg (ref 26.0–34.0)
MCHC: 33.7 g/dL (ref 30.0–36.0)
MCV: 89.8 fL (ref 80.0–100.0)
Platelets: 237 10*3/uL (ref 150–400)
RBC: 4.13 MIL/uL — ABNORMAL LOW (ref 4.22–5.81)
RDW: 13.5 % (ref 11.5–15.5)
WBC: 8.3 10*3/uL (ref 4.0–10.5)
nRBC: 0 % (ref 0.0–0.2)

## 2022-12-11 LAB — CULTURE, BLOOD (ROUTINE X 2): Culture: NO GROWTH

## 2022-12-11 LAB — PHOSPHORUS: Phosphorus: 1.7 mg/dL — ABNORMAL LOW (ref 2.5–4.6)

## 2022-12-11 MED ORDER — AMOXICILLIN-POT CLAVULANATE 875-125 MG PO TABS: 1.0000 | ORAL_TABLET | Freq: Two times a day (BID) | ORAL | Status: DC

## 2022-12-11 MED ORDER — PANTOPRAZOLE SODIUM 40 MG PO TBEC: 40.0000 mg | DELAYED_RELEASE_TABLET | Freq: Every day | ORAL | 0 refills | Status: DC

## 2022-12-11 MED ORDER — AMOXICILLIN-POT CLAVULANATE 875-125 MG PO TABS: 1.0000 | ORAL_TABLET | Freq: Two times a day (BID) | ORAL | 0 refills | Status: AC

## 2022-12-11 MED ORDER — PANTOPRAZOLE SODIUM 40 MG PO TBEC
40.0000 mg | DELAYED_RELEASE_TABLET | Freq: Every day | ORAL | 0 refills | Status: DC
  Filled 2022-12-11: qty 14, 14d supply, fill #0

## 2022-12-11 MED ORDER — K PHOS MONO-SOD PHOS DI & MONO 155-852-130 MG PO TABS
500.0000 mg | ORAL_TABLET | Freq: Three times a day (TID) | ORAL | 0 refills | Status: DC
  Filled 2022-12-11: qty 18, 3d supply, fill #0

## 2022-12-11 MED ORDER — K PHOS MONO-SOD PHOS DI & MONO 155-852-130 MG PO TABS: 500.0000 mg | ORAL_TABLET | Freq: Three times a day (TID) | ORAL | 0 refills | Status: AC

## 2022-12-11 MED ORDER — AMOXICILLIN-POT CLAVULANATE 875-125 MG PO TABS
1.0000 | ORAL_TABLET | Freq: Two times a day (BID) | ORAL | 0 refills | Status: DC
  Filled 2022-12-11: qty 14, 7d supply, fill #0

## 2022-12-11 NOTE — Discharge Summary (Signed)
Physician Discharge Summary  Ryan [name] WGN:[id number] DOB: 1946/01/05 DOA: 12/09/2022  PCP: Sandford Craze, NP  Admit date: 12/09/2022 Discharge date: 12/11/2022  Admitted From: Home Disposition:  Home  Discharge Condition:Stable CODE STATUS:FULL Diet recommendation:  Carb Modified   Brief/Interim Summary:  Patient is a 77 year old male with history of hyperlipidemia, hypertension, coronary artery disease, idiopathic pulmonary fibrosis, diabetes type 2 who presented to the emergency department with complaint of nausea, vomiting after starting a new drug while being currently enrolled in clinical study for pulmonary fibrosis.  Also missed some doses of insulin.  On presentation lab work showed phosphorus 1.4, CO2 15, anion gap 19, glucose of 264.  CT abdomen/pelvis showed circumferential wall thickening of the decompressed cecum, ascending colon hepatic flexure suggestive of segmental colitis.  Patient was started on broad spectrum  antibiotics for colitis , IV insulin for DKA .  Gap closed, insulin has been transitioned to long-acting and sliding scale.  Nausea and vomiting, significantly improved, tolerated soft diet today.  Medically stable for discharge to home.  Following problems were addressed during the hospitalization:  DKA: History of type 2 diabetes:.  A1c of 7.3.  Started on DKA protocol. On presentation ,lab work showed phosphorus 1.4, CO2 15, anion gap 19, glucose of 264. Started on long-acting and sliding scale.  Diabetic coordinator  was following.  He will continue his home regimen on discharge.   Colitis: CT scan showed Circumferential wall thickening of a decompressed cecum,ascending colon and hepatic flexure. Some wall thickening may also involve the terminal ileum.  Patient denies any abdomen pain,  or vomiting this morning.  Abdomen is soft, nondistended , nontender on examination.  GI pathogen panel not resulted yet.  Continue Augmentin for 7 days.  He needs to  follow-up with GI as an outpatient in 6 to 8 weeks for colonoscopy   Hypokalemia/hypophosphatemia: Currently being supplemented    Hypertension: Takes metoprolol at home, resumed   Chronic diastolic CHF: Currently euvolemic   Hyperlipidemia: Takes statin at home.   GERD: Continue PPI   Pulmonary fibrosis: Recently started on a clinical trial.On TTI-101  Follows with Dr. Marchelle Gearing   Paroxysmal A-fib:Takes metoprolol for rate control, on Eliquis for anticoagulation.  Follows with Dr. Tenny Craw.Monitor on tele.  Remains in A-fib this morning but rate is controlled     Discharge Diagnoses:  Principal Problem:   Metabolic acidosis Active Problems:   HTN (hypertension)   Chronic diastolic CHF (congestive heart failure) (HCC)   Hyperlipidemia   GERD (gastroesophageal reflux disease)   Hyperbilirubinemia   Hypophosphatemia   DKA (diabetic ketoacidosis) (HCC)   Starvation ketoacidosis   Borderline prolonged QT interval    Discharge Instructions  Discharge Instructions     Diet Carb Modified   Complete by: As directed    Discharge instructions   Complete by: As directed    1)Please take prescribed medications as instructed 2)Follow up with your PCP in a week. Do a BMP and phosphorous check during the follow up.Get a referral for colonoscopy in 6 to 8 weeks 3) Follow-up with your endocrinologist and pulmologist 4) monitor your  blood sugars at home   Increase activity slowly   Complete by: As directed       Allergies as of 12/11/2022       Reactions   Other Other (See Comments)   Client states he became very sick and had significant skin inflammation and irritation with the Freestyle Libre sensor   Lisinopril    Cough  Medication List     TAKE these medications    alendronate 70 MG tablet Commonly known as: FOSAMAX Take 1 tablet (70 mg total) by mouth every 7 (seven) days. Take with a full glass of water on an empty stomach.   amoxicillin-clavulanate 875-125  MG tablet Commonly known as: AUGMENTIN Take 1 tablet by mouth every 12 (twelve) hours for 7 days.   atorvastatin 40 MG tablet Commonly known as: LIPITOR TAKE 1 TABLET (40 MG TOTAL) BY MOUTH DAILY.   Calcium Carbonate-Vitamin D 600-400 MG-UNIT tablet Take 1 tablet by mouth 2 (two) times daily. Reported on 01/27/2016   cholecalciferol 25 MCG (1000 UNIT) tablet Commonly known as: VITAMIN D3 Take 1,000 Units by mouth daily.   Eliquis 5 MG Tabs tablet Generic drug: apixaban Take 1 tablet (5 mg total) by mouth 2 (two) times daily.   Glucagon Emergency 1 MG Kit Inject 1 mg into the muscle once as needed for up to 1 dose. What changed:  when to take this reasons to take this   insulin aspart 100 UNIT/ML injection Commonly known as: novoLOG Inject 2-3 Units into the skin 3 (three) times daily before meals.   Investigational - Study Medication Take 1 tablet by mouth daily. TTI-101   metoprolol succinate 25 MG 24 hr tablet Commonly known as: TOPROL-XL TAKE 1 TABLET (25 MG TOTAL) BY MOUTH DAILY. What changed: how much to take   ondansetron 4 MG disintegrating tablet Commonly known as: ZOFRAN-ODT Take 1 tablet (4 mg total) by mouth every 8 (eight) hours as needed for nausea or vomiting.   pantoprazole 40 MG tablet Commonly known as: Protonix Take 1 tablet (40 mg total) by mouth daily.   phosphorus 155-852-130 MG tablet Commonly known as: K PHOS NEUTRAL Take 2 tablets (500 mg total) by mouth 3 (three) times daily for 3 days.   Evaristo Bury FlexTouch 100 UNIT/ML FlexTouch Pen Generic drug: insulin degludec Inject 8 Units into the skin daily. Will start in 2024 after he completes current supply of Levemir. What changed: additional instructions   TRUEplus Insulin Syringe 31G X 5/16" 0.5 ML Misc Generic drug: Insulin Syringe-Needle U-100 USE TO INJECT INSULIN 4 TIMES A DAY        Follow-up Information     Sandford Craze, NP. Schedule an appointment as soon as possible for  a visit in 1 week(s).   Specialty: Internal Medicine Contact information: [address] [place] [address] [phone]                Allergies  Allergen Reactions   Other Other (See Comments)    Client states he became very sick and had significant skin inflammation and irritation with the Freestyle Libre sensor   Lisinopril     Cough     Consultations: None   Procedures/Studies: CT ABDOMEN PELVIS W CONTRAST  Result Date: 12/09/2022 CLINICAL DATA:  Abdominal pain, nausea, vomiting and diarrhea. EXAM: CT ABDOMEN AND PELVIS WITH CONTRAST TECHNIQUE: Multidetector CT imaging of the abdomen and pelvis was performed using the standard protocol following bolus administration of intravenous contrast. RADIATION DOSE REDUCTION: This exam was performed according to the departmental dose-optimization program which includes automated exposure control, adjustment of the mA and/or kV according to patient size and/or use of iterative reconstruction technique. CONTRAST:  OMNIPAQUE IOHEXOL 300 MG/ML  SOLN COMPARISON:  CT of the chest on 11/29/2022 FINDINGS: Lower chest: Stable appearance of chronic pulmonary fibrosis affecting the peripheral lung zones at the visualized  lung bases. Hepatobiliary: Scattered calcified granulomata in the liver. No solid hepatic masses. No gallstones, gallbladder wall thickening, or biliary dilatation. Pancreas: Unremarkable. No pancreatic ductal dilatation or surrounding inflammatory changes. Spleen: Multiple punctate calcified granulomata within a normal-sized spleen. Adrenals/Urinary Tract: Adrenal glands are unremarkable. Kidneys are normal, without renal calculi, focal lesion, or hydronephrosis. Bladder is unremarkable. Stomach/Bowel: There is some circumferential wall thickening of a decompressed cecum, ascending colon and hepatic flexure. Some wall thickening may also involve the terminal ileum. Findings are suggestive of segmental colitis  and also potentially ileitis of the terminal ileum. No associated evidence to suggest bowel perforation, obstruction or focal abscess. No free intraperitoneal air. Diverticulosis of the sigmoid and descending colon without evidence of diverticulitis. Vascular/Lymphatic: Atherosclerosis of the abdominal aorta and iliac arteries without evidence of aneurysm. No lymphadenopathy identified in the abdomen or pelvis. Reproductive: Prostate is unremarkable. Other: No abdominal wall hernia or abnormality. No abdominopelvic ascites. Musculoskeletal: Lumbar degenerative disc disease, most prominently at L5-S1. IMPRESSION: 1. Circumferential wall thickening of a decompressed cecum, ascending colon and hepatic flexure. Some wall thickening may also involve the terminal ileum. Findings are suggestive of segmental colitis and also potentially ileitis of the terminal ileum. No evidence to suggest bowel perforation, obstruction or focal abscess. 2. Stable appearance of chronic pulmonary fibrosis at the visualized lung bases. 3. Evidence of prior granulomatous disease with calcified granulomata in the liver and spleen. 4. Atherosclerosis of the abdominal aorta and iliac arteries without evidence of aneurysm. Aortic Atherosclerosis (ICD10-I70.0). Electronically Signed   By: Irish Lack M.D.   On: 12/09/2022 11:11   DG Chest Portable 1 View  Result Date: 12/07/2022 CLINICAL DATA:  Cough, nausea, vomiting, diarrhea, and fatigue. EXAM: PORTABLE CHEST 1 VIEW COMPARISON:  Chest radiographs 10/27/2021 and CT 11/29/2022 FINDINGS: The cardiomediastinal silhouette is unchanged with normal heart size. Lung volumes are lower than on the prior radiographs, and there is slight elevation of the right hemidiaphragm. Coarse interstitial densities in both lungs are similar to the prior radiographs. No acute airspace consolidation, sizeable pleural effusion, or pneumothorax is identified. No acute osseous abnormality is seen. IMPRESSION:  Chronic interstitial lung disease without evidence of acute cardiopulmonary process. Electronically Signed   By: Sebastian Ache M.D.   On: 12/07/2022 19:09   CT Head Wo Contrast  Result Date: 12/07/2022 CLINICAL DATA:  Head trauma. Fall 1 week ago. Nausea and vomiting and headache. EXAM: CT HEAD WITHOUT CONTRAST TECHNIQUE: Contiguous axial images were obtained from the base of the skull through the vertex without intravenous contrast. RADIATION DOSE REDUCTION: This exam was performed according to the departmental dose-optimization program which includes automated exposure control, adjustment of the mA and/or kV according to patient size and/or use of iterative reconstruction technique. COMPARISON:  Head CT 08/06/2020 FINDINGS: Brain: There is no evidence of an acute infarct, intracranial hemorrhage, mass, midline shift, or extra-axial fluid collection. The ventricles and sulci are within normal limits for age. Periventricular white matter hypodensities are nonspecific but compatible with minimal chronic small vessel ischemic disease. Vascular: Calcified atherosclerosis at the skull base. No hyperdense vessel. Skull: No acute fracture or suspicious osseous lesion. Sinuses/Orbits: Minimal fluid in the right sphenoid sinus. Mild chronic left ethmoid air cell opacification. Clear mastoid air cells. Bilateral cataract extraction. Other: None. IMPRESSION: No evidence of acute intracranial abnormality. Electronically Signed   By: Sebastian Ache M.D.   On: 12/07/2022 19:06   CT Chest High Resolution  Result Date: 12/01/2022 CLINICAL DATA:  Research study protocol EXAM: CT CHEST  WITHOUT CONTRAST TECHNIQUE: Multidetector CT imaging of the chest was performed following the standard protocol without intravenous contrast. High resolution imaging of the lungs, as well as inspiratory and expiratory imaging, was performed. RADIATION DOSE REDUCTION: This exam was performed according to the departmental dose-optimization program  which includes automated exposure control, adjustment of the mA and/or kV according to patient size and/or use of iterative reconstruction technique. COMPARISON:  High-resolution chest CT dated September 21, 2022 FINDINGS: Cardiovascular: Cardiomegaly. No pericardial effusion. Normal caliber thoracic aorta with mild calcified plaque. Severe coronary artery calcifications. Mediastinum/Nodes: Esophagus and thyroid are unremarkable. Prominent subcentimeter mediastinal lymph nodes, unchanged when compared with the prior and likely reactive. Calcified mediastinal and right hilar lymph nodes, likely sequela prior granulomatous infection. Lungs/Pleura: Central airways are patent. Subpleural and basilar predominant reticular opacities with traction bronchiectasis and mild honeycomb change. No evidence of progression when compared with February 13th 2024 prior. No consolidation, pleural effusion or pneumothorax. Upper Abdomen: No acute abnormality. Musculoskeletal: No chest wall mass or suspicious bone lesions identified. IMPRESSION: 1. Stable fibrotic interstitial lung disease. Findings are consistent with UIP per consensus guidelines: Diagnosis of Idiopathic Pulmonary Fibrosis: An Official ATS/ERS/JRS/ALAT Clinical Practice Guideline. Am Rosezetta Schlatter Crit Care Med Vol 198, Iss 5, [id number], Apr 09 2017. 2. Coronary artery calcifications and aortic Atherosclerosis (ICD10-I70.0). Electronically Signed   By: Allegra Lai M.D.   On: 12/01/2022 16:17      Subjective: Patient seen and examined at bedside today.  Hemodynamically stable.  Comfortable.  Medically stable for discharge home.  No abdominal pain, nausea or vomiting.  Discharge Exam: Vitals:   12/11/22 0811 12/11/22 0900  BP:  139/64  Pulse:  (!) 55  Resp:  (!) 23  Temp: 97.9 F (36.6 C)   SpO2:  100%   Vitals:   12/11/22 0400 12/11/22 0500 12/11/22 0811 12/11/22 0900  BP: (!) 172/88 125/62  139/64  Pulse: 65 (!) 57  (!) 55  Resp: (!) 26 (!) 22  (!)  23  Temp: 98.2 F (36.8 C)  97.9 F (36.6 C)   TempSrc: Oral  Oral   SpO2: 100% 96%  100%  Weight:      Height:        General: Pt is alert, awake, not in acute distress Cardiovascular: RRR, S1/S2 +, no rubs, no gallops Respiratory: CTA bilaterally, no wheezing, no rhonchi Abdominal: Soft, NT, ND, bowel sounds + Extremities: no edema, no cyanosis    The results of significant diagnostics from this hospitalization (including imaging, microbiology, ancillary and laboratory) are listed below for reference.     Microbiology: Recent Results (from the past 240 hour(s))  Blood culture (routine x 2)     Status: None (Preliminary result)   Collection Time: 12/07/22  5:34 PM   Specimen: BLOOD  Result Value Ref Range Status   Specimen Description   Final    BLOOD BLOOD RIGHT HAND Performed at [facility], [address] [place], [place] [address]    Special Requests   Final    BOTTLES DRAWN AEROBIC AND ANAEROBIC Blood Culture adequate volume Performed at [facility], [address] [place], [place] [address]    Culture   Final    NO GROWTH 3 DAYS Performed at [facility], [address] [place], [place] [address]    Report Status PENDING  Incomplete  Blood culture (routine x 2)     Status: None (Preliminary result)   Collection Time: 12/07/22  6:00 PM   Specimen: BLOOD  Result Value Ref Range Status   Specimen Description   Final    BLOOD RIGHT ANTECUBITAL Performed at [facility], [address] [place], [place] [address]    Special Requests   Final    BOTTLES DRAWN AEROBIC AND ANAEROBIC Blood Culture adequate volume Performed at [facility], [address] [place], [place] [address]    Culture   Final    NO GROWTH 3 DAYS Performed at [facility], [address] [place], [place] [address]    Report Status PENDING  Incomplete     Labs: BNP (last 3 results) No results for input(s): "BNP" in the last  8760 hours. Basic Metabolic Panel: Recent Labs  Lab 12/09/22 0813 12/09/22 0814 12/09/22 1616 12/09/22 2101 12/10/22 0059 12/10/22 0306 12/10/22 0905 12/11/22 0236  NA  --    < > 136 133* 135 134*  --  132*  K  --    < > 3.6 3.9 3.3* 3.1*  --  3.6  CL  --    < > 108 106 106 105  --  99  CO2  --    < > 15* 16* 20* 19*  --  19*  GLUCOSE  --    < > 160* 170* 120* 185*  --  250*  BUN  --    < > 16 14 12 12   --  8  CREATININE  --    < > 0.80 0.74 0.71 0.75  --  0.73  CALCIUM  --    < > 7.6* 7.4* 7.5* 7.4*  --  7.6*  MG 2.4  --   --   --   --   --  2.2  --   PHOS 1.4*  --   --   --   --   --  1.4* 1.7*   < > = values in this interval not displayed.   Liver Function Tests: Recent Labs  Lab 12/07/22 1745 12/09/22 0814 12/10/22 0306 12/11/22 0236  AST 24 29 16 16   ALT 16 21 16 16   ALKPHOS 79 69 54 56  BILITOT 1.1 2.0* 1.5* 1.3*  PROT 8.4* 7.5 5.9* 5.8*  ALBUMIN 3.8 3.4* 2.6* 2.6*   Recent Labs  Lab 12/07/22 1745 12/09/22 0814  LIPASE 86* 41   No results for input(s): "AMMONIA" in the last 168 hours. CBC: Recent Labs  Lab 12/07/22 1604 12/07/22 1807 12/09/22 0814 12/10/22 0306 12/11/22 0236  WBC 16.2*  --  12.7* 9.5 8.3  HGB 14.8 15.3 14.2 12.2* 12.5*  HCT 45.6 45.0 43.9 35.9* 37.1*  MCV 91.6  --  92.4 89.5 89.8  PLT 339  --  327 253 237   Cardiac Enzymes: No results for input(s): "CKTOTAL", "CKMB", "CKMBINDEX", "TROPONINI" in the last 168 hours. BNP: Invalid input(s): "POCBNP" CBG: Recent Labs  Lab 12/10/22 0841 12/10/22 1108 12/10/22 1615 12/10/22 1659 12/10/22 2157  GLUCAP 141* 132* 125* 162* 182*   D-Dimer No results for input(s): "DDIMER" in the last 72 hours. Hgb A1c No results for input(s): "HGBA1C" in the last 72 hours. Lipid Profile No results for input(s): "CHOL", "HDL", "LDLCALC", "TRIG", "CHOLHDL", "LDLDIRECT" in the last 72 hours. Thyroid function studies No results for input(s): "TSH", "T4TOTAL", "T3FREE", "THYROIDAB" in the last 72  hours.  Invalid input(s): "FREET3" Anemia work up No results for input(s): "VITAMINB12", "FOLATE", "FERRITIN", "TIBC", "IRON", "RETICCTPCT" in the last 72 hours. Urinalysis  Component Value Date/Time   COLORURINE YELLOW 12/09/2022 0813   APPEARANCEUR CLEAR 12/09/2022 0813   LABSPEC 1.031 (H) 12/09/2022 0813   PHURINE 5.0 12/09/2022 0813   GLUCOSEU >=500 (A) 12/09/2022 0813   GLUCOSEU NEGATIVE 08/10/2017 1219   HGBUR SMALL (A) 12/09/2022 0813   BILIRUBINUR NEGATIVE 12/09/2022 0813   KETONESUR 80 (A) 12/09/2022 0813   PROTEINUR 30 (A) 12/09/2022 0813   UROBILINOGEN 0.2 08/10/2017 1219   NITRITE NEGATIVE 12/09/2022 0813   LEUKOCYTESUR NEGATIVE 12/09/2022 0813   Sepsis Labs Recent Labs  Lab 12/07/22 1604 12/09/22 0814 12/10/22 0306 12/11/22 0236  WBC 16.2* 12.7* 9.5 8.3   Microbiology Recent Results (from the past 240 hour(s))  Blood culture (routine x 2)     Status: None (Preliminary result)   Collection Time: 12/07/22  5:34 PM   Specimen: BLOOD  Result Value Ref Range Status   Specimen Description   Final    BLOOD BLOOD RIGHT HAND Performed at [facility], [address] [place], [place] [address]    Special Requests   Final    BOTTLES DRAWN AEROBIC AND ANAEROBIC Blood Culture adequate volume Performed at [facility], [address] [place], [place] [address]    Culture   Final    NO GROWTH 3 DAYS Performed at [facility], [address], [place], [place] [address]    Report Status PENDING  Incomplete  Blood culture (routine x 2)     Status: None (Preliminary result)   Collection Time: 12/07/22  6:00 PM   Specimen: BLOOD  Result Value Ref Range Status   Specimen Description   Final    BLOOD RIGHT ANTECUBITAL Performed at [facility], [address] [place], [place] [address]    Special Requests   Final    BOTTLES DRAWN AEROBIC AND ANAEROBIC Blood Culture adequate volume Performed at [facility], [address] [place], [place] [address]    Culture   Final    NO GROWTH 3 DAYS Performed at [facility], [address] [place], [place] [address]    Report Status PENDING  Incomplete    Please note: You were cared for by a hospitalist during your hospital stay. Once you are discharged, your primary care physician will handle any further medical issues. Please note that NO REFILLS for any discharge medications will be authorized once you are discharged, as it is imperative that you return to your primary care physician (or establish a relationship with a primary care physician if you do not have one) for your post hospital discharge needs so that they can reassess your need for medications and monitor your lab values.    Time coordinating discharge: 40 minutes  SIGNED:   Burnadette Pop, MD  [facility] 12/11/2022, 10:36 AM Pager [phone]  If 7PM-7AM, please contact night-coverage [email or web address] Password [email or web address]

## 2022-12-11 NOTE — Progress Notes (Signed)
  Transition of Care [facility]) Screening Note   Patient Details  Name: Ryan [name] Date of Birth: 1946-06-25   Transition of Care [facility]) CM/SW Contact:    Adrian Prows, RN Phone Number: 12/11/2022, 12:52 PM    Transition of Care Department [facility]) has reviewed patient and no TOC needs have been identified at this time. We will continue to monitor patient advancement through interdisciplinary progression rounds. If new patient transition needs arise, please place a TOC consult.

## 2022-12-12 LAB — CULTURE, BLOOD (ROUTINE X 2)

## 2022-12-13 ENCOUNTER — Telehealth: Payer: Self-pay | Admitting: Internal Medicine

## 2022-12-13 ENCOUNTER — Telehealth: Payer: Self-pay | Admitting: *Deleted

## 2022-12-13 ENCOUNTER — Encounter: Payer: Self-pay | Admitting: *Deleted

## 2022-12-13 ENCOUNTER — Other Ambulatory Visit (HOSPITAL_BASED_OUTPATIENT_CLINIC_OR_DEPARTMENT_OTHER): Payer: Self-pay

## 2022-12-13 ENCOUNTER — Ambulatory Visit: Payer: PPO | Admitting: Student

## 2022-12-13 DIAGNOSIS — J84112 Idiopathic pulmonary fibrosis: Secondary | ICD-10-CM

## 2022-12-13 DIAGNOSIS — K529 Noninfective gastroenteritis and colitis, unspecified: Secondary | ICD-10-CM

## 2022-12-13 DIAGNOSIS — E1310 Other specified diabetes mellitus with ketoacidosis without coma: Secondary | ICD-10-CM

## 2022-12-13 LAB — CULTURE, BLOOD (ROUTINE X 2): Culture: NO GROWTH

## 2022-12-13 NOTE — Progress Notes (Deleted)
Synopsis: Referred for *** by Sandford Craze, NP  Subjective:   PATIENT ID: Ryan [name] GENDER: male DOB: December 01, 1945, MRN: [id number]  No chief complaint on file.  76yM history of DM2, AF, IPF f/b Dr. 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 Elected to proceed with trial instead.   Otherwise pertinent review of systems is negative.  Past Medical History:  Diagnosis Date   Allergy    Arthritis    Diabetes mellitus    GERD (gastroesophageal reflux disease)    History of chicken pox    Hyperlipidemia    Hypertension    IPF (idiopathic pulmonary fibrosis) (HCC)    Myocardial infarction (HCC) 12/31/2015   Osteoporosis    Type 2 diabetes mellitus with hyperglycemia, with long-term current use of insulin (HCC) 09/08/2015     Family History  Problem Relation Age of Onset   Cancer Mother        cancer   Heart disease Mother    Hyperlipidemia Mother    Hypertension Mother    Hyperlipidemia Father    Stroke Father    Hypertension Father    Diabetes Paternal Grandfather    Colon cancer Neg Hx    Stomach cancer Neg Hx    Esophageal cancer Neg Hx      Past Surgical History:  Procedure Laterality Date   EYE SURGERY  1998   bilateral   SHOULDER SURGERY  1990   right shoulder, torn rotator cuff.    Social History   Socioeconomic History   Marital status: Widowed    Spouse name: Not on file   Number of children: 1   Years of education: Not on file   Highest education level: Not on file  Occupational History   Occupation: retired    Comment: from retail  Tobacco Use   Smoking status: Former    Types: Cigarettes    Quit date: 09/01/1965    Years since quitting: 57.3   Smokeless tobacco: Never   Tobacco comments:    Smoked about 1cig a week for  about 6 months.  Vaping Use   Vaping Use: Never used  Substance and Sexual Activity   Alcohol use: No   Drug use: No   Sexual activity: Never  Other Topics Concern   Not on file  Social History Narrative   Regular exercise:  5 x weekly   Caffeine Use: 1 cups coffee daily.   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   Son, daughter in law and young adult granddaughter and grandson live with client   Wife died from ovarian cancer in Dec 31, 2014         Social Determinants of Health   Financial Resource Strain: Low Risk  (02/08/2022)   Overall Financial Resource Strain (CARDIA)    Difficulty of Paying Living Expenses: Not hard at all  Food Insecurity: No Food Insecurity (12/09/2022)   Hunger Vital Sign    Worried About Running Out of Food in the Last Year: Never true    Ran Out of Food in the Last Year: Never true  Transportation Needs: No Transportation Needs (12/09/2022)   PRAPARE - Administrator, Civil Service (Medical): No    Lack of Transportation (Non-Medical): No  Physical Activity: Sufficiently Active (02/08/2022)   Exercise Vital Sign  Days of Exercise per Week: 7 days    Minutes of Exercise per Session: 30 min  Stress: No Stress Concern Present (02/08/2022)   [employer] of Occupational Health - Occupational Stress Questionnaire    Feeling of Stress : Not at all  Social Connections: Socially Isolated (02/02/2021)   Social Connection and Isolation Panel [NHANES]    Frequency of Communication with Friends and Family: More than three times a week    Frequency of Social Gatherings with Friends and Family: Once a week    Attends Religious Services: Never    Database administrator or Organizations: No    Attends Banker Meetings: Never    Marital Status: Widowed  Intimate Partner Violence: Not At Risk (12/09/2022)   Humiliation, Afraid, Rape, and Kick questionnaire    Fear of Current or Ex-Partner: No    Emotionally Abused: No    Physically Abused: No     Sexually Abused: No     Allergies  Allergen Reactions   Other Other (See Comments)    Client states he became very sick and had significant skin inflammation and irritation with the Freestyle Libre sensor   Lisinopril     Cough      Outpatient Medications Prior to Visit  Medication Sig Dispense Refill   alendronate (FOSAMAX) 70 MG tablet Take 1 tablet (70 mg total) by mouth every 7 (seven) days. Take with a full glass of water on an empty stomach. 12 tablet 4   amoxicillin-clavulanate (AUGMENTIN) 875-125 MG tablet Take 1 tablet by mouth every 12 (twelve) hours for 7 days. 14 tablet 0   apixaban (ELIQUIS) 5 MG TABS tablet Take 1 tablet (5 mg total) by mouth 2 (two) times daily. 60 tablet 5   atorvastatin (LIPITOR) 40 MG tablet TAKE 1 TABLET (40 MG TOTAL) BY MOUTH DAILY. 90 tablet 1   Calcium Carbonate-Vitamin D 600-400 MG-UNIT tablet Take 1 tablet by mouth 2 (two) times daily. Reported on 01/27/2016     cholecalciferol (VITAMIN D3) 25 MCG (1000 UNIT) tablet Take 1,000 Units by mouth daily.     Glucagon, rDNA, (GLUCAGON EMERGENCY) 1 MG KIT Inject 1 mg into the muscle once as needed for up to 1 dose. (Patient taking differently: Inject 1 mg into the muscle as needed (blood sugar below 45).) 1 kit 12   insulin aspart (NOVOLOG) 100 UNIT/ML injection Inject 2-3 Units into the skin 3 (three) times daily before meals. 10 mL PRN   insulin degludec (TRESIBA FLEXTOUCH) 100 UNIT/ML FlexTouch Pen Inject 8 Units into the skin daily. Will start in 2024 after he completes current supply of Levemir. (Patient taking differently: Inject 8 Units into the skin daily.) 15 mL 0   Insulin Syringe-Needle U-100 (TRUEPLUS INSULIN SYRINGE) 31G X 5/16" 0.5 ML MISC USE TO INJECT INSULIN 4 TIMES A DAY 300 each 3   Investigational - Study Medication Take 1 tablet by mouth daily. TTI-101 (Patient not taking: Reported on 12/10/2022)     metoprolol succinate (TOPROL-XL) 25 MG 24 hr tablet TAKE 1 TABLET (25 MG TOTAL) BY MOUTH  DAILY. (Patient taking differently: Take 25 mg by mouth daily.) 90 tablet 1   ondansetron (ZOFRAN-ODT) 4 MG disintegrating tablet Take 1 tablet (4 mg total) by mouth every 8 (eight) hours as needed for nausea or vomiting. 20 tablet 0   pantoprazole (PROTONIX) 40 MG tablet Take 1 tablet (40 mg total) by mouth daily. 14 tablet 0   phosphorus (K PHOS NEUTRAL) 155-852-130 MG tablet  Take 2 tablets (500 mg total) by mouth 3 (three) times daily for 3 days. 18 tablet 0   No facility-administered medications prior to visit.       Objective:   Physical Exam:  General appearance: 77 y.o., male, NAD, conversant  Eyes: anicteric sclerae; PERRL, tracking appropriately HENT: NCAT; MMM Neck: Trachea midline; no lymphadenopathy, no JVD Lungs: CTAB, no crackles, no wheeze, with normal respiratory effort CV: RRR, no murmur  Abdomen: Soft, non-tender; non-distended, BS present  Extremities: No peripheral edema, warm Skin: Normal turgor and texture; no rash Psych: Appropriate affect Neuro: Alert and oriented to person and place, no focal deficit     There were no vitals filed for this visit.   on *** LPM *** RA BMI Readings from Last 3 Encounters:  12/09/22 23.26 kg/m  12/07/22 23.42 kg/m  10/29/22 25.08 kg/m   Wt Readings from Last 3 Encounters:  12/09/22 153 lb (69.4 kg)  12/07/22 154 lb (69.9 kg)  10/29/22 169 lb 12.8 oz (77 kg)     CBC    Component Value Date/Time   WBC 8.3 12/11/2022 0236   RBC 4.13 (L) 12/11/2022 0236   HGB 12.5 (L) 12/11/2022 0236   HGB 14.6 09/08/2020 1656   HCT 37.1 (L) 12/11/2022 0236   HCT 43.4 09/08/2020 1656   PLT 237 12/11/2022 0236   PLT 255 09/08/2020 1656   MCV 89.8 12/11/2022 0236   MCV 90 09/08/2020 1656   MCH 30.3 12/11/2022 0236   MCHC 33.7 12/11/2022 0236   RDW 13.5 12/11/2022 0236   RDW 12.3 09/08/2020 1656   LYMPHSABS 1.8 11/03/2021 0922   LYMPHSABS 1.8 10/06/2017 1203   MONOABS 1.1 (H) 11/03/2021 0922   EOSABS 0.3 11/03/2021 0922    EOSABS 0.2 10/06/2017 1203   BASOSABS 0.0 11/03/2021 0922   BASOSABS 0.0 10/06/2017 1203    ***  Chest Imaging: ***  Pulmonary Functions Testing Results:    Latest Ref Rng & Units 06/22/2022    8:33 AM  PFT Results  FVC-Pre L 3.47   FVC-Predicted Pre % 89   FVC-Post L 3.51   FVC-Predicted Post % 90   Pre FEV1/FVC % % 85   Post FEV1/FCV % % 87   FEV1-Pre L 2.95   FEV1-Predicted Pre % 106   FEV1-Post L 3.06   DLCO uncorrected ml/min/mmHg 16.65   DLCO UNC% % 70   DLCO corrected ml/min/mmHg 16.65   DLCO COR %Predicted % 70   DLVA Predicted % 87   TLC L 5.18   TLC % Predicted % 77   RV % Predicted % 65     FeNO: ***  Pathology: ***  Echocardiogram: ***  Heart Catheterization: ***    Assessment & Plan:    Plan:      Omar Person, MD [facility] 12/13/2022 12:19 PM

## 2022-12-13 NOTE — Telephone Encounter (Signed)
PI note  = Discussed with the research coordinator.  She made an appointment for patient to be seen acutely as a standard of care visit because he was still having diarrhea postdischarge.  His son wants to bring him but despite many conversations with him from the son and from the research coordinator he refused to go for the visit.  And therefore however he did not show up for the appointment 12/13/2022   She called him back again and has insisted that he come back.  There is a research appointment 12/14/22. \   SIGNATURE    Dr. Kalman Shan, M.D., F.C.C.P,  Pulmonary and Critical Care Medicine Staff Physician, [facility] Center Director - Interstitial Lung Disease  Program  Medical Director - [facility] Spore Long ICU Pulmonary Fibrosis [facility] Network at [facility] [place], [place], [address]   Pager: [phone], If no answer  -> Check AMION or Try [phone] Telephone (clinical office): [phone] Telephone (research): [phone]  2:07 PM 12/13/2022

## 2022-12-13 NOTE — Telephone Encounter (Signed)
Patient is scheduled to see Dr Thora Lance today at 1:15.

## 2022-12-13 NOTE — Telephone Encounter (Cosign Needed)
Called Mr. Sadberry back and spoke to son encouraging him to make clinic appointment today.  Patient refusing due to weakness and exhaustion but agrees to come for appointment tomorrow May 7 10 AM. Advised to bring his father is able to convince him and to call if worsens.

## 2022-12-13 NOTE — Telephone Encounter (Signed)
Telephone call to Ryan [name] to follow up after hospital discharge 12/11/2022.  Spoke with patient's son who said Ryan [name] remains weak, a lot of diarrhea but no vomiting. Diarrhea 3 times today so far, 3 times during night.  Drinking ok but not eating.  Discussed with Dr. Marchelle Gearing who recommended patient be seen today.  Call [facility] and scheduled appointment with Dr. Caryn Bee 1:15 pm today.  Son willing to bring him but patient refused due to feeling weak and exhausted and not cleaned up.  Encouraged son to get him seen today but he reports patient cursed and adamantly refused today.  Agrees to keep appointment tomorrow.  Son will discuss again but does not think he can convince him. Son agrees to call if worsens and to bring to appointment tomorrow.

## 2022-12-13 NOTE — Telephone Encounter (Signed)
Patient Ryan [name] discharged from hospital over weekend due to colitis and dka. Has IPF. Was on study drug. Tomorrow 12/14/22 has research visit but needs to bee seen 12/13/2022 TODAY PM by APP - because he has diarrhea. And stil feeling weak

## 2022-12-13 NOTE — Transitions of Care (Post Inpatient/ED Visit) (Signed)
12/13/2022  Name: Ryan [name] MRN: [id number] DOB: [birth year]  Today's TOC FU Call Status: Today's TOC FU Call Status:: Successful TOC FU Call Competed TOC FU Call Complete Date: 12/13/22  Transition Care Management Follow-up Telephone Call Date of Discharge: 12/11/22 Discharge Facility: [facility]) Type of Discharge: Inpatient Admission Primary Inpatient Discharge Diagnosis:: metabolic acidosis/ nausea- vomiting in setting of study drug; pulmonary fibrosis How have you been since you were released from the hospital?: Same (son Jonny Ruiz: "He's hanging in there; but still having nausea and diarrhea.  He's resting in bed right now.  He is tired.  They took him off of the study drug, and he is still getting his medications through the program they set up for him awhile ago") Any questions or concerns?: Yes Patient Questions/Concerns:: per son Jonny Ruiz: "He is in a program for awhile now where they provide his medicine for him.... at first it was great, but then they started sending less and less insulin.... I am still waiting on one of his insulin types to be refilled and sent, but they have told me they would get them out to [place] soon.  I do not know the name of thisd program.  I have to call them all the time to make sure he has what he needs... he is not out of the insulin, he just needs more" Patient Questions/Concerns Addressed: Other: (encouraged son to continue to stay vigilant with follow up on insulin issue- son not able to tell me during [facility] call WHICH type of insulihn there is an issue with- he is not near medications; expecting insulin to be sent "soon")  Items Reviewed: Did you receive and understand the discharge instructions provided?: Yes (thoroughly reviewed with patient who verbalizes good understanding of same) Medications obtained,verified, and reconciled?: Yes (Medications Reviewed) (Full medication reconciliation/ review completed; no concerns or discrepancies identified;  confirmed patient obtained/ is taking all newly Rx'd medications as instructed; family-manages medications and denies questions/ concerns around medications today) Any new allergies since your discharge?: No Dietary orders reviewed?: Yes Type of Diet Ordered:: "Bland" Do you have support at home?: Yes People in Home: child(ren), adult Name of Support/Comfort Primary Source: Caregiver/ son Jonny Ruiz reports patient is essentially independent in self-care activities at baseline; son and daughter-in-law assists as/ if needed/ indicated- reports has needed more assistance after recent hospitalization  Medications Reviewed Today: Medications Reviewed Today     Reviewed by Michaela Corner, RN (Registered Nurse) on 12/13/22 at 1556  Med List Status: <None>   Medication Order Taking? Sig Documenting Provider Last Dose Status Informant  alendronate (FOSAMAX) 70 MG tablet [id number]  Take 1 tablet (70 mg total) by mouth every 7 (seven) days. Take with a full glass of water on an empty stomach. Sandford Craze, NP  Active Self, Pharmacy Records  amoxicillin-clavulanate (AUGMENTIN) 875-125 MG tablet [id number]  Take 1 tablet by mouth every 12 (twelve) hours for 7 days. Burnadette Pop, MD  Active   apixaban (ELIQUIS) 5 MG TABS tablet [id number]  Take 1 tablet (5 mg total) by mouth 2 (two) times daily. Pricilla Riffle, MD  Active Self, Pharmacy Records  atorvastatin (LIPITOR) 40 MG tablet [id number]  TAKE 1 TABLET (40 MG TOTAL) BY MOUTH DAILY. Sandford Craze, NP  Active Self, Pharmacy Records  Calcium Carbonate-Vitamin D 600-400 MG-UNIT tablet [id number]  Take 1 tablet by mouth 2 (two) times daily. Reported on 01/27/2016 [provider]  Active Self, Pharmacy Records  cholecalciferol (VITAMIN D3) 25 MCG (1000  UNIT) tablet [id number]  Take 1,000 Units by mouth daily. [provider]  Active Self, Pharmacy Records  Glucagon, rDNA, (GLUCAGON EMERGENCY) 1 MG KIT [id number]  Inject 1 mg into the muscle  once as needed for up to 1 dose.  Patient taking differently: Inject 1 mg into the muscle as needed (blood sugar below 45).   Carlus Pavlov, MD  Active Self, Pharmacy Records  insulin aspart (NOVOLOG) 100 UNIT/ML injection [id number]  Inject 2-3 Units into the skin 3 (three) times daily before meals. Carlus Pavlov, MD  Active Self, Pharmacy Records  insulin degludec [facility]) 100 UNIT/ML FlexTouch Pen [id number]  Inject 8 Units into the skin daily. Will start in 2024 after he completes current supply of Levemir.  Patient taking differently: Inject 8 Units into the skin daily.   Henrene Pastor, RPH-CPP  Active Self, Pharmacy Records  Insulin Syringe-Needle U-100 (TRUEPLUS INSULIN SYRINGE) 31G X 5/16" 0.5 ML MISC [id number]  USE TO INJECT INSULIN 4 TIMES A Sammuel Bailiff, MD  Active Self, Pharmacy Records  Investigational - Study Medication [id number]  Take 1 tablet by mouth daily. TTI-101  Patient not taking: Reported on 12/10/2022   [provider]  Active Self, Pharmacy Records           Med Note Jola Schmidt   Fri Dec 10, 2022 12:39 PM) Stopped about 4-5 days ago.   metoprolol succinate (TOPROL-XL) 25 MG 24 hr tablet [id number]  TAKE 1 TABLET (25 MG TOTAL) BY MOUTH DAILY.  Patient taking differently: Take 25 mg by mouth daily.   Sandford Craze, NP  Active Self, Pharmacy Records  ondansetron (ZOFRAN-ODT) 4 MG disintegrating tablet [id number]  Take 1 tablet (4 mg total) by mouth every 8 (eight) hours as needed for nausea or vomiting. Tegeler, Canary Brim, MD  Active Self, Pharmacy Records  pantoprazole (PROTONIX) 40 MG tablet [id number]  Take 1 tablet (40 mg total) by mouth daily. Burnadette Pop, MD  Active   phosphorus (K PHOS NEUTRAL) 188-416-606 MG tablet [id number]  Take 2 tablets (500 mg total) by mouth 3 (three) times daily for 3 days. Burnadette Pop, MD  Active             Home Care and Equipment/Supplies: Were Home Health Services Ordered?:  No Any new equipment or medical supplies ordered?: No  Functional Questionnaire: Do you need assistance with bathing/showering or dressing?: Yes (son assists as indicated) Do you need assistance with meal preparation?: Yes (son assists as indicated) Do you need assistance with eating?: No Do you have difficulty maintaining continence: No Do you need assistance with getting out of bed/getting out of a chair/moving?: Yes (son assists as indicated) Do you have difficulty managing or taking your medications?: Yes (son assists as indicated)  Follow up appointments reviewed: PCP Follow-up appointment confirmed?: Yes (care coordination outreach in real-time with scheduling care guide to successfully schedule hospital follow up PCP appointment) Date of PCP follow-up appointment?: 12/17/22 Follow-up Provider: PCP Specialist Hospital Follow-up appointment confirmed?: Yes Date of Specialist follow-up appointment?: 12/14/22 Follow-Up Specialty Provider:: pulmonary provider Do you need transportation to your follow-up appointment?: No Do you understand care options if your condition(s) worsen?: Yes-patient verbalized understanding  SDOH Interventions Today    Flowsheet Row Most Recent Value  SDOH Interventions   Food Insecurity Interventions Intervention Not Indicated  Transportation Interventions Intervention Not Indicated  [son provides transportation]      TOC Interventions Today    Flowsheet Row Most Recent Value  TOC Interventions  TOC Interventions Discussed/Reviewed TOC Interventions Discussed, Arranged PCP follow up within 7 days/Care Guide scheduled  [provided my direct contact information should questions/ concerns/ needs arise post-TOC call, prior to RN CM telephone visit 12/24/22]      Interventions Today    Flowsheet Row Most Recent Value  Chronic Disease   Chronic disease during today's visit Other  [metabolic acidosis/ colitis,  pulmonary fibrosis]  General Interventions    General Interventions Discussed/Reviewed Doctor Visits, Referral to Nurse, Communication with  Doctor Visits Discussed/Reviewed Doctor Visits Discussed, Specialist, PCP  PCP/Specialist Visits Compliance with follow-up visit  Communication with RN  [scheduled with RN CM Care Coordinator for follow up call 12/24/22]  Education Interventions   Education Provided Provided Education  Provided Verbal Education On Medication, When to see the doctor, Other, Nutrition  [potential benefits of adding probiotic in setting of extended antibiotic therapy,  talking popints to cover during pulmonary provider office visit tomorrow, ]  Nutrition Interventions   Nutrition Discussed/Reviewed Nutrition Discussed  Pharmacy Interventions   Pharmacy Dicussed/Reviewed Pharmacy Topics Discussed  [Full medication review with updating medication list in EHR per patient's son report]      Caryl Pina, RN, BSN, CCRN Alumnus RN CM Care Coordination/ Transition of Care- [facility] Care Management [phone]: direct office

## 2022-12-13 NOTE — Telephone Encounter (Addendum)
He did not show up for his standard of care visit acute visit with Dr. Felisa Bonier.  He has a research visit t at 10 AM tomorrow 12/14/2022.  Will see if he can make it for that     SIGNATURE    Dr. Kalman Shan, M.D., F.C.C.P,  Pulmonary and Critical Care Medicine Staff Physician, [facility] Center Director - Interstitial Lung Disease  Program  Medical Director - Gerri Spore Long ICU Pulmonary Fibrosis [facility] Network at [facility] [place], [place], [address]   Pager: [phone], If no answer  -> Check AMION or Try [phone] Telephone (clinical office): [phone] Telephone (research): [phone]  2:09 PM 12/13/2022

## 2022-12-14 ENCOUNTER — Ambulatory Visit: Payer: Self-pay | Admitting: *Deleted

## 2022-12-14 ENCOUNTER — Encounter (INDEPENDENT_AMBULATORY_CARE_PROVIDER_SITE_OTHER): Payer: PPO | Admitting: Internal Medicine

## 2022-12-14 ENCOUNTER — Encounter: Payer: PPO | Admitting: Primary Care

## 2022-12-14 DIAGNOSIS — J84112 Idiopathic pulmonary fibrosis: Secondary | ICD-10-CM

## 2022-12-14 DIAGNOSIS — E1310 Other specified diabetes mellitus with ketoacidosis without coma: Secondary | ICD-10-CM

## 2022-12-14 DIAGNOSIS — K529 Noninfective gastroenteritis and colitis, unspecified: Secondary | ICD-10-CM

## 2022-12-14 LAB — HEPATIC FUNCTION PANEL
ALT: 20 U/L (ref 0–53)
AST: 22 U/L (ref 0–37)
Albumin: 3.7 g/dL (ref 3.5–5.2)
Alkaline Phosphatase: 82 U/L (ref 39–117)
Bilirubin, Direct: 0.2 mg/dL (ref 0.0–0.3)
Total Bilirubin: 0.6 mg/dL (ref 0.2–1.2)
Total Protein: 7.7 g/dL (ref 6.0–8.3)

## 2022-12-14 LAB — BASIC METABOLIC PANEL
BUN: 8 mg/dL (ref 6–23)
CO2: 27 mEq/L (ref 19–32)
Calcium: 9.5 mg/dL (ref 8.4–10.5)
Chloride: 98 mEq/L (ref 96–112)
Creatinine, Ser: 0.9 mg/dL (ref 0.40–1.50)
GFR: 82.91 mL/min (ref >60.00)
Glucose, Bld: 123 mg/dL — ABNORMAL HIGH (ref 70–99)
Potassium: 3.6 mEq/L (ref 3.5–5.1)
Sodium: 137 mEq/L (ref 135–145)

## 2022-12-14 LAB — LACTATE DEHYDROGENASE: LDH: 241 U/L (ref 120–250)

## 2022-12-14 LAB — CBC WITH DIFFERENTIAL/PLATELET
Basophils Absolute: 0 10*3/uL (ref 0.0–0.1)
Basophils Relative: 0.4 % (ref 0.0–3.0)
Eosinophils Absolute: 0.1 10*3/uL (ref 0.0–0.7)
Eosinophils Relative: 1.5 % (ref 0.0–5.0)
HCT: 45.2 % (ref 39.0–52.0)
Hemoglobin: 15.1 g/dL (ref 13.0–17.0)
Lymphocytes Relative: 14.3 % (ref 12.0–46.0)
Lymphs Abs: 1.4 10*3/uL (ref 0.7–4.0)
MCHC: 33.4 g/dL (ref 30.0–36.0)
MCV: 90.2 fl (ref 78.0–100.0)
Monocytes Absolute: 1.5 10*3/uL — ABNORMAL HIGH (ref 0.1–1.0)
Monocytes Relative: 15.3 % — ABNORMAL HIGH (ref 3.0–12.0)
Neutro Abs: 6.8 10*3/uL (ref 1.4–7.7)
Neutrophils Relative %: 68.5 % (ref 43.0–77.0)
Platelets: 395 10*3/uL (ref 150.0–400.0)
RBC: 5.01 Mil/uL (ref 4.22–5.81)
RDW: 13.9 % (ref 11.5–15.5)
WBC: 9.9 10*3/uL (ref 4.0–10.5)

## 2022-12-14 LAB — PHOSPHORUS: Phosphorus: 2.6 mg/dL (ref 2.3–4.6)

## 2022-12-14 LAB — MAGNESIUM: Magnesium: 2 mg/dL (ref 1.5–2.5)

## 2022-12-14 LAB — LIPASE: Lipase: 19 U/L (ref 11.0–59.0)

## 2022-12-14 NOTE — Progress Notes (Signed)
Pls let Ryan [name] know that his post hospital standard of care labs are normal. JUst waiting for a few but looking very reassuring

## 2022-12-14 NOTE — Chronic Care Management (AMB) (Signed)
   12/14/2022  Ryan [name] 18-Dec-1945 [id number]   Patient is not active with CCM services, status changed to previously enrolled.  Irving Shows [facility], BSN RN Case Manager [phone]

## 2022-12-14 NOTE — Telephone Encounter (Signed)
Triage/EMily./ Octavia  He ha sIPF. Got out of hospital for DKA and colitis. WAs supposed to see MEier yesterday but did not show. Has a visit with [facility] 12/14/2022 . Please order stat standard of care labs 12/14/2022  - cbc - bmet - lft - mag - phos  - lactate - Beta Hydroy Butrate - lipase   Indication: Colitis, DKA, IPF  Thanks    SIGNATURE    Dr. Kalman Shan, M.D., F.C.C.P,  Pulmonary and Critical Care Medicine Staff Physician, [facility] Center Director - Interstitial Lung Disease  Program  Medical Director - Gerri Spore Long ICU Pulmonary Fibrosis [facility] Network at [facility] [place], [place], [address]   Pager: [phone], If no answer  -> Check AMION or Try [phone] Telephone (clinical office): [phone] Telephone (research): [phone]  8:33 AM 12/14/2022

## 2022-12-14 NOTE — Telephone Encounter (Signed)
Labs have been ordered. Pt is aware that labs have been ordered for standard of care.   Routing to Dr. Marchelle Gearing as an Lorain Childes so he can tell pt to go to lab while he is here during the research visit.

## 2022-12-15 ENCOUNTER — Telehealth: Payer: Self-pay | Admitting: Internal Medicine

## 2022-12-15 DIAGNOSIS — K529 Noninfective gastroenteritis and colitis, unspecified: Secondary | ICD-10-CM

## 2022-12-15 NOTE — Progress Notes (Signed)
Ryan [name],  DOB [birth year], was seen as subject in a clinical trial /Protocol # TVD-[id number]. His participation is being terminated because of serious adverse drug event resulting in hospitalization. He was hospitalized 5/2 - 12/11/2022 presenting to the ED with nausea, vomiting, and diarrhea.  He was profoundly weak , requiring assistance for mobilization.  Also had fallen suffering a laceration above the right eye and injury to the right Achilles tendon.  These findings were in the context of his participation in this clinical trial of pulmonary fibrosis . Imaging suggested segmental colitis.  He received empiric antibiotics for colitis and IV insulin for DKA.  He had not taken the 6th dose of the study drug as of the evening of 4/29 due to the GI symptoms & signs.  He was seen in the ED 4/30 but sent home.  He stated that he had been vomiting at least 6 times a day and exhibiting frank diarrhea approximately 3 times a day.  He had noted glucoses over 550 despite being unable to eat.  At this post hospital follow-up visit 14 Dec 2022 he stated that he was much improved but simply weak. His last episode of diarrhea had been the evening of 5/6 at approximately 9 PM. He was noting improvement each day and has been able to eat as of 5/6 , ingesting a soft diet.  He was beginning to ambulate without help.   [id number] symptoms are now stable. Other or new symptoms denied.  Pertinent physical findings include: The laceration above the right eye is closed; there is no evidence of active cellulitis.  He has extremely poor dentition with multiple missing anterior maxillary teeth with residual fragments to and below the gumline.  There is no evidence of active gingivitis.  Heart rate and rhythm are irregular.  He has minor rales at the bases.  Bronchovesicular quality breath sounds are suggested superiorly.  Slight clubbing of the nailbeds was suggested.  He has marked crepitus of the knees , greater  on the right.  There is a keratotic lesion of the right forearm.  Slight tenting is present although the oral mucosa is clinically adequately hydrated.  There is faint erythema over the right inferior Achilles tendon area which is tender to palpation. All current physical findings NCS.  I have asked the family to monitor the mild erythema of the right inferior Achilles tendon.  They are to report calf pain or progression of the erythema superiorly.  He has a follow-up with his PCP on 5/10.  Clinically he is improving with no significant residual GI symptoms. Re-hospitalization is not clinically indicated.  Diabetic control is improved based on glucose monitoring.  He has agreed to continue participation in the study off the investigational product.                                                                     Pecola Lawless MD,SI

## 2022-12-15 NOTE — Research (Addendum)
TTitle: A Phase 2 Multicenter, Randomized, Double-blind, Placebo Controlled Study to Evaluate the Safety, Tolerability, Pharmacokinetics, and Efficacy of TTI-101 in Participants with Idiopathic Pulmonary Fibrosis    Dose and Duration of Treatment: 100 participants will be enrolled using a 1:1:1:1 randomization ratio over a 20 week period (4 week screening period, 12 week treatment period and a 4 week follow-up period).   25 participants: TTI-101 400 mg/day   25 participants: TTI-101 800 mg/day   25 participants: TTI-101 1200 mg/day   25 participants: placebo   Protocol # TVD-101-003P    Sponsor: [employer] [address] [place], [place] [address] [place] Protocol Version/Amendment: 3.0  for 12/14/2022 date of  18 Jun 2022    Consent Version: ver.  for 12/15/2022 date of [facility] IRB Approved 10 Aug 2022  Investigator Brochure Version: Edition 7..0  for 18 Jun 2022  Investigator Brochure Product: TTI-101        Mechanism of action: TTI-101 is a small-molecule inhibitor of STAT3 (signal transducer and activator of transcription 3). STAT3 plays a major role in the cellular processes involved in fibrosis including fibroblast proliferation. TTI-101 binds tightly to STAT3 which prevents STAT3 recruitment to signaling complexes that contain activated tyrosine kinases, thereby preventing STAT3 phosphorylation on Y705 and STAT3 activation. Binding of TTI-101 to STAT3 also prevents STAT3 dimerization. As such, TTI-101 is expected to prevent or reverse progression of fibrosis in IPF patients.  Key Inclusion Criteria: Age ? 40 years  Diagnosed with IPF based on the 2018 or 2022 ATS/ERS/JRS/ALAT guidelines within 5 years  Chest HRCT performed within 12 months meeting requirements for IPF Extent of fibrosis > emphysematous changes on the HRCT Meeting all the following during screening confirmed by central review,   >45% of predicted FVC and a (FEV1)/FVC ?0.7  A predicted  DLCO (Hb corrected) >30%  SpO2 ? 88% with up to 2L O2/min by pulse oximetry at rest  If currently receiving nintedanib, stable dose for >3 months  Key Exclusion Criteria: Known to have the following diseases at screening: Uncontrolled pulmonary hypertension, or pulmonary hypertension requiring chronic medical treatment Congestive heart failure - NYHA Class III or IV   Drug-induced interstitial pneumonia, pneumoconiosis, hypersensitivity pneumonitis, or radiation pneumonitis   Collagen vascular disease, autoimmune disease, sarcoidosis, granulomatous disease Severe hypertension (BP ? 160/100 despite maximal therapy within 3 months of visit 1) Myocardial infarction, unstable cardiac angina, or history of thrombotic event within 6 months of screening QTcF >450 msec for men and >470 msec for women at screening.   History of significant/symptomatic bradycardia long QT syndrome or  Uncorrected hypomagnesemia or hypokalemia or Heart rate <50 bpm at rest   Unresolved respiratory tract infection within 4 weeks or an acute exacerbation of IPF within 3 months  Active cancer or a history of cancer with a significant risk of recurrence during the study.  eGFR ?9mL/min/1.73 m2, Hb?10g/dL, WUJ<8119/JY7 , platelets <100,000/mm3 , serum total bilirubin >1.5 x ULN, liver enzymes ? 2 x ULN Receiving steroids > 10 mg/day of prednisolone or its equivalent within 2 weeks   Immunosuppressive agents within 4 weeks  Received pirfenidone within 3 months, smoking within 3 months   Pharmacokinetics: (ADME) - Contraindications: The SEDD formulation for TTI-101 provided greater systemic exposures relative to the Labrasol/PEG formulation. The SEDD formulation decreased the pill burden while increasing systemic exposures. The half-life of the drug in participants generally ranged from 4-8 h. The half-life data are supportive of the BID dose schedule. The administration of  TTI-101 is limited to investigational use only and  limited to the oral route of administration. TTI-101 is contraindicated for use in participants with known hypersensitivity to any of the excipients. Special Warnings/Considerations: Insufficient experience exists with TTI-101 to provide comprehensive warning guidance beyond what is standard for any investigational drug.  Interactions:  Studies indicated the potential for strong induction of CYP1A2 by TTI-101. As expected, TTI-101 did induce metabolism of pirfenidone, known to be primarily metabolized by CYP1A2. There was no interaction shown between TTI-101 and nintedanib. Drug Interaction Studies: [facility] conducted a DDI study in normal healthy volunteers given TTI-101 and either pirfenidone or nintedanib. Serious Adverse Reactions Observed in Clinical studies: 32 participants out of 105 reported SAE. (30.5%). However all SAEs reported were from study 2016-0842 (participants with solid tumors). No SAEs were reported in the DDI study (Healthy volunteer). Majority of SAE were GI disorders.  Serious Adverse Reactions Observed Postmarketing: To date, TTI-101 has not been registered for use or marketed in any jurisdiction. Safety Data:  The safety data from study 2016-0842 demonstrates the most frequent AEs are GI-related ( nausea, vomiting, change in appetite, diarrhea, abdominal pain) and related to liver function (elevated AST and ALT).  Fewer AEs were reported with the SDD formulation. EKG - PR prolongation was seen in 1 out of 105 participants. (1%)    Overall Adverse events in total participants N=105 (%) Severity  Grade1-mild Grade2-Moderate Grade3-Severe Formulation 1  N=15 Formulation 2    N=47 Formulation 3 (SDD)  N=48*  Diarrhea 30 (28.6%) 24 (22.9%) 7 (6.7%) 8 (7.6%) 6 (40.0%) 21 (44.7%) 3 (6.3%)  Nausea 14 (13.3%) 9 (8.6%) 4 (3.8%) 1 (1.0%) 4 (26.7%) 7 (14.9%) 3 (6.3%)  Vomiting 7 (6.7%) 5 (4.8%) 2 (1.9%) - 3 (20.0%) 2 (4.3%) 2 (4.2%)   Abdominal pain 3 (2.9%) 2 (1.9%) 1  (1.0%) - 3 (20.0%) - -  Elevated ALT 7 (6.7%)   4 (3.8%)   3 (2.9%)   3 (2.9%) 2 (13.3%)  4 (8.5%)   1 (2.1%)   Elevated AST 6 (5.7%)   2 (1.9%)   4 (3.8%)   3 (2.9%) 1 (6.7%)  4 (8.5%)  1 (2.1%)   Headache 5 (4.8%) 5 (4.8%) - - - - 5 (10.4%)    TEAE from DDI study alone with Healthy Volunteer N=41 Part 1 nintedanib Part 2 Pirfenidone  Diarrhea  2 (4.9%)     1 (4.8%)  1 (5.0%)  Nausea 2 (4.9%)   1 (4.8%)   1 (5.0%)   Vomiting 1 (2.4%)     -  1 (5.0%)   Abdominal distension 1 (2.4%)     1 (4.8%)  -  Headache 5 (12.2%)   4 (19.0%)  1 (5.0%)    Stability:  The recommended storage condition for TTI-101 tablet, 200 mg and matching placebo is room temperature (20C to 25C or 15F to 36F). Excursions between 15C and 30C (69F and 17F) are allowed.    [facility] Clinical Research Coordinator note:   This visit for Subject Ryan [name] with DOB: [birth year] on 12/14/2022 for the above protocol is Visit/Encounter # 2  and is for purpose of research.   The consent for this encounter is under Protocol Version 3.0, Investigator Brochure Version 7.0, Consent Version [id number] and  is currently IRB approved.   Subject expressed continued interest and consent in continuing as a study subject. Subject confirmed that there was    no change in contact information (e.g. address, telephone,  email). Subject thanked for participation in research and contribution to science. In this visit 12/14/2022 the subject will be evaluated by Sub-Investigator named Dr. Marga Melnick. This research coordinator has verified that the above investigator is up to date with his/her training logs.   The Subject was informed that the PI  continues to have oversight of the subject's visits and course through relevant discussions, reviews, and also specifically of this visit by routing of this note to the PI.   1. This visit is  visit 2. The PI is not available for this visit.   Because the PI is NOT  available, the Sub-Investigator reported and CRC has confirmed that the PI has discussed the visit a-priori with the Sub-Investigator.  2.  In addition, ahead of this visit,  the visit and subject were discussed with the PI  on date of 13 Dec 2022 over  phone as part of direct PI oversight.    Patient was seen for study visit 2 as well as end of treatment with IP withdrawn 06 December 2022 and for follow up after discharge from hospital 11 Dec 2022.  Please see subject binder for further details.  Signed by  Christell Constant MD  Clinical Research Coordinator  [facility]  [place], [place] 12:29 PM 12/15/2022

## 2022-12-15 NOTE — Telephone Encounter (Signed)
    Ryan [name]  Was in hispital wit GI symptoms. CT showed colitis/iliets. Please refer to Dr Jeani Hawking of GI. I spoke to Dr Charna Elizabeth his senior partner     SIGNATURE    Dr. Kalman Shan, M.D., F.C.C.P,  Pulmonary and Critical Care Medicine Staff Physician, [facility] Center Director - Interstitial Lung Disease  Program  Medical Director - [facility] Pulmonary Fibrosis [facility] Network at [facility] [place], [place], [address]   Pager: [phone], If no answer  -> Check AMION or Try [phone] Telephone (clinical office): [phone] Telephone (research): [phone]  10:48 AM 12/15/2022

## 2022-12-16 NOTE — Progress Notes (Signed)
Subjective:   By signing my name below, I, Ryan [name], attest that this documentation has been prepared under the direction and in the presence of [name], NP 12/17/22   Patient ID: Ryan [name], male    DOB: 09-06-1945, 77 y.o.   MRN: [id number]  Chief Complaint  Patient presents with   Hospitalization Follow-up    HPI Patient is in today for a hospital follow up.   N/V/D: He presented to the ED on 4/30 for nausea, vomiting, and diarrhea. He believed these symptoms were caused by an experimental medication from a study for IPF. He has since stopped taking this medication. His symptoms continued after discharge and he returned to the ED on 5/2. Pt reports that he fell trying to get to the bathroom because he was so weak.  Family found him on the ground and called EMS.  He does not remember the fall. He has had a very low appetite and is not eating much until the last few days when his appetite has improved. CT abd/pelvis noted the following:    Circumferential wall thickening of a decompressed cecum, ascending colon and hepatic flexure. Some wall thickening may also involve the terminal ileum. Findings are suggestive of segmental colitis and also potentially ileitis of the terminal ileum. No evidence to suggest bowel perforation, obstruction or focal abscess.  GI panel was negative while hospitalized as well as blood cultures drawn on 12/07/22.   DKA- He notes his blood sugar was elevated when hospitalized, in the 500s. He was noted to be in DKA.  Sugars were stabilized and he was sent home on his regular home regimen at discharge. He reports that his sugars are starting to normalize.   Foot pain: He complains of  right foot pain. He notes he was previously unable to stand on this foot.   Left toe injury: He reports a fall that injured his 4th toe on his left foot, which he believes he fractured. He denies any current pain.   Past Medical History:  Diagnosis Date    Allergy    Arthritis    Diabetes mellitus    GERD (gastroesophageal reflux disease)    History of chicken pox    Hyperlipidemia    Hypertension    IPF (idiopathic pulmonary fibrosis) (HCC)    Myocardial infarction (HCC) 2017   Osteoporosis    Type 2 diabetes mellitus with hyperglycemia, with long-term current use of insulin (HCC) 09/08/2015    Past Surgical History:  Procedure Laterality Date   EYE SURGERY  1998   bilateral   SHOULDER SURGERY  1990   right shoulder, torn rotator cuff.    Family History  Problem Relation Age of Onset   Cancer Mother        cancer   Heart disease Mother    Hyperlipidemia Mother    Hypertension Mother    Hyperlipidemia Father    Stroke Father    Hypertension Father    Diabetes Paternal Grandfather    Colon cancer Neg Hx    Stomach cancer Neg Hx    Esophageal cancer Neg Hx     Social History   Socioeconomic History   Marital status: Widowed    Spouse name: Not on file   Number of children: 1   Years of education: Not on file   Highest education level: Not on file  Occupational History   Occupation: retired    Comment: from retail  Tobacco Use   Smoking status:  Former    Types: Cigarettes    Quit date: 09/01/1965    Years since quitting: 57.3   Smokeless tobacco: Never   Tobacco comments:    Smoked about 1cig a week for about 6 months.  Vaping Use   Vaping Use: Never used  Substance and Sexual Activity   Alcohol use: No   Drug use: No   Sexual activity: Never  Other Topics Concern   Not on file  Social History Narrative   Regular exercise:  5 x weekly   Caffeine Use: 1 cups coffee daily.   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   Son, daughter in law and young adult granddaughter and grandson live with client   Wife died from ovarian cancer in 12/31/2014         Social Determinants of Health   Financial Resource Strain: Low Risk  (02/08/2022)   Overall Financial Resource Strain (CARDIA)    Difficulty of Paying Living  Expenses: Not hard at all  Food Insecurity: No Food Insecurity (12/13/2022)   Hunger Vital Sign    Worried About Running Out of Food in the Last Year: Never true    Ran Out of Food in the Last Year: Never true  Transportation Needs: No Transportation Needs (12/13/2022)   PRAPARE - Administrator, Civil Service (Medical): No    Lack of Transportation (Non-Medical): No  Physical Activity: Sufficiently Active (02/08/2022)   Exercise Vital Sign    Days of Exercise per Week: 7 days    Minutes of Exercise per Session: 30 min  Stress: No Stress Concern Present (02/08/2022)   [employer] of Occupational Health - Occupational Stress Questionnaire    Feeling of Stress : Not at all  Social Connections: Socially Isolated (02/02/2021)   Social Connection and Isolation Panel [NHANES]    Frequency of Communication with Friends and Family: More than three times a week    Frequency of Social Gatherings with Friends and Family: Once a week    Attends Religious Services: Never    Database administrator or Organizations: No    Attends Banker Meetings: Never    Marital Status: Widowed  Intimate Partner Violence: Not At Risk (12/09/2022)   Humiliation, Afraid, Rape, and Kick questionnaire    Fear of Current or Ex-Partner: No    Emotionally Abused: No    Physically Abused: No    Sexually Abused: No    Outpatient Medications Prior to Visit  Medication Sig Dispense Refill   alendronate (FOSAMAX) 70 MG tablet Take 1 tablet (70 mg total) by mouth every 7 (seven) days. Take with a full glass of water on an empty stomach. 12 tablet 4   amoxicillin-clavulanate (AUGMENTIN) 875-125 MG tablet Take 1 tablet by mouth every 12 (twelve) hours for 7 days. 14 tablet 0   apixaban (ELIQUIS) 5 MG TABS tablet Take 1 tablet (5 mg total) by mouth 2 (two) times daily. 60 tablet 5   atorvastatin (LIPITOR) 40 MG tablet TAKE 1 TABLET (40 MG TOTAL) BY MOUTH DAILY. 90 tablet 1   Calcium Carbonate-Vitamin D  600-400 MG-UNIT tablet Take 1 tablet by mouth 2 (two) times daily. Reported on 01/27/2016     cholecalciferol (VITAMIN D3) 25 MCG (1000 UNIT) tablet Take 1,000 Units by mouth daily.     Glucagon, rDNA, (GLUCAGON EMERGENCY) 1 MG KIT Inject 1 mg into the muscle once as needed for up to 1 dose. (Patient taking differently: Inject 1 mg into the muscle as needed (  blood sugar below 45).) 1 kit 12   insulin aspart (NOVOLOG) 100 UNIT/ML injection Inject 2-3 Units into the skin 3 (three) times daily before meals. 10 mL PRN   insulin degludec (TRESIBA FLEXTOUCH) 100 UNIT/ML FlexTouch Pen Inject 8 Units into the skin daily. Will start in 2024 after he completes current supply of Levemir. (Patient taking differently: Inject 8 Units into the skin daily.) 15 mL 0   Insulin Syringe-Needle U-100 (TRUEPLUS INSULIN SYRINGE) 31G X 5/16" 0.5 ML MISC USE TO INJECT INSULIN 4 TIMES A DAY 300 each 3   Investigational - Study Medication Take 1 tablet by mouth daily. TTI-101     metoprolol succinate (TOPROL-XL) 25 MG 24 hr tablet TAKE 1 TABLET (25 MG TOTAL) BY MOUTH DAILY. (Patient taking differently: Take 25 mg by mouth daily.) 90 tablet 1   ondansetron (ZOFRAN-ODT) 4 MG disintegrating tablet Take 1 tablet (4 mg total) by mouth every 8 (eight) hours as needed for nausea or vomiting. 20 tablet 0   pantoprazole (PROTONIX) 40 MG tablet Take 1 tablet (40 mg total) by mouth daily. 14 tablet 0   No facility-administered medications prior to visit.    Allergies  Allergen Reactions   Other Other (See Comments)    Client states he became very sick and had significant skin inflammation and irritation with the Freestyle Libre sensor   Lisinopril     Cough     Review of Systems  Gastrointestinal:  Positive for diarrhea, nausea and vomiting.  Musculoskeletal:  Positive for falls.       Objective:    Physical Exam Constitutional:      Appearance: Normal appearance.     Comments: Weak appearing, appears thinner than last  visit  Cardiovascular:     Rate and Rhythm: Normal rate and regular rhythm.  Pulmonary:     Breath sounds: Examination of the right-lower field reveals rales. Examination of the left-lower field reveals rales. Rales present.  Musculoskeletal:     Comments: No swelling noted of left 4th toe, no tenderness to palpation of bruising  Feet:     Right foot:     Skin integrity: Ulcer (stage 1 pressure ulcer, right heel) present.     Comments: Stage 1 pressure ulcer right heel  Skin:    General: Skin is warm and dry.  Neurological:     General: No focal deficit present.     Mental Status: He is alert and oriented to person, place, and time.     BP 117/89 (BP Location: Right Arm, Patient Position: Sitting, Cuff Size: Small)   Pulse 71   Temp 97.9 F (36.6 C) (Oral)   Resp 16   Wt 154 lb (69.9 kg)   SpO2 100%   BMI 23.42 kg/m  Wt Readings from Last 3 Encounters:  12/17/22 154 lb (69.9 kg)  12/09/22 153 lb (69.4 kg)  12/07/22 154 lb (69.9 kg)       Assessment & Plan:  Colitis Assessment & Plan: Clinically resolved.  I suspect that he had an infectious colitis, possibly viral.  Drug reaction is a possibility from the study drug he was taking. Will refer to GI for possible colonoscopy.  Reviewed lab work from 5/7.  Lab work notes resolution of leukocytosis and normal electrolytes/renal function.  He notes that he became weaker while hospitalized. No PT was arranged at discharge.  He was struggling initially when he got home but he is now walking. Offered Home Health PT referral but he declines.  Orders: -     Ambulatory referral to Gastroenterology  Acute right ankle pain Assessment & Plan: Started following his fall. Will check x-ray to rule out fracture.   Orders: -     DG Ankle Complete Right; Future  Controlled type 2 diabetes mellitus with complication, with long-term current use of insulin (HCC) Assessment & Plan: Reports recent blood sugars have been stable. He follows  with Dr. Lafe Garin.  Lab Results  Component Value Date   HGBA1C 7.3 (A) 10/01/2022         I,Rachel Rivera,acting as a scribe for [name], NP.,have documented all relevant documentation on the behalf of [name], NP,as directed by  [name], NP while in the presence of [name], NP.   I, [name], NP, personally preformed the services described in this documentation.  All medical record entries made by the scribe were at my direction and in my presence.  I have reviewed the chart and discharge instructions (if applicable) and agree that the record reflects my personal performance and is accurate and complete. 12/17/22   [name], NP

## 2022-12-17 ENCOUNTER — Ambulatory Visit: Payer: PPO | Admitting: Family

## 2022-12-17 ENCOUNTER — Ambulatory Visit (HOSPITAL_BASED_OUTPATIENT_CLINIC_OR_DEPARTMENT_OTHER)
Admission: RE | Admit: 2022-12-17 | Discharge: 2022-12-17 | Disposition: A | Discharge status: Home / Self Care | Payer: PPO | Source: Ambulatory Visit | Attending: Family | Admitting: Family

## 2022-12-17 VITALS — BP 117/89 | HR 71 | Temp 97.9°F | Resp 16 | Wt 154.0 lb

## 2022-12-17 DIAGNOSIS — K529 Noninfective gastroenteritis and colitis, unspecified: Secondary | ICD-10-CM

## 2022-12-17 DIAGNOSIS — M25571 Pain in right ankle and joints of right foot: Secondary | ICD-10-CM | POA: Diagnosis not present

## 2022-12-17 DIAGNOSIS — Z794 Long term (current) use of insulin: Secondary | ICD-10-CM

## 2022-12-17 DIAGNOSIS — E118 Type 2 diabetes mellitus with unspecified complications: Secondary | ICD-10-CM | POA: Diagnosis not present

## 2022-12-17 HISTORY — DX: Noninfective gastroenteritis and colitis, unspecified: K52.9

## 2022-12-17 NOTE — Assessment & Plan Note (Addendum)
Clinically resolved.  I suspect that he had an infectious colitis, possibly viral.  Drug reaction is a possibility from the study drug he was taking. Will refer to GI for possible colonoscopy.  Reviewed lab work from 5/7.  Lab work notes resolution of leukocytosis and normal electrolytes/renal function.  He notes that he became weaker while hospitalized. No PT was arranged at discharge.  He was struggling initially when he got home but he is now walking. Offered Home Health PT referral but he declines.

## 2022-12-17 NOTE — Assessment & Plan Note (Signed)
Started following his fall. Will check x-ray to rule out fracture.

## 2022-12-17 NOTE — Assessment & Plan Note (Signed)
Reports recent blood sugars have been stable. He follows with Dr. Lafe Garin.  Lab Results  Component Value Date   HGBA1C 7.3 (A) 10/01/2022

## 2022-12-20 ENCOUNTER — Telehealth: Payer: Self-pay | Admitting: Pharmacist

## 2022-12-20 ENCOUNTER — Telehealth: Payer: Self-pay | Admitting: *Deleted

## 2022-12-20 LAB — BETA-HYDROXYBUTYRATE: Beta-Hydroxybutyric Acid: 1.76 mmol/L — ABNORMAL HIGH

## 2022-12-20 NOTE — Telephone Encounter (Signed)
Pt son brought back Fiasp insulin.  Per note from 11/09/22 it was stated that it was suppose to be brought back in and sent back to [facility].  They were suppose to have sent Triseba.  Nichole checked with [employer] and they stated prescription has now expired and we will have to send in a new order.  She will fax over to me tomorrow and we will then send back in.  Sample given to patient since he was out.

## 2022-12-20 NOTE — Telephone Encounter (Signed)
Received message from our front desk that patient's son was dropping off Ryan [name] that is to be returned to [employer]. They sent wrong insulin - Fiasp instead of [place].  I have return label for Ryan [name] and will send back to [employer] but I don't see that we received any Evaristo Bury as was ordered.   Patient has been on Levemir 8 units daily but plan was to change to [place] 8 units daily because Levemir has been discontinued.  Ryan [name] states he has about 1 week of Levemir left.   Will forward to Med Assistance Team to check on Tresiba shipment. May need to ask for voucher to get 30 days free at local pharmacy if shipment will not arrive before patient is out of Levemir around 12/26/2022.

## 2022-12-20 NOTE — Telephone Encounter (Signed)
Called company and I was told that his Evaristo Bury was never reorder when the mistake was made 11/09/2022 BY COMPANY. I was told by [employer] that I needed to resubmitted a reorder form because the  SCRIPT ORDER expires after 3 months.   I have faxed providers reorder pages to [phone] ATTENTION TO CMA Einar Pheasant, CMA. PLEASE REVIEW AND HAVE PROVIDER TO SIGN AND SEND BACK TO ME.   Melanee Spry CPhT Rx Patient Advocate [phone]

## 2022-12-20 NOTE — Telephone Encounter (Signed)
Called and spoke with pt letting him know the info per MR about referring him to GI. Pt verbalized understanding. Referral has been placed. Nothing further needed.

## 2022-12-24 NOTE — Telephone Encounter (Signed)
RECEIVED PROVIDER REORDER FOR TRESIBA Submitted application for  to [employer] for patient assistance.   Phone: [phone]  PLEASE BE ADVISED   Melanee Spry CPhT Rx Patient Advocate [phone]

## 2022-12-24 NOTE — Telephone Encounter (Signed)
Return of Fiasp to [employer] patient assistance program was completed. See pic of return below. Send via [employer].

## 2022-12-28 ENCOUNTER — Encounter (INDEPENDENT_AMBULATORY_CARE_PROVIDER_SITE_OTHER): Payer: PPO | Admitting: Internal Medicine

## 2022-12-28 ENCOUNTER — Encounter: Payer: Self-pay | Admitting: Internal Medicine

## 2022-12-28 ENCOUNTER — Encounter: Payer: PPO | Admitting: Internal Medicine

## 2022-12-28 DIAGNOSIS — Z006 Encounter for examination for normal comparison and control in clinical research program: Secondary | ICD-10-CM

## 2022-12-28 DIAGNOSIS — J84112 Idiopathic pulmonary fibrosis: Secondary | ICD-10-CM

## 2022-12-28 NOTE — Progress Notes (Signed)
TTitle: A Phase 2 Multicenter, Randomized, Double-blind, Placebo Controlled Study to Evaluate the Safety, Tolerability, Pharmacokinetics, and Efficacy of TTI-101 in Participants with Idiopathic Pulmonary Fibrosis    Dose and Duration of Treatment: 100 participants will be enrolled using a 1:1:1:1 randomization ratio over a 20 week period (4 week screening period, 12 week treatment period and a 4 week follow-up period).   25 participants: TTI-101 400 mg/day   25 participants: TTI-101 800 mg/day   25 participants: TTI-101 1200 mg/day   25 participants: placebo   Protocol # TVD-101-003P    Sponsor: [employer], [address] [employer], [address] [place], [place] [address] [place]   Mechanism of action: TTI-101 is a small-molecule inhibitor of STAT3 (signal transducer and activator of transcription 3). STAT3 plays a major role in the cellular processes involved in fibrosis including fibroblast proliferation. TTI-101 binds tightly to STAT3 which prevents STAT3 recruitment to signaling complexes that contain activated tyrosine kinases, thereby preventing STAT3 phosphorylation on Y705 and STAT3 activation. Binding of TTI-101 to STAT3 also prevents STAT3 dimerization. As such, TTI-101 is expected to prevent or reverse progression of fibrosis in IPF patients.     Overall Adverse events in total participants N=105 (%) Severity  Grade1-mild Grade2-Moderate Grade3-Severe Formulation 1  N=15 Formulation 2    N=47 Formulation 3 (SDD)  N=48*  Diarrhea 30 (28.6%) 24 (22.9%) 7 (6.7%) 8 (7.6%) 6 (40.0%) 21 (44.7%) 3 (6.3%)  Nausea 14 (13.3%) 9 (8.6%) 4 (3.8%) 1 (1.0%) 4 (26.7%) 7 (14.9%) 3 (6.3%)  Vomiting 7 (6.7%) 5 (4.8%) 2 (1.9%) - 3 (20.0%) 2 (4.3%) 2 (4.2%)   Abdominal pain 3 (2.9%) 2 (1.9%) 1 (1.0%) - 3 (20.0%) - -  Elevated ALT 7 (6.7%)   4 (3.8%)   3 (2.9%)   3 (2.9%) 2 (13.3%)  4 (8.5%)   1 (2.1%)   Elevated AST 6 (5.7%)   2 (1.9%)   4 (3.8%)   3 (2.9%) 1 (6.7%)  4  (8.5%)  1 (2.1%)   Headache 5 (4.8%) 5 (4.8%) - - - - 5 (10.4%)    TEAE from DDI study alone with Healthy Volunteer N=41 Part 1 nintedanib Part 2 Pirfenidone  Diarrhea  2 (4.9%)     1 (4.8%)  1 (5.0%)  Nausea 2 (4.9%)   1 (4.8%)   1 (5.0%)   Vomiting 1 (2.4%)     -  1 (5.0%)   Abdominal distension 1 (2.4%)     1 (4.8%)  -  Headache 5 (12.2%)   4 (19.0%)  1 (5.0%)    Stability:  The recommended storage condition for TTI-101 tablet, 200 mg and matching placebo is room temperature (20C to 25C or 36F to 64F). Excursions between 15C and 30C (69F and 17F) are allowed.   Xxxxxxxxxxxxxxxxxxxxxxxxxxxxxxxxxx   This visit for Subject Ryan [name] with DOB: 01/18/1946 on 12/28/2022 for the above protocol is Visit/Encounter # V3  and is for purpose of reearch . Subject/LAR expressed continued interest and consent in continuing as a study subject. Subject thanked for participation in research and contribution to science.  He is no longer on the study drug weeks because of SAE and admission.  Currently is fully recovered.  He has gained weight.  He is not having any nausea vomiting diarrhea.  No abdominal pain.  His GI appointment for the colitis is pending and is upcoming pretty soon according to him.  He still expressed continued interest in study participation.  Physical exam shows that  he is gaining weight.  He is walking.  He is going to have lab work pulmonary function test and 6-minute walk test today.  The exam is documented in the paper source.     ICD-10-CM   1. Research subject  Z00.6     2. IPF (idiopathic pulmonary fibrosis) (HCC)  U98.119       Patient Instructions     ICD-10-CM   1. Research subject  Z00.6     2. IPF (idiopathic pulmonary fibrosis) (HCC)  J84.112       Glad you are bette from SAE and back to baseline Weight loss appears resolved  Plan  - complete research procedures PFT and and labs per protocol  - I will review this  Followup  -  research visits June and July 2204 -30 min standard of care visit in Aug 2024     SIGNATURE    Dr. Kalman Shan, M.D., F.C.C.P, ACRP-CPI Pulmonary and Critical Care Medicine Research Investigator, [facility] @ [facility] Health Staff Physician, [facility] Director - Interstitial Lung Disease  Program  Pulmonary Fibrosis [facility] - [facility] and [facility] @ [facility] [place], [place], [address]   Pager: [phone], If no answer  OR between  19:00-7:00h: page [phone] Telephone (research): [phone]  6:04 PM 12/28/2022   6:04 PM 12/28/2022      .

## 2022-12-28 NOTE — Patient Instructions (Addendum)
ICD-10-CM   1. Research subject  Z00.6     2. IPF (idiopathic pulmonary fibrosis) (HCC)  J84.112       Glad you are bette from SAE and back to baseline Weight loss appears resolved  Plan  - complete research procedures PFT and and labs per protocol  - I will review this  Followup  - research visits June and July 2204 -30 min standard of care visit in Aug 2024

## 2022-12-28 NOTE — Research (Unsigned)
TTitle: A Phase 2 Multicenter, Randomized, Double-blind, Placebo Controlled Study to Evaluate the Safety, Tolerability, Pharmacokinetics, and Efficacy of TTI-101 in Participants with Idiopathic Pulmonary Fibrosis    Dose and Duration of Treatment: 100 participants will be enrolled using a 1:1:1:1 randomization ratio over a 20 week period (4 week screening period, 12 week treatment period and a 4 week follow-up period).   25 participants: TTI-101 400 mg/day   25 participants: TTI-101 800 mg/day   25 participants: TTI-101 1200 mg/day   25 participants: placebo   Protocol # TVD-101-003P    Sponsor: [employer], [address], [address] [place], [place] [address] [place]  Protocol Version/Amendment: version 3.0, date  10Nov2023,  for 12/28/2022  Below information reflects this PA -   Consent Version: ver. revised  for 12/28/2022 date of 02Jan2024   Investigator Brochure Version: Edition Number 7.0  for @TD  date@date  of10Nov2023  below information reflects this IB   Investigator Brochure Product: TTI-101        Mechanism of action: TTI-101 is a small-molecule inhibitor of STAT3 (signal transducer and activator of transcription 3). STAT3 plays a major role in the cellular processes involved in fibrosis including fibroblast proliferation. TTI-101 binds tightly to STAT3 which prevents STAT3 recruitment to signaling complexes that contain activated tyrosine kinases, thereby preventing STAT3 phosphorylation on Y705 and STAT3 activation. Binding of TTI-101 to STAT3 also prevents STAT3 dimerization. As such, TTI-101 is expected to prevent or reverse progression of fibrosis in IPF patients.  Key Inclusion Criteria: Age ? 40 years  Diagnosed with IPF based on the 2018 or 2022 ATS/ERS/JRS/ALAT guidelines within 5 years  Chest HRCT performed within 12 months meeting requirements for IPF Extent of fibrosis > emphysematous changes on the HRCT Meeting all the following during screening  confirmed by central review,   >45% of predicted FVC and a (FEV1)/FVC ?0.7  A predicted DLCO (Hb corrected) >30%  SpO2 ? 88% with up to 2L O2/min by pulse oximetry at rest  If currently receiving nintedanib, stable dose for >3 months  Key Exclusion Criteria: Known to have the following diseases at screening: Uncontrolled pulmonary hypertension, or pulmonary hypertension requiring chronic medical treatment Congestive heart failure - NYHA Class III or IV   Drug-induced interstitial pneumonia, pneumoconiosis, hypersensitivity pneumonitis, or radiation pneumonitis   Collagen vascular disease, autoimmune disease, sarcoidosis, granulomatous disease Severe hypertension (BP ? 160/100 despite maximal therapy within 3 months of visit 1) Myocardial infarction, unstable cardiac angina, or history of thrombotic event within 6 months of screening QTcF >450 msec for men and >470 msec for women at screening.   History of significant/symptomatic bradycardia long QT syndrome or  Uncorrected hypomagnesemia or hypokalemia or Heart rate <50 bpm at rest   Unresolved respiratory tract infection within 4 weeks or an acute exacerbation of IPF within 3 months  Active cancer or a history of cancer with a significant risk of recurrence during the study.  eGFR ?63mL/min/1.73 m2, Hb?10g/dL, UEA<5409/WJ1 , platelets <100,000/mm3 , serum total bilirubin >1.5 x ULN, liver enzymes ? 2 x ULN Receiving steroids > 10 mg/day of prednisolone or its equivalent within 2 weeks   Immunosuppressive agents within 4 weeks  Received pirfenidone within 3 months, smoking within 3 months   Pharmacokinetics: (ADME) - Contraindications: The SEDD formulation for TTI-101 provided greater systemic exposures relative to the Labrasol/PEG formulation. The SEDD formulation decreased the pill burden while increasing systemic exposures. The half-life of the drug in participants generally ranged from 4-8 h. The half-life data  are supportive of the  BID dose schedule. The administration of TTI-101 is limited to investigational use only and limited to the oral route of administration. TTI-101 is contraindicated for use in participants with known hypersensitivity to any of the excipients. Special Warnings/Considerations: Insufficient experience exists with TTI-101 to provide comprehensive warning guidance beyond what is standard for any investigational drug.  Interactions:  Studies indicated the potential for strong induction of CYP1A2 by TTI-101. As expected, TTI-101 did induce metabolism of pirfenidone, known to be primarily metabolized by CYP1A2. There was no interaction shown between TTI-101 and nintedanib. Drug Interaction Studies: [facility] conducted a DDI study in normal healthy volunteers given TTI-101 and either pirfenidone or nintedanib. Serious Adverse Reactions Observed in Clinical studies: 32 participants out of 105 reported SAE. (30.5%). However all SAEs reported were from study 2016-0842 (participants with solid tumors). No SAEs were reported in the DDI study (Healthy volunteer). Majority of SAE were GI disorders.  Serious Adverse Reactions Observed Postmarketing: To date, TTI-101 has not been registered for use or marketed in any jurisdiction. Safety Data:  The safety data from study 2016-0842 demonstrates the most frequent AEs are GI-related ( nausea, vomiting, change in appetite, diarrhea, abdominal pain) and related to liver function (elevated AST and ALT).  Fewer AEs were reported with the SDD formulation. EKG - PR prolongation was seen in 1 out of 105 participants. (1%)    Overall Adverse events in total participants N=105 (%) Severity  Grade1-mild Grade2-Moderate Grade3-Severe Formulation 1  N=15 Formulation 2    N=47 Formulation 3 (SDD)  N=48*  Diarrhea 30 (28.6%) 24 (22.9%) 7 (6.7%) 8 (7.6%) 6 (40.0%) 21 (44.7%) 3 (6.3%)  Nausea 14 (13.3%) 9 (8.6%) 4 (3.8%) 1 (1.0%) 4 (26.7%) 7 (14.9%) 3 (6.3%)  Vomiting 7  (6.7%) 5 (4.8%) 2 (1.9%) - 3 (20.0%) 2 (4.3%) 2 (4.2%)   Abdominal pain 3 (2.9%) 2 (1.9%) 1 (1.0%) - 3 (20.0%) - -  Elevated ALT 7 (6.7%)   4 (3.8%)   3 (2.9%)   3 (2.9%) 2 (13.3%)  4 (8.5%)   1 (2.1%)   Elevated AST 6 (5.7%)   2 (1.9%)   4 (3.8%)   3 (2.9%) 1 (6.7%)  4 (8.5%)  1 (2.1%)   Headache 5 (4.8%) 5 (4.8%) - - - - 5 (10.4%)    TEAE from DDI study alone with Healthy Volunteer N=41 Part 1 nintedanib Part 2 Pirfenidone  Diarrhea  2 (4.9%)     1 (4.8%)  1 (5.0%)  Nausea 2 (4.9%)   1 (4.8%)   1 (5.0%)   Vomiting 1 (2.4%)     -  1 (5.0%)   Abdominal distension 1 (2.4%)     1 (4.8%)  -  Headache 5 (12.2%)   4 (19.0%)  1 (5.0%)    Stability:  The recommended storage condition for TTI-101 tablet, 200 mg and matching placebo is room temperature (20C to 25C or 63F to 90F). Excursions between 15C and 30C (60F and 66F) are allowed.   [facility] [place] Clinical Research Coordinator note:   This visit for Subject Ryan [name] with DOB: Sep 05, 1945 on 12/28/2022 for the above protocol is Visit/Encounter # 3  and is for purpose of research.   The consent for this encounter is under Protocol Version 3.0, Investigator Brochure Version 7.0, Consent Version [id number] and  is currently IRB approved.   Subject expressed continued interest and consent in continuing as a study subject. Subject confirmed that there was  no  change in contact information (e.g. address, telephone, email). Subject thanked for participation in research and contribution to science. In this visit 12/28/2022 the subject will be evaluated by Principal Investigator named Dr. Marchelle Gearing. This research coordinator has verified that the above investigator is up to date with his/her training logs.   The Subject was informed that the PI  continues to have oversight of the subject's visits and course through relevant discussions, reviews, and also specifically of this visit by routing of this note to the PI.   1.  This visit is a key visit of  study. The PI is  available for this visit.   This is visit 3 after end of treatment occurred at visit 2 with last dose IP 06 December 2022.  Please see subject binder for additional information.   Signed by  Christell Constant MD  Clinical Research Coordinator  [facility]  [place], [place] 11:37 AM 12/28/2022

## 2022-12-29 NOTE — Progress Notes (Signed)
PI OVERSIGHT ATTESTATION  I the Principal Investigator (PI) for the above mentioned study attest that I reviewed the mentioned  clinical research coordinator  notes on research subject  Ryan [name]  born 1945-11-30 . I  agree with the findings mentioned above . See CRC notes.  I also have my own note   Dr.Jesslynn Kruck Marchelle Gearing, MD Pulmonary and Critical Care Medicine Research Investigator & Staff Physician [facility] and [facility]  [place] [facility] Pager: [phone], If no answer or between  15:00h - 7:00h: call [phone]  12/29/2022 3:00 PM

## 2022-12-29 NOTE — Telephone Encounter (Signed)
Received notification from [employer] regarding approval for [facility]. Patient assistance approved from 12/29/2022  patient is approved to receive medicine through 10/09/2023 will renew 08/09/2023.  Letter of approval in media of chart  Phone: [phone] Melanee Spry CPhT Rx Patient Advocate (O) [phone]

## 2023-01-04 ENCOUNTER — Telehealth: Payer: Self-pay | Admitting: Internal Medicine

## 2023-01-04 ENCOUNTER — Ambulatory Visit: Payer: Self-pay

## 2023-01-04 NOTE — Patient Outreach (Signed)
  Care Coordination   Initial Visit Note   01/04/2023 Name: Ryan [name] MRN: [id number] DOB: 11-12-1945  Ryan [name] is a 77 y.o. year old male who sees Ryan Craze, NP for primary care. I spoke with  Ryan [name] by phone today.  What matters to the patients health and wellness today?  Ryan [name] reports he is much improved. He states his blood sugars are better today was 125 this morning. He reports one low blood sugar in the 60's, but states he was fully aware-it was treated by eating something. He denies any other episodes. Ryan [name] reports he has supportive family that helps him stay on top of everything. He states this is not an issue. Ryan [name] denies any questions or concerns at this time.  Goals Addressed             This Visit's Progress    continue to improve post hospitalization       Interventions Today    Flowsheet Row Most Recent Value  Chronic Disease   Chronic disease during today's visit Other, Diabetes, Hypertension (HTN)  [post hospitalization metabolic acidosis]  General Interventions   General Interventions Discussed/Reviewed General Interventions Discussed, Doctor Visits  Doctor Visits Discussed/Reviewed Doctor Visits Discussed, PCP  PCP/Specialist Visits Compliance with follow-up visit  Education Interventions   Education Provided Provided Education  Provided Verbal Education On Other, When to see the doctor, Blood Sugar Monitoring  Pharmacy Interventions   Pharmacy Dicussed/Reviewed Pharmacy Topics Discussed  [confimred has all medications and taking as prescribed. active with clinical pharmacist [name] [name].]  Safety Interventions   Safety Discussed/Reviewed Safety Discussed  [assess therapy needs. patient confirmed he is ambulating without difficulty and denies need for therapy]            SDOH assessments and interventions completed:  No recently completed. No changes.   Care Coordination Interventions:  Yes, provided   Follow up  plan: Follow up call scheduled for 01/18/23    Encounter Outcome:  Pt. Visit Completed   Kathyrn Sheriff, RN, MSN, BSN, CCM [facility] Care Coordinator [phone]

## 2023-01-04 NOTE — Telephone Encounter (Signed)
Telephone call from Mr. Tenorio because he received calls from 2 different GI offices to schedule colonoscopies.  Mr. Bohlen states [facility] is not in network for his insurance but  GI is covered. He wants to know which he needs to see.

## 2023-01-04 NOTE — Patient Instructions (Signed)
Visit Information  Thank you for taking time to visit with me today. Please don't hesitate to contact me if I can be of assistance to you.   Following are the goals we discussed today:  Continue to take medications as prescribed Continue to attend provider visits as scheduled Continue to check and record blood sugar. Notify provider with any readings outside recommended parameters Continue to eat healthy   Our next appointment is by telephone on 01/18/23 at 1:00 pm  Please call the care guide team at [phone] if you need to cancel or reschedule your appointment.   If you are experiencing a Mental Health or Behavioral Health Crisis or need someone to talk to, please call the Suicide and Crisis Lifeline: 52  Kathyrn Sheriff, RN, MSN, BSN, CCM [facility] Care Coordinator [phone]

## 2023-01-05 NOTE — Telephone Encounter (Signed)
Please tell him to see whoever his insurance will approve for GI

## 2023-01-06 ENCOUNTER — Telehealth: Payer: Self-pay | Admitting: Internal Medicine

## 2023-01-06 NOTE — Telephone Encounter (Signed)
Patient called to schedule appointment for Colitis. Also stated he was instructed to have a colonoscopy right away. Patient is requesting a call back to discuss this further. Please advise, thank you.

## 2023-01-06 NOTE — Telephone Encounter (Signed)
Left message for pt to call back  °

## 2023-01-06 NOTE — Telephone Encounter (Signed)
Spoke with pt and he is already scheduled for an appt. And will keep that as scheduled.

## 2023-01-12 NOTE — Telephone Encounter (Signed)
Tresiba 100units 5x3ml prefilled pens received today. Patient notified and he will pick up today.      

## 2023-01-12 NOTE — Telephone Encounter (Signed)
Tresiba 100units 5x4ml prefilled pens received today. Patient notified and he will pick up today.

## 2023-01-17 ENCOUNTER — Other Ambulatory Visit (HOSPITAL_BASED_OUTPATIENT_CLINIC_OR_DEPARTMENT_OTHER): Payer: Self-pay

## 2023-01-18 ENCOUNTER — Ambulatory Visit: Payer: Self-pay

## 2023-01-18 NOTE — Patient Instructions (Signed)
Visit Information  Thank you for taking time to visit with me today. Please don't hesitate to contact me if I can be of assistance to you.   Following are the goals we discussed today:  Continue to attend provider visits as scheduled Continue to take medications as prescribed.  Continue to check BP and blood sugar and notify provider if outside recommended range or with questions/concerns Contact your RN Care Coordinator if care coordination questions prior to next telephone outreach   Our next appointment is by telephone on 03/21/23 at 1:45 pm  Please call the care guide team at [phone] if you need to cancel or reschedule your appointment.   If you are experiencing a Mental Health or Behavioral Health Crisis or need someone to talk to, please call the Suicide and Crisis Lifeline: 1   Kathyrn Sheriff, RN, MSN, BSN, CCM [facility] Care Coordinator [phone]

## 2023-01-18 NOTE — Patient Outreach (Signed)
  Care Coordination   Follow Up Visit Note   01/18/2023 Name: Ryan [name] MRN: [id number] DOB: [birth year]  Ryan [name] is a 77 y.o. year old male who sees Sandford Craze, NP for primary care. I spoke with  Ryan [name] by phone today.  What matters to the patients health and wellness today?  Mr. Bunte reports he is doing well. He reports blood sugars have been raning 80-140. He denies any low blood sugars. He reports blood pressure reading last was 125/80. Has upcoming appointment this month and in August with pulmonologist, endocrinologist, PCP, Cardiology and Gastrologist. He denies any questions or concerns.  Goals Addressed             This Visit's Progress    continue to improve post hospitalization       Interventions Today    Flowsheet Row Most Recent Value  Chronic Disease   Chronic disease during today's visit Hypertension (HTN), Diabetes  General Interventions   General Interventions Discussed/Reviewed General Interventions Reviewed  [encouraged to contact RNCM if care coordination needs before next telephone visit]  Doctor Visits Discussed/Reviewed Doctor Visits Reviewed  [reviewed upcoming/scheduled appointments]  PCP/Specialist Visits Compliance with follow-up visit  Education Interventions   Education Provided Provided Education  Provided Verbal Education On When to see the doctor, Exercise, Blood Sugar Monitoring, Medication, Other  [discussed importance of attending provider visits as scheduled, encouraged to continue to take medications as prescribed. Continue to check BP and blood sugar-notify provider if outside recommended range or with health questions/concerns]  Nutrition Interventions   Nutrition Discussed/Reviewed Nutrition Reviewed  Pharmacy Interventions   Pharmacy Dicussed/Reviewed Pharmacy Topics Reviewed            SDOH assessments and interventions completed:  No  Care Coordination Interventions:  Yes, provided   Follow up plan: Follow  up call scheduled for 03/21/23    Encounter Outcome:  Pt. Visit Completed   Kathyrn Sheriff, RN, MSN, BSN, CCM [facility] Care Coordinator [phone]

## 2023-01-25 ENCOUNTER — Encounter: Payer: PPO | Admitting: Internal Medicine

## 2023-01-26 ENCOUNTER — Encounter (INDEPENDENT_AMBULATORY_CARE_PROVIDER_SITE_OTHER): Payer: PPO | Admitting: Internal Medicine

## 2023-01-26 ENCOUNTER — Encounter: Payer: PPO | Admitting: *Deleted

## 2023-01-26 DIAGNOSIS — J84112 Idiopathic pulmonary fibrosis: Secondary | ICD-10-CM

## 2023-01-26 DIAGNOSIS — Z006 Encounter for examination for normal comparison and control in clinical research program: Secondary | ICD-10-CM

## 2023-01-26 NOTE — Research (Cosign Needed Addendum)
Title: A Phase 2 Multicenter, Randomized, Double-blind, Placebo Controlled Study to Evaluate the Safety, Tolerability, Pharmacokinetics, and Efficacy of TTI-101 in Participants with Idiopathic Pulmonary Fibrosis    Dose and Duration of Treatment: 100 participants will be enrolled using a 1:1:1:1 randomization ratio over a 20 week period (4 week screening period, 12 week treatment period and a 4 week follow-up period).   25 participants: TTI-101 400 mg/day   25 participants: TTI-101 800 mg/day   25 participants: TTI-101 1200 mg/day   25 participants: placebo   Protocol # [id number]    Sponsor: [employer] [address] [employer] [address] [place], [place] [address] [place]  Protocol Version/Amendment: Amendment 3.0, date  10Nov2023,  for 01/26/2023    Consent Version: ver.1  for 01/26/2023 date of 02Jan2024   Investigator Brochure Version: Edition Number 7.0  10 [id number]   Investigator Brochure Product: TTI-101        Mechanism of action: TTI-101 is a small-molecule inhibitor of STAT3 (signal transducer and activator of transcription 3). STAT3 plays a major role in the cellular processes involved in fibrosis including fibroblast proliferation. TTI-101 binds tightly to STAT3 which prevents STAT3 recruitment to signaling complexes that contain activated tyrosine kinases, thereby preventing STAT3 phosphorylation on Y705 and STAT3 activation. Binding of TTI-101 to STAT3 also prevents STAT3 dimerization. As such, TTI-101 is expected to prevent or reverse progression of fibrosis in IPF patients.  Key Inclusion Criteria: Age ? 40 years  Diagnosed with IPF based on the 2018 or 2022 ATS/ERS/JRS/ALAT guidelines within 5 years  Chest HRCT performed within 12 months meeting requirements for IPF Extent of fibrosis > emphysematous changes on the HRCT Meeting all the following during screening confirmed by central review,   >45% of predicted FVC and a (FEV1)/FVC ?0.7  A predicted DLCO (Hb  corrected) >30%  SpO2 ? 88% with up to 2L O2/min by pulse oximetry at rest  If currently receiving nintedanib, stable dose for >3 months  Key Exclusion Criteria: Known to have the following diseases at screening: Uncontrolled pulmonary hypertension, or pulmonary hypertension requiring chronic medical treatment Congestive heart failure - NYHA Class III or IV   Drug-induced interstitial pneumonia, pneumoconiosis, hypersensitivity pneumonitis, or radiation pneumonitis   Collagen vascular disease, autoimmune disease, sarcoidosis, granulomatous disease Severe hypertension (BP ? 160/100 despite maximal therapy within 3 months of visit 1) Myocardial infarction, unstable cardiac angina, or history of thrombotic event within 6 months of screening QTcF >450 msec for men and >470 msec for women at screening.   History of significant/symptomatic bradycardia long QT syndrome or  Uncorrected hypomagnesemia or hypokalemia or Heart rate <50 bpm at rest   Unresolved respiratory tract infection within 4 weeks or an acute exacerbation of IPF within 3 months  Active cancer or a history of cancer with a significant risk of recurrence during the study.  eGFR ?79mL/min/1.73 m2, Hb?10g/dL, JXB<1478/GN5 , platelets <100,000/mm3 , serum total bilirubin >1.5 x ULN, liver enzymes ? 2 x ULN Receiving steroids > 10 mg/day of prednisolone or its equivalent within 2 weeks   Immunosuppressive agents within 4 weeks  Received pirfenidone within 3 months, smoking within 3 months   Pharmacokinetics: (ADME) - Contraindications: The SEDD formulation for TTI-101 provided greater systemic exposures relative to the Labrasol/PEG formulation. The SEDD formulation decreased the pill burden while increasing systemic exposures. The half-life of the drug in participants generally ranged from 4-8 h. The half-life data are supportive of the BID dose schedule. The administration of TTI-101 is limited to investigational  use only and limited  to the oral route of administration. TTI-101 is contraindicated for use in participants with known hypersensitivity to any of the excipients. Special Warnings/Considerations: Insufficient experience exists with TTI-101 to provide comprehensive warning guidance beyond what is standard for any investigational drug.  Interactions:  Studies indicated the potential for strong induction of CYP1A2 by TTI-101. As expected, TTI-101 did induce metabolism of pirfenidone, known to be primarily metabolized by CYP1A2. There was no interaction shown between TTI-101 and nintedanib. Drug Interaction Studies: [facility] conducted a DDI study in normal healthy volunteers given TTI-101 and either pirfenidone or nintedanib. Serious Adverse Reactions Observed in Clinical studies: 32 participants out of 105 reported SAE. (30.5%). However all SAEs reported were from study 2016-0842 (participants with solid tumors). No SAEs were reported in the DDI study (Healthy volunteer). Majority of SAE were GI disorders.  Serious Adverse Reactions Observed Postmarketing: To date, TTI-101 has not been registered for use or marketed in any jurisdiction. Safety Data:  The safety data from study 2016-0842 demonstrates the most frequent AEs are GI-related ( nausea, vomiting, change in appetite, diarrhea, abdominal pain) and related to liver function (elevated AST and ALT).  Fewer AEs were reported with the SDD formulation. EKG - PR prolongation was seen in 1 out of 105 participants. (1%)    Overall Adverse events in total participants N=105 (%) Severity  Grade1-mild Grade2-Moderate Grade3-Severe Formulation 1  N=15 Formulation 2    N=47 Formulation 3 (SDD)  N=48*  Diarrhea 30 (28.6%) 24 (22.9%) 7 (6.7%) 8 (7.6%) 6 (40.0%) 21 (44.7%) 3 (6.3%)  Nausea 14 (13.3%) 9 (8.6%) 4 (3.8%) 1 (1.0%) 4 (26.7%) 7 (14.9%) 3 (6.3%)  Vomiting 7 (6.7%) 5 (4.8%) 2 (1.9%) - 3 (20.0%) 2 (4.3%) 2 (4.2%)   Abdominal pain 3 (2.9%) 2 (1.9%) 1  (1.0%) - 3 (20.0%) - -  Elevated ALT 7 (6.7%)   4 (3.8%)   3 (2.9%)   3 (2.9%) 2 (13.3%)  4 (8.5%)   1 (2.1%)   Elevated AST 6 (5.7%)   2 (1.9%)   4 (3.8%)   3 (2.9%) 1 (6.7%)  4 (8.5%)  1 (2.1%)   Headache 5 (4.8%) 5 (4.8%) - - - - 5 (10.4%)    TEAE from DDI study alone with Healthy Volunteer N=41 Part 1 nintedanib Part 2 Pirfenidone  Diarrhea  2 (4.9%)     1 (4.8%)  1 (5.0%)  Nausea 2 (4.9%)   1 (4.8%)   1 (5.0%)   Vomiting 1 (2.4%)     -  1 (5.0%)   Abdominal distension 1 (2.4%)     1 (4.8%)  -  Headache 5 (12.2%)   4 (19.0%)  1 (5.0%)    Stability:  The recommended storage condition for TTI-101 tablet, 200 mg and matching placebo is room temperature (20C to 25C or 71F to 46F). Excursions between 15C and 30C (55F and 89F) are allowed.     [facility] Clinical Research Coordinator note:   This visit for Subject Ryan [name] with DOB: 09-Mar-1946 on 01/26/2023 for the above protocol is Visit/Encounter # 4  and is for purpose of research.   The consent for this encounter is under Protocol Version 3.0, Investigator Brochure Version 7.0, Consent Version 1 and  is currently IRB approved.   Subject expressed continued interest and consent in continuing as a study subject. Subject confirmed that there was no change in contact information (e.g. address, telephone, email). Subject thanked for participation in research  and contribution to science. In this visit 01/26/2023 the subject will be evaluated by Principal Investigator named Dr. Kalman Shan. This research coordinator has verified that the above investigator is  up to date with his/her training logs.   The Subject was  informed that the PI  continues to have oversight of the subject's visits and course through relevant discussions, reviews, and also specifically of this visit by routing of this note to the PI.   1. This visit is visit 4 . The PI is  available for this visit.    Patient continues in study  after IP withdrawn by visit 2.  All study assessments completed. Please see subject binder for more information.   Signed by  Christell Constant MD  Clinical Research Coordinator  [facility]  [place], [place] 1:21 PM 01/26/2023

## 2023-01-26 NOTE — Patient Instructions (Addendum)
ICD-10-CM   1. Research subject Z00.6   2. IPF (idiopathic pulmonary fibrosis) (HCC) J84.112     Per protocol 

## 2023-01-26 NOTE — Progress Notes (Signed)
TTitle: A Phase 2 Multicenter, Randomized, Double-blind, Placebo Controlled Study to Evaluate the Safety, Tolerability, Pharmacokinetics, and Efficacy of TTI-101 in Participants with Idiopathic Pulmonary Fibrosis    Dose and Duration of Treatment: 100 participants will be enrolled using a 1:1:1:1 randomization ratio over a 20 week period (4 week screening period, 12 week treatment period and a 4 week follow-up period).   25 participants: TTI-101 400 mg/day   25 participants: TTI-101 800 mg/day   25 participants: TTI-101 1200 mg/day   25 participants: placebo   Protocol # TVD-101-003P   xxxxxxxxxxxxxxxxxxxxxxxxxxxxxxxxxxxxxx This visit for Subject Ryan [name] with DOB: March 21, 1946 on 01/26/2023 for the above protocol is Visit/Encounter # 4  and is for purpose of rsearch followup . Subject/LAR expressed continued interest and consent in continuing as a study subject. Subject thanked for participation in research and contribution to science.    S: He continues to be OFF IP following SAE. He has no interest In going back on study drug. He says he will follow through with study vsitis. No new complaints  Objective  - has gained his weight back and fitness  - Lower lobe crackles + - otherwise baseline and in paper chart  A Research IPF  Plan  - per study protocol    SIGNATURE    Dr. Kalman Shan, M.D., F.C.C.P, ACRP-CPI Pulmonary and Critical Care Medicine Research Investigator, [facility] Health Staff Physician, [facility] Center Director - Interstitial Lung Disease  Program  Pulmonary Fibrosis [facility] and [facility] @ [facility] [place], [place], [address]   Pager: [phone], If no answer  OR between  19:00-7:00h: page [phone] Telephone (research): [phone]  5:16 PM 01/26/2023   5:16 PM 01/26/2023

## 2023-02-09 ENCOUNTER — Telehealth: Payer: Self-pay

## 2023-02-09 NOTE — Telephone Encounter (Signed)
Called pt and left a VM to inform pt needles and insulin pens are ready for pick up.

## 2023-02-15 ENCOUNTER — Ambulatory Visit (INDEPENDENT_AMBULATORY_CARE_PROVIDER_SITE_OTHER): Payer: PPO | Admitting: *Deleted

## 2023-02-15 VITALS — BP 150/78 | HR 71 | Ht 68.0 in | Wt 165.0 lb

## 2023-02-15 DIAGNOSIS — Z0000 Encounter for general adult medical examination without abnormal findings: Secondary | ICD-10-CM

## 2023-02-15 NOTE — Patient Instructions (Signed)
Ryan [name] , Thank you for taking time to come for your Medicare Wellness Visit. I appreciate your ongoing commitment to your health goals. Please review the following plan we discussed and let me know if I can assist you in the future.     This is a list of the screening recommended for you and due dates:  Health Maintenance  Topic Date Due   DTaP/Tdap/Td vaccine (3 - Tdap) 08/24/2021   COVID-19 Vaccine (4 - 2023-24 season) 04/09/2022   Eye exam for diabetics  01/07/2023   Flu Shot  03/10/2023   Hemoglobin A1C  04/01/2023   Complete foot exam   05/29/2023   Yearly kidney health urinalysis for diabetes  09/02/2023   Yearly kidney function blood test for diabetes  12/14/2023   Medicare Annual Wellness Visit  02/15/2024   Pneumonia Vaccine  Completed   Hepatitis C Screening  Completed   Zoster (Shingles) Vaccine  Completed   HPV Vaccine  Aged Out   Cologuard (Stool DNA test)  Discontinued    Next appointment: Follow up in one year for your annual wellness visit.   Preventive Care 30 Years and Older, Male Preventive care refers to lifestyle choices and visits with your health care provider that can promote health and wellness. What does preventive care include? A yearly physical exam. This is also called an annual well check. Dental exams once or twice a year. Routine eye exams. Ask your health care provider how often you should have your eyes checked. Personal lifestyle choices, including: Daily care of your teeth and gums. Regular physical activity. Eating a healthy diet. Avoiding tobacco and drug use. Limiting alcohol use. Practicing safe sex. Taking low doses of aspirin every day. Taking vitamin and mineral supplements as recommended by your health care provider. What happens during an annual well check? The services and screenings done by your health care provider during your annual well check will depend on your age, overall health, lifestyle risk factors, and family  history of disease. Counseling  Your health care provider may ask you questions about your: Alcohol use. Tobacco use. Drug use. Emotional well-being. Home and relationship well-being. Sexual activity. Eating habits. History of falls. Memory and ability to understand (cognition). Work and work Astronomer. Screening  You may have the following tests or measurements: Height, weight, and BMI. Blood pressure. Lipid and cholesterol levels. These may be checked every 5 years, or more frequently if you are over 20 years old. Skin check. Lung cancer screening. You may have this screening every year starting at age 12 if you have a 30-pack-year history of smoking and currently smoke or have quit within the past 15 years. Fecal occult blood test (FOBT) of the stool. You may have this test every year starting at age 62. Flexible sigmoidoscopy or colonoscopy. You may have a sigmoidoscopy every 5 years or a colonoscopy every 10 years starting at age 68. Prostate cancer screening. Recommendations will vary depending on your family history and other risks. Hepatitis C blood test. Hepatitis B blood test. Sexually transmitted disease (STD) testing. Diabetes screening. This is done by checking your blood sugar (glucose) after you have not eaten for a while (fasting). You may have this done every 1-3 years. Abdominal aortic aneurysm (AAA) screening. You may need this if you are a current or former smoker. Osteoporosis. You may be screened starting at age 88 if you are at high risk. Talk with your health care provider about your test results, treatment options, and if  necessary, the need for more tests. Vaccines  Your health care provider may recommend certain vaccines, such as: Influenza vaccine. This is recommended every year. Tetanus, diphtheria, and acellular pertussis (Tdap, Td) vaccine. You may need a Td booster every 10 years. Zoster vaccine. You may need this after age 68. Pneumococcal  13-valent conjugate (PCV13) vaccine. One dose is recommended after age 75. Pneumococcal polysaccharide (PPSV23) vaccine. One dose is recommended after age 76. Talk to your health care provider about which screenings and vaccines you need and how often you need them. This information is not intended to replace advice given to you by your health care provider. Make sure you discuss any questions you have with your health care provider. Document Released: 08/22/2015 Document Revised: 04/14/2016 Document Reviewed: 05/27/2015 Elsevier Interactive Patient Education  2017 [employer].  Fall Prevention in the Home Falls can cause injuries. They can happen to people of all ages. There are many things you can do to make your home safe and to help prevent falls. What can I do on the outside of my home? Regularly fix the edges of walkways and driveways and fix any cracks. Remove anything that might make you trip as you walk through a door, such as a raised step or threshold. Trim any bushes or trees on the path to your home. Use bright outdoor lighting. Clear any walking paths of anything that might make someone trip, such as rocks or tools. Regularly check to see if handrails are loose or broken. Make sure that both sides of any steps have handrails. Any raised decks and porches should have guardrails on the edges. Have any leaves, snow, or ice cleared regularly. Use sand or salt on walking paths during winter. Clean up any spills in your garage right away. This includes oil or grease spills. What can I do in the bathroom? Use night lights. Install grab bars by the toilet and in the tub and shower. Do not use towel bars as grab bars. Use non-skid mats or decals in the tub or shower. If you need to sit down in the shower, use a plastic, non-slip stool. Keep the floor dry. Clean up any water that spills on the floor as soon as it happens. Remove soap buildup in the tub or shower regularly. Attach  bath mats securely with double-sided non-slip rug tape. Do not have throw rugs and other things on the floor that can make you trip. What can I do in the bedroom? Use night lights. Make sure that you have a light by your bed that is easy to reach. Do not use any sheets or blankets that are too big for your bed. They should not hang down onto the floor. Have a firm chair that has side arms. You can use this for support while you get dressed. Do not have throw rugs and other things on the floor that can make you trip. What can I do in the kitchen? Clean up any spills right away. Avoid walking on wet floors. Keep items that you use a lot in easy-to-reach places. If you need to reach something above you, use a strong step stool that has a grab bar. Keep electrical cords out of the way. Do not use floor polish or wax that makes floors slippery. If you must use wax, use non-skid floor wax. Do not have throw rugs and other things on the floor that can make you trip. What can I do with my stairs? Do not leave any items  on the stairs. Make sure that there are handrails on both sides of the stairs and use them. Fix handrails that are broken or loose. Make sure that handrails are as long as the stairways. Check any carpeting to make sure that it is firmly attached to the stairs. Fix any carpet that is loose or worn. Avoid having throw rugs at the top or bottom of the stairs. If you do have throw rugs, attach them to the floor with carpet tape. Make sure that you have a light switch at the top of the stairs and the bottom of the stairs. If you do not have them, ask someone to add them for you. What else can I do to help prevent falls? Wear shoes that: Do not have high heels. Have rubber bottoms. Are comfortable and fit you well. Are closed at the toe. Do not wear sandals. If you use a stepladder: Make sure that it is fully opened. Do not climb a closed stepladder. Make sure that both sides of the  stepladder are locked into place. Ask someone to hold it for you, if possible. Clearly mark and make sure that you can see: Any grab bars or handrails. First and last steps. Where the edge of each step is. Use tools that help you move around (mobility aids) if they are needed. These include: Canes. Walkers. Scooters. Crutches. Turn on the lights when you go into a dark area. Replace any light bulbs as soon as they burn out. Set up your furniture so you have a clear path. Avoid moving your furniture around. If any of your floors are uneven, fix them. If there are any pets around you, be aware of where they are. Review your medicines with your doctor. Some medicines can make you feel dizzy. This can increase your chance of falling. Ask your doctor what other things that you can do to help prevent falls. This information is not intended to replace advice given to you by your health care provider. Make sure you discuss any questions you have with your health care provider. Document Released: 05/22/2009 Document Revised: 01/01/2016 Document Reviewed: 08/30/2014 Elsevier Interactive Patient Education  2017 [employer].

## 2023-02-15 NOTE — Progress Notes (Signed)
Subjective:   Ryan [name] is a 77 y.o. male who presents for Medicare Annual/Subsequent preventive examination.  Visit Complete: In person   Review of Systems     Cardiac Risk Factors include: advanced age (>14men, >52 women);hypertension;male gender;dyslipidemia;diabetes mellitus     Objective:    Today's Vitals   02/15/23 1439 02/15/23 1453  BP: (!) 146/79 (!) 150/78  Pulse: 77 71  SpO2: 99%   Weight: 165 lb (74.8 kg)   Height: 5\' 8"  (1.727 m)    Body mass index is 25.09 kg/m.     02/15/2023    2:48 PM 12/09/2022    2:00 PM 12/07/2022    4:03 PM 02/08/2022    1:29 PM 02/02/2021    3:11 PM 01/28/2020   10:19 AM 07/30/2019   11:37 AM  Advanced Directives  Does Patient Have a Medical Advance Directive? Yes No No Yes Yes No Yes  Type of Estate agent of [place];Living will   Healthcare Power of [employer] of [place];Living will  Healthcare Power of [place];Living will  Does patient want to make changes to medical advance directive? No - Patient declined      Yes (MAU/Ambulatory/Procedural Areas - Information given)  Copy of Healthcare Power of Attorney in Chart? No - copy requested   No - copy requested No - copy requested  No - copy requested  Would patient like information on creating a medical advance directive?  No - Patient declined No - Patient declined   No - Patient declined     Current Medications (verified) Outpatient Encounter Medications as of 02/15/2023  Medication Sig   alendronate (FOSAMAX) 70 MG tablet Take 1 tablet (70 mg total) by mouth every 7 (seven) days. Take with a full glass of water on an empty stomach.   apixaban (ELIQUIS) 5 MG TABS tablet Take 1 tablet (5 mg total) by mouth 2 (two) times daily.   atorvastatin (LIPITOR) 40 MG tablet TAKE 1 TABLET (40 MG TOTAL) BY MOUTH DAILY.   Calcium Carbonate-Vitamin D 600-400 MG-UNIT tablet Take 1 tablet by mouth 2 (two) times daily. Reported on 01/27/2016   cholecalciferol  (VITAMIN D3) 25 MCG (1000 UNIT) tablet Take 1,000 Units by mouth daily.   Glucagon, rDNA, (GLUCAGON EMERGENCY) 1 MG KIT Inject 1 mg into the muscle once as needed for up to 1 dose. (Patient taking differently: Inject 1 mg into the muscle as needed (blood sugar below 45).)   insulin aspart (NOVOLOG) 100 UNIT/ML injection Inject 2-3 Units into the skin 3 (three) times daily before meals.   insulin degludec (TRESIBA FLEXTOUCH) 100 UNIT/ML FlexTouch Pen Inject 8 Units into the skin daily. Will start in 2024 after he completes current supply of Levemir. (Patient taking differently: Inject 8 Units into the skin daily.)   Insulin Syringe-Needle U-100 (TRUEPLUS INSULIN SYRINGE) 31G X 5/16" 0.5 ML MISC USE TO INJECT INSULIN 4 TIMES A DAY   metoprolol succinate (TOPROL-XL) 25 MG 24 hr tablet TAKE 1 TABLET (25 MG TOTAL) BY MOUTH DAILY. (Patient taking differently: Take 25 mg by mouth daily.)   [DISCONTINUED] Investigational - Study Medication Take 1 tablet by mouth daily. TTI-101   [DISCONTINUED] ondansetron (ZOFRAN-ODT) 4 MG disintegrating tablet Take 1 tablet (4 mg total) by mouth every 8 (eight) hours as needed for nausea or vomiting.   [DISCONTINUED] pantoprazole (PROTONIX) 40 MG tablet Take 1 tablet (40 mg total) by mouth daily.   No facility-administered encounter medications on file as of 02/15/2023.  Allergies (verified) Other and Lisinopril   History: Past Medical History:  Diagnosis Date   Allergy    Arthritis    Diabetes mellitus    GERD (gastroesophageal reflux disease)    History of chicken pox    Hyperlipidemia    Hypertension    IPF (idiopathic pulmonary fibrosis) (HCC)    Myocardial infarction (HCC) 02/24/2016   Osteoporosis    Type 2 diabetes mellitus with hyperglycemia, with long-term current use of insulin (HCC) 09/08/2015   Past Surgical History:  Procedure Laterality Date   EYE SURGERY  1998   bilateral   SHOULDER SURGERY  1990   right shoulder, torn rotator cuff.   Family  History  Problem Relation Age of Onset   Cancer Mother        cancer   Heart disease Mother    Hyperlipidemia Mother    Hypertension Mother    Hyperlipidemia Father    Stroke Father    Hypertension Father    Diabetes Paternal Grandfather    Colon cancer Neg Hx    Stomach cancer Neg Hx    Esophageal cancer Neg Hx    Social History   Socioeconomic History   Marital status: Widowed    Spouse name: Not on file   Number of children: 1   Years of education: Not on file   Highest education level: Not on file  Occupational History   Occupation: retired    Comment: from retail  Tobacco Use   Smoking status: Former    Types: Cigarettes    Quit date: 09/01/1965    Years since quitting: 57.4   Smokeless tobacco: Never   Tobacco comments:    Smoked about 1cig a week for about 6 months.  Vaping Use   Vaping Use: Never used  Substance and Sexual Activity   Alcohol use: No   Drug use: No   Sexual activity: Never  Other Topics Concern   Not on file  Social History Narrative   Regular exercise:  5 x weekly   Caffeine Use: 1 cups coffee daily.   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   Son, daughter in law and young adult granddaughter and grandson live with client   Wife died from ovarian cancer in Feb 24, 2015         Social Determinants of Health   Financial Resource Strain: Low Risk  (02/15/2023)   Overall Financial Resource Strain (CARDIA)    Difficulty of Paying Living Expenses: Not hard at all  Food Insecurity: No Food Insecurity (12/13/2022)   Hunger Vital Sign    Worried About Running Out of Food in the Last Year: Never true    Ran Out of Food in the Last Year: Never true  Transportation Needs: No Transportation Needs (12/13/2022)   PRAPARE - Administrator, Civil Service (Medical): No    Lack of Transportation (Non-Medical): No  Physical Activity: Insufficiently Active (02/15/2023)   Exercise Vital Sign    Days of Exercise per Week: 3 days    Minutes of Exercise per  Session: 30 min  Stress: No Stress Concern Present (02/15/2023)   [employer] of Occupational Health - Occupational Stress Questionnaire    Feeling of Stress : Not at all  Social Connections: Socially Isolated (02/15/2023)   Social Connection and Isolation Panel [NHANES]    Frequency of Communication with Friends and Family: Twice a week    Frequency of Social Gatherings with Friends and Family: Never    Attends Religious Services: Never  Active Member of Clubs or Organizations: No    Attends Banker Meetings: Never    Marital Status: Widowed    Tobacco Counseling Counseling given: Not Answered Tobacco comments: Smoked about 1cig a week for about 6 months.   Clinical Intake:  Pre-visit preparation completed: Yes  Pain : No/denies pain  BMI - recorded: 25.09 Nutritional Status: BMI 25 -29 Overweight Nutritional Risks: None Diabetes: Yes CBG done?: No Did pt. bring in CBG monitor from home?: No  How often do you need to have someone help you when you read instructions, pamphlets, or other written materials from your doctor or pharmacy?: 1 - Never  Interpreter Needed?: No  Information entered by :: Donne Anon, CMA   Activities of Daily Living    02/15/2023    2:41 PM 12/09/2022    2:00 PM  In your present state of health, do you have any difficulty performing the following activities:  Hearing? 0 0  Vision? 0 0  Difficulty concentrating or making decisions? 0 0  Walking or climbing stairs? 1 1  Dressing or bathing? 0 1  Doing errands, shopping? 0 0  Preparing Food and eating ? N   Using the Toilet? N   In the past six months, have you accidently leaked urine? N   Do you have problems with loss of bowel control? N   Managing your Medications? N   Managing your Finances? N   Housekeeping or managing your Housekeeping? N     Patient Care Team: Sandford Craze, NP as PCP - General (Internal Medicine) Pricilla Riffle, MD as PCP - Cardiology  (Cardiology) Carlus Pavlov, MD as Consulting Physician (Endocrinology) Pricilla Riffle, MD as Consulting Physician (Cardiology) Henrene Pastor, RPH-CPP (Pharmacist) Kalman Shan, MD as Consulting Physician (Pulmonary Disease) Kalman Shan, MD as Consulting Physician (Pulmonary Disease)  Indicate any recent Medical Services you may have received from other than [facility] providers in the past year (date may be approximate).     Assessment:   This is a routine wellness examination for [employer].  Hearing/Vision screen No results found.  Dietary issues and exercise activities discussed:     Goals Addressed   None    Depression Screen    02/15/2023    2:48 PM 02/08/2022    1:31 PM 02/02/2021    3:15 PM 01/23/2021   10:06 AM 01/28/2020   10:26 AM 01/21/2020    3:04 PM 03/06/2019   10:17 AM  PHQ 2/9 Scores  PHQ - 2 Score 0 0 0 0 0 0 0    Fall Risk    02/15/2023    2:43 PM 02/08/2022    1:30 PM 02/02/2021    3:14 PM 01/23/2021   10:05 AM 01/28/2020   10:26 AM  Fall Risk   Falls in the past year? 1 0 0 0 0  Comment blood sugar bottomed out to 40      Number falls in past yr: 0 0 0 0 0  Injury with Fall? 0 0 0 0 0  Risk for fall due to : Other (Comment)      Follow up Falls evaluation completed Falls prevention discussed Falls prevention discussed  Education provided;Falls prevention discussed    MEDICARE RISK AT HOME:  Medicare Risk at Home - 02/15/23 1443     Any stairs in or around the home? Yes    If so, are there any without handrails? Yes    Home free of loose throw rugs in walkways,  pet beds, electrical cords, etc? Yes    Adequate lighting in your home to reduce risk of falls? Yes    Life alert? No    Use of a cane, walker or w/c? No    Grab bars in the bathroom? Yes    Shower chair or bench in shower? Yes    Elevated toilet seat or a handicapped toilet? No             TIMED UP AND GO:  Was the test performed?  Yes  Length of time to ambulate 10 feet: 6  sec Gait steady and fast without use of assistive device    Cognitive Function:    11/29/2017    9:18 AM 11/22/2016    9:33 AM  MMSE - Mini Mental State Exam  Orientation to time 5 5  Orientation to Place 5 5  Registration 3 3  Attention/ Calculation 5 5  Recall 3 2  Language- name 2 objects 2 2  Language- repeat 1 1  Language- follow 3 step command 3 3  Language- read & follow direction 1 1  Write a sentence 1 1  Copy design 1 0  Total score 30 28        02/15/2023    2:51 PM  6CIT Screen  What Year? 0 points  What month? 0 points  What time? 0 points  Count back from 20 0 points  Months in reverse 0 points  Repeat phrase 0 points  Total Score 0 points    Immunizations Immunization History  Administered Date(s) Administered   Fluad Quad(high Dose 65+) 05/15/2019, 04/25/2020, 06/24/2021, 05/28/2022   H1N1 11/19/2008   Hepb-cpg 12/29/2021, 03/02/2022   Influenza Split 05/25/2011, 04/20/2012   Influenza Whole 06/06/2009   Influenza, High Dose Seasonal PF 04/20/2016, 05/12/2017, 05/26/2018   Influenza,inj,Quad PF,6+ Mos 05/07/2013, 05/27/2014, 05/13/2015   Influenza-Unspecified 05/09/2004, 06/09/2005, 06/15/2006, 07/11/2007, 05/15/2008, 06/06/2009   PFIZER(Purple Top)SARS-COV-2 Vaccination 09/30/2019, 10/24/2019, 07/22/2020   Pneumococcal Conjugate-13 10/19/2004, 05/11/2013   Pneumococcal Polysaccharide-23 05/25/2011, 09/08/2018   Td 08/09/2001, 08/25/2011   Zoster Recombinant(Shingrix) 01/22/2021, 05/20/2021   Zoster, Live 08/25/2011    TDAP status: Due, Education has been provided regarding the importance of this vaccine. Advised may receive this vaccine at local pharmacy or Health Dept. Aware to provide a copy of the vaccination record if obtained from local pharmacy or Health Dept. Verbalized acceptance and understanding.  Flu Vaccine status: Up to date  Pneumococcal vaccine status: Up to date  Covid-19 vaccine status: Information provided on how to obtain  vaccines.   Qualifies for Shingles Vaccine? Yes   Zostavax completed Yes   Shingrix Completed?: Yes  Screening Tests Health Maintenance  Topic Date Due   DTaP/Tdap/Td (3 - Tdap) 08/24/2021   COVID-19 Vaccine (4 - 2023-24 season) 04/09/2022   OPHTHALMOLOGY EXAM  01/07/2023   Medicare Annual Wellness (AWV)  02/09/2023   INFLUENZA VACCINE  03/10/2023   HEMOGLOBIN A1C  04/01/2023   FOOT EXAM  05/29/2023   Diabetic kidney evaluation - Urine ACR  09/02/2023   Diabetic kidney evaluation - eGFR measurement  12/14/2023   Pneumonia Vaccine 16+ Years old  Completed   Hepatitis C Screening  Completed   Zoster Vaccines- Shingrix  Completed   HPV VACCINES  Aged Out   Fecal DNA (Cologuard)  Discontinued    Health Maintenance  Health Maintenance Due  Topic Date Due   DTaP/Tdap/Td (3 - Tdap) 08/24/2021   COVID-19 Vaccine (4 - 2023-24 season) 04/09/2022   OPHTHALMOLOGY  EXAM  01/07/2023   Medicare Annual Wellness (AWV)  02/09/2023    Colorectal cancer screening: Type of screening: Cologuard. Completed 01/11/22. Repeat every N/a years  Lung Cancer Screening: (Low Dose CT Chest recommended if Age 7-80 years, 20 pack-year currently smoking OR have quit w/in 15years.) does not qualify.   Additional Screening:  Hepatitis C Screening: does qualify; Completed 12/07/21  Vision Screening: Recommended annual ophthalmology exams for early detection of glaucoma and other disorders of the eye. Is the patient up to date with their annual eye exam?  Yes  Who is the provider or what is the name of the office in which the patient attends annual eye exams? Dr. Dione Booze If pt is not established with a provider, would they like to be referred to a provider to establish care? No .   Dental Screening: Recommended annual dental exams for proper oral hygiene  Diabetic Foot Exam: Diabetic Foot Exam: Completed 05/28/22  Community Resource Referral / Chronic Care Management: CRR required this visit?  No   CCM  required this visit?  No     Plan:     I have personally reviewed and noted the following in the patient's chart:   Medical and social history Use of alcohol, tobacco or illicit drugs  Current medications and supplements including opioid prescriptions. Patient is not currently taking opioid prescriptions. Functional ability and status Nutritional status Physical activity Advanced directives List of other physicians Hospitalizations, surgeries, and ER visits in previous 12 months Vitals Screenings to include cognitive, depression, and falls Referrals and appointments  In addition, I have reviewed and discussed with patient certain preventive protocols, quality metrics, and best practice recommendations. A written personalized care plan for preventive services as well as general preventive health recommendations were provided to patient.     Donne Anon, CMA   02/15/2023   After Visit Summary: Sent to mychart.  Nurse Notes: None

## 2023-02-21 ENCOUNTER — Other Ambulatory Visit (HOSPITAL_BASED_OUTPATIENT_CLINIC_OR_DEPARTMENT_OTHER): Payer: Self-pay

## 2023-02-24 ENCOUNTER — Encounter: Payer: PPO | Admitting: Internal Medicine

## 2023-02-25 ENCOUNTER — Encounter: Payer: PPO | Admitting: Internal Medicine

## 2023-02-28 ENCOUNTER — Encounter: Payer: PPO | Admitting: Internal Medicine

## 2023-02-28 DIAGNOSIS — J84112 Idiopathic pulmonary fibrosis: Secondary | ICD-10-CM

## 2023-02-28 DIAGNOSIS — Z006 Encounter for examination for normal comparison and control in clinical research program: Secondary | ICD-10-CM

## 2023-02-28 NOTE — Progress Notes (Signed)
Maxamillion L. Ranganathan , DOB 09/30/1945, was seen as subject in a clinical trial /Protocol # TVD-101-003P for end of study visit. Cardiopulmonary symptoms are stable Other or new symptoms denied. Pertinent physical findings include: intermittent R base systolic murmur; slightly accentuated S2; slight clubbing of nailbeds;coarse rales @ lung bases R > L;bronchovesicular breath sounds superiorly. All physical findings NCS                                                                     Pecola Lawless MD,SI

## 2023-02-28 NOTE — Research (Cosign Needed)
TTitle: A Phase 2 Multicenter, Randomized, Double-blind, Placebo Controlled Study to Evaluate the Safety, Tolerability, Pharmacokinetics, and Efficacy of TTI-101 in Participants with Idiopathic Pulmonary Fibrosis    Dose and Duration of Treatment: 100 participants will be enrolled using a 1:1:1:1 randomization ratio over a 20 week period (4 week screening period, 12 week treatment period and a 4 week follow-up period).   25 participants: TTI-101 400 mg/day   25 participants: TTI-101 800 mg/day   25 participants: TTI-101 1200 mg/day   25 participants: placebo   Protocol # TVD-101-003P    Sponsor: [employer], [address], [address] [place], [place] [address] [place]  Protocol Version/Amendment: Amendment 3.0, date  10Feb 2023,  for 02/28/2023    Consent Version: ver.Main advarra approved [phone]  for 02/28/2023   Investigator Brochure Version: Edition Number 7.0    Investigator Brochure Product: TTI-101        Mechanism of action: TTI-101 is a small-molecule inhibitor of STAT3 (signal transducer and activator of transcription 3). STAT3 plays a major role in the cellular processes involved in fibrosis including fibroblast proliferation. TTI-101 binds tightly to STAT3 which prevents STAT3 recruitment to signaling complexes that contain activated tyrosine kinases, thereby preventing STAT3 phosphorylation on Y705 and STAT3 activation. Binding of TTI-101 to STAT3 also prevents STAT3 dimerization. As such, TTI-101 is expected to prevent or reverse progression of fibrosis in IPF patients.  Key Inclusion Criteria: Age ? 40 years  Diagnosed with IPF based on the 2018 or 2022 ATS/ERS/JRS/ALAT guidelines within 5 years  Chest HRCT performed within 12 months meeting requirements for IPF Extent of fibrosis > emphysematous changes on the HRCT Meeting all the following during screening confirmed by central review,   >45% of predicted FVC and a (FEV1)/FVC ?0.7  A predicted DLCO  (Hb corrected) >30%  SpO2 ? 88% with up to 2L O2/min by pulse oximetry at rest  If currently receiving nintedanib, stable dose for >3 months  Key Exclusion Criteria: Known to have the following diseases at screening: Uncontrolled pulmonary hypertension, or pulmonary hypertension requiring chronic medical treatment Congestive heart failure - NYHA Class III or IV   Drug-induced interstitial pneumonia, pneumoconiosis, hypersensitivity pneumonitis, or radiation pneumonitis   Collagen vascular disease, autoimmune disease, sarcoidosis, granulomatous disease Severe hypertension (BP ? 160/100 despite maximal therapy within 3 months of visit 1) Myocardial infarction, unstable cardiac angina, or history of thrombotic event within 6 months of screening QTcF >450 msec for men and >470 msec for women at screening.   History of significant/symptomatic bradycardia long QT syndrome or  Uncorrected hypomagnesemia or hypokalemia or Heart rate <50 bpm at rest   Unresolved respiratory tract infection within 4 weeks or an acute exacerbation of IPF within 3 months  Active cancer or a history of cancer with a significant risk of recurrence during the study.  eGFR ?62mL/min/1.73 m2, Hb?10g/dL, MVH<8469/GE9 , platelets <100,000/mm3 , serum total bilirubin >1.5 x ULN, liver enzymes ? 2 x ULN Receiving steroids > 10 mg/day of prednisolone or its equivalent within 2 weeks   Immunosuppressive agents within 4 weeks  Received pirfenidone within 3 months, smoking within 3 months   Pharmacokinetics: (ADME) - Contraindications: The SEDD formulation for TTI-101 provided greater systemic exposures relative to the Labrasol/PEG formulation. The SEDD formulation decreased the pill burden while increasing systemic exposures. The half-life of the drug in participants generally ranged from 4-8 h. The half-life data are supportive of the BID dose schedule. The administration of TTI-101 is limited to investigational  use only and  limited to the oral route of administration. TTI-101 is contraindicated for use in participants with known hypersensitivity to any of the excipients. Special Warnings/Considerations: Insufficient experience exists with TTI-101 to provide comprehensive warning guidance beyond what is standard for any investigational drug.  Interactions:  Studies indicated the potential for strong induction of CYP1A2 by TTI-101. As expected, TTI-101 did induce metabolism of pirfenidone, known to be primarily metabolized by CYP1A2. There was no interaction shown between TTI-101 and nintedanib. Drug Interaction Studies: [facility] conducted a DDI study in normal healthy volunteers given TTI-101 and either pirfenidone or nintedanib. Serious Adverse Reactions Observed in Clinical studies: 32 participants out of 105 reported SAE. (30.5%). However all SAEs reported were from study 2016-0842 (participants with solid tumors). No SAEs were reported in the DDI study (Healthy volunteer). Majority of SAE were GI disorders.  Serious Adverse Reactions Observed Postmarketing: To date, TTI-101 has not been registered for use or marketed in any jurisdiction. Safety Data:  The safety data from study 2016-0842 demonstrates the most frequent AEs are GI-related ( nausea, vomiting, change in appetite, diarrhea, abdominal pain) and related to liver function (elevated AST and ALT).  Fewer AEs were reported with the SDD formulation. EKG - PR prolongation was seen in 1 out of 105 participants. (1%)    Overall Adverse events in total participants N=105 (%) Severity  Grade1-mild Grade2-Moderate Grade3-Severe Formulation 1  N=15 Formulation 2    N=47 Formulation 3 (SDD)  N=48*  Diarrhea 30 (28.6%) 24 (22.9%) 7 (6.7%) 8 (7.6%) 6 (40.0%) 21 (44.7%) 3 (6.3%)  Nausea 14 (13.3%) 9 (8.6%) 4 (3.8%) 1 (1.0%) 4 (26.7%) 7 (14.9%) 3 (6.3%)  Vomiting 7 (6.7%) 5 (4.8%) 2 (1.9%) - 3 (20.0%) 2 (4.3%) 2 (4.2%)   Abdominal pain 3 (2.9%) 2 (1.9%) 1  (1.0%) - 3 (20.0%) - -  Elevated ALT 7 (6.7%)   4 (3.8%)   3 (2.9%)   3 (2.9%) 2 (13.3%)  4 (8.5%)   1 (2.1%)   Elevated AST 6 (5.7%)   2 (1.9%)   4 (3.8%)   3 (2.9%) 1 (6.7%)  4 (8.5%)  1 (2.1%)   Headache 5 (4.8%) 5 (4.8%) - - - - 5 (10.4%)    TEAE from DDI study alone with Healthy Volunteer N=41 Part 1 nintedanib Part 2 Pirfenidone  Diarrhea  2 (4.9%)     1 (4.8%)  1 (5.0%)  Nausea 2 (4.9%)   1 (4.8%)   1 (5.0%)   Vomiting 1 (2.4%)     -  1 (5.0%)   Abdominal distension 1 (2.4%)     1 (4.8%)  -  Headache 5 (12.2%)   4 (19.0%)  1 (5.0%)    Stability:  The recommended storage condition for TTI-101 tablet, 200 mg and matching placebo is room temperature (20C to 25C or 87F to 73F). Excursions between 15C and 30C (53F and 51F) are allowed.     [facility] [place] Clinical Research Coordinator note:   This visit for Subject Ryan [name] with DOB: [birth year] on 02/28/2023 for the above protocol is Visit/Encounter # 5  and is for purpose of research.   The consent for this encounter is under Protocol Version 3.0, Investigator Brochure Version 7.0, Consent Version [id number] and  is currently IRB approved.   Subject expressed continued interest and consent in continuing as a study subject. Subject confirmed that there was    change in contact information (e.g. address, telephone, email). Subject thanked for participation  in research and contribution to science. In this visit 02/28/2023 the subject will be evaluated by Sub-Investigator named Dr. Marga Melnick. This research coordinator has verified that the above investigator is up to date with his/her training logs.   The Subject was informed that the PI  continues to have oversight of the subject's visits and course through relevant discussions, reviews, and also specifically of this visit by routing of this note to the PI.  This visit is a key visit of  end of study. The PI is not available for this visit.    This is the  final visit for the subject in this study since IP was discontinued at visit 2.  Please see subject binder for futher details.    Signed by  Christell Constant MD  Clinical Research Coordinator  [facility]  [place], [place] 1:02 PM 02/28/2023

## 2023-03-01 ENCOUNTER — Encounter: Payer: Self-pay | Admitting: Internal Medicine

## 2023-03-01 ENCOUNTER — Ambulatory Visit (INDEPENDENT_AMBULATORY_CARE_PROVIDER_SITE_OTHER): Payer: PPO | Admitting: Internal Medicine

## 2023-03-01 ENCOUNTER — Other Ambulatory Visit (HOSPITAL_BASED_OUTPATIENT_CLINIC_OR_DEPARTMENT_OTHER): Payer: Self-pay

## 2023-03-01 VITALS — BP 138/80 | HR 65 | Ht 68.0 in | Wt 166.6 lb

## 2023-03-01 DIAGNOSIS — E1165 Type 2 diabetes mellitus with hyperglycemia: Secondary | ICD-10-CM | POA: Diagnosis not present

## 2023-03-01 DIAGNOSIS — E663 Overweight: Secondary | ICD-10-CM

## 2023-03-01 DIAGNOSIS — E785 Hyperlipidemia, unspecified: Secondary | ICD-10-CM

## 2023-03-01 DIAGNOSIS — E1159 Type 2 diabetes mellitus with other circulatory complications: Secondary | ICD-10-CM | POA: Diagnosis not present

## 2023-03-01 DIAGNOSIS — Z794 Long term (current) use of insulin: Secondary | ICD-10-CM | POA: Diagnosis not present

## 2023-03-01 LAB — POCT GLYCOSYLATED HEMOGLOBIN (HGB A1C): Hemoglobin A1C: 7.1 % — AB (ref 4.0–5.6)

## 2023-03-01 MED ORDER — DEXCOM G7 RECEIVER DEVI
1.0000 | Freq: Once | 0 refills | Status: AC
  Filled 2023-03-01: qty 1, 30d supply, fill #0

## 2023-03-01 MED ORDER — DEXCOM G7 SENSOR MISC
3.0000 | 4 refills | Status: DC
  Filled 2023-03-01: qty 9, 90d supply, fill #0
  Filled 2023-06-06: qty 9, 90d supply, fill #1
  Filled 2023-08-26 – 2023-09-05 (×2): qty 9, 90d supply, fill #2
  Filled 2023-11-28: qty 9, 90d supply, fill #3
  Filled 2024-02-21 (×2): qty 9, 90d supply, fill #4

## 2023-03-01 MED ORDER — TRESIBA FLEXTOUCH 100 UNIT/ML ~~LOC~~ SOPN: 6.0000 [IU] | PEN_INJECTOR | Freq: Every day | SUBCUTANEOUS | Status: DC
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
## 2023-03-01 NOTE — Patient Instructions (Addendum)
Please change: - Tresiba 6 units at bedtime  Continue: - Novolog mealtime 2-3 units only before meals  Try to start the Dexcom G7 sensor. Download the Dexcom G7 and the Clarity app and stay logged on in both. To allow [place] to see the data, share the information with the clinic: [phone].  Please return in 4 months with your sugar log.

## 2023-03-02 ENCOUNTER — Encounter: Payer: Self-pay | Admitting: Family

## 2023-03-02 ENCOUNTER — Ambulatory Visit (INDEPENDENT_AMBULATORY_CARE_PROVIDER_SITE_OTHER): Payer: PPO | Admitting: Family

## 2023-03-02 VITALS — BP 120/75 | HR 60 | Ht 68.0 in | Wt 166.2 lb

## 2023-03-02 DIAGNOSIS — D649 Anemia, unspecified: Secondary | ICD-10-CM | POA: Diagnosis not present

## 2023-03-02 DIAGNOSIS — I10 Essential (primary) hypertension: Secondary | ICD-10-CM

## 2023-03-02 DIAGNOSIS — Z794 Long term (current) use of insulin: Secondary | ICD-10-CM

## 2023-03-02 DIAGNOSIS — D509 Iron deficiency anemia, unspecified: Secondary | ICD-10-CM | POA: Diagnosis not present

## 2023-03-02 DIAGNOSIS — J84112 Idiopathic pulmonary fibrosis: Secondary | ICD-10-CM | POA: Diagnosis not present

## 2023-03-02 DIAGNOSIS — E7800 Pure hypercholesterolemia, unspecified: Secondary | ICD-10-CM | POA: Diagnosis not present

## 2023-03-02 DIAGNOSIS — E118 Type 2 diabetes mellitus with unspecified complications: Secondary | ICD-10-CM | POA: Diagnosis not present

## 2023-03-02 DIAGNOSIS — M810 Age-related osteoporosis without current pathological fracture: Secondary | ICD-10-CM

## 2023-03-02 LAB — CBC WITH DIFFERENTIAL/PLATELET
Basophils Absolute: 0.1 10*3/uL (ref 0.0–0.1)
Basophils Relative: 0.6 % (ref 0.0–3.0)
Eosinophils Absolute: 0.4 10*3/uL (ref 0.0–0.7)
Eosinophils Relative: 4.9 % (ref 0.0–5.0)
HCT: 42.6 % (ref 39.0–52.0)
Hemoglobin: 13.7 g/dL (ref 13.0–17.0)
Lymphocytes Relative: 24.9 % (ref 12.0–46.0)
Lymphs Abs: 2 10*3/uL (ref 0.7–4.0)
MCHC: 32.1 g/dL (ref 30.0–36.0)
MCV: 93.1 fl (ref 78.0–100.0)
Monocytes Absolute: 1.3 10*3/uL — ABNORMAL HIGH (ref 0.1–1.0)
Monocytes Relative: 16.3 % — ABNORMAL HIGH (ref 3.0–12.0)
Neutro Abs: 4.2 10*3/uL (ref 1.4–7.7)
Neutrophils Relative %: 53.3 % (ref 43.0–77.0)
Platelets: 274 10*3/uL (ref 150.0–400.0)
RBC: 4.58 Mil/uL (ref 4.22–5.81)
RDW: 15.3 % (ref 11.5–15.5)
WBC: 7.9 10*3/uL (ref 4.0–10.5)

## 2023-03-02 LAB — LIPID PANEL
Cholesterol: 131 mg/dL (ref 0–200)
HDL: 44.7 mg/dL (ref >39.00)
LDL Cholesterol: 55 mg/dL (ref 0–99)
NonHDL: 86.75
Total CHOL/HDL Ratio: 3
Triglycerides: 159 mg/dL — ABNORMAL HIGH (ref 0.0–149.0)
VLDL: 31.8 mg/dL (ref 0.0–40.0)

## 2023-03-02 LAB — GLUCOSE, FASTING: Glucose, Bld: 149 mg/dL — ABNORMAL HIGH (ref 65–99)

## 2023-03-02 LAB — C-PEPTIDE: C-Peptide: <0.1 ng/mL — ABNORMAL LOW (ref 0.80–3.85)

## 2023-03-02 NOTE — Progress Notes (Signed)
Subjective:     Patient ID: Ryan [name], male    DOB: 1946/07/15, 77 y.o.   MRN: [id number]  Chief Complaint  Patient presents with   Follow-up    No concerns     HPI  Discussed the use of AI scribe software for clinical note transcription with the patient, who gave verbal consent to proceed.  History of Present Illness   The patient, with a history of Diabetes, and IPF, presents for a routine check-up. He reports that his A1c was 7.1 yesterday, which is within the target range set by his endocrinologist. He is currently on Tresiba and NovoLog for diabetes management, with 2-3 units of NovoLog before meals. He has been experiencing hypoglycemia at night, and his endocrinologist is investigating whether his pancreas is still producing any insulin.  The patient is also on Fosamax for bone health, and his last bone density test was in February. He also reports participating in a research study for his IPF, but is considering discontinuing due to the side effects of the medications on his diabetes.   He denies any recent reflux symptoms. His blood pressure was slightly elevated at today's visit, and he is on metoprolol for blood pressure management. He also reports occasional coughing due to his IPF.     DM2-  Lab Results  Component Value Date   HGBA1C 7.3 (A) 10/01/2022   HGBA1C 7.0 (A) 05/28/2022   HGBA1C 7.2 (H) 10/27/2021   Lab Results  Component Value Date   MICROALBUR 1.5 09/01/2022   LDLCALC 79 06/02/2022   CREATININE 0.90 12/14/2022        Health Maintenance Due  Topic Date Due   DTaP/Tdap/Td (3 - Tdap) 08/24/2021   COVID-19 Vaccine (4 - 2023-24 season) 04/09/2022   OPHTHALMOLOGY EXAM  01/07/2023    Past Medical History:  Diagnosis Date   Allergy    Arthritis    Diabetes mellitus    GERD (gastroesophageal reflux disease)    History of chicken pox    Hyperlipidemia    Hypertension    IPF (idiopathic pulmonary fibrosis) (HCC)    Myocardial infarction  (HCC) 2017   Osteoporosis    Type 2 diabetes mellitus with hyperglycemia, with long-term current use of insulin (HCC) 09/08/2015    Past Surgical History:  Procedure Laterality Date   EYE SURGERY  1998   bilateral   SHOULDER SURGERY  1990   right shoulder, torn rotator cuff.    Family History  Problem Relation Age of Onset   Cancer Mother        cancer   Heart disease Mother    Hyperlipidemia Mother    Hypertension Mother    Hyperlipidemia Father    Stroke Father    Hypertension Father    Diabetes Paternal Grandfather    Colon cancer Neg Hx    Stomach cancer Neg Hx    Esophageal cancer Neg Hx     Social History   Socioeconomic History   Marital status: Widowed    Spouse name: Not on file   Number of children: 1   Years of education: Not on file   Highest education level: Not on file  Occupational History   Occupation: retired    Comment: from retail  Tobacco Use   Smoking status: Former    Current packs/day: 0.00    Types: Cigarettes    Quit date: 09/01/1965    Years since quitting: 57.5   Smokeless tobacco: Never   Tobacco comments:  Smoked about 1cig a week for about 6 months.  Vaping Use   Vaping status: Never Used  Substance and Sexual Activity   Alcohol use: No   Drug use: No   Sexual activity: Never  Other Topics Concern   Not on file  Social History Narrative   Regular exercise:  5 x weekly   Caffeine Use: 1 cups coffee daily.   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   Son, daughter in law and young adult granddaughter and grandson live with client   Wife died from ovarian cancer in 03/31/2015         Social Determinants of Health   Financial Resource Strain: Low Risk  (02/15/2023)   Overall Financial Resource Strain (CARDIA)    Difficulty of Paying Living Expenses: Not hard at all  Food Insecurity: No Food Insecurity (12/13/2022)   Hunger Vital Sign    Worried About Running Out of Food in the Last Year: Never true    Ran Out of Food in the Last Year:  Never true  Transportation Needs: No Transportation Needs (12/13/2022)   PRAPARE - Administrator, Civil Service (Medical): No    Lack of Transportation (Non-Medical): No  Physical Activity: Insufficiently Active (02/15/2023)   Exercise Vital Sign    Days of Exercise per Week: 3 days    Minutes of Exercise per Session: 30 min  Stress: No Stress Concern Present (02/15/2023)   [employer] of Occupational Health - Occupational Stress Questionnaire    Feeling of Stress : Not at all  Social Connections: Socially Isolated (02/15/2023)   Social Connection and Isolation Panel [NHANES]    Frequency of Communication with Friends and Family: Twice a week    Frequency of Social Gatherings with Friends and Family: Never    Attends Religious Services: Never    Database administrator or Organizations: No    Attends Banker Meetings: Never    Marital Status: Widowed  Intimate Partner Violence: Not At Risk (12/09/2022)   Humiliation, Afraid, Rape, and Kick questionnaire    Fear of Current or Ex-Partner: No    Emotionally Abused: No    Physically Abused: No    Sexually Abused: No    Outpatient Medications Prior to Visit  Medication Sig Dispense Refill   alendronate (FOSAMAX) 70 MG tablet Take 1 tablet (70 mg total) by mouth every 7 (seven) days. Take with a full glass of water on an empty stomach. 12 tablet 4   apixaban (ELIQUIS) 5 MG TABS tablet Take 1 tablet (5 mg total) by mouth 2 (two) times daily. 60 tablet 5   atorvastatin (LIPITOR) 40 MG tablet TAKE 1 TABLET (40 MG TOTAL) BY MOUTH DAILY. 90 tablet 1   Calcium Carbonate-Vitamin D 600-400 MG-UNIT tablet Take 1 tablet by mouth 2 (two) times daily. Reported on 01/27/2016     cholecalciferol (VITAMIN D3) 25 MCG (1000 UNIT) tablet Take 1,000 Units by mouth daily.     Continuous Glucose Receiver (DEXCOM G7 RECEIVER) DEVI Use as directed. 1 each 0   Continuous Glucose Sensor (DEXCOM G7 SENSOR) MISC Apply 1 sensor every 10 days. 9  each 4   insulin aspart (NOVOLOG) 100 UNIT/ML injection Inject 2-3 Units into the skin 3 (three) times daily before meals. 10 mL PRN   insulin degludec (TRESIBA FLEXTOUCH) 100 UNIT/ML FlexTouch Pen Inject 6-8 Units into the skin daily. Will start in 03/31/2023 after he completes current supply of Levemir.     Insulin Syringe-Needle U-100 (TRUEPLUS  INSULIN SYRINGE) 31G X 5/16" 0.5 ML MISC USE TO INJECT INSULIN 4 TIMES A DAY 300 each 3   metoprolol succinate (TOPROL-XL) 25 MG 24 hr tablet TAKE 1 TABLET (25 MG TOTAL) BY MOUTH DAILY. (Patient taking differently: Take 25 mg by mouth daily.) 90 tablet 1   No facility-administered medications prior to visit.    Allergies  Allergen Reactions   Other Other (See Comments)    Client states he became very sick and had significant skin inflammation and irritation with the Freestyle Libre sensor   Lisinopril     Cough     ROS    See HPI Objective:    Physical Exam Constitutional:      General: He is not in acute distress.    Appearance: He is well-developed.  HENT:     Head: Normocephalic and atraumatic.  Cardiovascular:     Rate and Rhythm: Normal rate and regular rhythm.     Heart sounds: No murmur heard. Pulmonary:     Effort: Pulmonary effort is normal. No respiratory distress.     Breath sounds: Examination of the right-lower field reveals rales. Examination of the left-lower field reveals rales. Rales present. No wheezing.  Skin:    General: Skin is warm and dry.  Neurological:     Mental Status: He is alert and oriented to person, place, and time.  Psychiatric:        Behavior: Behavior normal.        Thought Content: Thought content normal.      BP 120/75   Pulse 60   Ht 5\' 8"  (1.727 m)   Wt 166 lb 3.2 oz (75.4 kg)   SpO2 100%   BMI 25.27 kg/m  Wt Readings from Last 3 Encounters:  03/02/23 166 lb 3.2 oz (75.4 kg)  03/01/23 166 lb 9.6 oz (75.6 kg)  02/15/23 165 lb (74.8 kg)       Assessment & Plan:   Problem List  Items Addressed This Visit       Unprioritized   Osteoporosis    On Fosamax, last bone density scan in February 2024. -Continue Fosamax.      IPF (idiopathic pulmonary fibrosis) (HCC)    He is managed by pulmonology and reports overall stable.       Hyperlipidemia    Lab Results  Component Value Date   CHOL 146 06/02/2022   HDL 47.50 06/02/2022   LDLCALC 79 06/02/2022   TRIG 96.0 06/02/2022   CHOLHDL 3 06/02/2022   Due to update lipids.  Continue atorvastatin.        Relevant Orders   Lipid panel   HTN (hypertension)    BP Readings from Last 3 Encounters:  03/02/23 120/75  03/01/23 138/80  02/15/23 (!) 150/78   Repeat BP WNL, continue metoprolol.       Controlled diabetes mellitus type 2 with complications (HCC)    A1c of 7.1, stable on Tresiba and NovoLog. Reports occasional nocturnal hypoglycemia. -Continue current insulin regimen per Endocrinology.      Other Visit Diagnoses     Anemia, unspecified type    -  Primary   Relevant Orders   CBC w/Diff   Iron, TIBC and Ferritin Panel       I am having Ryan [name] maintain his Calcium Carbonate-Vitamin D, cholecalciferol, Insulin Syringe-Needle U-100, insulin aspart, metoprolol succinate, atorvastatin, alendronate, apixaban, Evaristo [employer] FlexTouch, Dexcom G7 Sensor, and [employer].  No orders of the defined types were placed in this  encounter.

## 2023-03-02 NOTE — Assessment & Plan Note (Signed)
A1c of 7.1, stable on [place] and NovoLog. Reports occasional nocturnal hypoglycemia. -Continue current insulin regimen per Endocrinology.

## 2023-03-02 NOTE — Assessment & Plan Note (Signed)
Lab Results  Component Value Date   CHOL 146 06/02/2022   HDL 47.50 06/02/2022   LDLCALC 79 06/02/2022   TRIG 96.0 06/02/2022   CHOLHDL 3 06/02/2022   Due to update lipids.  Continue atorvastatin.

## 2023-03-02 NOTE — Patient Instructions (Signed)
VISIT SUMMARY:  During your recent visit, we discussed your ongoing management of Type 1 diabetes, osteoporosis, idiopathic pulmonary fibrosis (IPF), hypertension, and a heart murmur. We also noted mild anemia and discussed your upcoming colonoscopy. Your blood sugar levels are within the target range, but you've been experiencing low blood sugar at night. Your bone health is being managed with Fosamax, and your IPF is currently stable. Your blood pressure was slightly high, and we will continue to monitor it. Your heart murmur has not presented any new symptoms. We will also check your blood count and iron levels due to mild anemia noted in May 2024.  YOUR PLAN:  - DIABETES: We will continue your current insulin regimen and defer any changes to Dr. Lafe Garin.  -OSTEOPOROSIS: This is a condition that weakens bones, making them fragile and more likely to break. We will continue your current medication, Fosamax.  -IDIOPATHIC PULMONARY FIBROSIS (IPF): This is a condition that causes lung tissue to become thick and stiff, making it hard for you to breathe. We will continue your current management.  -HYPERTENSION: This is a condition where the force of the blood against the artery walls is too high. We will continue your current medication, Metoprolol, and monitor your blood pressure.  -HEART MURMUR: This is a sound during your heartbeat cycle made by turbulent blood in or near your heart. We will continue to monitor this.  -ANEMIA: This is a condition where you lack enough healthy red blood cells to carry adequate oxygen to your body's tissues. We will check your complete blood count and iron levels.  -COLON HEALTH: We will continue with your planned colonoscopy in August 2024 due to previous inflammation.  INSTRUCTIONS:  Plan for a flu shot and COVID booster in the fall. Your next follow-up appointment should be scheduled for November 2024.

## 2023-03-02 NOTE — Assessment & Plan Note (Signed)
On Fosamax, last bone density scan in February 2024. -Continue Fosamax.

## 2023-03-02 NOTE — Assessment & Plan Note (Addendum)
BP Readings from Last 3 Encounters:  03/02/23 120/75  03/01/23 138/80  02/15/23 (!) 150/78   Repeat BP WNL, continue metoprolol.

## 2023-03-02 NOTE — Assessment & Plan Note (Signed)
He is managed by pulmonology and reports overall stable.

## 2023-03-03 LAB — IRON,TIBC AND FERRITIN PANEL
%SAT: 35 % (calc) (ref 20–48)
Ferritin: 57 ng/mL (ref 24–380)
Iron: 118 ug/dL (ref 50–180)
TIBC: 339 mcg/dL (calc) (ref 250–425)

## 2023-03-04 ENCOUNTER — Other Ambulatory Visit (HOSPITAL_BASED_OUTPATIENT_CLINIC_OR_DEPARTMENT_OTHER): Payer: Self-pay

## 2023-03-07 ENCOUNTER — Other Ambulatory Visit (HOSPITAL_BASED_OUTPATIENT_CLINIC_OR_DEPARTMENT_OTHER): Payer: Self-pay

## 2023-03-08 ENCOUNTER — Other Ambulatory Visit (HOSPITAL_BASED_OUTPATIENT_CLINIC_OR_DEPARTMENT_OTHER): Payer: Self-pay

## 2023-03-08 DIAGNOSIS — Z961 Presence of intraocular lens: Secondary | ICD-10-CM | POA: Diagnosis not present

## 2023-03-08 DIAGNOSIS — H43813 Vitreous degeneration, bilateral: Secondary | ICD-10-CM | POA: Diagnosis not present

## 2023-03-08 DIAGNOSIS — E119 Type 2 diabetes mellitus without complications: Secondary | ICD-10-CM | POA: Diagnosis not present

## 2023-03-08 DIAGNOSIS — D3132 Benign neoplasm of left choroid: Secondary | ICD-10-CM | POA: Diagnosis not present

## 2023-03-08 DIAGNOSIS — H35372 Puckering of macula, left eye: Secondary | ICD-10-CM | POA: Diagnosis not present

## 2023-03-10 ENCOUNTER — Other Ambulatory Visit (HOSPITAL_BASED_OUTPATIENT_CLINIC_OR_DEPARTMENT_OTHER): Payer: Self-pay

## 2023-03-13 NOTE — Progress Notes (Unsigned)
Cardiology Office Note   Date:  03/17/2023   ID:  Ryan [name], DOB 1945/10/05, MRN [id number]  PCP:  Sandford Craze, NP  Cardiologist:   Dietrich Pates, MD   F/U of atrial flutter and HTN      History of Present Illness: Ryan [name] is a 77 y.o. male with a history of atrial flutter  Placed on Eliquis in 2017Hx of  Lexiscan in Aug 2017 was normal  Echo in Sept 2021 showed  LVEF and RVEF wre normal  MR was mild to moderate  I saw the pt in cinic in March 2023  Since seen he has done OK from cardiac standpoint  No palpitations  No dizziness   No CP    He follows in pulmonary clinic for IPF   One experimental drug caused severe hyperglycemia requiring hospitalization      Outpatient Medications Prior to Visit  Medication Sig Dispense Refill   alendronate (FOSAMAX) 70 MG tablet Take 1 tablet (70 mg total) by mouth every 7 (seven) days. Take with a full glass of water on an empty stomach. 12 tablet 4   apixaban (ELIQUIS) 5 MG TABS tablet Take 1 tablet (5 mg total) by mouth 2 (two) times daily. 60 tablet 5   atorvastatin (LIPITOR) 40 MG tablet TAKE 1 TABLET (40 MG TOTAL) BY MOUTH DAILY. 90 tablet 1   Calcium Carbonate-Vitamin D 600-400 MG-UNIT tablet Take 1 tablet by mouth 2 (two) times daily. Reported on 01/27/2016     cholecalciferol (VITAMIN D3) 25 MCG (1000 UNIT) tablet Take 1,000 Units by mouth daily.     Continuous Glucose Sensor (DEXCOM G7 SENSOR) MISC Apply 1 sensor every 10 days. 9 each 4   insulin aspart (NOVOLOG) 100 UNIT/ML injection Inject 2-3 Units into the skin 3 (three) times daily before meals. 10 mL PRN   insulin degludec (TRESIBA FLEXTOUCH) 100 UNIT/ML FlexTouch Pen Inject 6-8 Units into the skin daily. Will start in 2024 after he completes current supply of Levemir.     Insulin Syringe-Needle U-100 (TRUEPLUS INSULIN SYRINGE) 31G X 5/16" 0.5 ML MISC USE TO INJECT INSULIN 4 TIMES A DAY 300 each 3   metoprolol succinate (TOPROL-XL) 25 MG 24 hr tablet TAKE 1 TABLET (25 MG  TOTAL) BY MOUTH DAILY. (Patient taking differently: Take 25 mg by mouth daily.) 90 tablet 1   No facility-administered medications prior to visit.     Allergies:   Other and Lisinopril   Past Medical History:  Diagnosis Date   Allergy    Arthritis    Diabetes mellitus    GERD (gastroesophageal reflux disease)    History of chicken pox    Hyperlipidemia    Hypertension    IPF (idiopathic pulmonary fibrosis) (HCC)    Myocardial infarction (HCC) 2017   Osteoporosis    Type 2 diabetes mellitus with hyperglycemia, with long-term current use of insulin (HCC) 09/08/2015    Past Surgical History:  Procedure Laterality Date   EYE SURGERY  1998   bilateral   SHOULDER SURGERY  1990   right shoulder, torn rotator cuff.     Social History:  The patient  reports that he quit smoking about 57 years ago. His smoking use included cigarettes. He has never used smokeless tobacco. He reports that he does not drink alcohol and does not use drugs.   Family History:  The patient's family history includes Cancer in his mother; Diabetes in his paternal grandfather; Heart disease in his mother; Hyperlipidemia in  his father and mother; Hypertension in his father and mother; Stroke in his father.    ROS:  Please see the history of present illness. All other systems are reviewed and  Negative to the above problem except as noted.    PHYSICAL EXAM: VS:  BP (!) 154/84   Pulse 74   Ht 5' 9.5" (1.765 m)   Wt 165 lb 9.6 oz (75.1 kg)   SpO2 96%   BMI 24.10 kg/m   GEN: Well nourished, well developed, in no acute distress  HEENT: normal  Neck: JVP normal  No carotid bruits Cardiac: RRR; no murmurs, No LE  edema  Respiratory:  clear to auscultation No rales   GI: soft, nontender, nondistended   No hepatomegaly  MS: no deformity Moving all extremities   Skin: warm and dry, no rash Neuro:  Strength and sensation are intact Psych: euthymic mood, full affect   EKG:  EKG is not ordered today.    Echo:  04/21/20  1. Left ventricular ejection fraction, by estimation, is 60 to 65%. The left ventricle has normal function. The left ventricle has no regional wall motion abnormalities. Left ventricular diastolic parameters are consistent with Grade I diastolic dysfunction (impaired relaxation). 2. Right ventricular systolic function is normal. The right ventricular size is normal. 3. The mitral valve is normal in structure. Mild to moderate mitral valve regurgitation. No evidence of mitral stenosis. 4. The aortic valve is tricuspid. Aortic valve regurgitation is not visualized. No aortic stenosis is present. 5. The inferior vena cava is normal in size with greater than 50% respiratory variability, suggesting right atrial pressure of 3 mmHg. Lipid Panel    Component Value Date/Time   CHOL 131 03/02/2023 0952   TRIG 159.0 (H) 03/02/2023 0952   HDL 44.70 03/02/2023 0952   CHOLHDL 3 03/02/2023 0952   VLDL 31.8 03/02/2023 0952   LDLCALC 55 03/02/2023 0952      Wt Readings from Last 3 Encounters:  03/17/23 165 lb 9.6 oz (75.1 kg)  03/02/23 166 lb 3.2 oz (75.4 kg)  03/01/23 166 lb 9.6 oz (75.6 kg)      ASSESSMENT AND PLAN: 1  Paroxysmal atrial flutter  No symptomatic recurrence  Continue Eliquis    2  HTN  Blood pressure was elevated on arrival  Better on recheck though still a little high   Pt says his BP at home is in 110s     Bring cuff to next MD appt for comparison    3  HL  Last lipids LDL  73   HDL 47     4  Mitral regurg  mild on echo in 2023  No murmur on exam   Follow clinically   5    Pulm  Followed by Lavinia Sharps for IPF  6  DM   A1C was 7.1     Follow up in 1 year  Current medicines are reviewed at length with the patient today.  The patient does not have concerns regarding medicines.  Signed, Dietrich Pates, MD  03/17/2023 8:08 AM    [facility] [address] [place], [place], [place]  [address] Phone: [phone]; Fax: [phone]

## 2023-03-17 ENCOUNTER — Encounter: Payer: Self-pay | Admitting: Internal Medicine

## 2023-03-17 ENCOUNTER — Ambulatory Visit: Payer: PPO | Attending: Internal Medicine | Admitting: Internal Medicine

## 2023-03-17 VITALS — BP 138/76 | HR 74 | Ht 69.5 in | Wt 165.6 lb

## 2023-03-17 DIAGNOSIS — I4892 Unspecified atrial flutter: Secondary | ICD-10-CM | POA: Diagnosis not present

## 2023-03-17 NOTE — Patient Instructions (Signed)
Medication Instructions:  No changes *If you need a refill on your cardiac medications before your next appointment, please call your pharmacy*   Lab Work: none If you have labs (blood work) drawn today and your tests are completely normal, you will receive your results only by: [place] (if you have MyChart) OR A paper copy in the mail If you have any lab test that is abnormal or we need to change your treatment, we will call you to review the results.   Testing/Procedures: none   Follow-Up: At [facility], you and your health needs are our priority.  As part of our continuing mission to provide you with exceptional heart care, we have created designated Provider Care Teams.  These Care Teams include your primary Cardiologist (physician) and Advanced Practice Providers (APPs -  Physician Assistants and Nurse Practitioners) who all work together to provide you with the care you need, when you need it.   Your next appointment:   12 month(s)  Provider:   Dorris Carnes, MD

## 2023-03-18 ENCOUNTER — Other Ambulatory Visit (HOSPITAL_BASED_OUTPATIENT_CLINIC_OR_DEPARTMENT_OTHER): Payer: Self-pay

## 2023-03-18 ENCOUNTER — Other Ambulatory Visit: Payer: Self-pay | Admitting: Family

## 2023-03-20 ENCOUNTER — Other Ambulatory Visit (HOSPITAL_BASED_OUTPATIENT_CLINIC_OR_DEPARTMENT_OTHER): Payer: Self-pay

## 2023-03-20 MED ORDER — ATORVASTATIN CALCIUM 40 MG PO TABS
40.0000 mg | ORAL_TABLET | Freq: Every day | ORAL | 1 refills | Status: DC
  Filled 2023-03-20: qty 90, 90d supply, fill #0
  Filled 2023-06-30: qty 90, 90d supply, fill #1

## 2023-03-20 MED ORDER — METOPROLOL SUCCINATE ER 25 MG PO TB24
ORAL_TABLET | Freq: Every day | ORAL | 1 refills | Status: DC
  Filled 2023-03-20: qty 90, 90d supply, fill #0
  Filled 2023-06-30: qty 90, 90d supply, fill #1

## 2023-03-21 ENCOUNTER — Other Ambulatory Visit (HOSPITAL_BASED_OUTPATIENT_CLINIC_OR_DEPARTMENT_OTHER): Payer: Self-pay

## 2023-03-21 ENCOUNTER — Telehealth: Payer: Self-pay

## 2023-03-21 ENCOUNTER — Other Ambulatory Visit (HOSPITAL_COMMUNITY): Payer: Self-pay

## 2023-03-21 ENCOUNTER — Ambulatory Visit: Payer: Self-pay

## 2023-03-21 NOTE — Patient Instructions (Signed)
Visit Information  Thank you for taking time to visit with me today. Please don't hesitate to contact me if I can be of assistance to you.   Following are the goals we discussed today:  Continue to attend provider visits as scheduled Continue to take medications as prescribed Contact provider with health questions or concerns as needed Continue to Check Blood Sugar as recommended and notify Provider if outside recommended range or with questions or concerns. Continue to monitor blood pressure and notify provider if outside recommended range or with questions or concerns   If you are experiencing a Mental Health or Behavioral Health Crisis or need someone to talk to, please call the Suicide and Crisis Lifeline: 988 call the [place] National Suicide Prevention Lifeline: 8736917363 or TTY: (708)571-9921 TTY (918) 573-8867) to talk to a trained counselor call 1-800-273-TALK (toll free, 24 hour hotline)  Kathyrn Sheriff, RN, MSN, BSN, CCM [facility] Care Coordinator [phone]

## 2023-03-21 NOTE — Telephone Encounter (Signed)
Pharmacy Patient Advocate Encounter   Received notification from CoverMyMeds that prior authorization for Dexcom G7 supplies is required/requested.   Insurance verification completed.   The patient is insured through [employer] .   Per test claim: PA required; PA submitted to [employer] via Fax Key/confirmation #/EOC [id number] Status is pending

## 2023-03-21 NOTE — Patient Outreach (Signed)
  Care Coordination   Follow Up Visit Note   03/21/2023 Name: SAVAN CARREAU MRN: [id number] DOB: 03/09/1946  [name] is a 77 y.o. year old male who sees Sandford Craze, NP for primary care. I spoke with  [name] by phone today.  What matters to the patients health and wellness today?  Mr. Borner reports "I'm doing fine". He denies any low blood sugars. Reports had eye exam 03/17/23 with Dr. Dione Booze. Blood sugar today 119 after meal and Blood pressure 120/77.  He denies any questions or concerns, reports there is someone with him at his home all the time. Denies any care coordination, disease management or resource needs. Patient to contact RNCM if care coordination needs in the future.   Goals Addressed             This Visit's Progress    COMPLETED: continue to improve post hospitalization       Interventions Today    Flowsheet Row Most Recent Value  Chronic Disease   Chronic disease during today's visit Diabetes, Other, Hypertension (HTN)  [ILD]  General Interventions   General Interventions Discussed/Reviewed General Interventions Reviewed, Doctor Visits  [advised to contact RNCM if care coordination needs in the future]  Doctor Visits Discussed/Reviewed Doctor Visits Discussed  Education Interventions   Education Provided Provided Education  [reviewed patient instructions per provider visits: pulmonology, PCP, endocrinology and cardiology]  Provided Verbal Education On Other  [advised to continue to take medications as prescribed, monitor blood pressure, monitor blood sugars as recommended]  Pharmacy Interventions   Pharmacy Dicussed/Reviewed Pharmacy Topics Reviewed            SDOH assessments and interventions completed:  No  Care Coordination Interventions:  Yes, provided   Follow up plan: No further intervention required.   Encounter Outcome:  Pt. Visit Completed   Kathyrn Sheriff, RN, MSN, BSN, CCM [facility] Care Coordinator [phone]

## 2023-03-22 ENCOUNTER — Other Ambulatory Visit (HOSPITAL_BASED_OUTPATIENT_CLINIC_OR_DEPARTMENT_OTHER): Payer: Self-pay

## 2023-03-24 NOTE — Progress Notes (Signed)
03/25/2023 Ryan [name] [id number] 07-13-1946  Referring provider: Sandford Craze, NP Primary GI doctor: Dr. Marina Goodell  ASSESSMENT AND PLAN:   Colitis  Unremarkable colonoscopy 2001 Negative Cologuard 2020 Patient had acute nausea, vomiting abdominal pain and diarrhea with CT showing colitis ascending colon and some terminal ileum after being on investigational study drug for 7 to 8 days. This was stopped and all of his symptoms resolved. Patient was referred for possible colonoscopy. He has no family history no history of polyps. Prior to the colitis and now after stopping the medication his bowels have returned to normal, no diarrhea, constipation, abdominal pain, weight has returned, no iron deficiency anemia, no blood in stool. Discussed repeating colonoscopy but patient states he does not that want that at this time with his comorid conditions and age with his symptoms resolved.  Discussed could miss malignancy,IBD, etc but patient states he would not want it treated, understands, and will call if he changes his mind or any new symptoms.  Atrial flutter, unspecified type (HCC) On eliquis, rate controlled  Chronic diastolic CHF (congestive heart failure) (HCC) No valve issues  Interstitial lung disease (HCC) Not on oxygen, could be at [facility] if wants procedure  Diabetic ketoacidosis without coma associated with type 2 diabetes mellitus (HCC) Over a week ago, possibly due to study drug versus poor po intake, not taking insulin.  Abnormal CT of liver Recent repeat liver CT shows no cirrhosis, shows granuloma disease Normal platelets, normal LFTS   Patient Care Team: Sandford Craze, NP as PCP - General (Internal Medicine) Pricilla Riffle, MD as PCP - Cardiology (Cardiology) Carlus Pavlov, MD as Consulting Physician (Endocrinology) Pricilla Riffle, MD as Consulting Physician (Cardiology) Henrene Pastor, RPH-CPP (Pharmacist) Kalman Shan, MD as Consulting  Physician (Pulmonary Disease) Kalman Shan, MD as Consulting Physician (Pulmonary Disease)  HISTORY OF PRESENT ILLNESS: 77 y.o. male with a past medical history of arthritis, hypertension, hyperlipidemia, type 2 diabetes, interstitial lung disease, coronary disease with MI 2017, HFpEF with mild MR on echo 2023, atrial flutter on Eliquis, GERD, cirrhosis seen on CT 11/2021 and others listed below presents for evaluation of colitis.   04/13/2000 colonoscopy diverticulosis no colon polyps 09/2018 Cologuard negative 12/07/2021 office visit with Alcide Evener for abnormal CT showing shrunken nodular contour concerning for cirrhosis on 11/13/2021.   Had normal LFTs normal platelets.  No alcohol use Labs negative for hepatocellular disease including ANA, alpha 1, ASMA, ferritin, AMA, hepatitis C, B, IgG.  Patient is slightly elevated INR on Eliquis.  Patient did have hepatitis A immunity but did not have hepatitis B immunity. 12/17/2021 complete abdominal ultrasound showed heterogeneous liver echotexture mild nodular contour, granulomas of spleen but no splenomegaly.  5/2 through 5/4 admitted to the hospital with DKA after missing some nsulin doses, given broad-spectrum antibiotics for colitis and DKA treated with IV insulin.  Discharged on Augmentin for 7 days suggested outpatient colonoscopy. 12/09/2022 CT abdomen pelvis for abdominal pain, nausea vomiting diarrhea showed circumferential wall thickening decompressed cecum ascending colon hepatic flexure some wall thickening involving terminal ileum suggestive of segmental colitis potentially ileitis.  No complications.  Stable appearance of chronic pulmonary fibrosis, granulomatosis disease of liver and spleen.   Atherosclerosis abdominal aorta and iliac arteries no masses in the liver gallbladder normal pancreas normal no splenomegaly. Negative GI pathogen panel Colitis thought potentially AE from research medication and this was discontinued.  CBC  at the time showed leukocytosis 12.7 no anemia.   Repeat 5/4 showed Hgb  12.5. 03/02/2023 iron normal at 118, percent saturation 35, ferritin 57, he has no iron def.   Patient has idiopathic pulmonary fibrosis and follows with Dr. Marchelle Gearing.   Patient reported receiving study check for pulmonary fibrosis, this was discontinued secondary to colitis, abdominal pain and diarrhea assumed to be study drug related. Last PFTs showed DLCO 12.5 PVC predicted 2.9 He is not on oxygen, no SOB, no chest pain.  He has sugar monitor and states his sugars have been better since off the medication.  Patient follows with Dr. Tenny Craw for paroxysmal atrial fibrillation on Eliquis and metoprolol, Denies any chest pain.   No family history of colon cancer.  Prior to the ER visit he has normal Bm's, formed, no blood on stool.  He was on the study medication for  7-8 days, he had severe hyperglycemia with DKA and AB pain with diarrhea 3-4 x a day. He would have nocturnal symptoms. He also had nausea, vomiting and could not eat. He had weight loss when he was sick about 10 lbs He denies fever, chills.  Since he stopped the medication his weight has returned, he has not had diarrhea, blood in stool, AB pain, nausea, vomiting.   He denies NSAID use.  He denies ETOH use.   He denies tobacco use.  He denies drug use.     He  reports that he quit smoking about 57 years ago. His smoking use included cigarettes. He has never used smokeless tobacco. He reports that he does not drink alcohol and does not use drugs.  RELEVANT LABS AND IMAGING: CBC    Component Value Date/Time   WBC 7.9 03/02/2023 0952   RBC 4.58 03/02/2023 0952   HGB 13.7 03/02/2023 0952   HGB 14.6 09/08/2020 1656   HCT 42.6 03/02/2023 0952   HCT 43.4 09/08/2020 1656   PLT 274.0 03/02/2023 0952   PLT 255 09/08/2020 1656   MCV 93.1 03/02/2023 0952   MCV 90 09/08/2020 1656   MCH 30.3 12/11/2022 0236   MCHC 32.1 03/02/2023 0952   RDW 15.3 03/02/2023  0952   RDW 12.3 09/08/2020 1656   LYMPHSABS 2.0 03/02/2023 0952   LYMPHSABS 1.8 10/06/2017 1203   MONOABS 1.3 (H) 03/02/2023 0952   EOSABS 0.4 03/02/2023 0952   EOSABS 0.2 10/06/2017 1203   BASOSABS 0.1 03/02/2023 0952   BASOSABS 0.0 10/06/2017 1203   Recent Labs    12/07/22 1604 12/07/22 1807 12/09/22 0814 12/10/22 0306 12/11/22 0236 12/14/22 0943 03/02/23 0952  HGB 14.8 15.3 14.2 12.2* 12.5* 15.1 13.7    CMP     Component Value Date/Time   NA 137 12/14/2022 0943   NA 141 04/04/2017 1443   K 3.6 12/14/2022 0943   CL 98 12/14/2022 0943   CO2 27 12/14/2022 0943   GLUCOSE 149 (H) 03/01/2023 1131   BUN 8 12/14/2022 0943   BUN 22 04/04/2017 1443   CREATININE 0.90 12/14/2022 0943   CREATININE 1.08 04/25/2020 1143   CALCIUM 9.5 12/14/2022 0943   PROT 7.7 12/14/2022 0943   ALBUMIN 3.7 12/14/2022 0943   AST 22 12/14/2022 0943   ALT 20 12/14/2022 0943   ALKPHOS 82 12/14/2022 0943   BILITOT 0.6 12/14/2022 0943   GFRNONAA >60 12/11/2022 0236   GFRNONAA 85 01/15/2014 0843   GFRAA 79 04/04/2017 1443   GFRAA >89 01/15/2014 0843      Latest Ref Rng & Units 12/14/2022    9:43 AM 12/11/2022    2:36 AM 12/10/2022  3:06 AM  Hepatic Function  Total Protein 6.0 - 8.3 g/dL 7.7  5.8  5.9   Albumin 3.5 - 5.2 g/dL 3.7  2.6  2.6   AST 0 - 37 U/L 22  16  16    ALT 0 - 53 U/L 20  16  16    Alk Phosphatase 39 - 117 U/L 82  56  54   Total Bilirubin 0.2 - 1.2 mg/dL 0.6  1.3  1.5   Bilirubin, Direct 0.0 - 0.3 mg/dL 0.2         Latest Ref Rng & Units 12/07/2021    2:22 PM  Hepatitis C  AFP <6.1 ng/mL 2.6     Current Medications:   Current Outpatient Medications (Endocrine & Metabolic):    alendronate (FOSAMAX) 70 MG tablet, Take 1 tablet (70 mg total) by mouth every 7 (seven) days. Take with a full glass of water on an empty stomach.   insulin aspart (NOVOLOG) 100 UNIT/ML injection, Inject 2-3 Units into the skin 3 (three) times daily before meals.   insulin degludec (TRESIBA  FLEXTOUCH) 100 UNIT/ML FlexTouch Pen, Inject 6-8 Units into the skin daily. Will start in 2024 after he completes current supply of Levemir.  Current Outpatient Medications (Cardiovascular):    atorvastatin (LIPITOR) 40 MG tablet, TAKE 1 TABLET (40 MG TOTAL) BY MOUTH DAILY.   metoprolol succinate (TOPROL-XL) 25 MG 24 hr tablet, TAKE 1 TABLET (25 MG TOTAL) BY MOUTH DAILY.    Current Outpatient Medications (Hematological):    apixaban (ELIQUIS) 5 MG TABS tablet, Take 1 tablet (5 mg total) by mouth 2 (two) times daily.  Current Outpatient Medications (Other):    Calcium Carbonate-Vitamin D 600-400 MG-UNIT tablet, Take 1 tablet by mouth 2 (two) times daily. Reported on 01/27/2016   cholecalciferol (VITAMIN D3) 25 MCG (1000 UNIT) tablet, Take 1,000 Units by mouth daily.   Continuous Glucose Sensor (DEXCOM G7 SENSOR) MISC, Apply 1 sensor every 10 days.   Insulin Syringe-Needle U-100 (TRUEPLUS INSULIN SYRINGE) 31G X 5/16" 0.5 ML MISC, USE TO INJECT INSULIN 4 TIMES A DAY  Medical History:  Past Medical History:  Diagnosis Date   Allergy    Arthritis    Diabetes mellitus    GERD (gastroesophageal reflux disease)    History of chicken pox    Hyperlipidemia    Hypertension    IPF (idiopathic pulmonary fibrosis) (HCC)    Myocardial infarction (HCC) 2017   Osteoporosis    Type 2 diabetes mellitus with hyperglycemia, with long-term current use of insulin (HCC) 09/08/2015   Allergies:  Allergies  Allergen Reactions   Other Other (See Comments)    Client states he became very sick and had significant skin inflammation and irritation with the Freestyle Libre sensor   Lisinopril     Cough      Surgical History:  He  has a past surgical history that includes Shoulder surgery (1990) and Eye surgery (1998). Family History:  His family history includes Cancer in his mother; Diabetes in his paternal grandfather; Heart disease in his mother; Hyperlipidemia in his father and mother; Hypertension  in his father and mother; Stroke in his father.  REVIEW OF SYSTEMS  : All other systems reviewed and negative except where noted in the History of Present Illness.  PHYSICAL EXAM: BP 122/78   Pulse 68   Ht 5\' 9"  (1.753 m)   Wt 164 lb (74.4 kg)   BMI 24.22 kg/m  General Appearance: Well nourished, in no apparent distress. Head:  Normocephalic and atraumatic. Eyes:  sclerae anicteric,conjunctive pink  Respiratory: Respiratory effort normal, BS equal bilaterally without rales, rhonchi, wheezing. Cardio: RRR with no MRGs. Peripheral pulses intact.  Abdomen: Soft,  Non-distended ,active bowel sounds. No tenderness . Without guarding and Without rebound. No masses. Rectal: Not evaluated Musculoskeletal: Full ROM, Normal gait. Without edema. Skin:  Dry and intact without significant lesions or rashes Neuro: Alert and  oriented x4;  No focal deficits. Psych:  Cooperative. Normal mood and affect.    Doree Albee, PA-C 12:23 PM

## 2023-03-25 ENCOUNTER — Encounter: Payer: Self-pay | Admitting: Physician Assistant

## 2023-03-25 ENCOUNTER — Ambulatory Visit: Payer: PPO | Admitting: Physician Assistant

## 2023-03-25 ENCOUNTER — Other Ambulatory Visit: Payer: Self-pay

## 2023-03-25 ENCOUNTER — Other Ambulatory Visit (HOSPITAL_BASED_OUTPATIENT_CLINIC_OR_DEPARTMENT_OTHER): Payer: Self-pay

## 2023-03-25 VITALS — BP 122/78 | HR 68 | Ht 69.0 in | Wt 164.0 lb

## 2023-03-25 DIAGNOSIS — E1110 Type 2 diabetes mellitus with ketoacidosis without coma: Secondary | ICD-10-CM

## 2023-03-25 DIAGNOSIS — Z794 Long term (current) use of insulin: Secondary | ICD-10-CM | POA: Diagnosis not present

## 2023-03-25 DIAGNOSIS — R932 Abnormal findings on diagnostic imaging of liver and biliary tract: Secondary | ICD-10-CM

## 2023-03-25 DIAGNOSIS — I4892 Unspecified atrial flutter: Secondary | ICD-10-CM

## 2023-03-25 DIAGNOSIS — I5032 Chronic diastolic (congestive) heart failure: Secondary | ICD-10-CM | POA: Diagnosis not present

## 2023-03-25 DIAGNOSIS — K529 Noninfective gastroenteritis and colitis, unspecified: Secondary | ICD-10-CM

## 2023-03-25 DIAGNOSIS — J849 Interstitial pulmonary disease, unspecified: Secondary | ICD-10-CM

## 2023-03-25 NOTE — Patient Instructions (Addendum)
If you have any symptoms or issues please call our office and we will set up a colonoscopy.  _______________________________________________________  If your blood pressure at your visit was 140/90 or greater, please contact your primary care physician to follow up on this.  _______________________________________________________  If you are age 77 or older, your body mass index should be between 23-30. Your Body mass index is 24.22 kg/m. If this is out of the aforementioned range listed, please consider follow up with your Primary Care Provider.  If you are age 56 or younger, your body mass index should be between 19-25. Your Body mass index is 24.22 kg/m. If this is out of the aformentioned range listed, please consider follow up with your Primary Care Provider.   ________________________________________________________  The [place] [place] [facility] providers would like to encourage you to use [facility] to communicate with providers for non-urgent requests or questions.  Due to long hold times on the telephone, sending your provider a message by [facility] may be a faster and more efficient way to get a response.  Please allow 48 business hours for a response.  Please remember that this is for non-urgent requests.  _______________________________________________________ It was a pleasure to see you today!  Thank you for trusting me with your gastrointestinal care!

## 2023-03-25 NOTE — Progress Notes (Signed)
Noted  

## 2023-03-30 NOTE — Telephone Encounter (Signed)
Pharmacy Patient Advocate Encounter  Received notification from [employer]/ [facility] that Prior Authorization for Dexcom G7 supplies has been:   Please bill to Part B

## 2023-04-06 NOTE — Addendum Note (Signed)
Addended by: Pollie Meyer on: 04/06/2023 10:23 AM   Modules accepted: Orders

## 2023-04-06 NOTE — Telephone Encounter (Signed)
Order has been submitted through [facility] submitted to [employer] [facility]

## 2023-05-02 ENCOUNTER — Other Ambulatory Visit (HOSPITAL_BASED_OUTPATIENT_CLINIC_OR_DEPARTMENT_OTHER): Payer: Self-pay

## 2023-05-13 ENCOUNTER — Telehealth: Payer: Self-pay

## 2023-05-13 ENCOUNTER — Telehealth: Payer: Self-pay | Admitting: *Deleted

## 2023-05-13 NOTE — Telephone Encounter (Signed)
Samples received for novolog and Fiasp flextouch. In the back refrigerator. Patient notified, will have son pick up

## 2023-05-13 NOTE — Telephone Encounter (Signed)
Receive Novofine 32G tip needle, 4 boxes x 100 each from patient assistance. Per Jac Canavan at [employer], pt will need to re-enroll in the program 30 days before 10/09/23.  We will need to use their new patient assistance form at [email or web address] (they will not accept previous form).

## 2023-06-06 ENCOUNTER — Other Ambulatory Visit: Payer: Self-pay

## 2023-06-06 ENCOUNTER — Other Ambulatory Visit: Payer: Self-pay | Admitting: Internal Medicine

## 2023-06-06 ENCOUNTER — Other Ambulatory Visit (HOSPITAL_BASED_OUTPATIENT_CLINIC_OR_DEPARTMENT_OTHER): Payer: Self-pay

## 2023-06-06 DIAGNOSIS — I4892 Unspecified atrial flutter: Secondary | ICD-10-CM

## 2023-06-06 MED ORDER — APIXABAN 5 MG PO TABS
5.0000 mg | ORAL_TABLET | Freq: Two times a day (BID) | ORAL | 5 refills | Status: DC
Start: 2023-06-06 — End: 2023-12-26
  Filled 2023-06-06: qty 60, 30d supply, fill #0
  Filled 2023-07-13: qty 60, 30d supply, fill #1
  Filled 2023-08-15: qty 60, 30d supply, fill #2
  Filled 2023-09-23: qty 60, 30d supply, fill #3
  Filled 2023-10-26: qty 60, 30d supply, fill #4
  Filled 2023-11-28: qty 60, 30d supply, fill #5

## 2023-06-06 NOTE — Telephone Encounter (Signed)
Eliquis 5mg  refill request received. Patient is 77 years old, weight-74.4kg, Crea-0.90 on 12/14/22, Diagnosis-AFLUTTER, and last seen by Dr. Tenny Craw on 03/17/23. Dose is appropriate based on dosing criteria. Will send in refill to requested pharmacy.

## 2023-06-07 ENCOUNTER — Other Ambulatory Visit: Payer: PPO | Admitting: Pharmacist

## 2023-06-07 NOTE — Progress Notes (Signed)
06/07/2023 Name: Ryan [name] MRN: [id number] DOB: [birth year]  Chief Complaint  Patient presents with   Medication Management    Ryan [name] is a 77 y.o. year old male who presented for a telephone visit.   They were referred to the pharmacist by their PCP for assistance in managing medication access.    Subjective:  Medication Access/Adherence  Patient has been receiving Novolog and Tresiba from [employer] patient assistance program . Received call from [employer] program to remind [place] that patient's enrollment would end in March 2025 and he should complete re-enrollment application however since he has Medicare his enrollment actually is only valid thru 08/09/2023.  During this call I also noted that he received a delivery of Fiasp recently. He had received this from [facility] in the past and it was an error. I had contacted [facility] then to make sure this was no longer on his profile so not sure how it was filled again. Patient endorses that he has not taken the [place].   Diabetes (A1c goal <7%) Recent home BG reading ranged from 75 to 150; Using DexCom 7 sensor Reports he usually uses juice to treat hypoglycemia episodes but has glucagon on hand Current medications: Novolog 2 to 6 units three times a day w/ meals Levemir 6 to 8 units at bedtime  Medications previously tried: metformin (GI intolerance)       Objective:  Lab Results  Component Value Date   HGBA1C 7.1 (A) 03/01/2023    Lab Results  Component Value Date   CREATININE 0.90 12/14/2022   BUN 8 12/14/2022   NA 137 12/14/2022   K 3.6 12/14/2022   CL 98 12/14/2022   CO2 27 12/14/2022    Lab Results  Component Value Date   CHOL 131 03/02/2023   HDL 44.70 03/02/2023   LDLCALC 55 03/02/2023   TRIG 159.0 (H) 03/02/2023   CHOLHDL 3 03/02/2023    Medications Reviewed Today     Reviewed by Henrene Pastor, RPH-CPP (Pharmacist) on 06/07/23 at 1521  Med List Status: <None>   Medication Order Taking? Sig Documenting  Provider Last Dose Status Informant  alendronate (FOSAMAX) 70 MG tablet [id number]  Take 1 tablet (70 mg total) by mouth every 7 (seven) days. Take with a full glass of water on an empty stomach. Sandford Craze, NP  Active Self, Pharmacy Records  apixaban (ELIQUIS) 5 MG TABS tablet [id number]  Take 1 tablet (5 mg total) by mouth 2 (two) times daily. Pricilla Riffle, MD  Active   atorvastatin (LIPITOR) 40 MG tablet [id number]  TAKE 1 TABLET (40 MG TOTAL) BY MOUTH DAILY. Sandford Craze, NP  Active   Calcium Carbonate-Vitamin D 600-400 MG-UNIT tablet [id number]  Take 1 tablet by mouth 2 (two) times daily. Reported on 01/27/2016 [provider]  Active Self, Pharmacy Records  cholecalciferol (VITAMIN D3) 25 MCG (1000 UNIT) tablet [id number]  Take 1,000 Units by mouth daily. [provider]  Active Self, Pharmacy Records  Continuous Glucose Sensor (DEXCOM G7 SENSOR) MISC [id number]  Apply 1 sensor every 10 days. Carlus Pavlov, MD  Active   insulin aspart (NOVOLOG) 100 UNIT/ML injection [id number]  Inject 2-3 Units into the skin 3 (three) times daily before meals. Carlus Pavlov, MD  Active Self, Pharmacy Records  insulin degludec [facility]) 100 UNIT/ML FlexTouch Pen [id number]  Inject 6-8 Units into the skin daily. Will start in 2024 after he completes current supply of Levemir. Carlus Pavlov, MD  Active   Insulin Syringe-Needle  U-100 (TRUEPLUS INSULIN SYRINGE) 31G X 5/16" 0.5 ML MISC [id number]  USE TO INJECT INSULIN 4 TIMES A Sammuel Bailiff, MD  Active Self, Pharmacy Records  metoprolol succinate (TOPROL-XL) 25 MG 24 hr tablet [id number]  TAKE 1 TABLET (25 MG TOTAL) BY MOUTH DAILY. Sandford Craze, NP  Active               Assessment/Plan:  Diabetes:  -Again called [facility] to cancel Rx for [employer]. They will send packing labels to send Fiasp back. Per representative that I spoke with there are no refills for Fiasp left and patient should not be able  to get again.  - Continue to follow up with Dr Elvera Lennox  - Continue current insulin regimen.  -Forwarding request to Med Assist Team to mail patient 2025 applications for [employer] or contact him to start online process.  - Reviewed recognition and treatment of hypoglycemia.   Health Maintenance - Discussed CDC/ACIP recommendations for influenza, COVID and RSV vaccine(s) discussed. Patient to get flu and COVID vaccine this week and then will et RSV, 2 weeks later.   Medication Management: Will continue to review adherence and follow up with patient as needed Continue to take medications as prescribed.     Follow Up Plan: 2 months to check on 2025 PAP  Henrene Pastor, PharmD Clinical Pharmacist [facility]  [facility] [phone]

## 2023-06-27 ENCOUNTER — Ambulatory Visit: Payer: PPO | Admitting: Family

## 2023-06-30 ENCOUNTER — Telehealth: Payer: Self-pay

## 2023-06-30 ENCOUNTER — Other Ambulatory Visit (HOSPITAL_BASED_OUTPATIENT_CLINIC_OR_DEPARTMENT_OTHER): Payer: Self-pay

## 2023-06-30 NOTE — Progress Notes (Signed)
Pharmacy Medication Assistance Program Note    06/30/2023  Patient ID: Ryan [name], male   DOB: 1946-06-01, 77 y.o.   MRN: [id number]     06/30/2023  Outreach Medication One  Initial Outreach Date (Medication One) 06/30/2023  Manufacturer Medication One [employer] [facility] Tresiba  Dose of Novolog 100 UNITS  Dose of Tresiba 100 UNITS  Type of Assistance Manufacturer Assistance  Date Application Sent to Patient 06/30/2023  Name of Prescriber Carlus Pavlov, MD       Signature

## 2023-06-30 NOTE — Telephone Encounter (Signed)
PAP: Application for Ryan [name] has been submitted to PAP Companies: [facility]oNord[facility], Animal nutritionist BE ADVISED I WILL BE FAXING PROVIDER PAGES OVER TO OFFICE OF  Carlus Pavlov, MD

## 2023-06-30 NOTE — Telephone Encounter (Signed)
-----   Message from Henrene Pastor sent at 06/07/2023  4:53 PM EDT ----- Please start process for [employer] application for patient assistance program - patient is taking Evaristo Bury, Novolog and gets pen needles.

## 2023-07-01 ENCOUNTER — Ambulatory Visit (INDEPENDENT_AMBULATORY_CARE_PROVIDER_SITE_OTHER): Payer: PPO | Admitting: Family

## 2023-07-01 VITALS — BP 137/76 | HR 65 | Temp 97.3°F | Resp 16 | Ht 69.0 in | Wt 167.0 lb

## 2023-07-01 DIAGNOSIS — J84112 Idiopathic pulmonary fibrosis: Secondary | ICD-10-CM | POA: Diagnosis not present

## 2023-07-01 DIAGNOSIS — M810 Age-related osteoporosis without current pathological fracture: Secondary | ICD-10-CM | POA: Diagnosis not present

## 2023-07-01 DIAGNOSIS — Z794 Long term (current) use of insulin: Secondary | ICD-10-CM | POA: Diagnosis not present

## 2023-07-01 DIAGNOSIS — I5032 Chronic diastolic (congestive) heart failure: Secondary | ICD-10-CM

## 2023-07-01 DIAGNOSIS — K219 Gastro-esophageal reflux disease without esophagitis: Secondary | ICD-10-CM | POA: Diagnosis not present

## 2023-07-01 DIAGNOSIS — E7800 Pure hypercholesterolemia, unspecified: Secondary | ICD-10-CM

## 2023-07-01 DIAGNOSIS — I4892 Unspecified atrial flutter: Secondary | ICD-10-CM

## 2023-07-01 DIAGNOSIS — E118 Type 2 diabetes mellitus with unspecified complications: Secondary | ICD-10-CM | POA: Diagnosis not present

## 2023-07-01 LAB — COMPREHENSIVE METABOLIC PANEL
ALT: 13 U/L (ref 0–53)
AST: 17 U/L (ref 0–37)
Albumin: 4.3 g/dL (ref 3.5–5.2)
Alkaline Phosphatase: 68 U/L (ref 39–117)
BUN: 12 mg/dL (ref 6–23)
CO2: 30 meq/L (ref 19–32)
Calcium: 9.8 mg/dL (ref 8.4–10.5)
Chloride: 99 meq/L (ref 96–112)
Creatinine, Ser: 0.97 mg/dL (ref 0.40–1.50)
GFR: 75.5 mL/min (ref >60.00)
Glucose, Bld: 175 mg/dL — ABNORMAL HIGH (ref 70–99)
Potassium: 4.6 meq/L (ref 3.5–5.1)
Sodium: 137 meq/L (ref 135–145)
Total Bilirubin: 1.3 mg/dL — ABNORMAL HIGH (ref 0.2–1.2)
Total Protein: 7.6 g/dL (ref 6.0–8.3)

## 2023-07-01 NOTE — Assessment & Plan Note (Signed)
Clinically stable without furosemide.

## 2023-07-01 NOTE — Assessment & Plan Note (Signed)
Dexa up to date,  continue fosamax, caltrate.

## 2023-07-01 NOTE — Assessment & Plan Note (Signed)
Lab Results  Component Value Date   CHOL 131 03/02/2023   HDL 44.70 03/02/2023   LDLCALC 55 03/02/2023   TRIG 159.0 (H) 03/02/2023   CHOLHDL 3 03/02/2023   At goal on atorvastatin.

## 2023-07-01 NOTE — Assessment & Plan Note (Signed)
Followed by pulmonology

## 2023-07-01 NOTE — Assessment & Plan Note (Signed)
Stable without medication.

## 2023-07-01 NOTE — Assessment & Plan Note (Signed)
Rate stable on metoprolol. Continues eliquis.

## 2023-07-01 NOTE — Progress Notes (Signed)
Subjective:     Patient ID: Ryan [name], male    DOB: 08-27-1945, 76 y.o.   MRN: [id number]  Chief Complaint  Patient presents with   Diabetes    Here for follow up   Hypertension    Here for follow up    Discussed the use of AI scribe software for clinical note transcription with the patient, who gave verbal consent to proceed.  History of Present Illness   The patient, with a history of pulmonary fibrosis, diabetes, and osteoporosis, presents for a routine follow-up. He reports feeling "much better" and has recently received his flu and COVID-19 vaccinations. He inquires about the RSV shot due to his lung disease and decides to get it at a nearby pharmacy.  Regarding his diabetes, the patient reports morning hyperglycemia despite not eating overnight. He is puzzled by this pattern, but understands that the liver stores and releases sugar. His last A1c in July was 7.1, and he is due for another test soon. He reports a history of dangerous hypoglycemia, and his current glucose monitor alerts him when his levels drop to 70. He is currently on Tresiba and Novolog for insulin management, but reports receiving the wrong insulin from the pharmacy recently.  The patient's pulmonary fibrosis is being managed by a pulmonary doctor, but he is not currently participating in any programs or trials due to the impact on his diabetes. He reports no issues with reflux and is taking Fosamax for osteoporosis. He also takes a calcium supplement twice a day. He reports no issues with swelling and his shortness of breath is at baseline. He has a kitten at home and checks his feet daily for any changes due to his diabetes.  The patient is also under the care of a cardiologist and is on Eliquis, metoprolol, and atorvastatin for heart health. He reports all EKGs have come out fine. He also mentions a desire to purchase a device to check his heart rate at home.          Health Maintenance Due  Topic Date  Due   DTaP/Tdap/Td (3 - Tdap) 08/24/2021    Past Medical History:  Diagnosis Date   Allergy    Arthritis    Diabetes mellitus    GERD (gastroesophageal reflux disease)    History of chicken pox    Hyperlipidemia    Hypertension    IPF (idiopathic pulmonary fibrosis) (HCC)    Myocardial infarction (HCC) 2017   Osteoporosis    Type 2 diabetes mellitus with hyperglycemia, with long-term current use of insulin (HCC) 09/08/2015    Past Surgical History:  Procedure Laterality Date   EYE SURGERY  1998   bilateral   SHOULDER SURGERY  1990   right shoulder, torn rotator cuff.    Family History  Problem Relation Age of Onset   Cancer Mother        cancer   Heart disease Mother    Hyperlipidemia Mother    Hypertension Mother    Hyperlipidemia Father    Stroke Father    Hypertension Father    Diabetes Paternal Grandfather    Colon cancer Neg Hx    Stomach cancer Neg Hx    Esophageal cancer Neg Hx     Social History   Socioeconomic History   Marital status: Widowed    Spouse name: Not on file   Number of children: 1   Years of education: Not on file   Highest education level: Not on file  Occupational History   Occupation: retired    Comment: from Engineering geologist  Tobacco Use   Smoking status: Former    Current packs/day: 0.00    Types: Cigarettes    Quit date: 09/01/1965    Years since quitting: 57.8   Smokeless tobacco: Never   Tobacco comments:    Smoked about 1cig a week for about 6 months.  Vaping Use   Vaping status: Never Used  Substance and Sexual Activity   Alcohol use: No   Drug use: No   Sexual activity: Never  Other Topics Concern   Not on file  Social History Narrative   Regular exercise:  5 x weekly   Caffeine Use: 1 cups coffee daily.   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   Son, daughter in law and young adult granddaughter and grandson live with client   Wife died from ovarian cancer in 07-13-2015         Social Determinants of Health   Financial  Resource Strain: Low Risk  (02/15/2023)   Overall Financial Resource Strain (CARDIA)    Difficulty of Paying Living Expenses: Not hard at all  Food Insecurity: No Food Insecurity (12/13/2022)   Hunger Vital Sign    Worried About Running Out of Food in the Last Year: Never true    Ran Out of Food in the Last Year: Never true  Transportation Needs: No Transportation Needs (12/13/2022)   PRAPARE - Administrator, Civil Service (Medical): No    Lack of Transportation (Non-Medical): No  Physical Activity: Insufficiently Active (02/15/2023)   Exercise Vital Sign    Days of Exercise per Week: 3 days    Minutes of Exercise per Session: 30 min  Stress: No Stress Concern Present (02/15/2023)   [employer] of Occupational Health - Occupational Stress Questionnaire    Feeling of Stress : Not at all  Social Connections: Socially Isolated (02/15/2023)   Social Connection and Isolation Panel [NHANES]    Frequency of Communication with Friends and Family: Twice a week    Frequency of Social Gatherings with Friends and Family: Never    Attends Religious Services: Never    Database administrator or Organizations: No    Attends Banker Meetings: Never    Marital Status: Widowed  Intimate Partner Violence: Not At Risk (12/09/2022)   Humiliation, Afraid, Rape, and Kick questionnaire    Fear of Current or Ex-Partner: No    Emotionally Abused: No    Physically Abused: No    Sexually Abused: No    Outpatient Medications Prior to Visit  Medication Sig Dispense Refill   alendronate (FOSAMAX) 70 MG tablet Take 1 tablet (70 mg total) by mouth every 7 (seven) days. Take with a full glass of water on an empty stomach. 12 tablet 4   apixaban (ELIQUIS) 5 MG TABS tablet Take 1 tablet (5 mg total) by mouth 2 (two) times daily. 60 tablet 5   atorvastatin (LIPITOR) 40 MG tablet TAKE 1 TABLET (40 MG TOTAL) BY MOUTH DAILY. 90 tablet 1   Calcium Carbonate-Vitamin D 600-400 MG-UNIT tablet Take 1  tablet by mouth 2 (two) times daily. Reported on 01/27/2016     cholecalciferol (VITAMIN D3) 25 MCG (1000 UNIT) tablet Take 1,000 Units by mouth daily.     Continuous Glucose Sensor (DEXCOM G7 SENSOR) MISC Apply 1 sensor every 10 days. 9 each 4   insulin aspart (NOVOLOG) 100 UNIT/ML injection Inject 2-3 Units into the skin 3 (three) times daily  before meals. 10 mL PRN   insulin degludec (TRESIBA FLEXTOUCH) 100 UNIT/ML FlexTouch Pen Inject 6-8 Units into the skin daily. Will start in 2024 after he completes current supply of Levemir.     Insulin Syringe-Needle U-100 (TRUEPLUS INSULIN SYRINGE) 31G X 5/16" 0.5 ML MISC USE TO INJECT INSULIN 4 TIMES A DAY 300 each 3   metoprolol succinate (TOPROL-XL) 25 MG 24 hr tablet TAKE 1 TABLET (25 MG TOTAL) BY MOUTH DAILY. 90 tablet 1   No facility-administered medications prior to visit.    Allergies  Allergen Reactions   Other Other (See Comments)    Client states he became very sick and had significant skin inflammation and irritation with the Freestyle Libre sensor   Lisinopril     Cough     ROS    See HPI Objective:    Physical Exam Constitutional:      General: He is not in acute distress.    Appearance: He is well-developed.  HENT:     Head: Normocephalic and atraumatic.  Cardiovascular:     Rate and Rhythm: Normal rate and regular rhythm.     Heart sounds: No murmur heard. Pulmonary:     Effort: Pulmonary effort is normal. No respiratory distress.     Breath sounds: Normal breath sounds. No wheezing or rales.  Skin:    General: Skin is warm and dry.  Neurological:     Mental Status: He is alert and oriented to person, place, and time.  Psychiatric:        Behavior: Behavior normal.        Thought Content: Thought content normal.    Diabetic Foot Exam - Simple   Simple Foot Form Diabetic Foot exam was performed with the following findings: Yes 07/01/2023  9:42 AM  Visual Inspection No deformities, no ulcerations, no other skin  breakdown bilaterally: Yes Sensation Testing Intact to touch and monofilament testing bilaterally: Yes Pulse Check Posterior Tibialis and Dorsalis pulse intact bilaterally: Yes Comments       BP 137/76 (BP Location: Right Arm, Patient Position: Sitting, Cuff Size: Normal)   Pulse 65   Temp (!) 97.3 F (36.3 C) (Oral)   Resp 16   Ht 5\' 9"  (1.753 m)   Wt 167 lb (75.8 kg)   SpO2 99%   BMI 24.66 kg/m  Wt Readings from Last 3 Encounters:  07/01/23 167 lb (75.8 kg)  03/25/23 164 lb (74.4 kg)  03/17/23 165 lb 9.6 oz (75.1 kg)       Assessment & Plan:   Problem List Items Addressed This Visit       Unprioritized   Osteoporosis    Dexa up to date,  continue fosamax, caltrate.       IPF (idiopathic pulmonary fibrosis) (HCC)    Followed by pulmonology.       Hyperlipidemia    Lab Results  Component Value Date   CHOL 131 03/02/2023   HDL 44.70 03/02/2023   LDLCALC 55 03/02/2023   TRIG 159.0 (H) 03/02/2023   CHOLHDL 3 03/02/2023   At goal on atorvastatin.       GERD (gastroesophageal reflux disease)    Stable without medication.       Controlled diabetes mellitus type 2 with complications (HCC) - Primary    Lab Results  Component Value Date   HGBA1C 7.1 (A) 03/01/2023   HGBA1C 7.3 (A) 10/01/2022   HGBA1C 7.0 (A) 05/28/2022   Lab Results  Component Value Date   MICROALBUR  1.5 09/01/2022   LDLCALC 55 03/02/2023   CREATININE 0.90 12/14/2022   Overall stable on current insulin regimen.  Pt has follow up scheduled with Endo.        Relevant Orders   Comp Met (CMET)   Chronic diastolic CHF (congestive heart failure) (HCC)    Clinically stable without furosemide.       Atrial flutter (HCC)    Rate stable on metoprolol. Continues eliquis.        I am having Ryan [name] maintain his Calcium Carbonate-Vitamin D, cholecalciferol, Insulin Syringe-Needle U-100, insulin aspart, alendronate, Tresiba FlexTouch, Dexcom G7 Sensor, metoprolol succinate,  atorvastatin, and apixaban.  No orders of the defined types were placed in this encounter.

## 2023-07-01 NOTE — Patient Instructions (Signed)
VISIT SUMMARY:  During your routine follow-up visit, we discussed your overall health, including your pulmonary fibrosis, diabetes, and osteoporosis. You reported feeling much better and have recently received your flu and COVID-19 vaccinations. We also talked about your morning hyperglycemia and the possibility of the dawn phenomenon. You are managing your pulmonary fibrosis with your pulmonologist and are continuing your current treatments for osteoporosis and gastroesophageal reflux disease. We also discussed your interest in the RSV vaccine and your heart health.  YOUR PLAN:  -DIABETES MELLITUS: Diabetes Mellitus is a condition where your blood sugar levels are too high. Your morning high blood sugar levels might be due to the dawn phenomenon, which is when your liver releases sugar into your blood in the early morning. Continue with your current insulin regimen of Tresiba (6-8 units) and Novolog (2-3 units). Make sure to check your A1c with your endocrinologist during your upcoming visit.  -PULMONARY FIBROSIS: Pulmonary Fibrosis is a lung disease that causes scarring of the lungs. Your condition is stable with no new symptoms. Continue your current management and follow up with your pulmonologist as planned.  -OSTEOPOROSIS: Osteoporosis is a condition where your bones become weak and brittle. You are currently taking Fosamax and a calcium supplement, and you have no new symptoms or fractures. Continue with your current management.  -GASTROESOPHAGEAL REFLUX DISEASE: Gastroesophageal Reflux Disease (GERD) is a condition where stomach acid frequently flows back into the tube connecting your mouth and stomach. You have no current symptoms. Continue with your current management.  -VACCINATIONS: You inquired about the RSV vaccine due to your lung disease. It is advised that you get the RSV vaccine at the pharmacy.  -GENERAL HEALTH MAINTENANCE: We will check your kidney and liver function today. It is  also advised that you get a tetanus vaccine at the pharmacy. Follow up in six months for your next routine visit.  INSTRUCTIONS:  Please check your A1c with your endocrinologist during your upcoming visit. Get the RSV and tetanus vaccines at the pharmacy. Follow up in six months for your next routine visit.

## 2023-07-01 NOTE — Assessment & Plan Note (Addendum)
Lab Results  Component Value Date   HGBA1C 7.1 (A) 03/01/2023   HGBA1C 7.3 (A) 10/01/2022   HGBA1C 7.0 (A) 05/28/2022   Lab Results  Component Value Date   MICROALBUR 1.5 09/01/2022   LDLCALC 55 03/02/2023   CREATININE 0.90 12/14/2022   Overall stable on current insulin regimen.  Pt has follow up scheduled with Endo.

## 2023-07-04 ENCOUNTER — Ambulatory Visit (INDEPENDENT_AMBULATORY_CARE_PROVIDER_SITE_OTHER): Payer: PPO | Admitting: Internal Medicine

## 2023-07-04 ENCOUNTER — Encounter: Payer: Self-pay | Admitting: Internal Medicine

## 2023-07-04 VITALS — BP 134/72 | HR 60 | Ht 69.0 in | Wt 169.2 lb

## 2023-07-04 DIAGNOSIS — E1165 Type 2 diabetes mellitus with hyperglycemia: Secondary | ICD-10-CM | POA: Diagnosis not present

## 2023-07-04 DIAGNOSIS — Z794 Long term (current) use of insulin: Secondary | ICD-10-CM | POA: Diagnosis not present

## 2023-07-04 DIAGNOSIS — E663 Overweight: Secondary | ICD-10-CM

## 2023-07-04 DIAGNOSIS — E1159 Type 2 diabetes mellitus with other circulatory complications: Secondary | ICD-10-CM

## 2023-07-04 DIAGNOSIS — E785 Hyperlipidemia, unspecified: Secondary | ICD-10-CM

## 2023-07-04 LAB — POCT GLYCOSYLATED HEMOGLOBIN (HGB A1C): Hemoglobin A1C: 6.7 % — AB (ref 4.0–5.6)

## 2023-07-04 LAB — GLUCOSE, POCT (MANUAL RESULT ENTRY): POC Glucose: 74 mg/dL (ref 70–99)

## 2023-07-04 NOTE — Patient Instructions (Addendum)
Check with your insurance whether the Dexcom is not cheaper through a supplier.  Please change: - Tresiba 4 units at bedtime - Novolog mealtime 2-4 units only before meals  Please return in 4 months.
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
## 2023-07-05 LAB — MICROALBUMIN / CREATININE URINE RATIO
Creatinine, Urine: 103 mg/dL (ref 20–320)
Microalb Creat Ratio: 8 mg/g{creat} (ref <30)
Microalb, Ur: 0.8 mg/dL

## 2023-07-06 NOTE — Telephone Encounter (Signed)
PAP: Application for Novolog Ryan [name] has been submitted to PAP Companies: [employer], via fax  PLEASE BE ADVISED PT PAGES WAS E-FILED RECEIVED PROVIDER PAGES AND HAS SUBMITTED TO COMPANY

## 2023-07-13 ENCOUNTER — Other Ambulatory Visit (HOSPITAL_BASED_OUTPATIENT_CLINIC_OR_DEPARTMENT_OTHER): Payer: Self-pay

## 2023-07-14 ENCOUNTER — Telehealth: Payer: Self-pay

## 2023-07-14 NOTE — Telephone Encounter (Signed)
Ryan [name] flextouch pen received from [employer].  Patient notified we just received today and he can pick up anytime. Med placed in fridge, clinica area

## 2023-08-09 ENCOUNTER — Telehealth: Payer: Self-pay | Admitting: Internal Medicine

## 2023-08-09 DIAGNOSIS — J84112 Idiopathic pulmonary fibrosis: Secondary | ICD-10-CM

## 2023-08-09 NOTE — Telephone Encounter (Signed)
 IPF patient lost to followup. Please give first available but after spiro/dlco. 15 min fine

## 2023-08-12 NOTE — Telephone Encounter (Signed)
LVMTCB to schedule appointments.

## 2023-08-15 ENCOUNTER — Other Ambulatory Visit (HOSPITAL_BASED_OUTPATIENT_CLINIC_OR_DEPARTMENT_OTHER): Payer: Self-pay

## 2023-08-16 NOTE — Telephone Encounter (Signed)
 LMTCB

## 2023-08-22 NOTE — Telephone Encounter (Signed)
 I don't see any additional documentation regarding his 2025 application for Novo Nordisk - Tresiba , Novolog  and pen needles. Will check in the Med Assistance Team. From what I can tell 2025 application was competed with endocrinology office information so will likely be delivered to them.   I also see that he has [employer] and [employer] Care plan - He might not need medication assistance program for 2025 since all his diabetes relates medication should have $0 on this plan.

## 2023-08-26 ENCOUNTER — Other Ambulatory Visit (HOSPITAL_BASED_OUTPATIENT_CLINIC_OR_DEPARTMENT_OTHER): Payer: Self-pay

## 2023-08-29 ENCOUNTER — Other Ambulatory Visit (HOSPITAL_COMMUNITY): Payer: Self-pay

## 2023-08-29 ENCOUNTER — Other Ambulatory Visit (HOSPITAL_BASED_OUTPATIENT_CLINIC_OR_DEPARTMENT_OTHER): Payer: Self-pay

## 2023-09-02 NOTE — Telephone Encounter (Signed)
PAP: Patient assistance application for Novolog and Evaristo Bury has been approved by PAP Companies: [employer] from 10/2022 to 10/09/2023. Medication should be delivered to PAP Delivery: Provider's office. For further shipping updates, please [employer] at [phone]. Patient ID is: no id And novofine pen needles too.   Please be advised I called and spoke to representative about why was pt only approved until 10/2023 she stated he was sent out 3 order for all meds for a 4 month supply and that covered the rest of the year he would need to to re-enrollment 09/14/2023 30 days prior to expired date

## 2023-09-05 ENCOUNTER — Other Ambulatory Visit (HOSPITAL_COMMUNITY): Payer: Self-pay

## 2023-09-05 NOTE — Telephone Encounter (Signed)
Called patient to discuss 2025 Medicare plan with [employer]. He states he has been waiting several days for prior authorization for DexCom 7 sensors.  Created request on Cover My Meds - Greogry Goodwyn (Key: [id number])

## 2023-09-06 ENCOUNTER — Other Ambulatory Visit (HOSPITAL_BASED_OUTPATIENT_CLINIC_OR_DEPARTMENT_OTHER): Payer: Self-pay

## 2023-09-06 ENCOUNTER — Other Ambulatory Visit (HOSPITAL_COMMUNITY): Payer: Self-pay

## 2023-09-06 ENCOUNTER — Other Ambulatory Visit: Payer: Self-pay

## 2023-09-23 ENCOUNTER — Other Ambulatory Visit (HOSPITAL_BASED_OUTPATIENT_CLINIC_OR_DEPARTMENT_OTHER): Payer: Self-pay

## 2023-09-23 ENCOUNTER — Other Ambulatory Visit: Payer: Self-pay | Admitting: Family

## 2023-09-23 MED ORDER — ALENDRONATE SODIUM 70 MG PO TABS
70.0000 mg | ORAL_TABLET | ORAL | 4 refills | Status: AC
  Filled 2023-09-23 – 2023-10-03 (×2): qty 12, 84d supply, fill #0
  Filled 2023-12-26: qty 12, 84d supply, fill #1
  Filled 2024-03-20: qty 12, 84d supply, fill #2

## 2023-09-23 MED ORDER — METOPROLOL SUCCINATE ER 25 MG PO TB24
ORAL_TABLET | Freq: Every day | ORAL | 1 refills | Status: DC
  Filled 2023-09-23: qty 90, fill #0
  Filled 2023-10-03: qty 90, 90d supply, fill #0
  Filled 2023-12-26: qty 90, 90d supply, fill #1

## 2023-09-23 MED ORDER — ATORVASTATIN CALCIUM 40 MG PO TABS
40.0000 mg | ORAL_TABLET | Freq: Every day | ORAL | 1 refills | Status: DC
  Filled 2023-09-23 – 2023-10-03 (×2): qty 90, 90d supply, fill #0
  Filled 2023-12-26: qty 90, 90d supply, fill #1

## 2023-10-03 ENCOUNTER — Other Ambulatory Visit (HOSPITAL_BASED_OUTPATIENT_CLINIC_OR_DEPARTMENT_OTHER): Payer: Self-pay

## 2023-10-03 ENCOUNTER — Telehealth: Payer: Self-pay

## 2023-10-03 NOTE — Telephone Encounter (Signed)
 Current approval expires 10-09-23 with [employer] . Left HIPAA compliant V/M to start re-enrollment process.

## 2023-10-04 NOTE — Telephone Encounter (Signed)
 PAP: Application for Novolog and Evaristo Bury has been submitted to [employer], online/ waiting on provider pages to send to [employer]. Patient gave verbal consent to E-File his part of application.

## 2023-10-11 ENCOUNTER — Ambulatory Visit: Payer: PPO | Admitting: Internal Medicine

## 2023-10-20 NOTE — Telephone Encounter (Signed)
 Patient has [employer] Plan for 2025, so does not need PAP for 2025.

## 2023-10-26 ENCOUNTER — Other Ambulatory Visit (HOSPITAL_BASED_OUTPATIENT_CLINIC_OR_DEPARTMENT_OTHER): Payer: Self-pay

## 2023-10-28 ENCOUNTER — Other Ambulatory Visit (HOSPITAL_BASED_OUTPATIENT_CLINIC_OR_DEPARTMENT_OTHER): Payer: Self-pay

## 2023-11-04 ENCOUNTER — Ambulatory Visit: Payer: PPO | Admitting: Internal Medicine

## 2023-11-04 ENCOUNTER — Other Ambulatory Visit (HOSPITAL_BASED_OUTPATIENT_CLINIC_OR_DEPARTMENT_OTHER): Payer: Self-pay

## 2023-11-04 ENCOUNTER — Encounter: Payer: Self-pay | Admitting: Internal Medicine

## 2023-11-04 VITALS — BP 120/60 | HR 69 | Ht 69.0 in | Wt 167.0 lb

## 2023-11-04 DIAGNOSIS — E663 Overweight: Secondary | ICD-10-CM

## 2023-11-04 DIAGNOSIS — E1159 Type 2 diabetes mellitus with other circulatory complications: Secondary | ICD-10-CM | POA: Diagnosis not present

## 2023-11-04 DIAGNOSIS — E785 Hyperlipidemia, unspecified: Secondary | ICD-10-CM | POA: Diagnosis not present

## 2023-11-04 DIAGNOSIS — E1165 Type 2 diabetes mellitus with hyperglycemia: Secondary | ICD-10-CM | POA: Diagnosis not present

## 2023-11-04 DIAGNOSIS — Z794 Long term (current) use of insulin: Secondary | ICD-10-CM

## 2023-11-04 LAB — POCT GLYCOSYLATED HEMOGLOBIN (HGB A1C): Hemoglobin A1C: 7.4 % — AB (ref 4.0–5.6)

## 2023-11-04 MED ORDER — INSULIN ASPART 100 UNIT/ML FLEXPEN
2.0000 [IU] | PEN_INJECTOR | Freq: Three times a day (TID) | SUBCUTANEOUS | 3 refills | Status: AC
  Filled 2023-11-04 (×2): qty 3, 28d supply, fill #0
  Filled 2023-11-28: qty 3, 28d supply, fill #1
  Filled 2023-12-26: qty 3, 28d supply, fill #2
  Filled 2024-03-23: qty 3, 28d supply, fill #3
  Filled 2024-04-19: qty 3, 28d supply, fill #4
  Filled 2024-05-11 (×2): qty 3, 28d supply, fill #5

## 2023-11-04 MED ORDER — TRESIBA FLEXTOUCH 100 UNIT/ML ~~LOC~~ SOPN
4.0000 [IU] | PEN_INJECTOR | Freq: Every day | SUBCUTANEOUS | 3 refills | Status: AC
  Filled 2023-11-04: qty 3, 56d supply, fill #0
  Filled 2023-12-26: qty 3, 56d supply, fill #1
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
## 2023-11-04 NOTE — Patient Instructions (Addendum)
 Please continue: - Tresiba 4 units at bedtime - Novolog mealtime 2-3 units only before meals  Please return in 3-4 months.

## 2023-11-04 NOTE — Addendum Note (Signed)
 Addended by: Carlus Pavlov on: 11/04/2023 12:03 PM   Modules accepted: Level of Service

## 2023-11-14 ENCOUNTER — Encounter (HOSPITAL_BASED_OUTPATIENT_CLINIC_OR_DEPARTMENT_OTHER)

## 2023-11-14 ENCOUNTER — Ambulatory Visit: Payer: PPO | Admitting: Internal Medicine

## 2023-11-17 ENCOUNTER — Encounter

## 2023-11-28 ENCOUNTER — Other Ambulatory Visit (HOSPITAL_BASED_OUTPATIENT_CLINIC_OR_DEPARTMENT_OTHER): Payer: Self-pay

## 2023-11-28 ENCOUNTER — Other Ambulatory Visit (HOSPITAL_COMMUNITY): Payer: Self-pay

## 2023-12-26 ENCOUNTER — Other Ambulatory Visit (HOSPITAL_BASED_OUTPATIENT_CLINIC_OR_DEPARTMENT_OTHER): Payer: Self-pay

## 2023-12-26 ENCOUNTER — Other Ambulatory Visit: Payer: Self-pay | Admitting: Internal Medicine

## 2023-12-26 DIAGNOSIS — I4892 Unspecified atrial flutter: Secondary | ICD-10-CM

## 2023-12-26 MED ORDER — APIXABAN 5 MG PO TABS
5.0000 mg | ORAL_TABLET | Freq: Two times a day (BID) | ORAL | 5 refills | Status: DC
  Filled 2023-12-26: qty 60, 30d supply, fill #0
  Filled 2024-01-26: qty 60, 30d supply, fill #1
  Filled 2024-02-27: qty 60, 30d supply, fill #2
  Filled 2024-04-03 – 2024-04-04 (×2): qty 60, 30d supply, fill #3
  Filled 2024-05-11: qty 60, 30d supply, fill #4

## 2023-12-26 NOTE — Telephone Encounter (Signed)
 Prescription refill request for Eliquis  received. Indication: A Flutter Last office visit:  03/17/23  Ryan Barton MD Scr: 0.97 on 07/01/23  Epic Age: 78 Weight: 75.1kg  Based on above findings Eliquis  5mg  twice daily is the appropriate dose.  Refill approved.

## 2023-12-27 ENCOUNTER — Other Ambulatory Visit (HOSPITAL_BASED_OUTPATIENT_CLINIC_OR_DEPARTMENT_OTHER): Payer: Self-pay

## 2023-12-29 ENCOUNTER — Encounter

## 2023-12-30 ENCOUNTER — Ambulatory Visit: Payer: PPO | Admitting: Family

## 2024-01-26 ENCOUNTER — Other Ambulatory Visit (HOSPITAL_BASED_OUTPATIENT_CLINIC_OR_DEPARTMENT_OTHER): Payer: Self-pay

## 2024-02-03 ENCOUNTER — Ambulatory Visit (INDEPENDENT_AMBULATORY_CARE_PROVIDER_SITE_OTHER): Admitting: Family

## 2024-02-03 VITALS — BP 123/72 | HR 70 | Temp 98.2°F | Resp 16 | Ht 69.0 in | Wt 158.0 lb

## 2024-02-03 DIAGNOSIS — E118 Type 2 diabetes mellitus with unspecified complications: Secondary | ICD-10-CM

## 2024-02-03 DIAGNOSIS — Z794 Long term (current) use of insulin: Secondary | ICD-10-CM | POA: Diagnosis not present

## 2024-02-03 DIAGNOSIS — M810 Age-related osteoporosis without current pathological fracture: Secondary | ICD-10-CM | POA: Diagnosis not present

## 2024-02-03 DIAGNOSIS — R634 Abnormal weight loss: Secondary | ICD-10-CM | POA: Insufficient documentation

## 2024-02-03 DIAGNOSIS — J84112 Idiopathic pulmonary fibrosis: Secondary | ICD-10-CM

## 2024-02-03 DIAGNOSIS — I10 Essential (primary) hypertension: Secondary | ICD-10-CM | POA: Diagnosis not present

## 2024-02-03 DIAGNOSIS — E7800 Pure hypercholesterolemia, unspecified: Secondary | ICD-10-CM

## 2024-02-03 DIAGNOSIS — K7460 Unspecified cirrhosis of liver: Secondary | ICD-10-CM | POA: Diagnosis not present

## 2024-02-03 DIAGNOSIS — I4892 Unspecified atrial flutter: Secondary | ICD-10-CM

## 2024-02-03 NOTE — Assessment & Plan Note (Signed)
 Continues fosamax  and calcium .

## 2024-02-03 NOTE — Assessment & Plan Note (Signed)
BP stable on metoprolol.  Continue same.

## 2024-02-03 NOTE — Patient Instructions (Signed)
 VISIT SUMMARY:  You visited us  today due to persistent fatigue and shortness of breath, which are likely related to your chronic lung disease. We also discussed your diabetes management, heart condition, and osteoporosis treatment. Additionally, we reviewed your general health maintenance and upcoming specialist appointments.  YOUR PLAN:  CHRONIC LUNG DISEASE: Your fatigue and shortness of breath are likely due to your chronic lung disease, which also affects your exercise capacity and appetite. -We will reassess your weight in 6 weeks. -Try to eat small, frequent snacks to increase your caloric intake.  TYPE 2 DIABETES MELLITUS: Your HbA1c level is at 7.4%, indicating that your blood sugar control could be better. -Continue with your current diabetes management regimen.  ATRIAL FLUTTER: Your heart rate is well-controlled with your current medications. -Continue taking Eliquis  for anticoagulation. -Continue taking metoprolol  for rate control.  OSTEOPOROSIS: Your osteoporosis is currently well-managed with your medications. -Continue taking Fosamax  and calcium  supplements.  GENERAL HEALTH MAINTENANCE: You are up to date with most of your vaccines, but a COVID-19 booster is recommended in the fall. -Get a COVID-19 booster in the fall when it becomes available. -Stay updated on vaccination guidelines.  FOLLOW-UP: We need to monitor your weight and overall health status. -You have appointments with your lung specialist on July 22 and endocrinologist on August 8. -Schedule a follow-up appointment with us  in 6 weeks to reassess your weight and health status.

## 2024-02-03 NOTE — Assessment & Plan Note (Signed)
 Rate stable on metoprolol , continue eliquis .

## 2024-02-03 NOTE — Assessment & Plan Note (Signed)
 Notes some chronic fatigue that he attributes to his lung disease. Has follow up scheduled with pulmonology.

## 2024-02-03 NOTE — Assessment & Plan Note (Signed)
 Lab Results  Component Value Date   HGBA1C 7.4 (A) 11/04/2023   HGBA1C 6.7 (A) 07/04/2023   HGBA1C 7.1 (A) 03/01/2023   Lab Results  Component Value Date   MICROALBUR 0.8 07/04/2023   LDLCALC 55 03/02/2023   CREATININE 0.97 07/01/2023   Fair control on tresiba .  Management per Endo.

## 2024-02-03 NOTE — Progress Notes (Signed)
 Subjective:     Patient ID: Ryan [name], male    DOB: 09-04-1945, 78 y.o.   MRN: [id number]  Chief Complaint  Patient presents with   Hypertension    Here for follow up   Diabetes    Here for follow up    Hypertension  Diabetes    Discussed the use of AI scribe software for clinical note transcription with the patient, who gave verbal consent to proceed.  History of Present Illness  Ryan [name] is a 78 year old male with lung disease who presents with fatigue and shortness of breath. He experiences persistent fatigue and shortness of breath, which he attributes to his lung disease and aging. This fatigue is unusual for him and occurs even without exertion. His lung disease limits his exercise capacity, causing shortness of breath and difficulty breathing during physical activity. He notes a decrease in appetite and weight loss from 167 lbs to 158 lbs, with a reduced sense of hunger. He attributes some of this to familial patterns, as his parents also ate less as they aged.      Health Maintenance Due  Topic Date Due   DTaP/Tdap/Td (3 - Tdap) 08/24/2021   COVID-19 Vaccine (4 - 2024-25 season) 08/13/2023   Medicare Annual Wellness (AWV)  02/15/2024    Past Medical History:  Diagnosis Date   Allergy    Arthritis    Diabetes mellitus    GERD (gastroesophageal reflux disease)    History of chicken pox    Hyperlipidemia    Hypertension    IPF (idiopathic pulmonary fibrosis) (HCC)    Myocardial infarction (HCC) 2017   Osteoporosis    Type 2 diabetes mellitus with hyperglycemia, with long-term current use of insulin  (HCC) 09/08/2015    Past Surgical History:  Procedure Laterality Date   EYE SURGERY  1998   bilateral   SHOULDER SURGERY  1990   right shoulder, torn rotator cuff.    Family History  Problem Relation Age of Onset   Cancer Mother        cancer   Heart disease Mother    Hyperlipidemia Mother    Hypertension Mother    Hyperlipidemia Father     Stroke Father    Hypertension Father    Diabetes Paternal Grandfather    Colon cancer Neg Hx    Stomach cancer Neg Hx    Esophageal cancer Neg Hx     Social History   Socioeconomic History   Marital status: Widowed    Spouse name: Not on file   Number of children: 1   Years of education: Not on file   Highest education level: Not on file  Occupational History   Occupation: retired    Comment: from retail  Tobacco Use   Smoking status: Former    Current packs/day: 0.00    Types: Cigarettes    Quit date: 09/01/1965    Years since quitting: 58.4   Smokeless tobacco: Never   Tobacco comments:    Smoked about 1cig a week for about 6 months.  Vaping Use   Vaping status: Never Used  Substance and Sexual Activity   Alcohol use: No   Drug use: No   Sexual activity: Never  Other Topics Concern   Not on file  Social History Narrative   Regular exercise:  5 x weekly   Caffeine Use: 1 cups coffee daily.   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   Son, daughter in law and young adult granddaughter  and grandson live with client   Wife died from ovarian cancer in 18-Feb-2015         Social Drivers of Health   Financial Resource Strain: Low Risk  (02/15/2023)   Overall Financial Resource Strain (CARDIA)    Difficulty of Paying Living Expenses: Not hard at all  Food Insecurity: No Food Insecurity (12/13/2022)   Hunger Vital Sign    Worried About Running Out of Food in the Last Year: Never true    Ran Out of Food in the Last Year: Never true  Transportation Needs: No Transportation Needs (12/13/2022)   PRAPARE - Administrator, Civil Service (Medical): No    Lack of Transportation (Non-Medical): No  Physical Activity: Insufficiently Active (02/15/2023)   Exercise Vital Sign    Days of Exercise per Week: 3 days    Minutes of Exercise per Session: 30 min  Stress: No Stress Concern Present (02/15/2023)   [employer] of Occupational Health - Occupational Stress Questionnaire     Feeling of Stress : Not at all  Social Connections: Socially Isolated (02/15/2023)   Social Connection and Isolation Panel    Frequency of Communication with Friends and Family: Twice a week    Frequency of Social Gatherings with Friends and Family: Never    Attends Religious Services: Never    Database administrator or Organizations: No    Attends Banker Meetings: Never    Marital Status: Widowed  Intimate Partner Violence: Not At Risk (12/09/2022)   Humiliation, Afraid, Rape, and Kick questionnaire    Fear of Current or Ex-Partner: No    Emotionally Abused: No    Physically Abused: No    Sexually Abused: No    Outpatient Medications Prior to Visit  Medication Sig Dispense Refill   alendronate  (FOSAMAX ) 70 MG tablet Take 1 tablet (70 mg total) by mouth every 7 (seven) days. Take with a full glass of water on an empty stomach. 12 tablet 4   apixaban  (ELIQUIS ) 5 MG TABS tablet Take 1 tablet (5 mg total) by mouth 2 (two) times daily. 60 tablet 5   atorvastatin  (LIPITOR) 40 MG tablet TAKE 1 TABLET (40 MG TOTAL) BY MOUTH DAILY. 90 tablet 1   Calcium  Carbonate-Vitamin D 600-400 MG-UNIT tablet Take 1 tablet by mouth 2 (two) times daily. Reported on 01/27/2016     cholecalciferol (VITAMIN D3) 25 MCG (1000 UNIT) tablet Take 1,000 Units by mouth daily.     Continuous Glucose Sensor (DEXCOM G7 SENSOR) MISC Apply 1 sensor every 10 days. 9 each 4   insulin  aspart (NOVOLOG ) 100 UNIT/ML FlexPen Inject 2-3 Units into the skin 3 (three) times daily before meals. 15 mL 3   insulin  degludec (TRESIBA  FLEXTOUCH) 100 UNIT/ML FlexTouch Pen Inject 4-5 Units into the skin daily. 9 mL 3   Insulin  Syringe-Needle U-100 (TRUEPLUS INSULIN  SYRINGE) 31G X 5/16 0.5 ML MISC USE TO INJECT INSULIN  4 TIMES A DAY 300 each 3   metoprolol  succinate (TOPROL -XL) 25 MG 24 hr tablet TAKE 1 TABLET (25 MG TOTAL) BY MOUTH DAILY. 90 tablet 1   No facility-administered medications prior to visit.    Allergies  Allergen  Reactions   Other Other (See Comments)    Client states he became very sick and had significant skin inflammation and irritation with the Freestyle Libre sensor   Lisinopril      Cough     ROS    See HPI Objective:    Physical Exam Constitutional:  General: He is not in acute distress.    Appearance: He is well-developed.  HENT:     Head: Normocephalic and atraumatic.   Cardiovascular:     Rate and Rhythm: Normal rate and regular rhythm.     Heart sounds: No murmur heard. Pulmonary:     Effort: Pulmonary effort is normal. No respiratory distress.     Breath sounds: Normal breath sounds. No wheezing or rales.   Skin:    General: Skin is warm and dry.     Coloration: Skin is jaundiced (seems slightly jaundiced?).   Neurological:     Mental Status: He is alert and oriented to person, place, and time.   Psychiatric:        Behavior: Behavior normal.        Thought Content: Thought content normal.      BP 123/72 (BP Location: Right Arm, Patient Position: Sitting, Cuff Size: Small)   Pulse 70   Temp 98.2 F (36.8 C) (Oral)   Resp 16   Ht 5' 9 (1.753 m)   Wt 158 lb (71.7 kg)   SpO2 95%   BMI 23.33 kg/m  Wt Readings from Last 3 Encounters:  02/03/24 158 lb (71.7 kg)  11/04/23 167 lb (75.8 kg)  07/04/23 169 lb 3.2 oz (76.7 kg)       Assessment & Plan:   Problem List Items Addressed This Visit       Unprioritized   Weight loss   New.  Check TSH. Encouraged increased snacks throughout the day. Plan to re-weigh in 6 weeks. If continued weight loss will consider further work up.       Relevant Orders   TSH   Osteoporosis - Primary   Continues fosamax  and calcium .       IPF (idiopathic pulmonary fibrosis) (HCC)   Notes some chronic fatigue that he attributes to his lung disease. Has follow up scheduled with pulmonology.       Hyperlipidemia   Lab Results  Component Value Date   CHOL 131 03/02/2023   HDL 44.70 03/02/2023   LDLCALC 55 03/02/2023    TRIG 159.0 (H) 03/02/2023   CHOLHDL 3 03/02/2023   Due for follow up lipid panel. Continue lipitor.      Relevant Orders   Lipid panel   Comp Met (CMET)   Hyperbilirubinemia   Appears a bit jaundiced today. Will update bilirubin level.       HTN (hypertension)   BP stable on metoprolol . Continue same.       Controlled diabetes mellitus type 2 with complications [facility])   Lab Results  Component Value Date   HGBA1C 7.4 (A) 11/04/2023   HGBA1C 6.7 (A) 07/04/2023   HGBA1C 7.1 (A) 03/01/2023   Lab Results  Component Value Date   MICROALBUR 0.8 07/04/2023   LDLCALC 55 03/02/2023   CREATININE 0.97 07/01/2023   Fair control on tresiba .  Management per Endo.       Relevant Orders   Urine Microalbumin w/creat. ratio   Cirrhosis of liver without ascites (HCC)   Noted on CT. Saw GI back in 2023 and they felt that finding was clinically insignificant given mild changes on imaging and no hepatic synthetic  dysfunction.  Continue to monitor LFT's.      Atrial flutter (HCC)   Rate stable on metoprolol , continue eliquis .        I am having Ryan [name] maintain his Calcium  Carbonate-Vitamin D, cholecalciferol, Insulin  Syringe-Needle U-100, Dexcom G7 Sensor, alendronate ,  metoprolol  succinate, atorvastatin , insulin  aspart, Tresiba  FlexTouch, and apixaban .  No orders of the defined types were placed in this encounter.

## 2024-02-03 NOTE — Assessment & Plan Note (Signed)
 Appears a bit jaundiced today. Will update bilirubin level.

## 2024-02-03 NOTE — Assessment & Plan Note (Signed)
 Noted on CT. Saw GI back in 2023 and they felt that finding was clinically insignificant given mild changes on imaging and no hepatic synthetic  dysfunction.  Continue to monitor LFT's.

## 2024-02-03 NOTE — Assessment & Plan Note (Signed)
 Lab Results  Component Value Date   CHOL 131 03/02/2023   HDL 44.70 03/02/2023   LDLCALC 55 03/02/2023   TRIG 159.0 (H) 03/02/2023   CHOLHDL 3 03/02/2023   Due for follow up lipid panel. Continue lipitor.

## 2024-02-03 NOTE — Assessment & Plan Note (Signed)
 New.  Check TSH. Encouraged increased snacks throughout the day. Plan to re-weigh in 6 weeks. If continued weight loss will consider further work up.

## 2024-02-04 ENCOUNTER — Ambulatory Visit: Payer: Self-pay | Admitting: Family

## 2024-02-04 LAB — COMPREHENSIVE METABOLIC PANEL WITH GFR
AG Ratio: 1.3 (calc) (ref 1.0–2.5)
ALT: 11 U/L (ref 9–46)
AST: 15 U/L (ref 10–35)
Albumin: 4.1 g/dL (ref 3.6–5.1)
Alkaline phosphatase (APISO): 65 U/L (ref 35–144)
BUN: 19 mg/dL (ref 7–25)
CO2: 28 mmol/L (ref 20–32)
Calcium: 9.7 mg/dL (ref 8.6–10.3)
Chloride: 103 mmol/L (ref 98–110)
Creat: 0.98 mg/dL (ref 0.70–1.28)
Globulin: 3.2 g/dL (ref 1.9–3.7)
Glucose, Bld: 143 mg/dL — ABNORMAL HIGH (ref 65–99)
Potassium: 5 mmol/L (ref 3.5–5.3)
Sodium: 137 mmol/L (ref 135–146)
Total Bilirubin: 1.5 mg/dL — ABNORMAL HIGH (ref 0.2–1.2)
Total Protein: 7.3 g/dL (ref 6.1–8.1)
eGFR: 79 mL/min/{1.73_m2} (ref >=60)

## 2024-02-04 LAB — MICROALBUMIN / CREATININE URINE RATIO
Creatinine, Urine: 265 mg/dL (ref 20–320)
Microalb Creat Ratio: 6 mg/g{creat} (ref <30)
Microalb, Ur: 1.7 mg/dL

## 2024-02-04 LAB — LIPID PANEL
Cholesterol: 111 mg/dL (ref <200)
HDL: 43 mg/dL (ref >=40)
LDL Cholesterol (Calc): 52 mg/dL
Non-HDL Cholesterol (Calc): 68 mg/dL (ref <130)
Total CHOL/HDL Ratio: 2.6 (calc) (ref <5.0)
Triglycerides: 79 mg/dL (ref <150)

## 2024-02-04 LAB — TSH: TSH: 4.46 m[IU]/L (ref 0.40–4.50)

## 2024-02-14 ENCOUNTER — Ambulatory Visit

## 2024-02-21 ENCOUNTER — Other Ambulatory Visit (HOSPITAL_COMMUNITY): Payer: Self-pay

## 2024-02-25 IMAGING — CR DG CHEST 2V
2 series · 2 of 2 positions shown · non-contrast
Comparison: Chest x-ray dated April 02, 2020.

CLINICAL DATA: URI symptoms.

EXAM:
CHEST - 2 VIEW

[chest pa]
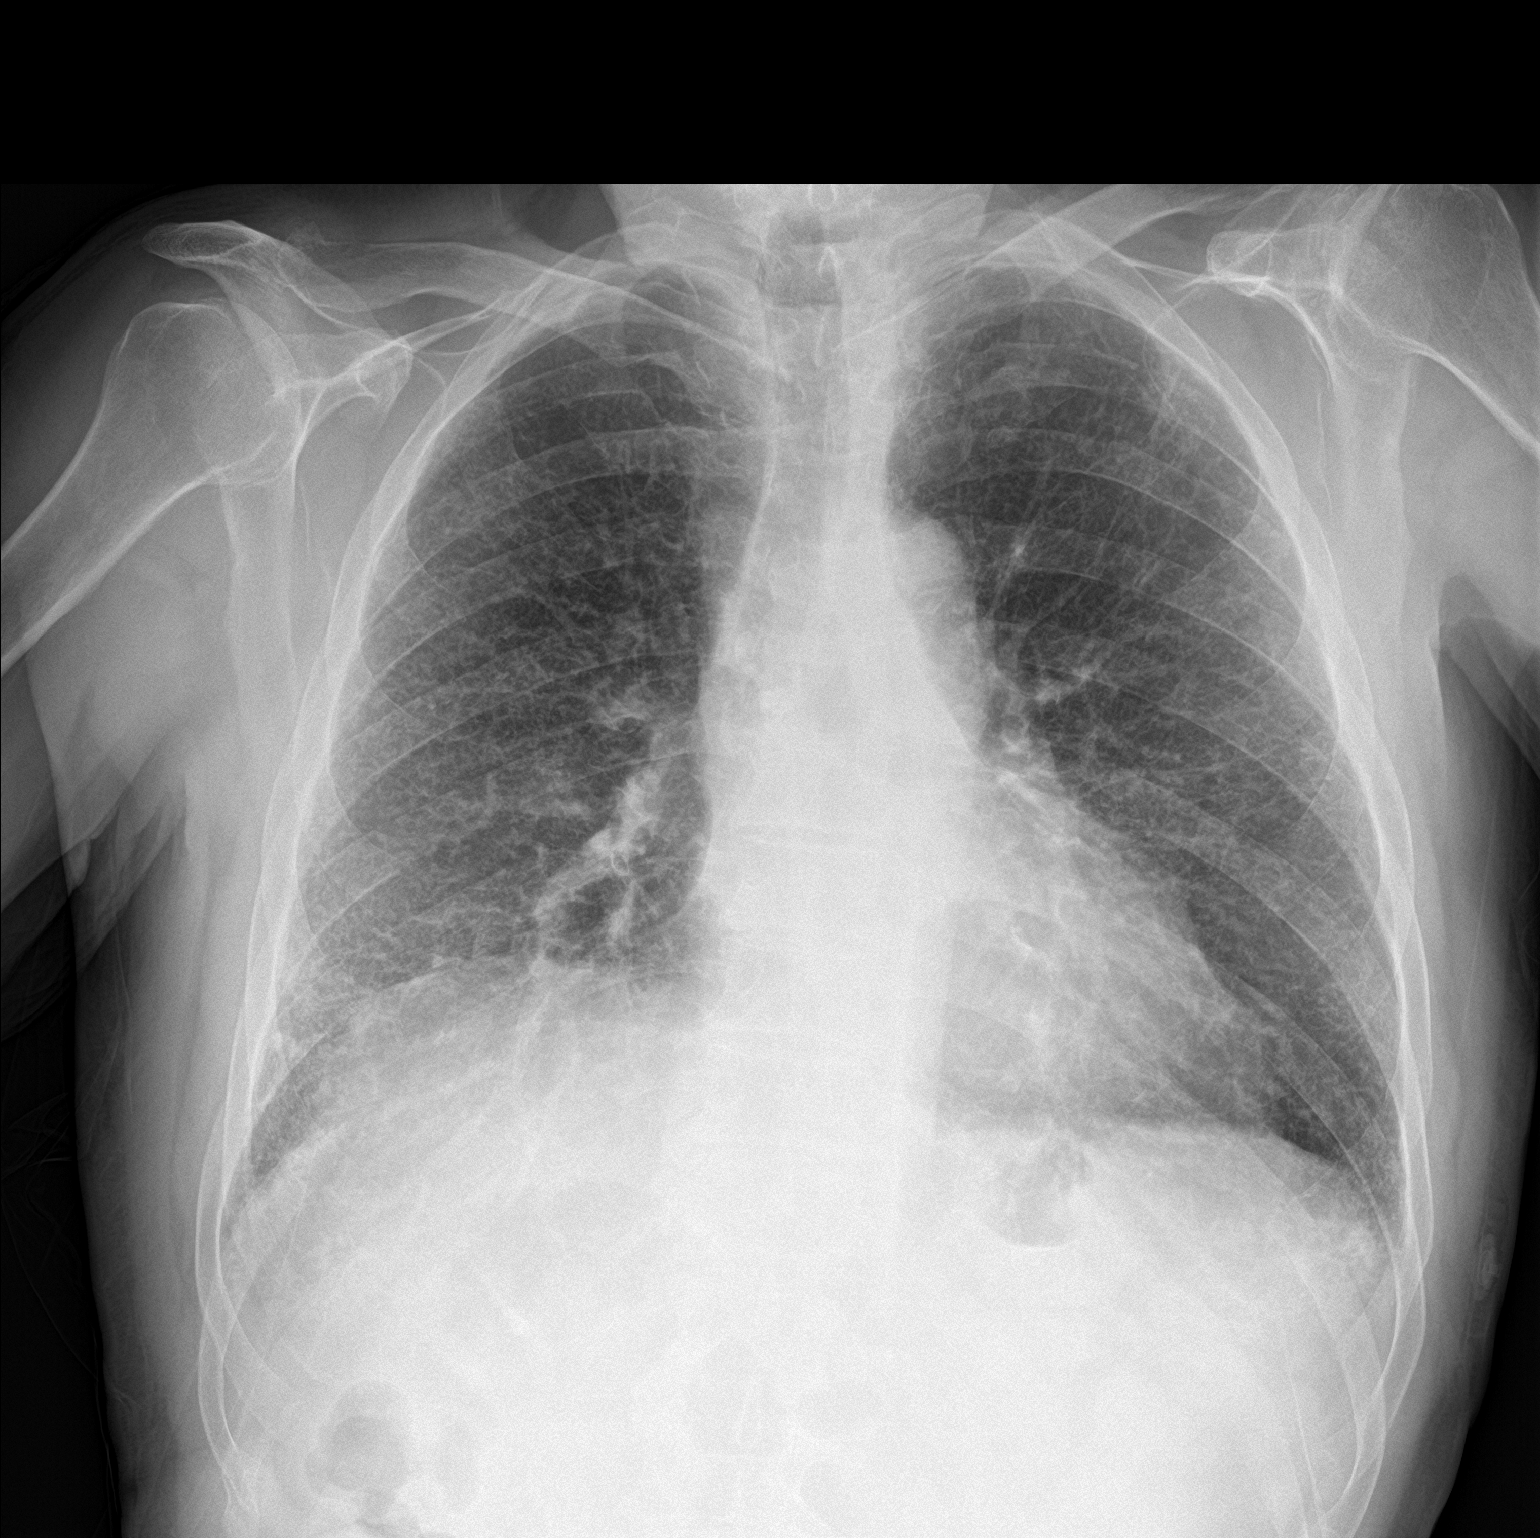

[chest lat]
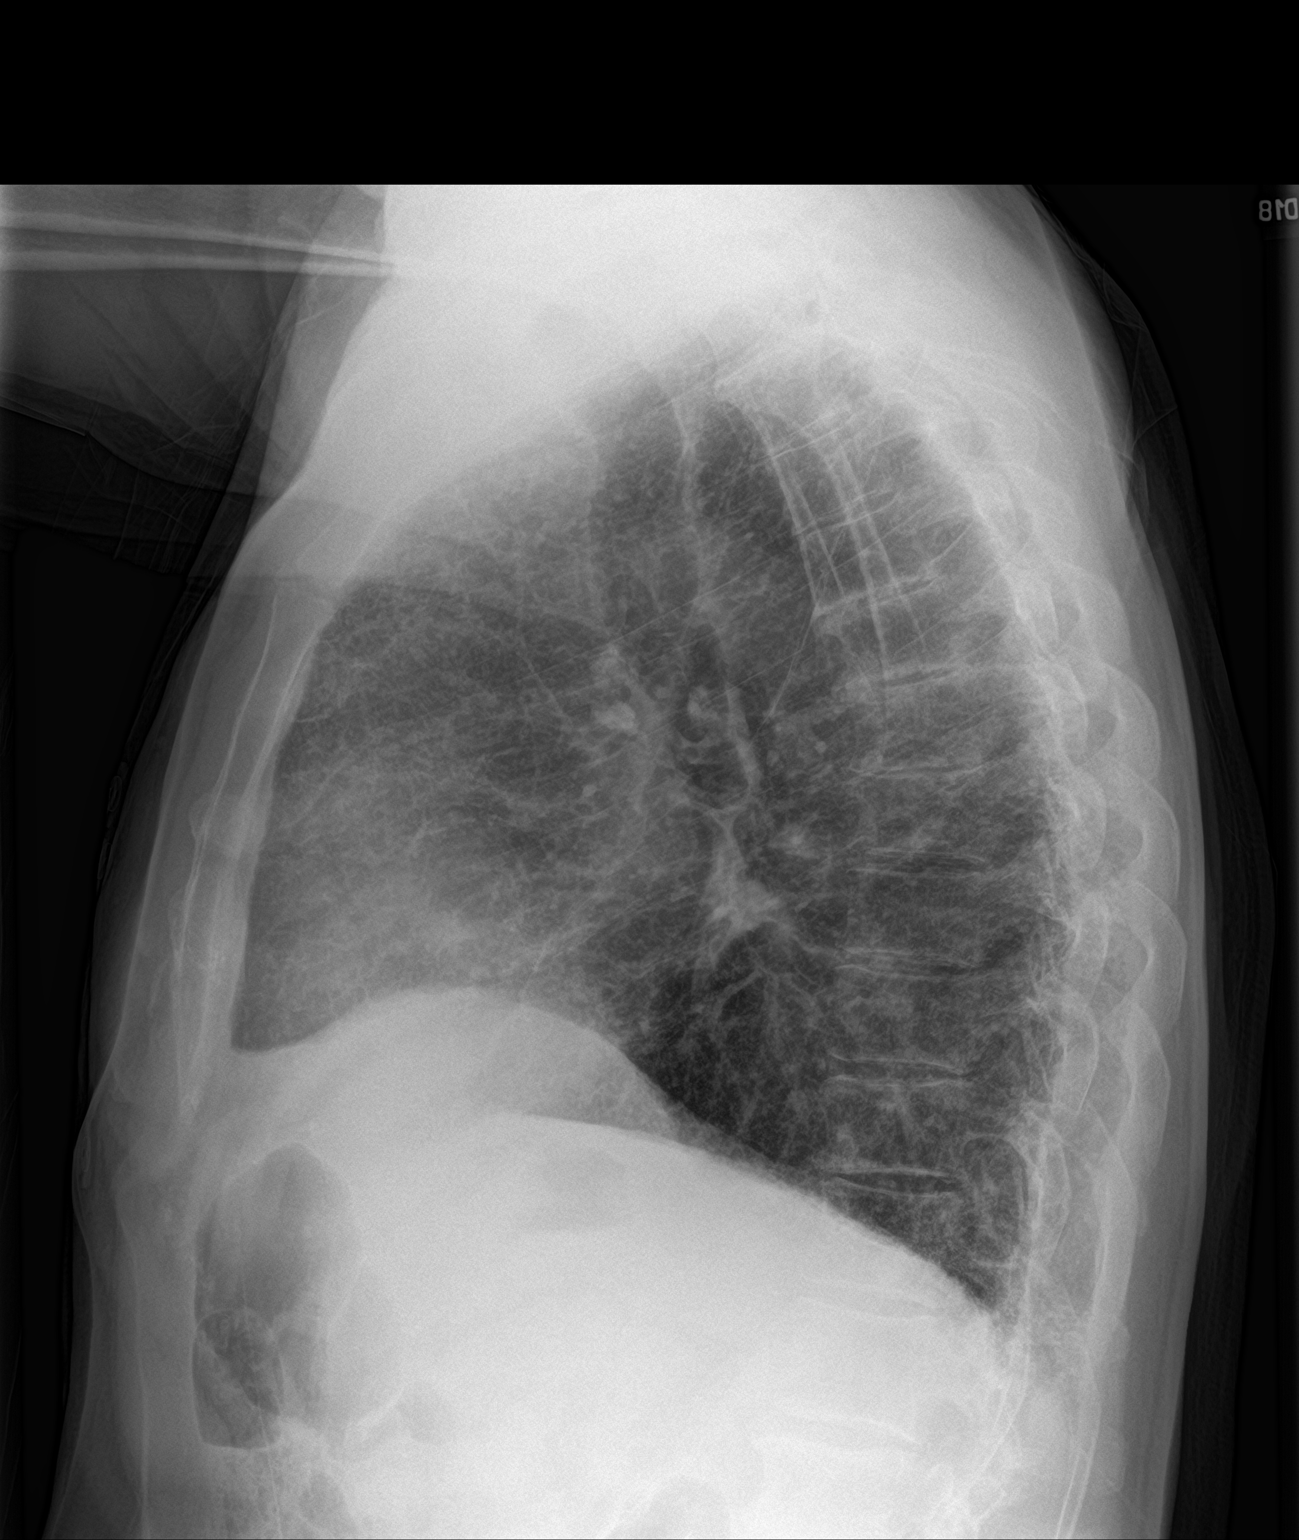

[2 of 2 positions shown; findings below may reference images not displayed]

FINDINGS: The heart size and mediastinal contours are within normal limits.
Progressive peripheral and basilar predominant coarse interstitial
thickening. No focal consolidation, pleural effusion, or
pneumothorax. No acute osseous abnormality.
IMPRESSION: 1. Progressive peripheral and basilar predominant coarse
interstitial thickening, suspicious for chronic interstitial lung
disease. Recommend non-emergent outpatient high-resolution chest CT
for further evaluation.

## 2024-02-27 ENCOUNTER — Other Ambulatory Visit (HOSPITAL_BASED_OUTPATIENT_CLINIC_OR_DEPARTMENT_OTHER): Payer: Self-pay

## 2024-02-28 ENCOUNTER — Ambulatory Visit: Admitting: Internal Medicine

## 2024-02-29 ENCOUNTER — Encounter: Payer: Self-pay | Admitting: Internal Medicine

## 2024-02-29 ENCOUNTER — Ambulatory Visit: Admitting: Internal Medicine

## 2024-02-29 VITALS — BP 118/76 | HR 74 | Ht 69.0 in | Wt 161.0 lb

## 2024-02-29 DIAGNOSIS — J84112 Idiopathic pulmonary fibrosis: Secondary | ICD-10-CM | POA: Diagnosis not present

## 2024-02-29 NOTE — Patient Instructions (Addendum)
 ICD-10-CM   1. IPF (idiopathic pulmonary fibrosis) (HCC)  J84.112        Milght be mildly worse   Plan  - do spiro and dlco in 8-12 weeks  - respect concern of DKA with GI side effects of approved antifibrotics Do HRCTin 8-12 weks  FOllwuop - DR Geronimo  in 8-12 weeks after above tests  - based on results can consider IV infusion trial for IPF

## 2024-02-29 NOTE — Progress Notes (Signed)
 10/29/2022 -  IPF followup -on supportive care  HPI Ryan [name] 78 y.o. -returns for follow-up.  He was not interested in standard of care therapy for IPF.  He opted to going to a trial that involved modified pirfenidone versus placebo.  However he screen failed.  He is currently states he is feeling asymptomatic.  And is stable.  Walking desaturation test also stable.  In February 2024 he did have a high-resolution CT chest that showed mild progression in his pulmonary fibrosis.  I again had a conversation with him with the approved antifibrotic drugs.  It appears his biggest issue is cost.  He is already paying $145 a month for Eliquis  and up to $400 a month for insulin .  He says he has 4 people living in his house including his son, daughter and 2 grandchildren.  He says all of them are disabled.  He categorically denies any of them doing drugs or smoking cigarettes.  He does not smoke cigarettes either.  I did indicate to him about the new Medicare rules around donut hole.  I told him that up to 3000-3500$ would be his out-of-pocket and we could try to get charity.  This is a new CMS rules.  Based on this he does not want to consider the approved antifibrotic therapy.  However he finally agreed for our pharmacy team to do a cost analysis for him.  He does have consent form for the study called revert -by [facility].  He is definitely interested in this.  Did indicate to him the GI side effects and risk of diabetes variance with asked extremital protocols and other unknown issues.  He is willing to take the risk.  He understands his experimental protocol.  He feels his diabetes is better controlled.  We went over the following aspects of clinical research  OV 02/29/2024  Subjective:  Patient ID: Ryan [name], male , DOB: [birth year] , age 52 y.o. , MRN: [id number] , ADDRESS: [address] [place] [address] PCP Daryl Setter, NP Patient Care Team: Daryl Setter,  NP as PCP - General (Internal Medicine) Okey Vina GAILS, MD as PCP - Cardiology (Cardiology) Trixie File, MD as Consulting Physician (Endocrinology) Okey Vina GAILS, MD as Consulting Physician (Cardiology) Carla Milling, RPH-CPP (Pharmacist) Geronimo Amel, MD as Consulting Physician (Pulmonary Disease) Geronimo Amel, MD as Consulting Physician (Pulmonary Disease)  This Provider for this visit: Treatment Team:  Attending Provider: Geronimo Amel, MD    02/29/2024 -   Chief Complaint  Patient presents with   Medical Management of Chronic Issues   Interstitial Lung Disease    PFT never done- had to reschedule due to family emergencies. Breathing is overall doing well. He has occ dry cough.      HPI Ryan [name] 78 y.o. -IPF follow-up on supportive care   Returns for follow-up.  His diabetes is much better controlled he is on Dexcom monitor now.  A year ago he was in a recent study that was associated with him ending up in the ER with DKA and also had colitis.  He does not want to do any approved antifibrotic's for IPF because of the GI side effects and the risk for DKA.  He again denied that he was interested in taking any of the approved antifibrotic's [pirfenidone and nintedanib].  He says overall his health is stable.  Symptoms are stable.  He has baseline dyspnea on exertion however he did admit that his cough is slowly  getting worse.  It is on and off.  It is worse when he is supine it also gets worse with exertion.  When he coughs a lot is his blood sugar drops. Interim Health status: No new complaints No new medical problems. No new surgeries. No ER visits. No Urgent care visits. No changes to medications   Social - Son is doing well but recently his sister-in-law, cousin and nephew died.   Sit/stand test shows tendency to desaturate   SYMPTOM SCALE - ILD 02/29/2024  Current weight   O2 use ra  Shortness of Breath 0 -> 5 scale with 5 being worst (score 6 If  unable to do)  At rest 0  Simple tasks - showers, clothes change, eating, shaving 2  Household (dishes, doing bed, laundry) 2  Shopping 2  Walking level at own pace 2  Walking up Stairs 10  Total (30-36) Dyspnea Score 3  How bad is your cough? 3  How bad is your fatigue 2  How bad is appetiee 0  How bad is nausea 00  How bad is vomiting?  0  How bad is diarrhea? 0  How bad is anxiety? 0  How bad is depression 00  Any chronic pain - if so where and how bad 0        SIT STAND TEST - goal 15 times   02/29/2024    O2 used ra   PRobe - finter or forehead fast   Number sit and stand completed - goal 15 15   Time taken to complete 36 sec   Resting Pulse Ox/HR/Dyspnea  0% and 100/min and dyspnea of 72/10    Peak measures 0 % and 98/min and dyspnea of 90/10   Final Pulse Ox/HR 0% and 95/min and dyspnea of 78/10   Desaturated </= 88% no   Desaturated <= 3% points yes   Got Tachycardic >/= 90/min yes   Miscellaneous comments x      PFT     Latest Ref Rng & Units 06/22/2022    8:33 AM  PFT Results  FVC-Pre L 3.47   FVC-Predicted Pre % 89   FVC-Post L 3.51   FVC-Predicted Post % 90   Pre FEV1/FVC % % 85   Post FEV1/FCV % % 87   FEV1-Pre L 2.95   FEV1-Predicted Pre % 106   FEV1-Post L 3.06   DLCO uncorrected ml/min/mmHg 16.65   DLCO UNC% % 70   DLCO corrected ml/min/mmHg 16.65   DLCO COR %Predicted % 70   DLVA Predicted % 87   TLC L 5.18   TLC % Predicted % 77   RV % Predicted % 65        LAB RESULTS last 96 hours No results found.       has a past medical history of Allergy, Arthritis, Diabetes mellitus, GERD (gastroesophageal reflux disease), History of chicken pox, Hyperlipidemia, Hypertension, IPF (idiopathic pulmonary fibrosis) (HCC), Myocardial infarction (HCC) (2017), Osteoporosis, and Type 2 diabetes mellitus with hyperglycemia, with long-term current use of insulin  (HCC) (09/08/2015).   reports that he quit smoking about 58 years ago. His  smoking use included cigarettes. He has never used smokeless tobacco.  Past Surgical History:  Procedure Laterality Date   EYE SURGERY  1998   bilateral   SHOULDER SURGERY  1990   right shoulder, torn rotator cuff.    Allergies  Allergen Reactions   Other Other (See Comments)    Client states he became very sick and  had significant skin inflammation and irritation with the Freestyle Libre sensor   Lisinopril      Cough     Immunization History  Administered Date(s) Administered   Covid-19 Iv Non-us  Vaccine (Bibp, Sinopharm) 06/18/2023   Fluad Quad(high Dose 65+) 05/15/2019, 04/25/2020, 06/24/2021, 05/28/2022, 06/18/2023   H1N1 11/19/2008   Hepb-cpg 12/29/2021, 03/02/2022   Influenza Split 05/25/2011, 04/20/2012   Influenza Whole 06/06/2009   Influenza, High Dose Seasonal PF 04/20/2016, 05/12/2017, 05/26/2018   Influenza,inj,Quad PF,6+ Mos 05/07/2013, 05/27/2014, 05/13/2015   Influenza-Unspecified 05/09/2004, 06/09/2005, 06/15/2006, 07/11/2007, 05/15/2008, 06/06/2009   PFIZER(Purple Top)SARS-COV-2 Vaccination 09/30/2019, 10/24/2019, 07/22/2020   Pneumococcal Conjugate-13 10/19/2004, 05/11/2013   Pneumococcal Polysaccharide-23 05/25/2011, 09/08/2018   RSV,unspecified 09/19/2023   Td 08/09/2001, 08/25/2011   Zoster Recombinant(Shingrix ) 01/22/2021, 05/20/2021   Zoster, Live 08/25/2011    Family History  Problem Relation Age of Onset   Cancer Mother        cancer   Heart disease Mother    Hyperlipidemia Mother    Hypertension Mother    Hyperlipidemia Father    Stroke Father    Hypertension Father    Diabetes Paternal Grandfather    Colon cancer Neg Hx    Stomach cancer Neg Hx    Esophageal cancer Neg Hx      Current Outpatient Medications:    alendronate  (FOSAMAX ) 70 MG tablet, Take 1 tablet (70 mg total) by mouth every 7 (seven) days. Take with a full glass of water on an empty stomach., Disp: 12 tablet, Rfl: 4   apixaban  (ELIQUIS ) 5 MG TABS tablet, Take 1 tablet  (5 mg total) by mouth 2 (two) times daily., Disp: 60 tablet, Rfl: 5   atorvastatin  (LIPITOR) 40 MG tablet, TAKE 1 TABLET (40 MG TOTAL) BY MOUTH DAILY., Disp: 90 tablet, Rfl: 1   Calcium  Carbonate-Vitamin D 600-400 MG-UNIT tablet, Take 1 tablet by mouth 2 (two) times daily. Reported on 01/27/2016, Disp: , Rfl:    cholecalciferol (VITAMIN D3) 25 MCG (1000 UNIT) tablet, Take 1,000 Units by mouth daily., Disp: , Rfl:    Continuous Glucose Sensor (DEXCOM G7 SENSOR) MISC, Apply 1 sensor every 10 days., Disp: 9 each, Rfl: 4   insulin  aspart (NOVOLOG ) 100 UNIT/ML FlexPen, Inject 2-3 Units into the skin 3 (three) times daily before meals., Disp: 15 mL, Rfl: 3   insulin  degludec (TRESIBA  FLEXTOUCH) 100 UNIT/ML FlexTouch Pen, Inject 4-5 Units into the skin daily., Disp: 9 mL, Rfl: 3   metoprolol  succinate (TOPROL -XL) 25 MG 24 hr tablet, TAKE 1 TABLET (25 MG TOTAL) BY MOUTH DAILY., Disp: 90 tablet, Rfl: 1   Insulin  Syringe-Needle U-100 (TRUEPLUS INSULIN  SYRINGE) 31G X 5/16 0.5 ML MISC, USE TO INJECT INSULIN  4 TIMES A DAY, Disp: 300 each, Rfl: 3      Objective:   Vitals:   02/29/24 1553  BP: 118/76  Pulse: 74  SpO2: 98%  Weight: 161 lb (73 kg)  Height: 5' 9 (1.753 m)    Estimated body mass index is 23.78 kg/m as calculated from the following:   Height as of this encounter: 5' 9 (1.753 m).   Weight as of this encounter: 161 lb (73 kg).  @WEIGHTCHANGE @  [employer]   02/29/24 1553  Weight: 161 lb (73 kg)     Physical Exam   General: No distress. Looks well O2 at rest: no Cane present: no Sitting in wheel chair: no Frail: no Obese: non Neuro: Alert and Oriented x 3. GCS 15. Speech normal Psych: Pleasant Resp:  Barrel Chest -  ono.  Wheeze - no, Crackles - YES AT BASE, No overt respiratory distress CVS: Normal heart sounds. Murmurs - n Ext: Stigmata of Connective Tissue Disease - no HEENT: Normal upper airway. PEERL +. No post nasal drip        Assessment/     Assessment &  Plan IPF (idiopathic pulmonary fibrosis) (HCC)    PLAN Patient Instructions     ICD-10-CM   1. IPF (idiopathic pulmonary fibrosis) (HCC)  J84.112        Milght be mildly worse   Plan  - do spiro and dlco in 8-12 weeks  - respect concern of DKA with GI side effects of approved antifibrotics Do HRCTin 8-12 weks  FOllwuop - DR Geronimo  in 8-12 weeks after above tests  - based on results can consider IV infusion trial for IPF    FOLLOWUP    Return in about 10 weeks (around 05/09/2024) for 15 min visit, after Spiro and DLCO, after HRCT chest, with Dr Geronimo.    SIGNATURE    Dr. Dorethia Geronimo, M.D., F.C.C.P,  Pulmonary and Critical Care Medicine Staff Physician, [facility] Center Director - Interstitial Lung Disease  Program  Pulmonary Fibrosis [facility] at [facility] [place], [place], [address]  Pager: [phone], If no answer or between  15:00h - 7:00h: call [phone] Telephone: [phone]  4:42 PM 02/29/2024

## 2024-03-13 ENCOUNTER — Encounter: Payer: Self-pay | Admitting: Internal Medicine

## 2024-03-13 ENCOUNTER — Ambulatory Visit (INDEPENDENT_AMBULATORY_CARE_PROVIDER_SITE_OTHER): Admitting: Internal Medicine

## 2024-03-13 VITALS — BP 120/70 | HR 55 | Ht 69.0 in | Wt 161.4 lb

## 2024-03-13 DIAGNOSIS — E1159 Type 2 diabetes mellitus with other circulatory complications: Secondary | ICD-10-CM | POA: Diagnosis not present

## 2024-03-13 DIAGNOSIS — Z794 Long term (current) use of insulin: Secondary | ICD-10-CM

## 2024-03-13 DIAGNOSIS — R634 Abnormal weight loss: Secondary | ICD-10-CM

## 2024-03-13 DIAGNOSIS — E785 Hyperlipidemia, unspecified: Secondary | ICD-10-CM | POA: Diagnosis not present

## 2024-03-13 DIAGNOSIS — E1165 Type 2 diabetes mellitus with hyperglycemia: Secondary | ICD-10-CM

## 2024-03-13 LAB — POCT GLYCOSYLATED HEMOGLOBIN (HGB A1C): Hemoglobin A1C: 6.9 % — AB (ref 4.0–5.6)

## 2024-03-13 NOTE — Patient Instructions (Addendum)
 Please continue: - Tresiba  4 units at bedtime  Decrease: - Novolog :  2 units before b'fast and lunch 1-2 units before dinner  Please return in 3-4 months.
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
## 2024-03-20 ENCOUNTER — Encounter: Payer: Self-pay | Admitting: Family

## 2024-03-20 ENCOUNTER — Ambulatory Visit: Admitting: Family

## 2024-03-20 ENCOUNTER — Other Ambulatory Visit (HOSPITAL_BASED_OUTPATIENT_CLINIC_OR_DEPARTMENT_OTHER): Payer: Self-pay

## 2024-03-20 VITALS — BP 124/71 | HR 60 | Temp 98.0°F | Resp 16 | Ht 69.0 in | Wt 161.0 lb

## 2024-03-20 DIAGNOSIS — E119 Type 2 diabetes mellitus without complications: Secondary | ICD-10-CM

## 2024-03-20 DIAGNOSIS — I4892 Unspecified atrial flutter: Secondary | ICD-10-CM

## 2024-03-20 DIAGNOSIS — K7460 Unspecified cirrhosis of liver: Secondary | ICD-10-CM

## 2024-03-20 DIAGNOSIS — Z794 Long term (current) use of insulin: Secondary | ICD-10-CM | POA: Diagnosis not present

## 2024-03-20 DIAGNOSIS — R634 Abnormal weight loss: Secondary | ICD-10-CM

## 2024-03-20 DIAGNOSIS — E7800 Pure hypercholesterolemia, unspecified: Secondary | ICD-10-CM | POA: Diagnosis not present

## 2024-03-20 DIAGNOSIS — I5032 Chronic diastolic (congestive) heart failure: Secondary | ICD-10-CM

## 2024-03-20 DIAGNOSIS — E118 Type 2 diabetes mellitus with unspecified complications: Secondary | ICD-10-CM

## 2024-03-20 DIAGNOSIS — K219 Gastro-esophageal reflux disease without esophagitis: Secondary | ICD-10-CM | POA: Diagnosis not present

## 2024-03-20 DIAGNOSIS — M810 Age-related osteoporosis without current pathological fracture: Secondary | ICD-10-CM

## 2024-03-20 DIAGNOSIS — I10 Essential (primary) hypertension: Secondary | ICD-10-CM | POA: Diagnosis not present

## 2024-03-20 MED ORDER — ATORVASTATIN CALCIUM 40 MG PO TABS
40.0000 mg | ORAL_TABLET | Freq: Every day | ORAL | 1 refills | Status: AC
Start: 2024-03-20 — End: ?
  Filled 2024-03-20: qty 90, 90d supply, fill #0

## 2024-03-20 MED ORDER — METOPROLOL SUCCINATE ER 25 MG PO TB24
25.0000 mg | ORAL_TABLET | Freq: Every day | ORAL | 1 refills | Status: DC
  Filled 2024-03-20: qty 90, 90d supply, fill #0

## 2024-03-20 NOTE — Assessment & Plan Note (Signed)
 No symptoms, not on medication.

## 2024-03-20 NOTE — Assessment & Plan Note (Signed)
 Lab Results  Component Value Date   CHOL 111 02/03/2024   HDL 43 02/03/2024   LDLCALC 52 02/03/2024   TRIG 79 02/03/2024   CHOLHDL 2.6 02/03/2024   At goal on lipitor 40mg .  Continue same.

## 2024-03-20 NOTE — Assessment & Plan Note (Signed)
 Euvolemic.  Not on diuretic.

## 2024-03-20 NOTE — Progress Notes (Signed)
 Subjective:     Patient ID: Ryan [name], male    DOB: 06/28/1946, 78 y.o.   MRN: [id number]  Chief Complaint  Patient presents with   Weight Loss    Here for follow up    HPI  Discussed the use of AI scribe software for clinical note transcription with the patient, who gave verbal consent to proceed.  History of Present Illness  Ryan [name] is a 78 year old male who presents for routine follow-up.  His total cholesterol is 111 mg/dL and LDL is 52 mg/dL. He is taking Lipitor 40 mg daily. His weight is stable at 161 pounds over the last three visits. His TSH level is 4.6, and he is not on medication for this. Blood pressure is 124/71 mmHg, and he is taking Toprol  XL. He is concerned about the potential removal of Eliquis  from the market, which he has been taking for atrial fibrillation. His last A1c was 6.9%, and he is on Tresiba  and Novolog  for diabetes management. He continues to follow with endocrinology.   He denies reflux symptoms.  For his IPF, he is under the care of a pulmonary specialist, with discussions about potential monthly injections.     Health Maintenance Due  Topic Date Due   DTaP/Tdap/Td (3 - Tdap) 08/24/2021   COVID-19 Vaccine (4 - 2024-25 season) 08/13/2023   Medicare Annual Wellness (AWV)  02/15/2024   OPHTHALMOLOGY EXAM  03/07/2024   INFLUENZA VACCINE  03/09/2024    Past Medical History:  Diagnosis Date   Allergy    Arthritis    Colitis 12/17/2022   Diabetes mellitus    GERD (gastroesophageal reflux disease)    History of chicken pox    Hyperlipidemia    Hypertension    IPF (idiopathic pulmonary fibrosis) (HCC)    Myocardial infarction (HCC) 2017   Osteoporosis    Type 2 diabetes mellitus with hyperglycemia, with long-term current use of insulin  (HCC) 09/08/2015    Past Surgical History:  Procedure Laterality Date   EYE SURGERY  1998   bilateral   SHOULDER SURGERY  1990   right shoulder, torn rotator cuff.    Family History  Problem  Relation Age of Onset   Cancer Mother        cancer   Heart disease Mother    Hyperlipidemia Mother    Hypertension Mother    Hyperlipidemia Father    Stroke Father    Hypertension Father    Diabetes Paternal Grandfather    Colon cancer Neg Hx    Stomach cancer Neg Hx    Esophageal cancer Neg Hx     Social History   Socioeconomic History   Marital status: Widowed    Spouse name: Not on file   Number of children: 1   Years of education: Not on file   Highest education level: Not on file  Occupational History   Occupation: retired    Comment: from retail  Tobacco Use   Smoking status: Former    Current packs/day: 0.00    Types: Cigarettes    Quit date: 09/01/1965    Years since quitting: 58.5   Smokeless tobacco: Never   Tobacco comments:    Smoked about 1cig a week for about 6 months.  Vaping Use   Vaping status: Never Used  Substance and Sexual Activity   Alcohol use: No   Drug use: No   Sexual activity: Never  Other Topics Concern   Not on file  Social  History Narrative   Regular exercise:  5 x weekly   Caffeine Use: 1 cups coffee daily.   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   Son, daughter in law and young adult granddaughter and grandson live with client   Wife died from ovarian cancer in 2015-04-18         Social Drivers of Health   Financial Resource Strain: Low Risk  (02/15/2023)   Overall Financial Resource Strain (CARDIA)    Difficulty of Paying Living Expenses: Not hard at all  Food Insecurity: No Food Insecurity (12/13/2022)   Hunger Vital Sign    Worried About Running Out of Food in the Last Year: Never true    Ran Out of Food in the Last Year: Never true  Transportation Needs: No Transportation Needs (12/13/2022)   PRAPARE - Administrator, Civil Service (Medical): No    Lack of Transportation (Non-Medical): No  Physical Activity: Insufficiently Active (02/15/2023)   Exercise Vital Sign    Days of Exercise per Week: 3 days    Minutes of Exercise  per Session: 30 min  Stress: No Stress Concern Present (02/15/2023)   [employer] of Occupational Health - Occupational Stress Questionnaire    Feeling of Stress : Not at all  Social Connections: Socially Isolated (02/15/2023)   Social Connection and Isolation Panel    Frequency of Communication with Friends and Family: Twice a week    Frequency of Social Gatherings with Friends and Family: Never    Attends Religious Services: Never    Database administrator or Organizations: No    Attends Banker Meetings: Never    Marital Status: Widowed  Intimate Partner Violence: Not At Risk (12/09/2022)   Humiliation, Afraid, Rape, and Kick questionnaire    Fear of Current or Ex-Partner: No    Emotionally Abused: No    Physically Abused: No    Sexually Abused: No    Outpatient Medications Prior to Visit  Medication Sig Dispense Refill   alendronate  (FOSAMAX ) 70 MG tablet Take 1 tablet (70 mg total) by mouth every 7 (seven) days. Take with a full glass of water on an empty stomach. 12 tablet 4   apixaban  (ELIQUIS ) 5 MG TABS tablet Take 1 tablet (5 mg total) by mouth 2 (two) times daily. 60 tablet 5   Calcium  Carbonate-Vitamin D 600-400 MG-UNIT tablet Take 1 tablet by mouth 2 (two) times daily. Reported on 01/27/2016     cholecalciferol (VITAMIN D3) 25 MCG (1000 UNIT) tablet Take 1,000 Units by mouth daily.     Continuous Glucose Sensor (DEXCOM G7 SENSOR) MISC Apply 1 sensor every 10 days. 9 each 4   insulin  aspart (NOVOLOG ) 100 UNIT/ML FlexPen Inject 2-3 Units into the skin 3 (three) times daily before meals. 15 mL 3   insulin  degludec (TRESIBA  FLEXTOUCH) 100 UNIT/ML FlexTouch Pen Inject 4-5 Units into the skin daily. 9 mL 3   Insulin  Syringe-Needle U-100 (TRUEPLUS INSULIN  SYRINGE) 31G X 5/16 0.5 ML MISC USE TO INJECT INSULIN  4 TIMES A DAY 300 each 3   atorvastatin  (LIPITOR) 40 MG tablet TAKE 1 TABLET (40 MG TOTAL) BY MOUTH DAILY. 90 tablet 1   metoprolol  succinate (TOPROL -XL) 25 MG 24  hr tablet TAKE 1 TABLET (25 MG TOTAL) BY MOUTH DAILY. 90 tablet 1   No facility-administered medications prior to visit.    Allergies  Allergen Reactions   Other Other (See Comments)    Client states he became very sick and had significant skin  inflammation and irritation with the Freestyle Libre sensor   Lisinopril      Cough     ROS See HPI    Objective:    Physical Exam Constitutional:      General: He is not in acute distress.    Appearance: He is well-developed.  HENT:     Head: Normocephalic and atraumatic.  Cardiovascular:     Rate and Rhythm: Normal rate and regular rhythm.     Heart sounds: No murmur heard. Pulmonary:     Effort: Pulmonary effort is normal. No respiratory distress.     Breath sounds: Normal breath sounds. No wheezing or rales.  Skin:    General: Skin is warm and dry.  Neurological:     Mental Status: He is alert and oriented to person, place, and time.  Psychiatric:        Behavior: Behavior normal.        Thought Content: Thought content normal.      BP 124/71 (BP Location: Left Arm, Patient Position: Sitting, Cuff Size: Small)   Pulse 60   Temp 98 F (36.7 C) (Oral)   Resp 16   Ht 5' 9 (1.753 m)   Wt 161 lb (73 kg)   SpO2 98%   BMI 23.78 kg/m  Wt Readings from Last 3 Encounters:  03/20/24 161 lb (73 kg)  03/13/24 161 lb 6.4 oz (73.2 kg)  02/29/24 161 lb (73 kg)       Assessment & Plan:   Problem List Items Addressed This Visit       Unprioritized   RESOLVED: Weight loss   Weight has stabilized.       Osteoporosis   Maintained on fosamax  and caltrate.      Hyperlipidemia - Primary   Lab Results  Component Value Date   CHOL 111 02/03/2024   HDL 43 02/03/2024   LDLCALC 52 02/03/2024   TRIG 79 02/03/2024   CHOLHDL 2.6 02/03/2024   At goal on lipitor 40mg .  Continue same.       Relevant Medications   metoprolol  succinate (TOPROL -XL) 25 MG 24 hr tablet   atorvastatin  (LIPITOR) 40 MG tablet   HTN  (hypertension)   BP stable with toprol  xl.       Relevant Medications   metoprolol  succinate (TOPROL -XL) 25 MG 24 hr tablet   atorvastatin  (LIPITOR) 40 MG tablet   RESOLVED: GERD (gastroesophageal reflux disease)   No symptoms, not on medication.       Controlled diabetes mellitus type 2 with complications [facility])   Lab Results  Component Value Date   HGBA1C 6.9 (A) 03/13/2024   HGBA1C 7.4 (A) 11/04/2023   HGBA1C 6.7 (A) 07/04/2023   Lab Results  Component Value Date   MICROALBUR 1.7 02/03/2024   LDLCALC 52 02/03/2024   CREATININE 0.98 02/03/2024   On Tresiba  and novolog . At goal- managed by Endo. Pt is advised to call Ophthalmology to schedule his annual DM eye exam.       Relevant Medications   atorvastatin  (LIPITOR) 40 MG tablet   Cirrhosis of liver without ascites (HCC)   Noted on CT. Saw GI back in 2023 and they felt that finding was clinically insignificant given mild changes on imaging and no hepatic synthetic  dysfunction.  Continue to monitor LFT's.        Chronic diastolic CHF (congestive heart failure) (HCC)   Euvolemic.  Not on diuretic.       Relevant Medications   metoprolol  succinate (TOPROL -XL)  25 MG 24 hr tablet   atorvastatin  (LIPITOR) 40 MG tablet   Atrial flutter (HCC)   Rate stable on toprol  xl. Continues Eliquis . Followed by cardiology- Dr. Vina Gull.       Relevant Medications   metoprolol  succinate (TOPROL -XL) 25 MG 24 hr tablet   atorvastatin  (LIPITOR) 40 MG tablet    I am having Ryan [name] maintain his Calcium  Carbonate-Vitamin D, cholecalciferol, Insulin  Syringe-Needle U-100, Dexcom G7 Sensor, alendronate , insulin  aspart, Tresiba  FlexTouch, apixaban , metoprolol  succinate, and atorvastatin .  Meds ordered this encounter  Medications   metoprolol  succinate (TOPROL -XL) 25 MG 24 hr tablet    Sig: Take 1 tablet (25 mg total) by mouth daily.    Dispense:  90 tablet    Refill:  1    Supervising Provider:   DOMENICA BLACKBIRD A [[id number]]    atorvastatin  (LIPITOR) 40 MG tablet    Sig: Take 1 tablet (40 mg total) by mouth daily.    Dispense:  90 tablet    Refill:  1    Supervising Provider:   DOMENICA BLACKBIRD A [[id number]]

## 2024-03-20 NOTE — Assessment & Plan Note (Signed)
 Weight has stabilized

## 2024-03-20 NOTE — Assessment & Plan Note (Addendum)
 Lab Results  Component Value Date   HGBA1C 6.9 (A) 03/13/2024   HGBA1C 7.4 (A) 11/04/2023   HGBA1C 6.7 (A) 07/04/2023   Lab Results  Component Value Date   MICROALBUR 1.7 02/03/2024   LDLCALC 52 02/03/2024   CREATININE 0.98 02/03/2024   On Tresiba  and novolog . At goal- managed by Endo. Pt is advised to call Ophthalmology to schedule his annual DM eye exam.

## 2024-03-20 NOTE — Assessment & Plan Note (Signed)
 BP stable with toprol  xl.

## 2024-03-20 NOTE — Patient Instructions (Signed)
 VISIT SUMMARY:  Today, we reviewed your overall health and current medications. Your diabetes, blood pressure, and cholesterol are well-controlled. We discussed your concerns about Eliquis  and refilled your bone medication.  YOUR PLAN:  TYPE 2 DIABETES MELLITUS: Your diabetes is well-controlled with an A1c of 6.9%. -Continue taking Tresiba  and Novolog  as prescribed.  ATRIAL FIBRILLATION: You are currently managing this condition with Eliquis . -Continue taking Eliquis  as prescribed.  HYPERTENSION: Your blood pressure is well-controlled at 124/71 mmHg. -Continue taking Toprol  XL as prescribed.  HYPERLIPIDEMIA: Your cholesterol levels are well-controlled with a total cholesterol of 111 mg/dL and LDL of 52 mg/dL. -Continue taking Lipitor 40 mg daily.  OSTEOPOROSIS: You are awaiting a refill for your bone medication fosamax .   SUBCLINICAL HYPOTHYROIDISM: Your TSH level is 4.6, which is within the normal range. -We will recheck your TSH level in 6 months.

## 2024-03-20 NOTE — Assessment & Plan Note (Signed)
 Maintained on fosamax  and caltrate.

## 2024-03-20 NOTE — Assessment & Plan Note (Addendum)
 Rate stable on toprol  xl. Continues Eliquis . Followed by cardiology- Dr. Vina Gull.

## 2024-03-20 NOTE — Assessment & Plan Note (Signed)
 Noted on CT. Saw GI back in 2023 and they felt that finding was clinically insignificant given mild changes on imaging and no hepatic synthetic  dysfunction.  Continue to monitor LFT's.

## 2024-03-23 ENCOUNTER — Other Ambulatory Visit (HOSPITAL_BASED_OUTPATIENT_CLINIC_OR_DEPARTMENT_OTHER): Payer: Self-pay

## 2024-04-03 ENCOUNTER — Other Ambulatory Visit (HOSPITAL_COMMUNITY): Payer: Self-pay

## 2024-04-04 ENCOUNTER — Other Ambulatory Visit (HOSPITAL_BASED_OUTPATIENT_CLINIC_OR_DEPARTMENT_OTHER): Payer: Self-pay

## 2024-04-05 ENCOUNTER — Other Ambulatory Visit (HOSPITAL_COMMUNITY): Payer: Self-pay

## 2024-04-10 ENCOUNTER — Ambulatory Visit (INDEPENDENT_AMBULATORY_CARE_PROVIDER_SITE_OTHER): Admitting: *Deleted

## 2024-04-10 VITALS — Ht 69.0 in | Wt 150.0 lb

## 2024-04-10 DIAGNOSIS — Z0000 Encounter for general adult medical examination without abnormal findings: Secondary | ICD-10-CM | POA: Diagnosis not present

## 2024-04-10 NOTE — Progress Notes (Signed)
 Subjective:   Ryan [name] is a 78 y.o. who presents for a Medicare Wellness preventive visit.  As a reminder, Annual Wellness Visits don't include a physical exam, and some assessments may be limited, especially if this visit is performed virtually. We may recommend an in-person follow-up visit with your provider if needed.  Visit Complete: Virtual I connected with  Ryan [name] on 04/10/24 by a audio enabled telemedicine application and verified that I am speaking with the correct person using two identifiers.  Patient Location: Home  Provider Location: Office/Clinic  I discussed the limitations of evaluation and management by telemedicine. The patient expressed understanding and agreed to proceed.  Vital Signs: Because this visit was a virtual/telehealth visit, some criteria may be missing or patient reported. Any vitals not documented were not able to be obtained and vitals that have been documented are patient reported.  VideoDeclined- This patient declined Librarian, academic. Therefore the visit was completed with audio only.  Persons Participating in Visit: Patient.  AWV Questionnaire: No: Patient Medicare AWV questionnaire was not completed prior to this visit.  Cardiac Risk Factors include: advanced age (>66men, >75 women);diabetes mellitus;dyslipidemia;hypertension;Other (see comment), Risk factor comments: ILD, CHF, atrial Flutter     Objective:    Today's Vitals   04/10/24 1348  Weight: 150 lb (68 kg)  Height: 5' 9 (1.753 m)   Body mass index is 22.15 kg/m.     04/10/2024    2:04 PM 02/15/2023    2:48 PM 12/09/2022    2:00 PM 12/07/2022    4:03 PM 02/08/2022    1:29 PM 02/02/2021    3:11 PM 01/28/2020   10:19 AM  Advanced Directives  Does Patient Have a Medical Advance Directive? No Yes No No Yes Yes No  Type of Special educational needs teacher of [place];Living will   Healthcare Power of [employer] of [place];Living  will   Does patient want to make changes to medical advance directive?  No - Patient declined       Copy of Healthcare Power of Attorney in Chart?  No - copy requested   No - copy requested No - copy requested   Would patient like information on creating a medical advance directive? Yes (MAU/Ambulatory/Procedural Areas - Information given)  No - Patient declined No - Patient declined   No - Patient declined    Current Medications (verified) Outpatient Encounter Medications as of 04/10/2024  Medication Sig   alendronate  (FOSAMAX ) 70 MG tablet Take 1 tablet (70 mg total) by mouth every 7 (seven) days. Take with a full glass of water on an empty stomach.   apixaban  (ELIQUIS ) 5 MG TABS tablet Take 1 tablet (5 mg total) by mouth 2 (two) times daily.   atorvastatin  (LIPITOR) 40 MG tablet Take 1 tablet (40 mg total) by mouth daily.   Calcium  Carbonate-Vitamin D 600-400 MG-UNIT tablet Take 1 tablet by mouth 2 (two) times daily. Reported on 01/27/2016   cholecalciferol (VITAMIN D3) 25 MCG (1000 UNIT) tablet Take 1,000 Units by mouth daily.   Continuous Glucose Sensor (DEXCOM G7 SENSOR) MISC Apply 1 sensor every 10 days.   insulin  aspart (NOVOLOG ) 100 UNIT/ML FlexPen Inject 2-3 Units into the skin 3 (three) times daily before meals.   insulin  degludec (TRESIBA  FLEXTOUCH) 100 UNIT/ML FlexTouch Pen Inject 4-5 Units into the skin daily.   Insulin  Syringe-Needle U-100 (TRUEPLUS INSULIN  SYRINGE) 31G X 5/16 0.5 ML MISC USE TO INJECT INSULIN  4 TIMES A  DAY   metoprolol  succinate (TOPROL -XL) 25 MG 24 hr tablet Take 1 tablet (25 mg total) by mouth daily.   No facility-administered encounter medications on file as of 04/10/2024.    Allergies (verified) Other and Lisinopril    History: Past Medical History:  Diagnosis Date   Allergy    Arthritis    Colitis 12/17/2022   Diabetes mellitus    GERD (gastroesophageal reflux disease)    History of chicken pox    Hyperlipidemia    Hypertension    IPF (idiopathic  pulmonary fibrosis) (HCC)    Myocardial infarction (HCC) 05/16/16   Osteoporosis    Type 2 diabetes mellitus with hyperglycemia, with long-term current use of insulin  (HCC) 09/08/2015   Past Surgical History:  Procedure Laterality Date   EYE SURGERY  1998   bilateral   SHOULDER SURGERY  1990   right shoulder, torn rotator cuff.   Family History  Problem Relation Age of Onset   Cancer Mother        cancer   Heart disease Mother    Hyperlipidemia Mother    Hypertension Mother    Hyperlipidemia Father    Stroke Father    Hypertension Father    Diabetes Paternal Grandfather    Colon cancer Neg Hx    Stomach cancer Neg Hx    Esophageal cancer Neg Hx    Social History   Socioeconomic History   Marital status: Widowed    Spouse name: Not on file   Number of children: 1   Years of education: Not on file   Highest education level: Not on file  Occupational History   Occupation: retired    Comment: from retail  Tobacco Use   Smoking status: Former    Current packs/day: 0.00    Types: Cigarettes    Quit date: 09/01/1965    Years since quitting: 58.6   Smokeless tobacco: Never   Tobacco comments:    Smoked about 1cig a week for about 6 months.  Vaping Use   Vaping status: Never Used  Substance and Sexual Activity   Alcohol use: No   Drug use: No   Sexual activity: Never  Other Topics Concern   Not on file  Social History Narrative   Regular exercise:  5 x weekly   Caffeine Use: 1 cups coffee daily.   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   Son, daughter in law and young adult granddaughter and grandson live with client   Wife died from ovarian cancer in 05/17/2015         Social Drivers of Health   Financial Resource Strain: Low Risk  (04/10/2024)   Overall Financial Resource Strain (CARDIA)    Difficulty of Paying Living Expenses: Not very hard  Food Insecurity: No Food Insecurity (04/10/2024)   Hunger Vital Sign    Worried About Running Out of Food in the Last Year: Never true     Ran Out of Food in the Last Year: Never true  Transportation Needs: No Transportation Needs (04/10/2024)   PRAPARE - Administrator, Civil Service (Medical): No    Lack of Transportation (Non-Medical): No  Physical Activity: Insufficiently Active (04/10/2024)   Exercise Vital Sign    Days of Exercise per Week: 3 days    Minutes of Exercise per Session: 30 min  Stress: No Stress Concern Present (04/10/2024)   [employer] of Occupational Health - Occupational Stress Questionnaire    Feeling of Stress: Not at all  Social Connections: Socially  Isolated (04/10/2024)   Social Connection and Isolation Panel    Frequency of Communication with Friends and Family: More than three times a week    Frequency of Social Gatherings with Friends and Family: More than three times a week    Attends Religious Services: Never    Database administrator or Organizations: No    Attends Banker Meetings: Never    Marital Status: Widowed    Tobacco Counseling Counseling given: Not Answered Tobacco comments: Smoked about 1cig a week for about 6 months.    Clinical Intake:  Pre-visit preparation completed: No  Pain : No/denies pain     BMI - recorded: 22.15 Nutritional Risks: None Diabetes: Yes CBG done?: No Did pt. bring in CBG monitor from home?: No  Lab Results  Component Value Date   HGBA1C 6.9 (A) 03/13/2024   HGBA1C 7.4 (A) 11/04/2023   HGBA1C 6.7 (A) 07/04/2023     How often do you need to have someone help you when you read instructions, pamphlets, or other written materials from your doctor or pharmacy?: 1 - Never  Interpreter Needed?: No  Information entered by :: Lolita Libra, CMA   Activities of Daily Living     04/10/2024    1:53 PM  In your present state of health, do you have any difficulty performing the following activities:  Hearing? 0  Vision? 0  Difficulty concentrating or making decisions? 0  Walking or climbing stairs? 0  Dressing  or bathing? 0  Doing errands, shopping? 0  Preparing Food and eating ? N  Using the Toilet? N  In the past six months, have you accidently leaked urine? N  Do you have problems with loss of bowel control? N  Managing your Medications? N  Managing your Finances? N  Housekeeping or managing your Housekeeping? N    Patient Care Team: Daryl Setter, NP as PCP - General (Internal Medicine) Okey Vina GAILS, MD as PCP - Cardiology (Cardiology) Trixie File, MD as Consulting Physician (Endocrinology) Okey Vina GAILS, MD as Consulting Physician (Cardiology) Carla Milling, RPH-CPP (Pharmacist) Geronimo Amel, MD as Consulting Physician (Pulmonary Disease) Geronimo Amel, MD as Consulting Physician (Pulmonary Disease) [facility]  I have updated your Care Teams any recent Medical Services you may have received from other providers in the past year.     Assessment:   This is a routine wellness examination for Ryan [name].  Hearing/Vision screen Hearing Screening - Comments:: Denies hearing difficulties.  Vision Screening - Comments:: Wears RX glasses -- up to date with routine eye exams at [facility], currently moving to [place].     Goals Addressed   None    Depression Screen     04/10/2024    2:03 PM 03/20/2024   10:24 AM 02/15/2023    2:48 PM 02/08/2022    1:31 PM 02/02/2021    3:15 PM 01/23/2021   10:06 AM 01/28/2020   10:26 AM  PHQ 2/9 Scores  PHQ - 2 Score 0 0 0 0 0 0 0  PHQ- 9 Score 0          Fall Risk     04/10/2024    2:04 PM 03/20/2024   10:24 AM 02/15/2023    2:43 PM 02/08/2022    1:30 PM 02/02/2021    3:14 PM  Fall Risk   Falls in the past year? 0 0 1 0 0  Comment   blood sugar bottomed out to 40  Number falls in past yr: 0 0 0 0 0  Injury with Fall? 0 0 0 0 0  Risk for fall due to : No Fall Risks No Fall Risks Other (Comment)    Follow up Education provided Falls evaluation completed Falls evaluation completed Falls prevention discussed   Falls prevention discussed      Data saved with a previous flowsheet row definition    MEDICARE RISK AT HOME:  Medicare Risk at Home Any stairs in or around the home?: Yes If so, are there any without handrails?: No Home free of loose throw rugs in walkways, pet beds, electrical cords, etc?: Yes Adequate lighting in your home to reduce risk of falls?: Yes Life alert?: No Use of a cane, walker or w/c?: No Grab bars in the bathroom?: Yes Shower chair or bench in shower?: Yes Elevated toilet seat or a handicapped toilet?: No  TIMED UP AND GO:  Was the test performed?  No, audio  Cognitive Function: 6CIT completed    11/29/2017    9:18 AM 11/22/2016    9:33 AM  MMSE - Mini Mental State Exam  Orientation to time 5 5   Orientation to Place 5 5   Registration 3 3   Attention/ Calculation 5 5   Recall 3 2   Language- name 2 objects 2 2   Language- repeat 1 1  Language- follow 3 step command 3 3   Language- read & follow direction 1 1   Write a sentence 1 1   Copy design 1 0   Total score 30 28      Data saved with a previous flowsheet row definition        04/10/2024    2:04 PM 02/15/2023    2:51 PM  6CIT Screen  What Year? 0 points 0 points  What month? 0 points 0 points  What time? 0 points 0 points  Count back from 20 0 points 0 points  Months in reverse 0 points 0 points  Repeat phrase 0 points 0 points  Total Score 0 points 0 points    Immunizations Immunization History  Administered Date(s) Administered   Covid-19 Iv Non-us  Vaccine (Bibp, Sinopharm) 06/18/2023   Fluad Quad(high Dose 65+) 05/15/2019, 04/25/2020, 06/24/2021, 05/28/2022, 06/18/2023   H1N1 11/19/2008   Hepb-cpg 12/29/2021, 03/02/2022   INFLUENZA, HIGH DOSE SEASONAL PF 04/20/2016, 05/12/2017, 05/26/2018   Influenza Split 05/25/2011, 04/20/2012   Influenza Whole 06/06/2009   Influenza,inj,Quad PF,6+ Mos 05/07/2013, 05/27/2014, 05/13/2015   Influenza-Unspecified 05/09/2004, 06/09/2005,  06/15/2006, 07/11/2007, 05/15/2008, 06/06/2009   PFIZER(Purple Top)SARS-COV-2 Vaccination 09/30/2019, 10/24/2019, 07/22/2020   Pneumococcal Conjugate-13 10/19/2004, 05/11/2013   Pneumococcal Polysaccharide-23 05/25/2011, 09/08/2018   RSV,unspecified 09/19/2023   Td 08/09/2001, 08/25/2011   Zoster Recombinant(Shingrix ) 01/22/2021, 05/20/2021   Zoster, Live 08/25/2011    Screening Tests Health Maintenance  Topic Date Due   DTaP/Tdap/Td (3 - Tdap) 08/24/2021   Medicare Annual Wellness (AWV)  02/15/2024   OPHTHALMOLOGY EXAM  03/07/2024   INFLUENZA VACCINE  03/09/2024   COVID-19 Vaccine (5 - 2025-26 season) 04/09/2024   HEMOGLOBIN A1C  09/13/2024   Diabetic kidney evaluation - eGFR measurement  02/02/2025   Diabetic kidney evaluation - Urine ACR  02/02/2025   FOOT EXAM  03/13/2025   Pneumococcal Vaccine: 50+ Years  Completed   Hepatitis C Screening  Completed   Zoster Vaccines- Shingrix   Completed   HPV VACCINES  Aged Out   Meningococcal B Vaccine  Aged Out   Hepatitis B Vaccines 19-59 Average  Risk  Discontinued   Fecal DNA (Cologuard)  Discontinued    Health Maintenance  Health Maintenance Due  Topic Date Due   DTaP/Tdap/Td (3 - Tdap) 08/24/2021   Medicare Annual Wellness (AWV)  02/15/2024   OPHTHALMOLOGY EXAM  03/07/2024   INFLUENZA VACCINE  03/09/2024   COVID-19 Vaccine (5 - 2025-26 season) 04/09/2024   Health Maintenance Items Addressed: Will get flu and tetanus booster at pharmacy. Will schedule eye exam once he moves to [place] at the end of this month.  Additional Screening:  Vision Screening: Recommended annual ophthalmology exams for early detection of glaucoma and other disorders of the eye. Would you like a referral to an eye doctor? No    Dental Screening: Recommended annual dental exams for proper oral hygiene  Community Resource Referral / Chronic Care Management: CRR required this visit?  No   CCM required this visit?  No   Plan:    I have  personally reviewed and noted the following in the patient's chart:   Medical and social history Use of alcohol, tobacco or illicit drugs  Current medications and supplements including opioid prescriptions. Patient is not currently taking opioid prescriptions. Functional ability and status Nutritional status Physical activity Advanced directives List of other physicians Hospitalizations, surgeries, and ER visits in previous 12 months Vitals Screenings to include cognitive, depression, and falls Referrals and appointments  In addition, I have reviewed and discussed with patient certain preventive protocols, quality metrics, and best practice recommendations. A written personalized care plan for preventive services as well as general preventive health recommendations were provided to patient.   Lolita Libra, CMA   04/10/2024   After Visit Summary: (MyChart) Due to this being a telephonic visit, the after visit summary with patients personalized plan was offered to patient via MyChart   Notes: Nothing significant to report at this time.

## 2024-04-10 NOTE — Patient Instructions (Addendum)
 [name] , Thank you for taking time out of your busy schedule to complete your Annual Wellness Visit with me. I enjoyed our conversation and look forward to speaking with you again next year. I, as well as your care team,  appreciate your ongoing commitment to your health goals. Please review the following plan we discussed and let me know if I can assist you in the future. Your Game plan/ To Do List   **call for an appointment if your symptoms worsen or don't improve** Hope you feel better soon!  Referrals: If you haven't heard from the office you've been referred to, please reach out to them at the phone provided.   Eye exam:  don't forget to schedule this after your move.  Follow up Visits: Next Medicare AWV with our clinical staff:  04/11/25 1:40pm  Next Office Visit with your provider: 09/21/14  11am, Eleanor Ponto, NP  Clinician Recommendations:  Aim for 30 minutes of exercise or brisk walking, 6-8 glasses of water, and 5 servings of fruits and vegetables each day.   You will need to get the following vaccines at your local pharmacy:  Flu, tetanus      This is a list of the screening recommended for you and due dates:  Health Maintenance  Topic Date Due   DTaP/Tdap/Td vaccine (3 - Tdap) 08/24/2021   Medicare Annual Wellness Visit  02/15/2024   Eye exam for diabetics  03/07/2024   Flu Shot  03/09/2024   COVID-19 Vaccine (4 - 2025-26 season) 04/09/2024   Hemoglobin A1C  09/13/2024   Yearly kidney function blood test for diabetes  02/02/2025   Yearly kidney health urinalysis for diabetes  02/02/2025   Complete foot exam   03/13/2025   Pneumococcal Vaccine for age over 71  Completed   Hepatitis C Screening  Completed   Zoster (Shingles) Vaccine  Completed   HPV Vaccine  Aged Out   Meningitis B Vaccine  Aged Out   Hepatitis B Vaccine  Discontinued   Cologuard (Stool DNA test)  Discontinued    Advanced directives: (Provided) Advance directive discussed with you today. I  have provided a copy for you to complete at home and have notarized. Once this is complete, please bring a copy in to our office so we can scan it into your chart.  Advance Care Planning is important because it:  [x]  Makes sure you receive the medical care that is consistent with your values, goals, and preferences  [x]  It provides guidance to your family and loved ones and reduces their decisional burden about whether or not they are making the right decisions based on your wishes.  Follow the link provided in your after visit summary or read over the paperwork we have mailed to you to help you started getting your Advance Directives in place. If you need assistance in completing these, please reach out to us  so that we can help you!  See attachments for Preventive Care and Fall Prevention Tips.

## 2024-04-12 ENCOUNTER — Other Ambulatory Visit

## 2024-04-19 ENCOUNTER — Other Ambulatory Visit (HOSPITAL_BASED_OUTPATIENT_CLINIC_OR_DEPARTMENT_OTHER): Payer: Self-pay

## 2024-04-23 ENCOUNTER — Encounter

## 2024-05-11 ENCOUNTER — Other Ambulatory Visit (HOSPITAL_BASED_OUTPATIENT_CLINIC_OR_DEPARTMENT_OTHER): Payer: Self-pay

## 2024-05-15 ENCOUNTER — Other Ambulatory Visit: Payer: Self-pay | Admitting: Internal Medicine

## 2024-05-15 ENCOUNTER — Other Ambulatory Visit: Payer: Self-pay

## 2024-05-15 ENCOUNTER — Other Ambulatory Visit (HOSPITAL_BASED_OUTPATIENT_CLINIC_OR_DEPARTMENT_OTHER): Payer: Self-pay

## 2024-05-15 MED ORDER — DEXCOM G7 SENSOR MISC
3.0000 | 4 refills | Status: AC
  Filled 2024-05-15: qty 9, 90d supply, fill #0
  Filled 2024-08-14: qty 9, 90d supply, fill #1

## 2024-05-30 ENCOUNTER — Telehealth: Payer: Self-pay

## 2024-05-30 NOTE — Telephone Encounter (Signed)
 Lvm for pt to call back for pt assistance

## 2024-06-06 ENCOUNTER — Telehealth: Payer: Self-pay

## 2024-06-06 NOTE — Telephone Encounter (Signed)
 Patient assistance meds /Levemir  x 1 box picked up by patient's son.

## 2024-06-07 ENCOUNTER — Other Ambulatory Visit (HOSPITAL_BASED_OUTPATIENT_CLINIC_OR_DEPARTMENT_OTHER): Payer: Self-pay

## 2024-06-18 ENCOUNTER — Other Ambulatory Visit: Payer: Self-pay | Admitting: Internal Medicine

## 2024-07-05 DIAGNOSIS — J84112 Idiopathic pulmonary fibrosis: Secondary | ICD-10-CM | POA: Diagnosis not present

## 2024-07-05 DIAGNOSIS — Z20822 Contact with and (suspected) exposure to covid-19: Secondary | ICD-10-CM | POA: Diagnosis not present

## 2024-07-05 DIAGNOSIS — R059 Cough, unspecified: Secondary | ICD-10-CM | POA: Diagnosis not present

## 2024-07-05 DIAGNOSIS — R918 Other nonspecific abnormal finding of lung field: Secondary | ICD-10-CM | POA: Diagnosis not present

## 2024-07-05 DIAGNOSIS — R531 Weakness: Secondary | ICD-10-CM | POA: Diagnosis not present

## 2024-07-05 DIAGNOSIS — I491 Atrial premature depolarization: Secondary | ICD-10-CM | POA: Diagnosis not present

## 2024-07-05 DIAGNOSIS — E109 Type 1 diabetes mellitus without complications: Secondary | ICD-10-CM | POA: Diagnosis not present

## 2024-07-05 DIAGNOSIS — R590 Localized enlarged lymph nodes: Secondary | ICD-10-CM | POA: Diagnosis not present

## 2024-07-05 DIAGNOSIS — E860 Dehydration: Secondary | ICD-10-CM | POA: Diagnosis not present

## 2024-07-05 DIAGNOSIS — J984 Other disorders of lung: Secondary | ICD-10-CM | POA: Diagnosis not present

## 2024-07-09 ENCOUNTER — Telehealth: Payer: Self-pay

## 2024-07-09 NOTE — Telephone Encounter (Signed)
 Initial Comment Caller states his dad has lost 40 lbs. and has been feeling bad in the last 3 weeks. He finally got him to go to ED and they ran tests but sent him home. He hardly eats and coughing up mucus and he can't barely stand and walk because he is so weak. He told the hospital that he would rather stay but they sent him home. His diabetes has been staying up in the 300s. His last blood sugar was 290. Call the [phone] Translation No Nurse Assessment Nurse: Dasie, RN, Diane Date/Time Titus Time): 07/06/2024 3:05:11 PM Confirm and document reason for call. If symptomatic, describe symptoms. ---Caller states he has lost 40 lbs over the last 3-4 weeks. He is not eating anything. He coughing up clear mucus- x 2 weeks. They did x rays and a CT scan and blood work and a complete work up and all was negative. They did give him some fluids. No pain. He has some weakness, but can stand and walk. No chest pain or sob. No appetite- only takes 3-4 bites and then feels full. Does the patient have any new or worsening symptoms? ---Yes Will a triage be completed? ---Yes Related visit to physician within the last 2 weeks? ---Yes Does the PT have any chronic conditions? (i.e. diabetes, asthma, this includes High risk factors for pregnancy, etc.) ---Yes List chronic conditions. ---DM- BS in the 300's fasting sometimes, HTN, IPF Is this a behavioral health or substance abuse call? ---No PLEASE NOTE: All timestamps contained within this report are represented as Eastern Standard Time. CONFIDENTIALTY NOTICE: This fax transmission is intended only for the addressee. It contains information that is legally privileged, confidential or otherwise protected from use or disclosure. If you are not the intended recipient, you are strictly prohibited from reviewing, disclosing, copying using or disseminating any of this information or taking any action in reliance on or regarding this information.  If you have received this fax in error, please notify us  immediately by telephone so that we can arrange for its return to us . Phone: [phone], Toll-Free: [phone], Fax: [phone] [email or web address] 10/08/1945 Page: 1 of2 CallId: [id number] Guidelines Guideline Title Affirmed Question Affirmed Notes Nurse Date/Time Titus Time) Weight Loss - Unintended SEVERE weight loss (e.g., BMI < 15; weight loss that is rapid; taking little or no food by mouth) Dasie, RN, Diane 07/06/2024 3:09:35 PM Disp. Time Titus Time) Disposition Final User 07/06/2024 3:11:39 PM See PCP within 24 Hours Yes Dasie, RN, Diane Final Disposition 07/06/2024 3:11:39 PM See PCP within 24 Hours Yes Dasie, RN, Diane Caller Disagree/Comply Disagree Caller Understands Yes PreDisposition Did not know what to do Care Advice Given Per Guideline SEE PCP WITHIN 24 HOURS: * IF OFFICE WILL BE CLOSED: You need to be seen within the next 24 hours. A clinic or an urgent care center is often a good source of care if your doctor's office is closed or you can't get an appointment. * You become worse CALL BACK IF: CARE ADVICE per Weight Loss - Unintended (Adult) guideline. EAT SMALL, FREQUENT MEALS AND SNACKS: * Eat small meals or snacks more often during the day. * Choose foods that taste good and don't upset your stomach. Comments User: Diane, Allen, RN Date/Time Titus Time): 07/06/2024 3:13:08 PM Pt. was just seen and worked up in ER. He is going to call the office Monday morning for an appt. Referrals REFERRED TO PCP OFFICE

## 2024-07-09 NOTE — Telephone Encounter (Signed)
 Appt scheduled tomorrow

## 2024-07-10 ENCOUNTER — Ambulatory Visit: Admitting: Family

## 2024-07-10 VITALS — BP 105/66 | HR 81 | Temp 98.1°F | Resp 16 | Ht 69.0 in | Wt 133.0 lb

## 2024-07-10 DIAGNOSIS — R634 Abnormal weight loss: Secondary | ICD-10-CM | POA: Diagnosis not present

## 2024-07-10 DIAGNOSIS — E039 Hypothyroidism, unspecified: Secondary | ICD-10-CM | POA: Diagnosis not present

## 2024-07-10 DIAGNOSIS — E118 Type 2 diabetes mellitus with unspecified complications: Secondary | ICD-10-CM

## 2024-07-10 DIAGNOSIS — R590 Localized enlarged lymph nodes: Secondary | ICD-10-CM | POA: Diagnosis not present

## 2024-07-10 NOTE — Assessment & Plan Note (Signed)
Continues fosamax.  

## 2024-07-10 NOTE — Patient Instructions (Signed)
  VISIT SUMMARY: During today's visit, we discussed your recent appetite loss and significant weight loss, which you have started to recover from. We also reviewed your history of interstitial lung disease and the results of your recent CT scan, which showed enlarged lymph nodes but no signs of cancer. Additionally, we talked about your low sodium levels, low blood pressure, and the challenges you face with chewing due to not having teeth. We also began evaluating your thyroid  function due to suspected hypothyroidism.  YOUR PLAN: -ABNORMAL WEIGHT LOSS: You have experienced significant weight loss, but there has been recent improvement. Your CT scan did not show any signs of cancer. You have slightly low blood pressure and sodium levels, and you have difficulty chewing due to not having teeth, which you manage by eating soft foods. We will recheck your weight and sodium levels in one month and consider an appetite stimulant if the weight loss continues.  -INTERSTITIAL LUNG DISEASE: Interstitial lung disease is a condition that causes scarring of the lungs, leading to breathing difficulties. Your recent CT scan confirmed this diagnosis, and you reported experiencing pain. We will repeat the chest CT scan in three months to monitor your condition.  -ENLARGED MEDIASTINAL LYMPH NODES (LIKELY REACTIVE): The CT scan showed enlarged lymph nodes in your chest, which are likely reactive due to your interstitial lung disease and not a sign of cancer. We will repeat the chest CT scan in three months to keep an eye on this.  -SUSPECTED HYPOTHYROIDISM (UNDER EVALUATION): Hypothyroidism is a condition where your thyroid  gland does not produce enough hormones, which can affect your metabolism. Your thyroid  function tests suggest mild hypothyroidism, and we have ordered additional tests to further evaluate this. If the initial tests are normal, we will repeat the thyroid  studies in a few months.  INSTRUCTIONS: Please  return in one month for a follow-up to recheck your weight and sodium levels. We will also repeat your chest CT scan in three months to monitor your interstitial lung disease and the enlarged lymph nodes. Additionally, we will conduct further thyroid  function tests to evaluate for hypothyroidism. If you continue to experience weight loss, we may consider prescribing an appetite stimulant.

## 2024-07-10 NOTE — Assessment & Plan Note (Signed)
 Noted on CT.  Will plan to repeat CT scan in 3 months to ensure stability.

## 2024-07-10 NOTE — Assessment & Plan Note (Signed)
 Significant weight loss with recent improvement. CT scan negative for malignancy. Slightly low blood pressure and sodium. 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 - Recheck weight in one month. - Consider appetite stimulant if weight loss persists. - Recheck sodium levels in one month.

## 2024-07-10 NOTE — Progress Notes (Signed)
 Subjective:     Patient ID: Ryan [name], male    DOB: March 30, 1946, 78 y.o.   MRN: [id number]  Chief Complaint  Patient presents with   Hospitalization Follow-up    HPI  Discussed the use of AI scribe software for clinical note transcription with the patient, who gave verbal consent to proceed.  History of Present Illness   Ryan [name] is a 78 year old male with interstitial lung disease who presents with appetite loss and weight loss. He was seen at the ED at [facility] on 11/27 with negative evaluation.   He has experienced a significant loss of appetite, feeling full after only a few bites of food. This issue has persisted for an unspecified duration, but there is some improvement as he is now able to eat three meals a day. No nausea or depression, and no new stressors in his life.  He has experienced significant weight loss, dropping from his usual weight of 165 lbs to as low as 123 lbs. He attributes some of this weight loss to a period of illness with COVID-19 in August, which left him feeling weak and took until September to regain some strength. He has since started to gain weight back as his appetite improves.  In August, he was down for a month with COVID-19, which he believes contributed to his weight loss and general weakness. He reports difficulty staying healthy and is easily affected by illnesses when in proximity to sick individuals.  He has a history of interstitial lung disease and recently underwent a CT scan of the chest, abdomen, and pelvis which showed no sign of malignancy but did note changes consistent with his know interstitial lung disease. Nonspecific borderline mediastinal and hilar lymphadenopathy were noted with recommendation for short-term imaging follow-up to assess stability.    He mentions a recent move to [place], which he managed largely on his own, contributing to physical exertion. He lives with an 'old man and two handicapped people' and did not  hire professional movers due to cost concerns.  He takes vitamin D supplements at a dose of 5000 IU. Lab work at the ED noted a low sodium level.  He has no teeth and does not wear dentures, which affects his ability to chew certain foods. He manages by consuming softer foods like soup and plans to have beef stew for dinner.      Health Maintenance Due  Topic Date Due   DTaP/Tdap/Td (3 - Tdap) 08/24/2021   OPHTHALMOLOGY EXAM  03/07/2024   COVID-19 Vaccine (5 - 2025-26 season) 04/09/2024    Past Medical History:  Diagnosis Date   Allergy    Arthritis    Colitis 12/17/2022   Diabetes mellitus    GERD (gastroesophageal reflux disease)    History of chicken pox    Hyperlipidemia    Hypertension    IPF (idiopathic pulmonary fibrosis) (HCC)    Myocardial infarction (HCC) 2017   Osteoporosis    Type 2 diabetes mellitus with hyperglycemia, with long-term current use of insulin  (HCC) 09/08/2015    Past Surgical History:  Procedure Laterality Date   EYE SURGERY  1998   bilateral   SHOULDER SURGERY  1990   right shoulder, torn rotator cuff.    Family History  Problem Relation Age of Onset   Cancer Mother        cancer   Heart disease Mother    Hyperlipidemia Mother    Hypertension Mother    Hyperlipidemia Father  Stroke Father    Hypertension Father    Diabetes Paternal Grandfather    Colon cancer Neg Hx    Stomach cancer Neg Hx    Esophageal cancer Neg Hx     Social History   Socioeconomic History   Marital status: Widowed    Spouse name: Not on file   Number of children: 1   Years of education: Not on file   Highest education level: Not on file  Occupational History   Occupation: retired    Comment: from retail  Tobacco Use   Smoking status: Former    Current packs/day: 0.00    Types: Cigarettes    Quit date: 09/01/1965    Years since quitting: 58.8   Smokeless tobacco: Never   Tobacco comments:    Smoked about 1cig a week for about 6 months.   Vaping Use   Vaping status: Never Used  Substance and Sexual Activity   Alcohol use: No   Drug use: No   Sexual activity: Never  Other Topics Concern   Not on file  Social History Narrative   Regular exercise:  5 x weekly   Caffeine Use: 1 cups coffee daily.   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   Son, daughter in law and young adult granddaughter and grandson live with client   Wife died from ovarian cancer in 2015/08/09         Social Drivers of Health   Financial Resource Strain: Low Risk  (04/10/2024)   Overall Financial Resource Strain (CARDIA)    Difficulty of Paying Living Expenses: Not very hard  Food Insecurity: No Food Insecurity (04/10/2024)   Hunger Vital Sign    Worried About Running Out of Food in the Last Year: Never true    Ran Out of Food in the Last Year: Never true  Transportation Needs: No Transportation Needs (04/10/2024)   PRAPARE - Administrator, Civil Service (Medical): No    Lack of Transportation (Non-Medical): No  Physical Activity: Insufficiently Active (04/10/2024)   Exercise Vital Sign    Days of Exercise per Week: 3 days    Minutes of Exercise per Session: 30 min  Stress: No Stress Concern Present (04/10/2024)   [employer] of Occupational Health - Occupational Stress Questionnaire    Feeling of Stress: Not at all  Social Connections: Socially Isolated (04/10/2024)   Social Connection and Isolation Panel    Frequency of Communication with Friends and Family: More than three times a week    Frequency of Social Gatherings with Friends and Family: More than three times a week    Attends Religious Services: Never    Database Administrator or Organizations: No    Attends Banker Meetings: Never    Marital Status: Widowed  Intimate Partner Violence: Not At Risk (04/10/2024)   Humiliation, Afraid, Rape, and Kick questionnaire    Fear of Current or Ex-Partner: No    Emotionally Abused: No    Physically Abused: No    Sexually Abused: No     Outpatient Medications Prior to Visit  Medication Sig Dispense Refill   alendronate  (FOSAMAX ) 70 MG tablet Take 1 tablet (70 mg total) by mouth every 7 (seven) days. Take with a full glass of water on an empty stomach. 12 tablet 4   apixaban  (ELIQUIS ) 5 MG TABS tablet Take 1 tablet (5 mg total) by mouth 2 (two) times daily. 60 tablet 5   atorvastatin  (LIPITOR) 40 MG tablet Take 1 tablet (  40 mg total) by mouth daily. 90 tablet 1   Calcium  Carbonate-Vitamin D 600-400 MG-UNIT tablet Take 1 tablet by mouth 2 (two) times daily. Reported on 01/27/2016     cholecalciferol (VITAMIN D3) 25 MCG (1000 UNIT) tablet Take 1,000 Units by mouth daily.     Continuous Glucose Sensor (DEXCOM G7 SENSOR) MISC Apply 1 sensor every 10 days. 9 each 4   insulin  aspart (NOVOLOG ) 100 UNIT/ML FlexPen Inject 2-3 Units into the skin 3 (three) times daily before meals. 15 mL 3   insulin  degludec (TRESIBA  FLEXTOUCH) 100 UNIT/ML FlexTouch Pen Inject 4-5 Units into the skin daily. 9 mL 3   Insulin  Syringe-Needle U-100 (TRUEPLUS INSULIN  SYRINGE) 31G X 5/16 0.5 ML MISC USE TO INJECT INSULIN  4 TIMES A DAY 300 each 3   metoprolol  succinate (TOPROL -XL) 25 MG 24 hr tablet Take 1 tablet (25 mg total) by mouth daily. 90 tablet 1   No facility-administered medications prior to visit.    Allergies  Allergen Reactions   Other Other (See Comments)    Client states he became very sick and had significant skin inflammation and irritation with the Freestyle Libre sensor   Lisinopril      Cough     ROS    See HPI Objective:    Physical Exam Constitutional:      General: He is not in acute distress.    Appearance: He is well-developed.  HENT:     Head: Normocephalic and atraumatic.     Comments: Poor dentition with multiple missing teeth Cardiovascular:     Rate and Rhythm: Normal rate and regular rhythm.     Heart sounds: No murmur heard. Pulmonary:     Effort: Pulmonary effort is normal. No respiratory distress.      Breath sounds: Normal breath sounds. No wheezing or rales.  Skin:    General: Skin is warm and dry.  Neurological:     Mental Status: He is alert and oriented to person, place, and time.  Psychiatric:        Behavior: Behavior normal.        Thought Content: Thought content normal.      BP 105/66 (BP Location: Right Arm, Patient Position: Sitting, Cuff Size: Small)   Pulse 81   Temp 98.1 F (36.7 C) (Oral)   Resp 16   Ht 5' 9 (1.753 m)   Wt 133 lb (60.3 kg)   SpO2 95%   BMI 19.64 kg/m  Wt Readings from Last 3 Encounters:  07/10/24 133 lb (60.3 kg)  04/10/24 150 lb (68 kg)  03/20/24 161 lb (73 kg)       Assessment & Plan:   Problem List Items Addressed This Visit       Unprioritized   Weight loss   Significant weight loss with recent improvement. CT scan negative for malignancy. Slightly low blood pressure and sodium. 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 - Recheck weight in one month. - Consider appetite stimulant if weight loss persists. - Recheck sodium levels in one month.      LAD (lymphadenopathy), mediastinal   Noted on CT.  Will plan to repeat CT scan in 3 months to ensure stability.       Hypothyroidism - Primary   TSH was elevated mildly in the ED.  Update TFT's.      Relevant Orders   TSH   T3, free   T4, free   Controlled diabetes mellitus with complication (HCC)   Followed  by Endocrinology- last A1C at goal.   Complication is hx of CAD.      Relevant Orders   Comp Met (CMET)   Urine Microalbumin w/creat. ratio   Assessment & Plan    I am having Leiam L. Halberg maintain his Calcium  Carbonate-Vitamin D, cholecalciferol, Insulin  Syringe-Needle U-100, alendronate , insulin  aspart, Tresiba  FlexTouch, apixaban , metoprolol  succinate, atorvastatin , and Dexcom G7 Sensor.  No orders of the defined types were placed in this encounter.

## 2024-07-10 NOTE — Assessment & Plan Note (Signed)
 TSH was elevated mildly in the ED.  Update TFT's.

## 2024-07-10 NOTE — Assessment & Plan Note (Addendum)
 Followed by Endocrinology- last A1C at goal.   Complication is hx of CAD.

## 2024-07-11 LAB — T4, FREE: Free T4: 0.79 ng/dL (ref 0.60–1.60)

## 2024-07-11 LAB — COMPREHENSIVE METABOLIC PANEL WITH GFR
ALT: 11 U/L (ref 0–53)
AST: 16 U/L (ref 0–37)
Albumin: 3.7 g/dL (ref 3.5–5.2)
Alkaline Phosphatase: 82 U/L (ref 39–117)
BUN: 17 mg/dL (ref 6–23)
CO2: 27 meq/L (ref 19–32)
Calcium: 9.4 mg/dL (ref 8.4–10.5)
Chloride: 98 meq/L (ref 96–112)
Creatinine, Ser: 0.91 mg/dL (ref 0.40–1.50)
GFR: 80.92 mL/min (ref >60.00)
Glucose, Bld: 56 mg/dL — ABNORMAL LOW (ref 70–99)
Potassium: 4.2 meq/L (ref 3.5–5.1)
Sodium: 137 meq/L (ref 135–145)
Total Bilirubin: 1.5 mg/dL — ABNORMAL HIGH (ref 0.2–1.2)
Total Protein: 7.1 g/dL (ref 6.0–8.3)

## 2024-07-11 LAB — MICROALBUMIN / CREATININE URINE RATIO
Creatinine,U: 239.1 mg/dL
Microalb Creat Ratio: 8.9 mg/g (ref 0.0–30.0)
Microalb, Ur: 2.1 mg/dL — ABNORMAL HIGH (ref 0.0–1.9)

## 2024-07-11 LAB — TSH: TSH: 6.93 u[IU]/mL — ABNORMAL HIGH (ref 0.35–5.50)

## 2024-07-11 LAB — T3, FREE: T3, Free: 3 pg/mL (ref 2.3–4.2)

## 2024-07-12 ENCOUNTER — Ambulatory Visit: Payer: Self-pay | Admitting: Family

## 2024-07-12 DIAGNOSIS — I10 Essential (primary) hypertension: Secondary | ICD-10-CM

## 2024-07-12 DIAGNOSIS — R809 Proteinuria, unspecified: Secondary | ICD-10-CM

## 2024-07-12 DIAGNOSIS — E039 Hypothyroidism, unspecified: Secondary | ICD-10-CM

## 2024-07-12 NOTE — Telephone Encounter (Signed)
Tried calling Pt, no answer, unable to leave message.

## 2024-07-12 NOTE — Telephone Encounter (Signed)
 Can you please ask the lab to add on a free t3 and free t4 dx hypothyroid?

## 2024-07-13 ENCOUNTER — Ambulatory Visit: Admitting: Internal Medicine

## 2024-07-13 MED ORDER — LOSARTAN POTASSIUM 25 MG PO TABS: 12.5000 mg | ORAL_TABLET | Freq: Every day | ORAL | 1 refills | Status: AC

## 2024-07-13 NOTE — Telephone Encounter (Signed)
 Patient was notified of results, new medication, provider's comments and scheduled to come back 12-19 nv BP check and Labs

## 2024-07-13 NOTE — Telephone Encounter (Signed)
 Also, when you speak to patient, let him know that I would like to have him add 1/2 tab of losartan  once daily to help protect his kidneys as he has some protein in his urine.   Let's actually bring him back in 1-2 weeks for nurse visit to recheck blood pressure and he can do follow up lab work at that time.

## 2024-07-24 NOTE — Addendum Note (Signed)
 Addended by: ESTELLE GILLIS D on: 07/24/2024 09:40 AM   Modules accepted: Orders

## 2024-07-27 ENCOUNTER — Ambulatory Visit

## 2024-07-27 ENCOUNTER — Other Ambulatory Visit

## 2024-07-27 ENCOUNTER — Inpatient Hospital Stay: Admitting: Family

## 2024-07-27 DIAGNOSIS — E039 Hypothyroidism, unspecified: Secondary | ICD-10-CM | POA: Diagnosis not present

## 2024-07-27 DIAGNOSIS — I10 Essential (primary) hypertension: Secondary | ICD-10-CM

## 2024-07-27 NOTE — Progress Notes (Signed)
 Pt here for Blood pressure check per [name] [name],FNP: Let's actually bring him back in 1-2 weeks for nurse visit to recheck blood pressure   Pt currently takes: Losartan  25 MG                               Metoprolol  25 MG (pt reports he stopped taking once his started Losartan )   Pt reports compliance with medication.  BP today @ =130/84 HR =73  Pt advised per [name] [name],FNP start back taking the Metoprolol  25 MG and monitor bp at home and bring readings to follow-up appointment on 08/15/24.

## 2024-07-28 LAB — BASIC METABOLIC PANEL WITH GFR
BUN: 13 mg/dL (ref 7–25)
CO2: 29 mmol/L (ref 20–32)
Calcium: 9.2 mg/dL (ref 8.6–10.3)
Chloride: 98 mmol/L (ref 98–110)
Creat: 0.85 mg/dL (ref 0.70–1.28)
Glucose, Bld: 286 mg/dL — ABNORMAL HIGH (ref 65–99)
Potassium: 4.3 mmol/L (ref 3.5–5.3)
Sodium: 133 mmol/L — ABNORMAL LOW (ref 135–146)
eGFR: 89 mL/min/1.73m2

## 2024-07-28 LAB — T4, FREE: Free T4: 1 ng/dL (ref 0.8–1.8)

## 2024-07-28 LAB — T3, FREE: T3, Free: 3.6 pg/mL (ref 2.3–4.2)

## 2024-07-30 ENCOUNTER — Ambulatory Visit: Payer: Self-pay | Admitting: Family

## 2024-08-07 ENCOUNTER — Other Ambulatory Visit: Payer: Self-pay | Admitting: Internal Medicine

## 2024-08-07 DIAGNOSIS — I4892 Unspecified atrial flutter: Secondary | ICD-10-CM

## 2024-08-07 NOTE — Telephone Encounter (Signed)
 Prescription refill request for Eliquis  received. Indication: Last office visit:needs appt Scr: Age:  Weight:  Prescription refilled

## 2024-08-14 ENCOUNTER — Other Ambulatory Visit (HOSPITAL_COMMUNITY): Payer: Self-pay

## 2024-08-15 ENCOUNTER — Encounter: Payer: Self-pay | Admitting: Family

## 2024-08-15 ENCOUNTER — Ambulatory Visit (INDEPENDENT_AMBULATORY_CARE_PROVIDER_SITE_OTHER): Admitting: Family

## 2024-08-15 ENCOUNTER — Other Ambulatory Visit: Payer: Self-pay

## 2024-08-15 ENCOUNTER — Telehealth: Payer: Self-pay | Admitting: Family

## 2024-08-15 VITALS — BP 92/49 | HR 72 | Temp 97.8°F | Resp 16 | Ht 69.0 in | Wt 128.8 lb

## 2024-08-15 DIAGNOSIS — R634 Abnormal weight loss: Secondary | ICD-10-CM

## 2024-08-15 DIAGNOSIS — W19XXXA Unspecified fall, initial encounter: Secondary | ICD-10-CM

## 2024-08-15 DIAGNOSIS — E038 Other specified hypothyroidism: Secondary | ICD-10-CM | POA: Diagnosis not present

## 2024-08-15 DIAGNOSIS — J84112 Idiopathic pulmonary fibrosis: Secondary | ICD-10-CM | POA: Diagnosis not present

## 2024-08-15 DIAGNOSIS — E039 Hypothyroidism, unspecified: Secondary | ICD-10-CM | POA: Diagnosis not present

## 2024-08-15 DIAGNOSIS — K219 Gastro-esophageal reflux disease without esophagitis: Secondary | ICD-10-CM | POA: Diagnosis not present

## 2024-08-15 DIAGNOSIS — I10 Essential (primary) hypertension: Secondary | ICD-10-CM

## 2024-08-15 DIAGNOSIS — E118 Type 2 diabetes mellitus with unspecified complications: Secondary | ICD-10-CM | POA: Diagnosis not present

## 2024-08-15 MED ORDER — METOPROLOL SUCCINATE ER 25 MG PO TB24: 12.5000 mg | ORAL_TABLET | Freq: Every day | ORAL | Status: AC

## 2024-08-15 MED ORDER — MEGESTROL ACETATE 400 MG/10ML PO SUSP: 400.0000 mg | Freq: Every day | ORAL | 0 refills | Status: AC

## 2024-08-15 MED ORDER — OMEPRAZOLE 40 MG PO CPDR: 40.0000 mg | DELAYED_RELEASE_CAPSULE | Freq: Every day | ORAL | 1 refills | Status: AC

## 2024-08-15 NOTE — Telephone Encounter (Signed)
 Spoke with patient, appointment scheduled with Dr Abran 1/20226 @ 3 PM Patient confirmed date and time.

## 2024-08-15 NOTE — Assessment & Plan Note (Signed)
 CT chest/abd/pelvis noted some non-specific mediastinal adenopathy back in 11/25, but felt most likely related to ILD- will plan to repeat in 3 months.  Otherwise no concerning findings for underlying malignancy.     Significant weight loss with decreased appetite and low energy. Recent fall likely due to hypoglycemia. Previous CT showed interstitial lung disease. Mild subclinical hypothyroidism noted last visit. Will repeat TFT's today. Add trial of PPI to see if this helps with his early satiety given increased GERD symptoms.  Refer back to GI. Add trial of Megace  to stimulate appetite.  - Encouraged high-calorie soft snacks such as peanut butter sandwiches and peanut butter crackers (has poor dentition). - Advised increasing meal quantity as able. - Referred to GI specialist for evaluation of weight loss. - Repeated thyroid  function tests.

## 2024-08-15 NOTE — Assessment & Plan Note (Signed)
Uncontrolled.  Restart PPI.

## 2024-08-15 NOTE — Assessment & Plan Note (Signed)
 Update tft's.

## 2024-08-15 NOTE — Assessment & Plan Note (Signed)
 He is followed by pulmonology.

## 2024-08-15 NOTE — Assessment & Plan Note (Signed)
 Generalized weakness- likely due to poor po intake and weight loss.  He has some mild left hip tenderness but walking without difficulty and declines x-ray at this time. Encouraged use of cane to decrease fall risk.

## 2024-08-15 NOTE — Assessment & Plan Note (Signed)
 BP is low- likely due to recent weight loss. Will cut toprol  xl to 1/2 tab once daily.

## 2024-08-15 NOTE — Telephone Encounter (Signed)
 Could you please get this patient back in for follow up- he is having early satiety, weight loss (unexplained), no obvious cause on CT.   Wt Readings from Last 3 Encounters:  07/10/24 133 lb (60.3 kg)  04/10/24 150 lb (68 kg)  03/20/24 161 lb (73 kg)

## 2024-08-15 NOTE — Progress Notes (Signed)
 "  Subjective:     Patient ID: Ryan [name], male    DOB: 09-11-1945, 79 y.o.   MRN: [id number]  Chief Complaint  Patient presents with   Weight Loss    Was 133 lb on 07/10/24    HPI  Discussed the use of AI scribe software for clinical note transcription with the patient, who gave verbal consent to proceed.  History of Present Illness Ryan [name] is a 79 year old male with diabetes and interstitial lung disease who presents with weight loss and low energy.  He has experienced significant weight loss and low energy. He reports a decrease in appetite. He consumes three small meals daily but feels full quickly and sometimes coughs when trying to eat more. His weight is approximately 125 pounds in the morning, and he has noticed loose skin due to rapid weight loss. Occasionally, he consumes high-calorie foods like milkshakes and peanut butter but dislikes nutritional shakes.  He has a history of acid reflux, which has recently worsened. He mentions needing to restart medication for this condition and has previously taken omeprazole .  He reports low blood pressure, which he attributes to taking losartan  and metoprolol . He has been cutting the losartan  in half and uses a pill cutter for his medications.  He experienced a fall at 5 AM on a Monday morning, which he attributes to a rapid drop in blood sugar, noting it was in the fifties. He uses a Dexcom for monitoring his blood sugar levels, which currently reads 120. He did not hit his head during the fall but has a sore hip from the incident.  He reports a lack of energy, stating 'I don't have no energy.' He tries to help around the house but becomes exhausted after about ten minutes. He has had COVID three times, which he believes has contributed to his recent health struggles.  He has a history of interstitial lung disease. He has not seen his lung specialist recently and mentions difficulty taking medications due to their impact on his  diabetes.  He has not seen a gastrointestinal specialist in a while and mentions previous consultations where no significant symptoms were found. He drinks water and glycerin but dislikes nutritional shakes. No depression, but he feels upset due to lack of energy and inability to perform activities he used to enjoy, such as cooking.      Health Maintenance Due  Topic Date Due   DTaP/Tdap/Td (3 - Tdap) 08/24/2021   OPHTHALMOLOGY EXAM  03/07/2024   COVID-19 Vaccine (5 - 2025-26 season) 04/09/2024    Past Medical History:  Diagnosis Date   Allergy    Arthritis    Colitis 12/17/2022   Diabetes mellitus    DKA (diabetic ketoacidosis) (HCC) 12/09/2022   GERD (gastroesophageal reflux disease)    History of chicken pox    Hyperlipidemia    Hypertension    IPF (idiopathic pulmonary fibrosis) (HCC)    Myocardial infarction (HCC) 2017   Osteoporosis    Type 2 diabetes mellitus with hyperglycemia, with long-term current use of insulin  (HCC) 09/08/2015    Past Surgical History:  Procedure Laterality Date   EYE SURGERY  1998   bilateral   SHOULDER SURGERY  1990   right shoulder, torn rotator cuff.    Family History  Problem Relation Age of Onset   Cancer Mother        cancer   Heart disease Mother    Hyperlipidemia Mother    Hypertension Mother  Hyperlipidemia Father    Stroke Father    Hypertension Father    Diabetes Paternal Grandfather    Colon cancer Neg Hx    Stomach cancer Neg Hx    Esophageal cancer Neg Hx     Social History   Socioeconomic History   Marital status: Widowed    Spouse name: Not on file   Number of children: 1   Years of education: Not on file   Highest education level: Not on file  Occupational History   Occupation: retired    Comment: from retail  Tobacco Use   Smoking status: Former    Current packs/day: 0.00    Types: Cigarettes    Quit date: 09/01/1965    Years since quitting: 58.9   Smokeless tobacco: Never   Tobacco comments:     Smoked about 1cig a week for about 6 months.  Vaping Use   Vaping status: Never Used  Substance and Sexual Activity   Alcohol use: No   Drug use: No   Sexual activity: Never  Other Topics Concern   Not on file  Social History Narrative   Regular exercise:  5 x weekly   Caffeine Use: 1 cups coffee daily.   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   Son, daughter in law and young adult granddaughter and grandson live with client   Wife died from ovarian cancer in 08/25/2014         Social Drivers of Health   Tobacco Use: Medium Risk (07/27/2024)   Patient History    Smoking Tobacco Use: Former    Smokeless Tobacco Use: Never    Passive Exposure: Not on Actuary Strain: Low Risk (04/10/2024)   Overall Financial Resource Strain (CARDIA)    Difficulty of Paying Living Expenses: Not very hard  Food Insecurity: No Food Insecurity (04/10/2024)   Epic    Worried About Radiation Protection Practitioner of Food in the Last Year: Never true    Ran Out of Food in the Last Year: Never true  Transportation Needs: No Transportation Needs (04/10/2024)   Epic    Lack of Transportation (Medical): No    Lack of Transportation (Non-Medical): No  Physical Activity: Insufficiently Active (04/10/2024)   Exercise Vital Sign    Days of Exercise per Week: 3 days    Minutes of Exercise per Session: 30 min  Stress: No Stress Concern Present (04/10/2024)   [employer] of Occupational Health - Occupational Stress Questionnaire    Feeling of Stress: Not at all  Social Connections: Socially Isolated (04/10/2024)   Social Connection and Isolation Panel    Frequency of Communication with Friends and Family: More than three times a week    Frequency of Social Gatherings with Friends and Family: More than three times a week    Attends Religious Services: Never    Database Administrator or Organizations: No    Attends Banker Meetings: Never    Marital Status: Widowed  Intimate Partner Violence: Not At Risk  (04/10/2024)   Epic    Fear of Current or Ex-Partner: No    Emotionally Abused: No    Physically Abused: No    Sexually Abused: No  Depression (PHQ2-9): Low Risk (08/15/2024)   Depression (PHQ2-9)    PHQ-2 Score: 3  Recent Concern: Depression (PHQ2-9) - Medium Risk (07/10/2024)   Depression (PHQ2-9)    PHQ-2 Score: 9  Alcohol Screen: Low Risk (04/10/2024)   Alcohol Screen    Last Alcohol Screening Score (  AUDIT): 0  Housing: Low Risk (04/10/2024)   Epic    Unable to Pay for Housing in the Last Year: No    Number of Times Moved in the Last Year: 0    Homeless in the Last Year: No  Utilities: Not At Risk (04/10/2024)   Epic    Threatened with loss of utilities: No  Health Literacy: Adequate Health Literacy (04/10/2024)   B1300 Health Literacy    Frequency of need for help with medical instructions: Never    Outpatient Medications Prior to Visit  Medication Sig Dispense Refill   alendronate  (FOSAMAX ) 70 MG tablet Take 1 tablet (70 mg total) by mouth every 7 (seven) days. Take with a full glass of water on an empty stomach. 12 tablet 4   apixaban  (ELIQUIS ) 5 MG TABS tablet Take 1 tablet (5 mg total) by mouth 2 (two) times daily. NEEDS CARDIOLOGY APPT FOR REFILLS, CALL OFFICE [phone].  THANK YOU 60 tablet 0   atorvastatin  (LIPITOR) 40 MG tablet Take 1 tablet (40 mg total) by mouth daily. 90 tablet 1   Calcium  Carbonate-Vitamin D 600-400 MG-UNIT tablet Take 1 tablet by mouth 2 (two) times daily. Reported on 01/27/2016     cholecalciferol (VITAMIN D3) 25 MCG (1000 UNIT) tablet Take 1,000 Units by mouth daily.     Continuous Glucose Sensor (DEXCOM G7 SENSOR) MISC Apply 1 sensor every 10 days. 9 each 4   insulin  aspart (NOVOLOG ) 100 UNIT/ML FlexPen Inject 2-3 Units into the skin 3 (three) times daily before meals. 15 mL 3   insulin  degludec (TRESIBA  FLEXTOUCH) 100 UNIT/ML FlexTouch Pen Inject 4-5 Units into the skin daily. 9 mL 3   Insulin  Syringe-Needle U-100 (TRUEPLUS INSULIN  SYRINGE) 31G X  5/16 0.5 ML MISC USE TO INJECT INSULIN  4 TIMES A DAY 300 each 3   losartan  (COZAAR ) 25 MG tablet Take 0.5 tablets (12.5 mg total) by mouth daily. 45 tablet 1   metoprolol  succinate (TOPROL -XL) 25 MG 24 hr tablet Take 1 tablet (25 mg total) by mouth daily. 90 tablet 1   No facility-administered medications prior to visit.    Allergies[1]  ROS    See HPI Objective:    Physical Exam Constitutional:      General: He is not in acute distress.    Appearance: He is underweight.  HENT:     Head: Normocephalic and atraumatic.  Cardiovascular:     Rate and Rhythm: Normal rate and regular rhythm.     Heart sounds: No murmur heard. Pulmonary:     Effort: Pulmonary effort is normal. No respiratory distress.     Breath sounds: Normal breath sounds. No wheezing or rales.  Skin:    General: Skin is warm and dry.  Neurological:     Mental Status: He is alert and oriented to person, place, and time.  Psychiatric:        Behavior: Behavior normal.        Thought Content: Thought content normal.      BP (!) 92/49 (BP Location: Right Arm, Patient Position: Sitting, Cuff Size: Small)   Pulse 72   Temp 97.8 F (36.6 C) (Oral)   Resp 16   Ht 5' 9 (1.753 m)   Wt 128 lb 12.8 oz (58.4 kg)   SpO2 95%   BMI 19.02 kg/m  Wt Readings from Last 3 Encounters:  08/15/24 128 lb 12.8 oz (58.4 kg)  07/10/24 133 lb (60.3 kg)  04/10/24 150 lb (68 kg)  Assessment & Plan:   Problem List Items Addressed This Visit       Unprioritized   Weight loss - Primary   CT chest/abd/pelvis noted some non-specific mediastinal adenopathy back in 11/25, but felt most likely related to ILD- will plan to repeat in 3 months.  Otherwise no concerning findings for underlying malignancy.     Significant weight loss with decreased appetite and low energy. Recent fall likely due to hypoglycemia. Previous CT showed interstitial lung disease. Mild subclinical hypothyroidism noted last visit. Will repeat TFT's  today. Add trial of PPI to see if this helps with his early satiety given increased GERD symptoms.  Refer back to GI. Add trial of Megace  to stimulate appetite.  - Encouraged high-calorie soft snacks such as peanut butter sandwiches and peanut butter crackers (has poor dentition). - Advised increasing meal quantity as able. - Referred to GI specialist for evaluation of weight loss. - Repeated thyroid  function tests.      Relevant Medications   megestrol  (MEGACE ) 400 MG/10ML suspension   Other Relevant Orders   CBC w/Diff   Comp Met (CMET)   IPF (idiopathic pulmonary fibrosis) (HCC)   He is followed by pulmonology.       Hypothyroidism   Update tft's.       Relevant Medications   metoprolol  succinate (TOPROL -XL) 25 MG 24 hr tablet   HTN (hypertension)   BP is low- likely due to recent weight loss. Will cut toprol  xl to 1/2 tab once daily.       Relevant Medications   metoprolol  succinate (TOPROL -XL) 25 MG 24 hr tablet   Gastroesophageal reflux disease   Uncontrolled. Restart PPI.       Relevant Medications   omeprazole  (PRILOSEC) 40 MG capsule   Fall   Generalized weakness- likely due to poor po intake and weight loss.  He has some mild left hip tenderness but walking without difficulty and declines x-ray at this time. Encouraged use of cane to decrease fall risk.       Controlled diabetes mellitus with complication (HCC)   Lab Results  Component Value Date   HGBA1C 6.9 (A) 03/13/2024   HGBA1C 7.4 (A) 11/04/2023   HGBA1C 6.7 (A) 07/04/2023   Lab Results  Component Value Date   MICROALBUR 2.1 (H) 07/10/2024   LDLCALC 52 02/03/2024   CREATININE 0.85 07/27/2024   He has a dexcom- I will reach out to his endocrinologist to make sure he is not having any hypoglycemia in the setting of recent fall and weight loss.       Other Visit Diagnoses       Subclinical hypothyroidism       Relevant Medications   metoprolol  succinate (TOPROL -XL) 25 MG 24 hr tablet   Other  Relevant Orders   T4, free   T3, free   TSH      Assessment & Plan     I have changed Ryan [name]'s metoprolol  succinate. I am also having him start on omeprazole  and megestrol . Additionally, I am having him maintain his Calcium  Carbonate-Vitamin D, cholecalciferol, Insulin  Syringe-Needle U-100, alendronate , insulin  aspart, Tresiba  FlexTouch, atorvastatin , Dexcom G7 Sensor, losartan , and apixaban .  Meds ordered this encounter  Medications   omeprazole  (PRILOSEC) 40 MG capsule    Sig: Take 1 capsule (40 mg total) by mouth daily.    Dispense:  90 capsule    Refill:  1    Supervising Provider:   DOMENICA BLACKBIRD A [[id number]]   megestrol  (MEGACE ) 400  MG/10ML suspension    Sig: Take 10 mLs (400 mg total) by mouth daily.    Dispense:  240 mL    Refill:  0    Supervising Provider:   DOMENICA BLACKBIRD A [[id number]]   metoprolol  succinate (TOPROL -XL) 25 MG 24 hr tablet    Sig: Take 0.5 tablets (12.5 mg total) by mouth daily.    Supervising Provider:   DOMENICA BLACKBIRD A [[id number]]      [1]  Allergies Allergen Reactions   Other Other (See Comments)    Client states he became very sick and had significant skin inflammation and irritation with the Freestyle Libre sensor   Lisinopril      Cough    "

## 2024-08-15 NOTE — Assessment & Plan Note (Signed)
 Lab Results  Component Value Date   HGBA1C 6.9 (A) 03/13/2024   HGBA1C 7.4 (A) 11/04/2023   HGBA1C 6.7 (A) 07/04/2023   Lab Results  Component Value Date   MICROALBUR 2.1 (H) 07/10/2024   LDLCALC 52 02/03/2024   CREATININE 0.85 07/27/2024   He has a dexcom- I will reach out to his endocrinologist to make sure he is not having any hypoglycemia in the setting of recent fall and weight loss.

## 2024-08-15 NOTE — Patient Instructions (Signed)
" °  VISIT SUMMARY: During your visit, we discussed your recent weight loss, low energy, and other health concerns. We reviewed your current medications and made some adjustments to help manage your symptoms. We also planned some follow-up tests and referrals to specialists to further evaluate your condition.  YOUR PLAN: -UNINTENTIONAL WEIGHT LOSS: You have experienced significant weight loss, decreased appetite, and low energy. This could be related to your interstitial lung disease and other health issues. We have prescribed Mannose 10 mL once daily to help stimulate your appetite. Please try to include high-calorie snacks like peanut butter sandwiches and crackers in your diet and increase your meal quantity without causing discomfort. We have referred you to a gastrointestinal specialist to further evaluate your weight loss. We also sent a note to Dr. Arley regarding your weight loss and Dexcom readings. Your thyroid  function tests will be repeated to monitor your condition.  -GASTROESOPHAGEAL REFLUX DISEASE: You have been experiencing recurrent acid reflux symptoms. This condition occurs when stomach acid frequently flows back into the tube connecting your mouth and stomach. We have prescribed omeprazole  40 mg once daily to help manage these symptoms.  -PRIMARY HYPERTENSION: Your blood pressure has been low, possibly due to your current medication regimen. Hypertension is high blood pressure, but in your case, the medications might be causing it to drop too low. We have instructed you to cut your metoprolol  tablets in half and take half a tablet once daily. We will recheck your blood pressure at your next visit.  -SUBCLINICAL HYPOTHYROIDISM: Your thyroid  function tests are borderline low, which is unusual for someone experiencing weight loss. Hypothyroidism is when your thyroid  gland doesn't produce enough thyroid  hormone. We will repeat your thyroid  function tests to monitor this  condition.  INSTRUCTIONS: Please follow up with the gastrointestinal specialist as referred. We will recheck your blood pressure and repeat your thyroid  function tests at your next visit. Continue monitoring your blood sugar levels with your Dexcom device and report any significant changes.             "

## 2024-08-16 ENCOUNTER — Ambulatory Visit: Payer: Self-pay | Admitting: Family

## 2024-08-16 LAB — CBC WITH DIFFERENTIAL/PLATELET
Basophils Absolute: 0.1 K/uL (ref 0.0–0.1)
Basophils Relative: 1.2 % (ref 0.0–3.0)
Eosinophils Absolute: 0.3 K/uL (ref 0.0–0.7)
Eosinophils Relative: 4.2 % (ref 0.0–5.0)
HCT: 42.1 % (ref 39.0–52.0)
Hemoglobin: 14.1 g/dL (ref 13.0–17.0)
Lymphocytes Relative: 27.5 % (ref 12.0–46.0)
Lymphs Abs: 2 K/uL (ref 0.7–4.0)
MCHC: 33.5 g/dL (ref 30.0–36.0)
MCV: 89.9 fl (ref 78.0–100.0)
Monocytes Absolute: 1.3 K/uL — ABNORMAL HIGH (ref 0.1–1.0)
Monocytes Relative: 17.6 % — ABNORMAL HIGH (ref 3.0–12.0)
Neutro Abs: 3.5 K/uL (ref 1.4–7.7)
Neutrophils Relative %: 49.5 % (ref 43.0–77.0)
Platelets: 308 K/uL (ref 150.0–400.0)
RBC: 4.69 Mil/uL (ref 4.22–5.81)
RDW: 14.8 % (ref 11.5–15.5)
WBC: 7.2 K/uL (ref 4.0–10.5)

## 2024-08-16 LAB — COMPREHENSIVE METABOLIC PANEL WITH GFR
ALT: 10 U/L (ref 3–53)
AST: 14 U/L (ref 5–37)
Albumin: 3.5 g/dL (ref 3.5–5.2)
Alkaline Phosphatase: 78 U/L (ref 39–117)
BUN: 15 mg/dL (ref 6–23)
CO2: 32 meq/L (ref 19–32)
Calcium: 8.8 mg/dL (ref 8.4–10.5)
Chloride: 98 meq/L (ref 96–112)
Creatinine, Ser: 0.8 mg/dL (ref 0.40–1.50)
GFR: 84.91 mL/min
Glucose, Bld: 106 mg/dL — ABNORMAL HIGH (ref 70–99)
Potassium: 4.8 meq/L (ref 3.5–5.1)
Sodium: 136 meq/L (ref 135–145)
Total Bilirubin: 0.8 mg/dL (ref 0.2–1.2)
Total Protein: 6.9 g/dL (ref 6.0–8.3)

## 2024-08-16 LAB — TSH: TSH: 5.92 u[IU]/mL — ABNORMAL HIGH (ref 0.35–5.50)

## 2024-08-16 LAB — T3, FREE: T3, Free: 2.7 pg/mL (ref 2.3–4.2)

## 2024-08-16 LAB — T4, FREE: Free T4: 0.96 ng/dL (ref 0.60–1.60)

## 2024-08-20 ENCOUNTER — Other Ambulatory Visit (HOSPITAL_COMMUNITY): Payer: Self-pay

## 2024-08-30 ENCOUNTER — Ambulatory Visit: Admitting: Internal Medicine

## 2024-09-14 ENCOUNTER — Other Ambulatory Visit: Payer: Self-pay

## 2024-09-14 ENCOUNTER — Encounter (HOSPITAL_BASED_OUTPATIENT_CLINIC_OR_DEPARTMENT_OTHER): Payer: Self-pay

## 2024-09-14 ENCOUNTER — Emergency Department (HOSPITAL_BASED_OUTPATIENT_CLINIC_OR_DEPARTMENT_OTHER)

## 2024-09-14 ENCOUNTER — Emergency Department (HOSPITAL_BASED_OUTPATIENT_CLINIC_OR_DEPARTMENT_OTHER)
Admission: EM | Admit: 2024-09-14 | Source: Home / Self Care | Attending: Emergency Medicine | Admitting: Emergency Medicine

## 2024-09-14 DIAGNOSIS — J849 Interstitial pulmonary disease, unspecified: Secondary | ICD-10-CM

## 2024-09-14 DIAGNOSIS — J9621 Acute and chronic respiratory failure with hypoxia: Secondary | ICD-10-CM

## 2024-09-14 DIAGNOSIS — J9601 Acute respiratory failure with hypoxia: Secondary | ICD-10-CM | POA: Diagnosis present

## 2024-09-14 HISTORY — DX: Disorder of thyroid, unspecified: E07.9

## 2024-09-14 LAB — CBC WITH DIFFERENTIAL/PLATELET
Abs Immature Granulocytes: 0.04 10*3/uL (ref 0.00–0.07)
Basophils Absolute: 0 10*3/uL (ref 0.0–0.1)
Basophils Relative: 0 %
Eosinophils Absolute: 0.2 10*3/uL (ref 0.0–0.5)
Eosinophils Relative: 2 %
HCT: 44.5 % (ref 39.0–52.0)
Hemoglobin: 14.6 g/dL (ref 13.0–17.0)
Immature Granulocytes: 0 %
Lymphocytes Relative: 21 %
Lymphs Abs: 2.1 10*3/uL (ref 0.7–4.0)
MCH: 30 pg (ref 26.0–34.0)
MCHC: 32.8 g/dL (ref 30.0–36.0)
MCV: 91.4 fL (ref 80.0–100.0)
Monocytes Absolute: 1 10*3/uL (ref 0.1–1.0)
Monocytes Relative: 10 %
Neutro Abs: 6.6 10*3/uL (ref 1.7–7.7)
Neutrophils Relative %: 67 %
Platelets: 329 10*3/uL (ref 150–400)
RBC: 4.87 MIL/uL (ref 4.22–5.81)
RDW: 13.8 % (ref 11.5–15.5)
WBC: 10 10*3/uL (ref 4.0–10.5)
nRBC: 0 % (ref 0.0–0.2)

## 2024-09-14 LAB — COMPREHENSIVE METABOLIC PANEL WITH GFR
ALT: 10 U/L (ref 0–44)
AST: 19 U/L (ref 15–41)
Albumin: 4 g/dL (ref 3.5–5.0)
Alkaline Phosphatase: 108 U/L (ref 38–126)
Anion gap: 17 — ABNORMAL HIGH (ref 5–15)
BUN: 18 mg/dL (ref 8–23)
CO2: 22 mmol/L (ref 22–32)
Calcium: 9.5 mg/dL (ref 8.9–10.3)
Chloride: 98 mmol/L (ref 98–111)
Creatinine, Ser: 1.02 mg/dL (ref 0.61–1.24)
GFR, Estimated: >60 mL/min
Glucose, Bld: 203 mg/dL — ABNORMAL HIGH (ref 70–99)
Potassium: 4.1 mmol/L (ref 3.5–5.1)
Sodium: 137 mmol/L (ref 135–145)
Total Bilirubin: 1.5 mg/dL — ABNORMAL HIGH (ref 0.0–1.2)
Total Protein: 7.8 g/dL (ref 6.5–8.1)

## 2024-09-14 LAB — I-STAT VENOUS BLOOD GAS, ED
Acid-base deficit: 2 mmol/L (ref 0.0–2.0)
Bicarbonate: 24.8 mmol/L (ref 20.0–28.0)
Calcium, Ion: 1.16 mmol/L (ref 1.15–1.40)
HCT: 45 % (ref 39.0–52.0)
Hemoglobin: 15.3 g/dL (ref 13.0–17.0)
O2 Saturation: 35 %
Patient temperature: 97.4
Potassium: 3.9 mmol/L (ref 3.5–5.1)
Sodium: 138 mmol/L (ref 135–145)
TCO2: 26 mmol/L (ref 22–32)
pCO2, Ven: 45.8 mmHg (ref 44–60)
pH, Ven: 7.339 (ref 7.25–7.43)
pO2, Ven: 22 mmHg — CL (ref 32–45)

## 2024-09-14 LAB — PROTIME-INR
INR: 1.1 (ref 0.8–1.2)
Prothrombin Time: 14.9 s (ref 11.4–15.2)

## 2024-09-14 LAB — TROPONIN T, HIGH SENSITIVITY
Troponin T High Sensitivity: 27 ng/L — ABNORMAL HIGH (ref 0–19)
Troponin T High Sensitivity: 29 ng/L — ABNORMAL HIGH (ref 0–19)

## 2024-09-14 LAB — CULTURE, BLOOD (ROUTINE X 2)
Special Requests: ADEQUATE
Special Requests: ADEQUATE

## 2024-09-14 LAB — RESP PANEL BY RT-PCR (RSV, FLU A&B, COVID)  RVPGX2
Influenza A by PCR: NEGATIVE
Influenza B by PCR: NEGATIVE
Resp Syncytial Virus by PCR: NEGATIVE
SARS Coronavirus 2 by RT PCR: NEGATIVE

## 2024-09-14 LAB — LACTIC ACID, PLASMA
Lactic Acid, Venous: 4.1 mmol/L (ref 0.5–1.9)
Lactic Acid, Venous: 3 mmol/L (ref 0.5–1.9)

## 2024-09-14 LAB — PRO BRAIN NATRIURETIC PEPTIDE: Pro Brain Natriuretic Peptide: 368 pg/mL — ABNORMAL HIGH

## 2024-09-14 LAB — MAGNESIUM: Magnesium: 2.1 mg/dL (ref 1.7–2.4)

## 2024-09-14 LAB — CBG MONITORING, ED: Glucose-Capillary: 213 mg/dL — ABNORMAL HIGH (ref 70–99)

## 2024-09-14 MED ORDER — IPRATROPIUM-ALBUTEROL 0.5-2.5 (3) MG/3ML IN SOLN
3.0000 mL | Freq: Once | RESPIRATORY_TRACT | Status: AC
  Administered 2024-09-14: 3 mL via RESPIRATORY_TRACT
  Filled 2024-09-14: qty 3

## 2024-09-14 MED ORDER — LACTATED RINGERS IV SOLN: INTRAVENOUS | Status: AC

## 2024-09-14 MED ORDER — LACTATED RINGERS IV BOLUS
500.0000 mL | Freq: Once | INTRAVENOUS | Status: AC
  Administered 2024-09-14: 500 mL via INTRAVENOUS

## 2024-09-14 NOTE — ED Notes (Signed)
 Pt taking fluids by mouth at this time as well.  Pt notes he is feeling somewhat better, he denies any pain at this time. Pt was encouraged to give UA sample he is aware of same and will attempt to do so.

## 2024-09-14 NOTE — ED Notes (Signed)
 Gilmer Rush, [phone] and daughter in law, [place], [phone].

## 2024-09-14 NOTE — ED Notes (Signed)
 Spoke with Alisa at [facility] for cardiac monitoring

## 2024-09-14 NOTE — ED Notes (Signed)
 Pt has been losing weight for months, and hasn't eaten in two days due to no appetite.

## 2024-09-14 NOTE — ED Notes (Signed)
 Pt came in for SOB and extended weight loss, failure to thrive. No acute diagnoses recently, but has not been doing well at home. Family convinced him to come in for evaluation.

## 2024-09-14 NOTE — Progress Notes (Incomplete)
 Hospitalist Transfer Note:    Nursing staff, Please call [facility] Admits & Consults System-Wide number on Amion [phone]) as soon as patient's arrival, so appropriate admitting provider can evaluate the pt.  Transferring facility: [facility] Requesting provider: Arthor Captain, PA (EDP at [facility]) Reason for transfer: admission for further evaluation and management of acute diverticulitis in the setting of failed outpatient antibiotics.     79 year old male  who presented to [facility] ED complaining of 3 weeks of progressive lower abdominal discomfort.  Approximately 1 week ago he had presented to his PCP with complaints of lower abdominal discomfort, at which time PCP was able to arrange for outpatient CT abdomen/pelvis which was suggestive of acute diverticulitis.  The patient subsequently completed a course of Cipro/Flagyl, but is noted further ensuing progression of his lower abdominal discomfort, prompting him to present to [facility] this evening.  Vital signs in the ED were notable for the following: Afebrile, heart rates in the 60s to 80s; systolic blood pressures in the 110s to 130s mmHg.   Imaging in the ED today notable for CT abdomen/pelvis with contrast, which showed acute diverticulitis without evidence of obstruction, perforation, or abscess.  Medications administered prior to transfer included the following: Zosyn   Subsequently, I accepted this patient for transfer for inpatient admission to a med/tele bed at [facility] or [facility]  (first available) for further work-up and management of the above.        Newton Pigg, DO Hospitalist

## 2024-09-14 NOTE — ED Provider Notes (Incomplete)
 " [place] [facility] Provider Note   CSN: [id number] Arrival date & time: 09/14/24  1710     Patient presents with: Shortness of Breath and Weight Loss   Ryan [name] is a 79 y.o. male.  {Add pertinent medical, surgical, social history, OB history to HPI:32947} HPI Patient reports known history of interstitial lung disease.  He reports it has been progressive but he has been much more short of breath fairly recently over the past few days.  Patient denies that he has home oxygen.  He denies he had a fever.  Reports he does get productive cough sometimes when he gets in a real harsh coughing episode.  No focal chest pain.  Patient reports he has had 60 pounds of weight loss over the past 6 months.  Patient reports that he is not having any vomiting or diarrhea but has a really difficult time eating or staying hydrated.  He denies abdominal pain.    Prior to Admission medications  Medication Sig Start Date End Date Taking? Authorizing Provider  alendronate  (FOSAMAX ) 70 MG tablet Take 1 tablet (70 mg total) by mouth every 7 (seven) days. Take with a full glass of water on an empty stomach. 09/23/23   [name], [name], NP  apixaban  (ELIQUIS ) 5 MG TABS tablet Take 1 tablet (5 mg total) by mouth 2 (two) times daily. NEEDS CARDIOLOGY APPT FOR REFILLS, CALL OFFICE [phone].  THANK YOU 08/07/24   Okey Vina GAILS, MD  atorvastatin  (LIPITOR) 40 MG tablet Take 1 tablet (40 mg total) by mouth daily. 03/20/24   [name], [name], NP  Calcium  Carbonate-Vitamin D 600-400 MG-UNIT tablet Take 1 tablet by mouth 2 (two) times daily. Reported on 01/27/2016    [provider]  cholecalciferol (VITAMIN D3) 25 MCG (1000 UNIT) tablet Take 1,000 Units by mouth daily.    [provider]  Continuous Glucose Sensor (DEXCOM G7 SENSOR) MISC Apply 1 sensor every 10 days. 05/15/24   Trixie File, MD  insulin  aspart (NOVOLOG ) 100 UNIT/ML FlexPen Inject 2-3 Units  into the skin 3 (three) times daily before meals. 11/04/23   Trixie File, MD  insulin  degludec (TRESIBA  FLEXTOUCH) 100 UNIT/ML FlexTouch Pen Inject 4-5 Units into the skin daily. 11/04/23   Trixie File, MD  Insulin  Syringe-Needle U-100 (TRUEPLUS INSULIN  SYRINGE) 31G X 5/16 0.5 ML MISC USE TO INJECT INSULIN  4 TIMES A DAY 03/18/21   Trixie File, MD  losartan  (COZAAR ) 25 MG tablet Take 0.5 tablets (12.5 mg total) by mouth daily. 07/13/24   [name], [name], NP  megestrol  (MEGACE ) 400 MG/10ML suspension Take 10 mLs (400 mg total) by mouth daily. 08/15/24   [name], [name], NP  metoprolol  succinate (TOPROL -XL) 25 MG 24 hr tablet Take 0.5 tablets (12.5 mg total) by mouth daily. 08/15/24   [name], [name], NP  omeprazole  (PRILOSEC) 40 MG capsule Take 1 capsule (40 mg total) by mouth daily. 08/15/24   [name], [name], NP    Allergies: Other and Lisinopril     Review of Systems  Updated Vital Signs BP 103/81 (BP Location: Left Arm)   Pulse (!) 132   Temp (!) 97.4 F (36.3 C) (Oral)   Resp (!) 40   SpO2 92%   Physical Exam Constitutional:      Comments: Alert.  Very tachypneic.  Speaking in short phrases.  Cachectic chronic ill appearance  HENT:     Mouth/Throat:     Pharynx: Oropharynx is clear.  Cardiovascular:     Rate and  Rhythm: Normal rate and regular rhythm.  Pulmonary:     Comments: Tachypnea.  Breath sounds are symmetric.  Breath sounds are somewhat soft but no significant rhonchi.  Occasional fine crackle more upper lung fields than lower. Abdominal:     Comments: Abdomen is scaphoid, nontender, no guarding  Musculoskeletal:     Comments: Significant muscular atrophy.  No peripheral edema.  Skin:    General: Skin is warm and dry.     Coloration: Skin is pale.  Neurological:     General: No focal deficit present.     Mental Status: He is oriented to person, place, and time.     (all labs ordered are listed, but only abnormal results are  displayed) Labs Reviewed  CULTURE, BLOOD (ROUTINE X 2)  CULTURE, BLOOD (ROUTINE X 2)  RESP PANEL BY RT-PCR (RSV, FLU A&B, COVID)  [id number]  COMPREHENSIVE METABOLIC PANEL WITH GFR  LACTIC ACID, PLASMA  LACTIC ACID, PLASMA  CBC WITH DIFFERENTIAL/PLATELET  PROTIME-INR  URINALYSIS, W/ REFLEX TO CULTURE (INFECTION SUSPECTED)  PRO BRAIN NATRIURETIC PEPTIDE  MAGNESIUM   I-STAT VENOUS BLOOD GAS, ED  TROPONIN T, HIGH SENSITIVITY    EKG: EKG Interpretation Date/Time:  Friday September 14 2024 17:32:20 EST Ventricular Rate:  88 PR Interval:  160 QRS Duration:  105 QT Interval:  357 QTC Calculation: 432 R Axis:   -58  Text Interpretation: Sinus rhythm LAD, consider left anterior fascicular block Left ventricular hypertrophy ST elevation, consider inferior injury Baseline wander in lead(s) V1 peaked t waves more pronounced Confirmed by Armenta Canning [phone]) on 09/14/2024 5:39:26 PM  Radiology: No results found.  {Document cardiac monitor, telemetry assessment procedure when appropriate:32947} Procedures   Medications Ordered in the ED - No data to display    {Click here for ABCD2, HEART and other calculators REFRESH Note before signing:1}                              Medical Decision Making Amount and/or Complexity of Data Reviewed Labs: ordered. Radiology: ordered.  Risk Prescription drug management. Decision regarding hospitalization.   Consult: Dr. Doc for admission [facility] hospitalist  {Document critical care time when appropriate  Document review of labs and clinical decision tools ie CHADS2VASC2, etc  Document your independent review of radiology images and any outside records  Document your discussion with family members, caretakers and with consultants  Document social determinants of health affecting pt's care  Document your decision making why or why not admission, treatments were needed:32947:::1}   Final diagnoses:  None    ED Discharge Orders     None         "

## 2024-09-14 NOTE — ED Triage Notes (Signed)
 Reports SHOB at rest, productive cough, 60 lb weight loss, fatigue for multiple months. Family states symptoms worsened over the last 3 days   Increased WOB, RRT in triage

## 2024-09-21 ENCOUNTER — Ambulatory Visit: Admitting: Family

## 2024-09-24 ENCOUNTER — Ambulatory Visit: Admitting: Gastroenterology

## 2025-04-11 ENCOUNTER — Ambulatory Visit
